# Patient Record
Sex: Male | Born: 1945
Health system: Southern US, Community
[De-identification: ages and names within clinical notes are randomized; demographics above are authoritative.]

## PROBLEM LIST (undated history)

## (undated) DIAGNOSIS — F32A Depression, unspecified: Secondary | ICD-10-CM

## (undated) DIAGNOSIS — I10 Essential (primary) hypertension: Secondary | ICD-10-CM

## (undated) DIAGNOSIS — J449 Chronic obstructive pulmonary disease, unspecified: Secondary | ICD-10-CM

## (undated) DIAGNOSIS — I255 Ischemic cardiomyopathy: Secondary | ICD-10-CM

## (undated) DIAGNOSIS — F329 Major depressive disorder, single episode, unspecified: Secondary | ICD-10-CM

## (undated) DIAGNOSIS — E119 Type 2 diabetes mellitus without complications: Secondary | ICD-10-CM

## (undated) DIAGNOSIS — Z951 Presence of aortocoronary bypass graft: Secondary | ICD-10-CM

## (undated) DIAGNOSIS — J90 Pleural effusion, not elsewhere classified: Secondary | ICD-10-CM

## (undated) DIAGNOSIS — I2510 Atherosclerotic heart disease of native coronary artery without angina pectoris: Secondary | ICD-10-CM

## (undated) HISTORY — PX: APPENDECTOMY: SHX54

## (undated) HISTORY — PX: CHOLECYSTECTOMY: SHX55

## (undated) HISTORY — PX: GASTRIC BYPASS: SHX52

## (undated) HISTORY — PX: TOE AMPUTATION: SHX809

## (undated) HISTORY — PX: CORONARY ARTERY BYPASS GRAFT: SHX141

---

## 1999-09-18 ENCOUNTER — Ambulatory Visit (HOSPITAL_BASED_OUTPATIENT_CLINIC_OR_DEPARTMENT_OTHER): Admission: RE | Admit: 1999-09-18 | Discharge: 1999-09-18 | Payer: Self-pay | Admitting: *Deleted

## 2000-01-22 ENCOUNTER — Ambulatory Visit (HOSPITAL_BASED_OUTPATIENT_CLINIC_OR_DEPARTMENT_OTHER): Admission: RE | Admit: 2000-01-22 | Discharge: 2000-01-22 | Payer: Self-pay | Admitting: Plastic Surgery

## 2000-10-14 ENCOUNTER — Encounter: Admission: RE | Admit: 2000-10-14 | Discharge: 2000-10-14 | Payer: Self-pay | Admitting: *Deleted

## 2000-10-14 ENCOUNTER — Encounter: Payer: Self-pay | Admitting: *Deleted

## 2002-11-02 ENCOUNTER — Encounter: Payer: Self-pay | Admitting: Urology

## 2002-11-02 ENCOUNTER — Ambulatory Visit (HOSPITAL_BASED_OUTPATIENT_CLINIC_OR_DEPARTMENT_OTHER): Admission: RE | Admit: 2002-11-02 | Discharge: 2002-11-02 | Payer: Self-pay | Admitting: Urology

## 2007-09-10 DIAGNOSIS — I2510 Atherosclerotic heart disease of native coronary artery without angina pectoris: Secondary | ICD-10-CM

## 2007-09-10 HISTORY — DX: Atherosclerotic heart disease of native coronary artery without angina pectoris: I25.10

## 2014-07-06 DIAGNOSIS — Z860100 Personal history of colon polyps, unspecified: Secondary | ICD-10-CM | POA: Insufficient documentation

## 2014-07-06 DIAGNOSIS — K760 Fatty (change of) liver, not elsewhere classified: Secondary | ICD-10-CM | POA: Insufficient documentation

## 2014-07-06 DIAGNOSIS — G4700 Insomnia, unspecified: Secondary | ICD-10-CM | POA: Insufficient documentation

## 2014-07-06 DIAGNOSIS — I10 Essential (primary) hypertension: Secondary | ICD-10-CM | POA: Insufficient documentation

## 2014-07-06 DIAGNOSIS — K219 Gastro-esophageal reflux disease without esophagitis: Secondary | ICD-10-CM | POA: Insufficient documentation

## 2014-07-06 DIAGNOSIS — M2040 Other hammer toe(s) (acquired), unspecified foot: Secondary | ICD-10-CM | POA: Insufficient documentation

## 2014-07-06 DIAGNOSIS — I2510 Atherosclerotic heart disease of native coronary artery without angina pectoris: Secondary | ICD-10-CM | POA: Insufficient documentation

## 2014-07-06 DIAGNOSIS — Z9884 Bariatric surgery status: Secondary | ICD-10-CM | POA: Insufficient documentation

## 2014-07-07 DIAGNOSIS — F339 Major depressive disorder, recurrent, unspecified: Secondary | ICD-10-CM | POA: Insufficient documentation

## 2014-07-07 DIAGNOSIS — E119 Type 2 diabetes mellitus without complications: Secondary | ICD-10-CM | POA: Insufficient documentation

## 2014-07-07 DIAGNOSIS — E291 Testicular hypofunction: Secondary | ICD-10-CM | POA: Insufficient documentation

## 2014-07-07 DIAGNOSIS — E113499 Type 2 diabetes mellitus with severe nonproliferative diabetic retinopathy without macular edema, unspecified eye: Secondary | ICD-10-CM | POA: Insufficient documentation

## 2014-07-07 DIAGNOSIS — M1990 Unspecified osteoarthritis, unspecified site: Secondary | ICD-10-CM | POA: Insufficient documentation

## 2014-07-07 DIAGNOSIS — R809 Proteinuria, unspecified: Secondary | ICD-10-CM

## 2014-07-07 DIAGNOSIS — D509 Iron deficiency anemia, unspecified: Secondary | ICD-10-CM | POA: Insufficient documentation

## 2014-07-07 DIAGNOSIS — E1142 Type 2 diabetes mellitus with diabetic polyneuropathy: Secondary | ICD-10-CM | POA: Insufficient documentation

## 2014-07-07 DIAGNOSIS — E1129 Type 2 diabetes mellitus with other diabetic kidney complication: Secondary | ICD-10-CM | POA: Insufficient documentation

## 2014-07-07 DIAGNOSIS — I739 Peripheral vascular disease, unspecified: Secondary | ICD-10-CM | POA: Insufficient documentation

## 2014-07-07 DIAGNOSIS — Z89439 Acquired absence of unspecified foot: Secondary | ICD-10-CM | POA: Insufficient documentation

## 2015-05-12 DIAGNOSIS — E785 Hyperlipidemia, unspecified: Secondary | ICD-10-CM | POA: Insufficient documentation

## 2016-01-18 DIAGNOSIS — M6702 Short Achilles tendon (acquired), left ankle: Secondary | ICD-10-CM | POA: Insufficient documentation

## 2016-05-16 DIAGNOSIS — L97509 Non-pressure chronic ulcer of other part of unspecified foot with unspecified severity: Secondary | ICD-10-CM

## 2016-05-16 DIAGNOSIS — E11621 Type 2 diabetes mellitus with foot ulcer: Secondary | ICD-10-CM | POA: Insufficient documentation

## 2016-09-11 DIAGNOSIS — M2022 Hallux rigidus, left foot: Secondary | ICD-10-CM | POA: Insufficient documentation

## 2016-12-11 DIAGNOSIS — M216X2 Other acquired deformities of left foot: Secondary | ICD-10-CM | POA: Insufficient documentation

## 2017-04-28 DIAGNOSIS — Z951 Presence of aortocoronary bypass graft: Secondary | ICD-10-CM | POA: Insufficient documentation

## 2017-08-30 DIAGNOSIS — F411 Generalized anxiety disorder: Secondary | ICD-10-CM | POA: Diagnosis not present

## 2017-09-03 DIAGNOSIS — E559 Vitamin D deficiency, unspecified: Secondary | ICD-10-CM | POA: Diagnosis not present

## 2017-09-03 DIAGNOSIS — E291 Testicular hypofunction: Secondary | ICD-10-CM | POA: Diagnosis not present

## 2017-09-03 DIAGNOSIS — E119 Type 2 diabetes mellitus without complications: Secondary | ICD-10-CM | POA: Diagnosis not present

## 2017-09-05 DIAGNOSIS — E1142 Type 2 diabetes mellitus with diabetic polyneuropathy: Secondary | ICD-10-CM | POA: Diagnosis not present

## 2017-09-05 DIAGNOSIS — E113499 Type 2 diabetes mellitus with severe nonproliferative diabetic retinopathy without macular edema, unspecified eye: Secondary | ICD-10-CM | POA: Diagnosis not present

## 2017-09-05 DIAGNOSIS — E1129 Type 2 diabetes mellitus with other diabetic kidney complication: Secondary | ICD-10-CM | POA: Diagnosis not present

## 2017-09-05 DIAGNOSIS — Z23 Encounter for immunization: Secondary | ICD-10-CM | POA: Diagnosis not present

## 2017-09-05 DIAGNOSIS — I2510 Atherosclerotic heart disease of native coronary artery without angina pectoris: Secondary | ICD-10-CM | POA: Diagnosis not present

## 2017-09-09 DIAGNOSIS — E113513 Type 2 diabetes mellitus with proliferative diabetic retinopathy with macular edema, bilateral: Secondary | ICD-10-CM | POA: Diagnosis not present

## 2017-10-01 DIAGNOSIS — H43811 Vitreous degeneration, right eye: Secondary | ICD-10-CM | POA: Diagnosis not present

## 2017-10-01 DIAGNOSIS — H4313 Vitreous hemorrhage, bilateral: Secondary | ICD-10-CM | POA: Diagnosis not present

## 2017-10-01 DIAGNOSIS — H35373 Puckering of macula, bilateral: Secondary | ICD-10-CM | POA: Diagnosis not present

## 2017-10-01 DIAGNOSIS — E113513 Type 2 diabetes mellitus with proliferative diabetic retinopathy with macular edema, bilateral: Secondary | ICD-10-CM | POA: Diagnosis not present

## 2017-10-08 DIAGNOSIS — H4313 Vitreous hemorrhage, bilateral: Secondary | ICD-10-CM | POA: Diagnosis not present

## 2017-10-08 DIAGNOSIS — H35373 Puckering of macula, bilateral: Secondary | ICD-10-CM | POA: Diagnosis not present

## 2017-10-08 DIAGNOSIS — E113513 Type 2 diabetes mellitus with proliferative diabetic retinopathy with macular edema, bilateral: Secondary | ICD-10-CM | POA: Diagnosis not present

## 2017-10-17 DIAGNOSIS — H3589 Other specified retinal disorders: Secondary | ICD-10-CM | POA: Diagnosis not present

## 2017-10-17 DIAGNOSIS — H35372 Puckering of macula, left eye: Secondary | ICD-10-CM | POA: Diagnosis not present

## 2017-10-17 DIAGNOSIS — E113513 Type 2 diabetes mellitus with proliferative diabetic retinopathy with macular edema, bilateral: Secondary | ICD-10-CM | POA: Diagnosis not present

## 2017-10-17 DIAGNOSIS — H35353 Cystoid macular degeneration, bilateral: Secondary | ICD-10-CM | POA: Diagnosis not present

## 2017-10-17 DIAGNOSIS — E113512 Type 2 diabetes mellitus with proliferative diabetic retinopathy with macular edema, left eye: Secondary | ICD-10-CM | POA: Diagnosis not present

## 2017-10-22 DIAGNOSIS — Z79899 Other long term (current) drug therapy: Secondary | ICD-10-CM | POA: Diagnosis not present

## 2017-10-22 DIAGNOSIS — M7742 Metatarsalgia, left foot: Secondary | ICD-10-CM | POA: Diagnosis not present

## 2017-10-22 DIAGNOSIS — D539 Nutritional anemia, unspecified: Secondary | ICD-10-CM | POA: Diagnosis not present

## 2017-10-22 DIAGNOSIS — L639 Alopecia areata, unspecified: Secondary | ICD-10-CM | POA: Diagnosis not present

## 2017-10-22 DIAGNOSIS — M6702 Short Achilles tendon (acquired), left ankle: Secondary | ICD-10-CM | POA: Diagnosis not present

## 2017-10-22 DIAGNOSIS — L089 Local infection of the skin and subcutaneous tissue, unspecified: Secondary | ICD-10-CM | POA: Diagnosis not present

## 2017-10-22 DIAGNOSIS — L309 Dermatitis, unspecified: Secondary | ICD-10-CM | POA: Diagnosis not present

## 2017-10-22 DIAGNOSIS — L84 Corns and callosities: Secondary | ICD-10-CM | POA: Diagnosis not present

## 2017-10-22 DIAGNOSIS — Z5181 Encounter for therapeutic drug level monitoring: Secondary | ICD-10-CM | POA: Diagnosis not present

## 2017-10-22 DIAGNOSIS — M7741 Metatarsalgia, right foot: Secondary | ICD-10-CM | POA: Diagnosis not present

## 2017-10-23 DIAGNOSIS — L84 Corns and callosities: Secondary | ICD-10-CM | POA: Insufficient documentation

## 2017-10-23 DIAGNOSIS — M7742 Metatarsalgia, left foot: Secondary | ICD-10-CM

## 2017-10-23 DIAGNOSIS — M7741 Metatarsalgia, right foot: Secondary | ICD-10-CM | POA: Insufficient documentation

## 2017-11-25 DIAGNOSIS — F411 Generalized anxiety disorder: Secondary | ICD-10-CM | POA: Diagnosis not present

## 2017-12-16 DIAGNOSIS — B9689 Other specified bacterial agents as the cause of diseases classified elsewhere: Secondary | ICD-10-CM | POA: Diagnosis not present

## 2017-12-16 DIAGNOSIS — R05 Cough: Secondary | ICD-10-CM | POA: Diagnosis not present

## 2017-12-16 DIAGNOSIS — E119 Type 2 diabetes mellitus without complications: Secondary | ICD-10-CM | POA: Diagnosis not present

## 2017-12-16 DIAGNOSIS — J209 Acute bronchitis, unspecified: Secondary | ICD-10-CM | POA: Diagnosis not present

## 2017-12-16 DIAGNOSIS — R918 Other nonspecific abnormal finding of lung field: Secondary | ICD-10-CM | POA: Diagnosis not present

## 2017-12-16 DIAGNOSIS — J9801 Acute bronchospasm: Secondary | ICD-10-CM | POA: Diagnosis not present

## 2017-12-16 DIAGNOSIS — I493 Ventricular premature depolarization: Secondary | ICD-10-CM | POA: Diagnosis not present

## 2017-12-16 DIAGNOSIS — R000 Tachycardia, unspecified: Secondary | ICD-10-CM | POA: Diagnosis not present

## 2017-12-16 DIAGNOSIS — I4589 Other specified conduction disorders: Secondary | ICD-10-CM | POA: Diagnosis not present

## 2017-12-16 DIAGNOSIS — I2510 Atherosclerotic heart disease of native coronary artery without angina pectoris: Secondary | ICD-10-CM | POA: Diagnosis not present

## 2017-12-16 DIAGNOSIS — I454 Nonspecific intraventricular block: Secondary | ICD-10-CM | POA: Diagnosis not present

## 2017-12-16 DIAGNOSIS — R0989 Other specified symptoms and signs involving the circulatory and respiratory systems: Secondary | ICD-10-CM | POA: Diagnosis not present

## 2017-12-16 DIAGNOSIS — Z951 Presence of aortocoronary bypass graft: Secondary | ICD-10-CM | POA: Diagnosis not present

## 2017-12-16 DIAGNOSIS — E113593 Type 2 diabetes mellitus with proliferative diabetic retinopathy without macular edema, bilateral: Secondary | ICD-10-CM | POA: Diagnosis not present

## 2017-12-16 DIAGNOSIS — F329 Major depressive disorder, single episode, unspecified: Secondary | ICD-10-CM | POA: Diagnosis not present

## 2017-12-16 DIAGNOSIS — Z79899 Other long term (current) drug therapy: Secondary | ICD-10-CM | POA: Diagnosis not present

## 2017-12-16 DIAGNOSIS — I10 Essential (primary) hypertension: Secondary | ICD-10-CM | POA: Diagnosis not present

## 2017-12-16 DIAGNOSIS — R0602 Shortness of breath: Secondary | ICD-10-CM | POA: Diagnosis not present

## 2017-12-16 DIAGNOSIS — Z9884 Bariatric surgery status: Secondary | ICD-10-CM | POA: Diagnosis not present

## 2017-12-16 DIAGNOSIS — Z7984 Long term (current) use of oral hypoglycemic drugs: Secondary | ICD-10-CM | POA: Diagnosis not present

## 2017-12-16 DIAGNOSIS — I25810 Atherosclerosis of coronary artery bypass graft(s) without angina pectoris: Secondary | ICD-10-CM | POA: Diagnosis not present

## 2017-12-16 DIAGNOSIS — E7800 Pure hypercholesterolemia, unspecified: Secondary | ICD-10-CM | POA: Diagnosis not present

## 2017-12-16 DIAGNOSIS — R7989 Other specified abnormal findings of blood chemistry: Secondary | ICD-10-CM | POA: Diagnosis not present

## 2017-12-16 DIAGNOSIS — R748 Abnormal levels of other serum enzymes: Secondary | ICD-10-CM | POA: Diagnosis not present

## 2017-12-16 DIAGNOSIS — Z7982 Long term (current) use of aspirin: Secondary | ICD-10-CM | POA: Diagnosis not present

## 2017-12-16 DIAGNOSIS — R778 Other specified abnormalities of plasma proteins: Secondary | ICD-10-CM | POA: Diagnosis not present

## 2017-12-16 DIAGNOSIS — J40 Bronchitis, not specified as acute or chronic: Secondary | ICD-10-CM | POA: Diagnosis not present

## 2017-12-16 DIAGNOSIS — I252 Old myocardial infarction: Secondary | ICD-10-CM | POA: Diagnosis not present

## 2017-12-16 DIAGNOSIS — J189 Pneumonia, unspecified organism: Secondary | ICD-10-CM | POA: Diagnosis not present

## 2017-12-16 DIAGNOSIS — R079 Chest pain, unspecified: Secondary | ICD-10-CM | POA: Diagnosis not present

## 2017-12-25 DIAGNOSIS — J189 Pneumonia, unspecified organism: Secondary | ICD-10-CM | POA: Diagnosis not present

## 2017-12-25 DIAGNOSIS — R918 Other nonspecific abnormal finding of lung field: Secondary | ICD-10-CM | POA: Diagnosis not present

## 2017-12-25 DIAGNOSIS — J9801 Acute bronchospasm: Secondary | ICD-10-CM | POA: Diagnosis not present

## 2017-12-25 DIAGNOSIS — R05 Cough: Secondary | ICD-10-CM | POA: Diagnosis not present

## 2018-01-01 DIAGNOSIS — J189 Pneumonia, unspecified organism: Secondary | ICD-10-CM | POA: Diagnosis not present

## 2018-01-01 DIAGNOSIS — J9801 Acute bronchospasm: Secondary | ICD-10-CM | POA: Diagnosis not present

## 2018-01-01 DIAGNOSIS — E119 Type 2 diabetes mellitus without complications: Secondary | ICD-10-CM | POA: Diagnosis not present

## 2018-01-06 DIAGNOSIS — E1142 Type 2 diabetes mellitus with diabetic polyneuropathy: Secondary | ICD-10-CM | POA: Diagnosis not present

## 2018-01-06 DIAGNOSIS — E119 Type 2 diabetes mellitus without complications: Secondary | ICD-10-CM | POA: Diagnosis not present

## 2018-01-06 DIAGNOSIS — I2510 Atherosclerotic heart disease of native coronary artery without angina pectoris: Secondary | ICD-10-CM | POA: Diagnosis not present

## 2018-01-06 DIAGNOSIS — I10 Essential (primary) hypertension: Secondary | ICD-10-CM | POA: Diagnosis not present

## 2018-01-07 DIAGNOSIS — H35353 Cystoid macular degeneration, bilateral: Secondary | ICD-10-CM | POA: Diagnosis not present

## 2018-01-07 DIAGNOSIS — T378X5D Adverse effect of other specified systemic anti-infectives and antiparasitics, subsequent encounter: Secondary | ICD-10-CM | POA: Diagnosis not present

## 2018-01-07 DIAGNOSIS — H35372 Puckering of macula, left eye: Secondary | ICD-10-CM | POA: Diagnosis not present

## 2018-01-07 DIAGNOSIS — H43813 Vitreous degeneration, bilateral: Secondary | ICD-10-CM | POA: Diagnosis not present

## 2018-01-08 DIAGNOSIS — R05 Cough: Secondary | ICD-10-CM | POA: Diagnosis not present

## 2018-01-08 DIAGNOSIS — J189 Pneumonia, unspecified organism: Secondary | ICD-10-CM | POA: Diagnosis not present

## 2018-01-15 DIAGNOSIS — I10 Essential (primary) hypertension: Secondary | ICD-10-CM | POA: Diagnosis not present

## 2018-01-15 DIAGNOSIS — I2510 Atherosclerotic heart disease of native coronary artery without angina pectoris: Secondary | ICD-10-CM | POA: Diagnosis not present

## 2018-01-15 DIAGNOSIS — E785 Hyperlipidemia, unspecified: Secondary | ICD-10-CM | POA: Diagnosis not present

## 2018-01-15 DIAGNOSIS — Z951 Presence of aortocoronary bypass graft: Secondary | ICD-10-CM | POA: Diagnosis not present

## 2018-01-28 DIAGNOSIS — L639 Alopecia areata, unspecified: Secondary | ICD-10-CM | POA: Diagnosis not present

## 2018-01-28 DIAGNOSIS — Z79899 Other long term (current) drug therapy: Secondary | ICD-10-CM | POA: Diagnosis not present

## 2018-01-28 DIAGNOSIS — Z5181 Encounter for therapeutic drug level monitoring: Secondary | ICD-10-CM | POA: Diagnosis not present

## 2018-01-28 DIAGNOSIS — L089 Local infection of the skin and subcutaneous tissue, unspecified: Secondary | ICD-10-CM | POA: Diagnosis not present

## 2018-02-17 DIAGNOSIS — F411 Generalized anxiety disorder: Secondary | ICD-10-CM | POA: Diagnosis not present

## 2018-02-18 DIAGNOSIS — R509 Fever, unspecified: Secondary | ICD-10-CM | POA: Diagnosis not present

## 2018-02-18 DIAGNOSIS — R05 Cough: Secondary | ICD-10-CM | POA: Diagnosis not present

## 2018-02-18 DIAGNOSIS — R6889 Other general symptoms and signs: Secondary | ICD-10-CM | POA: Diagnosis not present

## 2018-02-18 DIAGNOSIS — J3489 Other specified disorders of nose and nasal sinuses: Secondary | ICD-10-CM | POA: Diagnosis not present

## 2018-02-18 DIAGNOSIS — R52 Pain, unspecified: Secondary | ICD-10-CM | POA: Diagnosis not present

## 2018-03-03 DIAGNOSIS — M2042 Other hammer toe(s) (acquired), left foot: Secondary | ICD-10-CM | POA: Diagnosis not present

## 2018-03-03 DIAGNOSIS — M216X2 Other acquired deformities of left foot: Secondary | ICD-10-CM | POA: Diagnosis not present

## 2018-03-03 DIAGNOSIS — M6702 Short Achilles tendon (acquired), left ankle: Secondary | ICD-10-CM | POA: Diagnosis not present

## 2018-03-03 DIAGNOSIS — L97521 Non-pressure chronic ulcer of other part of left foot limited to breakdown of skin: Secondary | ICD-10-CM | POA: Diagnosis not present

## 2018-03-18 DIAGNOSIS — L97521 Non-pressure chronic ulcer of other part of left foot limited to breakdown of skin: Secondary | ICD-10-CM | POA: Diagnosis not present

## 2018-03-18 DIAGNOSIS — M6702 Short Achilles tendon (acquired), left ankle: Secondary | ICD-10-CM | POA: Diagnosis not present

## 2018-03-25 DIAGNOSIS — L089 Local infection of the skin and subcutaneous tissue, unspecified: Secondary | ICD-10-CM | POA: Diagnosis not present

## 2018-03-25 DIAGNOSIS — Z5181 Encounter for therapeutic drug level monitoring: Secondary | ICD-10-CM | POA: Diagnosis not present

## 2018-03-25 DIAGNOSIS — Z79899 Other long term (current) drug therapy: Secondary | ICD-10-CM | POA: Diagnosis not present

## 2018-03-25 DIAGNOSIS — L639 Alopecia areata, unspecified: Secondary | ICD-10-CM | POA: Diagnosis not present

## 2018-04-08 DIAGNOSIS — H35373 Puckering of macula, bilateral: Secondary | ICD-10-CM | POA: Diagnosis not present

## 2018-04-08 DIAGNOSIS — E113512 Type 2 diabetes mellitus with proliferative diabetic retinopathy with macular edema, left eye: Secondary | ICD-10-CM | POA: Diagnosis not present

## 2018-04-08 DIAGNOSIS — E113513 Type 2 diabetes mellitus with proliferative diabetic retinopathy with macular edema, bilateral: Secondary | ICD-10-CM | POA: Diagnosis not present

## 2018-04-18 DIAGNOSIS — R0989 Other specified symptoms and signs involving the circulatory and respiratory systems: Secondary | ICD-10-CM | POA: Diagnosis not present

## 2018-04-18 DIAGNOSIS — Z951 Presence of aortocoronary bypass graft: Secondary | ICD-10-CM | POA: Diagnosis not present

## 2018-04-18 DIAGNOSIS — R252 Cramp and spasm: Secondary | ICD-10-CM | POA: Diagnosis not present

## 2018-04-18 DIAGNOSIS — R0600 Dyspnea, unspecified: Secondary | ICD-10-CM | POA: Diagnosis not present

## 2018-04-18 DIAGNOSIS — R918 Other nonspecific abnormal finding of lung field: Secondary | ICD-10-CM | POA: Diagnosis not present

## 2018-04-18 DIAGNOSIS — R0609 Other forms of dyspnea: Secondary | ICD-10-CM | POA: Diagnosis not present

## 2018-04-21 DIAGNOSIS — D649 Anemia, unspecified: Secondary | ICD-10-CM | POA: Insufficient documentation

## 2018-04-28 ENCOUNTER — Encounter (HOSPITAL_BASED_OUTPATIENT_CLINIC_OR_DEPARTMENT_OTHER): Payer: Self-pay | Admitting: Emergency Medicine

## 2018-04-28 ENCOUNTER — Other Ambulatory Visit: Payer: Self-pay

## 2018-04-28 ENCOUNTER — Emergency Department (HOSPITAL_BASED_OUTPATIENT_CLINIC_OR_DEPARTMENT_OTHER): Payer: BLUE CROSS/BLUE SHIELD

## 2018-04-28 ENCOUNTER — Inpatient Hospital Stay (HOSPITAL_BASED_OUTPATIENT_CLINIC_OR_DEPARTMENT_OTHER)
Admission: EM | Admit: 2018-04-28 | Discharge: 2018-05-01 | DRG: 286 | Disposition: A | Discharge status: Home / Self Care | Payer: BLUE CROSS/BLUE SHIELD | Attending: Cardiovascular Disease | Admitting: Cardiovascular Disease

## 2018-04-28 DIAGNOSIS — Z951 Presence of aortocoronary bypass graft: Secondary | ICD-10-CM | POA: Diagnosis not present

## 2018-04-28 DIAGNOSIS — F329 Major depressive disorder, single episode, unspecified: Secondary | ICD-10-CM | POA: Diagnosis not present

## 2018-04-28 DIAGNOSIS — G3184 Mild cognitive impairment, so stated: Secondary | ICD-10-CM | POA: Diagnosis not present

## 2018-04-28 DIAGNOSIS — I5021 Acute systolic (congestive) heart failure: Secondary | ICD-10-CM | POA: Diagnosis present

## 2018-04-28 DIAGNOSIS — I255 Ischemic cardiomyopathy: Secondary | ICD-10-CM

## 2018-04-28 DIAGNOSIS — I248 Other forms of acute ischemic heart disease: Secondary | ICD-10-CM | POA: Diagnosis present

## 2018-04-28 DIAGNOSIS — Z888 Allergy status to other drugs, medicaments and biological substances status: Secondary | ICD-10-CM | POA: Diagnosis not present

## 2018-04-28 DIAGNOSIS — F17200 Nicotine dependence, unspecified, uncomplicated: Secondary | ICD-10-CM | POA: Diagnosis present

## 2018-04-28 DIAGNOSIS — I110 Hypertensive heart disease with heart failure: Principal | ICD-10-CM | POA: Diagnosis present

## 2018-04-28 DIAGNOSIS — Z7984 Long term (current) use of oral hypoglycemic drugs: Secondary | ICD-10-CM

## 2018-04-28 DIAGNOSIS — I351 Nonrheumatic aortic (valve) insufficiency: Secondary | ICD-10-CM | POA: Diagnosis not present

## 2018-04-28 DIAGNOSIS — Z8249 Family history of ischemic heart disease and other diseases of the circulatory system: Secondary | ICD-10-CM | POA: Diagnosis not present

## 2018-04-28 DIAGNOSIS — I2510 Atherosclerotic heart disease of native coronary artery without angina pectoris: Secondary | ICD-10-CM | POA: Diagnosis present

## 2018-04-28 DIAGNOSIS — R0902 Hypoxemia: Secondary | ICD-10-CM | POA: Diagnosis not present

## 2018-04-28 DIAGNOSIS — E119 Type 2 diabetes mellitus without complications: Secondary | ICD-10-CM | POA: Diagnosis not present

## 2018-04-28 DIAGNOSIS — K219 Gastro-esophageal reflux disease without esophagitis: Secondary | ICD-10-CM | POA: Diagnosis present

## 2018-04-28 DIAGNOSIS — Z7982 Long term (current) use of aspirin: Secondary | ICD-10-CM

## 2018-04-28 DIAGNOSIS — D649 Anemia, unspecified: Secondary | ICD-10-CM | POA: Diagnosis not present

## 2018-04-28 DIAGNOSIS — E1165 Type 2 diabetes mellitus with hyperglycemia: Secondary | ICD-10-CM | POA: Diagnosis present

## 2018-04-28 DIAGNOSIS — Z9884 Bariatric surgery status: Secondary | ICD-10-CM

## 2018-04-28 DIAGNOSIS — L659 Nonscarring hair loss, unspecified: Secondary | ICD-10-CM | POA: Diagnosis not present

## 2018-04-28 DIAGNOSIS — Z79899 Other long term (current) drug therapy: Secondary | ICD-10-CM | POA: Diagnosis not present

## 2018-04-28 DIAGNOSIS — E785 Hyperlipidemia, unspecified: Secondary | ICD-10-CM | POA: Diagnosis not present

## 2018-04-28 DIAGNOSIS — I25119 Atherosclerotic heart disease of native coronary artery with unspecified angina pectoris: Secondary | ICD-10-CM | POA: Diagnosis present

## 2018-04-28 DIAGNOSIS — F419 Anxiety disorder, unspecified: Secondary | ICD-10-CM | POA: Diagnosis present

## 2018-04-28 DIAGNOSIS — R079 Chest pain, unspecified: Secondary | ICD-10-CM | POA: Diagnosis not present

## 2018-04-28 DIAGNOSIS — I5023 Acute on chronic systolic (congestive) heart failure: Secondary | ICD-10-CM | POA: Diagnosis not present

## 2018-04-28 DIAGNOSIS — I509 Heart failure, unspecified: Secondary | ICD-10-CM

## 2018-04-28 DIAGNOSIS — J810 Acute pulmonary edema: Secondary | ICD-10-CM

## 2018-04-28 DIAGNOSIS — R0602 Shortness of breath: Secondary | ICD-10-CM

## 2018-04-28 DIAGNOSIS — J90 Pleural effusion, not elsewhere classified: Secondary | ICD-10-CM | POA: Diagnosis not present

## 2018-04-28 DIAGNOSIS — R000 Tachycardia, unspecified: Secondary | ICD-10-CM | POA: Diagnosis not present

## 2018-04-28 DIAGNOSIS — R9431 Abnormal electrocardiogram [ECG] [EKG]: Secondary | ICD-10-CM | POA: Diagnosis not present

## 2018-04-28 DIAGNOSIS — R748 Abnormal levels of other serum enzymes: Secondary | ICD-10-CM | POA: Diagnosis not present

## 2018-04-28 DIAGNOSIS — I10 Essential (primary) hypertension: Secondary | ICD-10-CM | POA: Diagnosis not present

## 2018-04-28 DIAGNOSIS — F32A Depression, unspecified: Secondary | ICD-10-CM | POA: Diagnosis present

## 2018-04-28 HISTORY — DX: Depression, unspecified: F32.A

## 2018-04-28 HISTORY — DX: Atherosclerotic heart disease of native coronary artery without angina pectoris: I25.10

## 2018-04-28 HISTORY — DX: Major depressive disorder, single episode, unspecified: F32.9

## 2018-04-28 HISTORY — DX: Type 2 diabetes mellitus without complications: E11.9

## 2018-04-28 HISTORY — DX: Chronic obstructive pulmonary disease, unspecified: J44.9

## 2018-04-28 HISTORY — DX: Essential (primary) hypertension: I10

## 2018-04-28 HISTORY — DX: Presence of aortocoronary bypass graft: Z95.1

## 2018-04-28 HISTORY — DX: Ischemic cardiomyopathy: I25.5

## 2018-04-28 HISTORY — DX: Pleural effusion, not elsewhere classified: J90

## 2018-04-28 LAB — CBC WITH DIFFERENTIAL/PLATELET
BASOS ABS: 0 10*3/uL (ref 0.0–0.1)
Basophils Relative: 1 %
Eosinophils Absolute: 0.2 10*3/uL (ref 0.0–0.7)
Eosinophils Relative: 3 %
HEMATOCRIT: 32.3 % — AB (ref 39.0–52.0)
Hemoglobin: 10.3 g/dL — ABNORMAL LOW (ref 13.0–17.0)
LYMPHS ABS: 0.9 10*3/uL (ref 0.7–4.0)
LYMPHS PCT: 14 %
MCH: 27.8 pg (ref 26.0–34.0)
MCHC: 31.9 g/dL (ref 30.0–36.0)
MCV: 87.1 fL (ref 78.0–100.0)
MONO ABS: 0.5 10*3/uL (ref 0.1–1.0)
MONOS PCT: 8 %
NEUTROS ABS: 4.9 10*3/uL (ref 1.7–7.7)
Neutrophils Relative %: 74 %
Platelets: 275 10*3/uL (ref 150–400)
RBC: 3.71 MIL/uL — ABNORMAL LOW (ref 4.22–5.81)
RDW: 14.7 % (ref 11.5–15.5)
WBC: 6.5 10*3/uL (ref 4.0–10.5)

## 2018-04-28 LAB — TSH: TSH: 3.571 u[IU]/mL (ref 0.350–4.500)

## 2018-04-28 LAB — GLUCOSE, CAPILLARY: GLUCOSE-CAPILLARY: 226 mg/dL — AB (ref 65–99)

## 2018-04-28 LAB — BASIC METABOLIC PANEL
Anion gap: 10 (ref 5–15)
BUN: 24 mg/dL — ABNORMAL HIGH (ref 6–20)
CO2: 25 mmol/L (ref 22–32)
Calcium: 8.7 mg/dL — ABNORMAL LOW (ref 8.9–10.3)
Chloride: 100 mmol/L — ABNORMAL LOW (ref 101–111)
Creatinine, Ser: 0.97 mg/dL (ref 0.61–1.24)
GFR calc Af Amer: >60 mL/min (ref >60)
GFR calc non Af Amer: >60 mL/min (ref >60)
Glucose, Bld: 302 mg/dL — ABNORMAL HIGH (ref 65–99)
Potassium: 4.7 mmol/L (ref 3.5–5.1)
Sodium: 135 mmol/L (ref 135–145)

## 2018-04-28 LAB — TROPONIN I
TROPONIN I: 0.06 ng/mL — AB (ref <0.03)
Troponin I: 0.04 ng/mL (ref <0.03)
Troponin I: 0.05 ng/mL (ref <0.03)

## 2018-04-28 LAB — MAGNESIUM: Magnesium: 1.9 mg/dL (ref 1.7–2.4)

## 2018-04-28 LAB — BRAIN NATRIURETIC PEPTIDE: B Natriuretic Peptide: 623.2 pg/mL — ABNORMAL HIGH (ref 0.0–100.0)

## 2018-04-28 MED ORDER — ATORVASTATIN CALCIUM 20 MG PO TABS
20.0000 mg | ORAL_TABLET | Freq: Every day | ORAL | Status: DC
  Administered 2018-04-29 – 2018-05-01 (×3): 20 mg via ORAL
  Filled 2018-04-28 (×3): qty 1

## 2018-04-28 MED ORDER — AMMONIUM LACTATE 12 % EX LOTN
TOPICAL_LOTION | Freq: Two times a day (BID) | CUTANEOUS | Status: DC | PRN
  Filled 2018-04-28: qty 225

## 2018-04-28 MED ORDER — INSULIN ASPART 100 UNIT/ML ~~LOC~~ SOLN
0.0000 [IU] | Freq: Three times a day (TID) | SUBCUTANEOUS | Status: DC
Start: 2018-04-29 — End: 2018-05-01
  Administered 2018-04-29: 5 [IU] via SUBCUTANEOUS
  Administered 2018-04-29 – 2018-05-01 (×4): 3 [IU] via SUBCUTANEOUS

## 2018-04-28 MED ORDER — ZOLPIDEM TARTRATE 5 MG PO TABS
5.0000 mg | ORAL_TABLET | Freq: Every evening | ORAL | Status: DC | PRN
  Administered 2018-04-28 – 2018-04-30 (×3): 5 mg via ORAL
  Filled 2018-04-28 (×4): qty 1

## 2018-04-28 MED ORDER — ACETAMINOPHEN 325 MG PO TABS: 650.0000 mg | ORAL_TABLET | ORAL | Status: DC | PRN

## 2018-04-28 MED ORDER — SODIUM CHLORIDE 0.9 % IV SOLN: 250.0000 mL | INTRAVENOUS | Status: DC | PRN

## 2018-04-28 MED ORDER — INSULIN DETEMIR 100 UNIT/ML ~~LOC~~ SOLN
5.0000 [IU] | Freq: Every day | SUBCUTANEOUS | Status: DC
  Administered 2018-04-28 – 2018-04-29 (×2): 5 [IU] via SUBCUTANEOUS
  Filled 2018-04-28 (×2): qty 0.05

## 2018-04-28 MED ORDER — FUROSEMIDE 10 MG/ML IJ SOLN
20.0000 mg | Freq: Once | INTRAMUSCULAR | Status: AC
  Administered 2018-04-28: 20 mg via INTRAVENOUS
  Filled 2018-04-28: qty 2

## 2018-04-28 MED ORDER — ONDANSETRON HCL 4 MG/2ML IJ SOLN: 4.0000 mg | Freq: Four times a day (QID) | INTRAMUSCULAR | Status: DC | PRN

## 2018-04-28 MED ORDER — ASPIRIN EC 81 MG PO TBEC
81.0000 mg | DELAYED_RELEASE_TABLET | Freq: Every day | ORAL | Status: DC
  Administered 2018-04-28 – 2018-04-30 (×3): 81 mg via ORAL
  Filled 2018-04-28 (×3): qty 1

## 2018-04-28 MED ORDER — ENOXAPARIN SODIUM 40 MG/0.4ML ~~LOC~~ SOLN
40.0000 mg | SUBCUTANEOUS | Status: DC
  Administered 2018-04-28 – 2018-04-30 (×3): 40 mg via SUBCUTANEOUS
  Filled 2018-04-28 (×3): qty 0.4

## 2018-04-28 MED ORDER — OFLOXACIN 0.3 % OP SOLN
1.0000 [drp] | Freq: Four times a day (QID) | OPHTHALMIC | Status: DC
  Filled 2018-04-28 (×2): qty 5

## 2018-04-28 MED ORDER — PANTOPRAZOLE SODIUM 40 MG PO TBEC
40.0000 mg | DELAYED_RELEASE_TABLET | Freq: Two times a day (BID) | ORAL | Status: DC
  Administered 2018-04-28 – 2018-05-01 (×6): 40 mg via ORAL
  Filled 2018-04-28 (×6): qty 1

## 2018-04-28 MED ORDER — FUROSEMIDE 10 MG/ML IJ SOLN
40.0000 mg | Freq: Two times a day (BID) | INTRAMUSCULAR | Status: DC
  Administered 2018-04-28 – 2018-05-01 (×5): 40 mg via INTRAVENOUS
  Filled 2018-04-28 (×5): qty 4

## 2018-04-28 MED ORDER — SODIUM CHLORIDE 0.9% FLUSH: 3.0000 mL | INTRAVENOUS | Status: DC | PRN

## 2018-04-28 MED ORDER — IPRATROPIUM-ALBUTEROL 0.5-2.5 (3) MG/3ML IN SOLN: 3.0000 mL | RESPIRATORY_TRACT | Status: DC | PRN

## 2018-04-28 MED ORDER — IOPAMIDOL (ISOVUE-370) INJECTION 76%
100.0000 mL | Freq: Once | INTRAVENOUS | Status: AC | PRN
  Administered 2018-04-28: 100 mL via INTRAVENOUS

## 2018-04-28 MED ORDER — SACCHAROMYCES BOULARDII 250 MG PO CAPS
250.0000 mg | ORAL_CAPSULE | Freq: Every day | ORAL | Status: DC
  Administered 2018-04-29 – 2018-05-01 (×3): 250 mg via ORAL
  Filled 2018-04-28 (×3): qty 1

## 2018-04-28 MED ORDER — HYDROXYCHLOROQUINE SULFATE 200 MG PO TABS
200.0000 mg | ORAL_TABLET | Freq: Two times a day (BID) | ORAL | Status: DC
  Administered 2018-04-28: 200 mg via ORAL
  Filled 2018-04-28: qty 1

## 2018-04-28 MED ORDER — FOLIC ACID 1 MG PO TABS
1.0000 mg | ORAL_TABLET | Freq: Every day | ORAL | Status: DC
  Administered 2018-04-29 – 2018-05-01 (×3): 1 mg via ORAL
  Filled 2018-04-28 (×3): qty 1

## 2018-04-28 MED ORDER — FINASTERIDE 5 MG PO TABS
5.0000 mg | ORAL_TABLET | Freq: Every evening | ORAL | Status: DC
  Administered 2018-04-28 – 2018-04-30 (×3): 5 mg via ORAL
  Filled 2018-04-28 (×3): qty 1

## 2018-04-28 MED ORDER — DONEPEZIL HCL 5 MG PO TABS: 5.0000 mg | ORAL_TABLET | Freq: Every day | ORAL | Status: DC

## 2018-04-28 MED ORDER — DULOXETINE HCL 60 MG PO CPEP
60.0000 mg | ORAL_CAPSULE | Freq: Every day | ORAL | Status: DC
  Administered 2018-04-28 – 2018-04-30 (×3): 60 mg via ORAL
  Filled 2018-04-28 (×3): qty 1

## 2018-04-28 MED ORDER — INSULIN ASPART 100 UNIT/ML ~~LOC~~ SOLN
0.0000 [IU] | Freq: Every day | SUBCUTANEOUS | Status: DC
  Administered 2018-04-28 – 2018-04-30 (×2): 2 [IU] via SUBCUTANEOUS

## 2018-04-28 MED ORDER — BUPROPION HCL ER (XL) 150 MG PO TB24
150.0000 mg | ORAL_TABLET | Freq: Every day | ORAL | Status: DC
  Administered 2018-04-29 – 2018-05-01 (×3): 150 mg via ORAL
  Filled 2018-04-28 (×3): qty 1

## 2018-04-28 MED ORDER — SODIUM CHLORIDE 0.9% FLUSH
3.0000 mL | Freq: Two times a day (BID) | INTRAVENOUS | Status: DC
  Administered 2018-04-28 – 2018-04-30 (×5): 3 mL via INTRAVENOUS

## 2018-04-28 MED ORDER — CLONAZEPAM 0.5 MG PO TABS
0.5000 mg | ORAL_TABLET | Freq: Every evening | ORAL | Status: DC | PRN
  Administered 2018-04-28 – 2018-04-30 (×3): 0.5 mg via ORAL
  Filled 2018-04-28 (×3): qty 1

## 2018-04-28 MED ORDER — BUSPIRONE HCL 5 MG PO TABS
15.0000 mg | ORAL_TABLET | Freq: Every day | ORAL | Status: DC
  Administered 2018-04-29 – 2018-05-01 (×3): 15 mg via ORAL
  Filled 2018-04-28: qty 1
  Filled 2018-04-28 (×2): qty 3

## 2018-04-28 NOTE — ED Provider Notes (Signed)
[facility] EMERGENCY DEPARTMENT Provider Note   CSN: [id number] Arrival date & time: 04/28/18  1018     History   Chief Complaint Chief Complaint  Patient presents with  . Shortness of Breath    HPI Alec Taylor is a 72 y.o. male.  HPI 72 year old Caucasian male past medical history significant for hypertension, diabetes, depression presents to the emergency department today for ongoing worsening exertional dyspnea.  Patient states that he was hospitalized in January 2019 for pneumonia.  At that time he also had an elevated troponin of 0.06.  This was felt to be due to demand ischemia and patient had outpatient cardiology follow that was reassuring.  Patient states that since his hospitalization he has had worsening exertional dyspnea.  Patient reports orthopnea which is not abnormal from his baseline.  Denies any nocturnal dyspnea.  Patient reports intermittent chest pain with exertion at times but currently denies any chest pain.  Patient reports swelling in his lower extremities.  Patient has no history of congestive heart failure however his last EF did show a 45%.  Patient not currently on diuretics.  Patient denies any history of DVT/PE, prolonged immobilization, recent hospitalization/surgeries, unilateral leg swelling or calf tenderness, tobacco use.  Denies any COPD or asthma history.  Patient has no remote history of smoking.  Denies any cough or fevers.  Pt denies any fever, chill, ha, vision changes, lightheadedness, dizziness, congestion, neck pain,cough, abd pain, n/v/d, urinary symptoms, change in bowel habits, melena, hematochezia, lower extremity paresthesias.  Past Medical History:  Diagnosis Date  . COPD (chronic obstructive pulmonary disease) (HCC)   . Depression   . Diabetes mellitus without complication (HCC)   . Hypertension     There are no active problems to display for this patient.   Past Surgical History:  Procedure Laterality Date  .  APPENDECTOMY    . CHOLECYSTECTOMY    . CORONARY ARTERY BYPASS GRAFT    . GASTRIC BYPASS          Home Medications    Prior to Admission medications   Not on File    Family History History reviewed. No pertinent family history.  Social History Social History   Tobacco Use  . Smoking status: Current Every Day Smoker  . Smokeless tobacco: Never Used  Substance Use Topics  . Alcohol use: Yes    Comment: occ  . Drug use: Never     Allergies   Altace [ramipril]   Review of Systems Review of Systems  All other systems reviewed and are negative.    Physical Exam Updated Vital Signs BP 140/75 (BP Location: Left Arm)   Pulse 98   Temp 98.4 F (36.9 C) (Oral)   Resp (!) 24   Ht 5\' 10"  (1.778 m)   Wt 86.2 kg (190 lb)   SpO2 95%   BMI 27.26 kg/m   Physical Exam  Constitutional: He is oriented to person, place, and time. He appears well-developed and well-nourished.  Non-toxic appearance. No distress.  HENT:  Head: Normocephalic and atraumatic.  Nose: Nose normal.  Mouth/Throat: Oropharynx is clear and moist.  Eyes: Pupils are equal, round, and reactive to light. Conjunctivae are normal. Right eye exhibits no discharge. Left eye exhibits no discharge.  Neck: Normal range of motion. Neck supple. No JVD present. No tracheal deviation present.  Cardiovascular: Normal rate, regular rhythm, normal heart sounds and intact distal pulses. Exam reveals no gallop and no friction rub.  No murmur heard. Pulmonary/Chest: Effort normal  and breath sounds normal. Tachypnea noted. No respiratory distress. He has no decreased breath sounds. He has no rhonchi. He exhibits no tenderness.  Pt desats to 84% on room air.  Tachypnea noted.  Patient appears mildly diaphoretic.  Patient placed on 2 L of oxygen with improvement in that saturations to 96%.  Tachypnea improved.  Patient has crackles in the bilateral lower lobes.  Abdominal: Soft. Bowel sounds are normal. He exhibits no  distension. There is no tenderness. There is no rebound and no guarding.  Musculoskeletal: Normal range of motion.       Right lower leg: He exhibits edema.       Left lower leg: He exhibits edema.  Trace pitting edema is to the level of the ankle.  There is no calf tenderness.  No erythema noted.  Lymphadenopathy:    He has no cervical adenopathy.  Neurological: He is alert and oriented to person, place, and time.  Skin: Skin is warm and dry. Capillary refill takes less than 2 seconds. He is not diaphoretic.  Psychiatric: His behavior is normal. Judgment and thought content normal.  Nursing note and vitals reviewed.    ED Treatments / Results  Labs (all labs ordered are listed, but only abnormal results are displayed) Labs Reviewed  BASIC METABOLIC PANEL - Abnormal; Notable for the following components:      Result Value   Chloride 100 (*)    Glucose, Bld 302 (*)    BUN 24 (*)    Calcium 8.7 (*)    All other components within normal limits  CBC WITH DIFFERENTIAL/PLATELET - Abnormal; Notable for the following components:   RBC 3.71 (*)    Hemoglobin 10.3 (*)    HCT 32.3 (*)    All other components within normal limits  TROPONIN I - Abnormal; Notable for the following components:   Troponin I 0.04 (*)    All other components within normal limits  BRAIN NATRIURETIC PEPTIDE - Abnormal; Notable for the following components:   B Natriuretic Peptide 623.2 (*)    All other components within normal limits    EKG EKG Interpretation  Date/Time:  Monday Apr 28 2018 10:27:25 EDT Ventricular Rate:  101 PR Interval:    QRS Duration: 140 QT Interval:  374 QTC Calculation: 485 R Axis:   27 Text Interpretation:  Sinus tachycardia Multiform ventricular premature complexes Probable LVH with secondary repol abnrm Borderline prolonged QT interval no significant ischemia noted. elevated isolvated to V2. Confirmed by Bary Castilla ([id number]) on 04/28/2018 11:19:48 AM   Radiology Dg  Chest 2 View  Result Date: 04/28/2018 CLINICAL DATA:  Chest pain and shortness of breath for several months EXAM: CHEST - 2 VIEW COMPARISON:  04/18/2018 FINDINGS: Cardiac shadow is stable. Postsurgical changes are again seen. Lungs are well aerated bilaterally. Small left pleural effusion is noted new from the prior study. Stable vascular congestion is noted without interstitial edema. No bony abnormality is seen. IMPRESSION: New small left pleural effusion. Stable vascular congestion without edema. Electronically Signed   By: Alcide Clever M.D.   On: 04/28/2018 11:39   Ct Angio Chest Pe W/cm &/or Wo Cm  Result Date: 04/28/2018 CLINICAL DATA:  Chest pain, shortness of breath. EXAM: CT ANGIOGRAPHY CHEST WITH CONTRAST TECHNIQUE: Multidetector CT imaging of the chest was performed using the standard protocol during bolus administration of intravenous contrast. Multiplanar CT image reconstructions and MIPs were obtained to evaluate the vascular anatomy. CONTRAST:  ISOVUE-370 IOPAMIDOL (ISOVUE-370) INJECTION 76% COMPARISON:  CT scan of December 16, 2017. FINDINGS: Cardiovascular: Satisfactory opacification of the pulmonary arteries to the segmental level. No evidence of pulmonary embolism. Normal heart size. No pericardial effusion. Mediastinum/Nodes: Stable calcified left thyroid nodule. The esophagus is unremarkable. Stable mediastinal adenopathy is noted which most likely is reactive in etiology. Lungs/Pleura: Moderate bilateral pleural effusions are noted. No pneumothorax is noted. Mosaic airspace opacity is noted which may represent focal air trapping or possible multifocal inflammation. Upper Abdomen: Probable hepatic cirrhosis. Status post gastric bypass. Musculoskeletal: No chest wall abnormality. No acute or significant osseous findings. Review of the MIP images confirms the above findings. IMPRESSION: No definite evidence of pulmonary embolus. Moderate bilateral pleural effusions. Stable mediastinal  adenopathy is noted which most likely is reactive in etiology. Mosaic airspace opacity is noted throughout both lungs which may represent focal air trapping or possibly multifocal inflammation. Probable hepatic cirrhosis. Electronically Signed   By: Lupita Raider, M.D.   On: 04/28/2018 13:46    Procedures Procedures (including critical care time)  Medications Ordered in ED Medications  iopamidol (ISOVUE-370) 76 % injection 100 mL (100 mLs Intravenous Contrast Given 04/28/18 1319)  furosemide (LASIX) injection 20 mg (20 mg Intravenous Given 04/28/18 1416)     Initial Impression / Assessment and Plan / ED Course  I have reviewed the triage vital signs and the nursing notes.  Pertinent labs & imaging results that were available during my care of the patient were reviewed by me and considered in my medical decision making (see chart for details).     Patient presents to the emergency department today for evaluation of worsening exertional dyspnea and lower extremity swelling.  Reports intermittent chest pain with exertion but currently denies any chest pain.  Patient has not had chest pain for the past 24 to 48 hours.  Patient denies any associated fevers or cough.  On exam patient is hypoxic on room air to 86%.  He was placed on 2 L.  Mild tachypnea noted.  No significant tachycardia or hypotension is noted.  Regular rate and rhythm without any rubs murmurs or gallops.  Lungs with bilateral lower crackles noted.  Abdominal exam benign.  Neurovascularly intact in all extremities.  Lab work reveals no leukocytosis.  Hemoglobin appears the patient's baseline.  No significant electrolyte derangement.  Normal kidney function.  Patient glucose elevated at 302 with history of diabetes.  Patient's troponin was elevated at 0.04.  BNP was elevated at 623.  EKG shows sinus tachycardia with borderline prolonged QT.  Does have isolated elevation in V2 but no other acute ischemic changes.  This appears  similar to patient's prior tracing.  Repeat EKG shows no acute changes 3 hours later.  Patient's troponin did elevate 2.06.  I suspect this is secondary to demand ischemia.  Patient not currently having chest pain.  This will need to be trended and further work-up with cardiology if needed.  Chest x-ray revealed new small left pleural effusion. Stable vascular congestion without edema.  Given patient's hypoxia, elevated troponin and shortness of breath subsequent CTA PE study was performed.  IMPRESSION: No definite evidence of pulmonary embolus. Moderate bilateral pleural effusions. Stable mediastinal adenopathy is noted which most likely is reactive in etiology. Mosaic airspace opacity is noted throughout both lungs which may represent focal air trapping or possibly multifocal inflammation. Probable hepatic cirrhosis.   Doubt infectious process given patient has no fever no cough and is without any leukocytosis.  Patient's CT seems consistent with mild CHF.  Patient  given Lasix in the ED.  Patient has had significant improvement in his tachypnea and hypoxia.  I suspect the patient's symptoms are consistent with a CHF exacerbation.  Given patient's hypoxia patient will need admission for further work-up.  Patient was given the option to be admitted to [facility] given that is where his primary care is.  I discussed with the attending over at [facility] who states they have no monitored beds.  They recommended that we can transfer patient to the ER or if patient wants to be admitted to our facility they are welcome to be.  Dicussed with patient and they would rather be admitted to our hospital system.  I spoke to Dr. Butler Denmark with hospital medicine at [facility].  He agrees to admission.  She is asked that I place patient on a sliding scale insulin given patient's hyperglycemia.  Patient remains hemodynamically stable and chest pain-free.  Appears significantly improved with  oxygen and Lasix.  Patient was also seen and evaluated with my attending who is agreed with this plan.  Patient transferred by EMS to [facility] for admission.  Final Clinical Impressions(s) / ED Diagnoses   Final diagnoses:  Hypoxia  SOB (shortness of breath)    ED Discharge Orders    None       Wallace Keller 04/28/18 1929    Abelino Derrick, MD 04/29/18 [phone]

## 2018-04-28 NOTE — ED Notes (Signed)
ED Provider at bedside. 

## 2018-04-28 NOTE — Plan of Care (Signed)

## 2018-04-28 NOTE — ED Notes (Signed)
SpO2 85-90% on room air while ambulating, HR 105-112, +DOE.  Placed back on 2l/m SpO2 increased to 97%

## 2018-04-28 NOTE — ED Triage Notes (Signed)
Patient states that he has had chest pain and SOB with activity since January when he had Pneumonia - the patient states that the SOB is getting worse over the last couple of weeks, The patient reports that he is not having any Cehst pain at this time but is having SOB

## 2018-04-28 NOTE — ED Notes (Signed)
Ambulated to restroom. R/A SpO2 96% HR 95, denies DOE.

## 2018-04-28 NOTE — ED Notes (Signed)
[facility] aware of the patients ready bed

## 2018-04-28 NOTE — ED Notes (Signed)
Patient transported to CT 

## 2018-04-28 NOTE — H&P (Addendum)
History and Physical    Alec Taylor DXA:[id number] DOB: [birth year] DOA: 04/28/2018  Referring MD/NP/PA: Calvert Cantor, MD PCP: Patient, No Pcp Per  Patient coming from: [facility] transfer  Chief Complaint: Shortness of breath  I have personally briefly reviewed patient's old medical records in [facility]   HPI: Alec Taylor is a 72 y.o. male with medical history significant of HTN, HLD, CAD s/p CABG, DM type II, and gastric bypass; who presents with complaints of shortness of breath with exertion.  Symptoms have been progressively worsening over the last 1 month.  Patient reports being last admitted to the hospital back in January 2019 with pneumonia.  Normally patient is able to play golf without any issues reported having to stop couple rounds early due to being out of breath.  Associated symptoms include right-sided chest tightness, leg swelling, leg cramps, and orthopnea that does not sound new.  Denies any nausea, vomiting, diaphoresis, loss of consciousness, palpitations, recent travel, or paroxysmal nocturnal dyspnea.  He previously had a CABG, but reports that was over 15 years ago, and last EF was noted to be around 45%.    ED Course: Upon admission into the emergency department patient was noted to be afebrile, pulse 93-104, respirations 15-31, blood pressure stable, and O2 saturation 86-100% initially on 2 L of nasal cannula oxygen.  Patient was able to be weaned to room air.  Initial labs revealed CBC 6.5, hemoglobin 10.3, BUN 24, creatinine 0.97, glucose 302, BNP 623.2, and troponin 0.04.  Chest x-ray showed a new small left pleural effusion.  Due to symptoms CT angiogram of the chest was obtained showing no signs of PE, but moderate bilateral pleural effusions.  Patient was given 20 mg of Lasix IV.  TRH called to admit.  Review of Systems  Constitutional: Negative for chills and fever.  HENT: Negative for congestion and ear pain.   Eyes: Negative for blurred vision and double  vision.  Respiratory: Positive for shortness of breath.   Cardiovascular: Positive for chest pain and leg swelling.  Gastrointestinal: Negative for abdominal pain, nausea and vomiting.  Genitourinary: Negative for dysuria and frequency.  Musculoskeletal: Positive for falls (Reports accidentally tripping over his dog) and myalgias.  Skin: Negative for itching and rash.  Neurological: Negative for loss of consciousness and weakness.  Endo/Heme/Allergies: Negative for polydipsia.  Psychiatric/Behavioral: Negative for substance abuse. The patient is nervous/anxious.     Past Medical History:  Diagnosis Date  . COPD (chronic obstructive pulmonary disease) (HCC)   . Depression   . Diabetes mellitus without complication (HCC)   . Hypertension     Past Surgical History:  Procedure Laterality Date  . APPENDECTOMY    . CHOLECYSTECTOMY    . CORONARY ARTERY BYPASS GRAFT    . GASTRIC BYPASS       reports that he has been smoking.  He has never used smokeless tobacco. He reports that he drinks alcohol. He reports that he does not use drugs.  Allergies  Allergen Reactions  . Altace [Ramipril]     Cough     History reviewed. No pertinent family history.  Prior to Admission medications   Medication Sig Start Date End Date Taking? Authorizing Provider  albuterol (PROVENTIL HFA;VENTOLIN HFA) 108 (90 Base) MCG/ACT inhaler Inhale into the lungs every 6 (six) hours as needed for wheezing or shortness of breath.   Yes [provider]  ammonium lactate (LAC-HYDRIN) 12 % lotion Apply topically 2 (two) times daily as needed. To feet 04/21/18  Yes [provider]  aspirin EC 81 MG tablet Take 81 mg by mouth at bedtime.   Yes [provider]  atenolol (TENORMIN) 25 MG tablet Take 25 mg by mouth every morning. 08/01/10  Yes [provider]  atorvastatin (LIPITOR) 20 MG tablet Take 20 mg by mouth daily. 04/08/18  Yes [provider]  bimatoprost (LATISSE)  0.03 % ophthalmic solution Place one drop on applicator and apply evenly along the skin of the upper eyelid at base of eyelashes once daily at bedtime; repeat procedure for second eye (use a clean applicator). Can also apply to the eyebrows 07/16/17  Yes [provider]  buPROPion (WELLBUTRIN XL) 150 MG 24 hr tablet Take 150 mg by mouth every morning. 03/06/18  Yes [provider]  busPIRone (BUSPAR) 15 MG tablet Take 15 mg by mouth every morning. 01/17/18  Yes [provider]  Cholecalciferol (VITAMIN D3) 5000 units CAPS Take 5,000 Units by mouth 2 (two) times daily. 12/19/17  Yes [provider]  clonazePAM (KLONOPIN) 0.5 MG tablet Take 0.5 mg by mouth at bedtime as needed. 03/06/18  Yes [provider]  donepezil (ARICEPT) 5 MG tablet Take 5 mg by mouth daily. 03/31/18  Yes [provider]  DULoxetine (CYMBALTA) 60 MG capsule Take 60 mg by mouth at bedtime. 03/06/18  Yes [provider]  finasteride (PROSCAR) 5 MG tablet Take 5 mg by mouth every evening.  04/20/18  Yes [provider]  fluocinonide (LIDEX) 0.05 % external solution Apply topically. 07/01/15  Yes [provider]  folic acid (FOLVITE) 1 MG tablet Take 1 mg by mouth daily. 04/20/18  Yes [provider]  glimepiride (AMARYL) 2 MG tablet Take 2 mg by mouth every evening. 04/20/18  Yes [provider]  hydroxychloroquine (PLAQUENIL) 200 MG tablet Take 200 mg by mouth 2 (two) times daily. 06/20/15  Yes [provider]  metFORMIN (GLUCOPHAGE-XR) 500 MG 24 hr tablet Take 500 mg by mouth 2 (two) times daily. 04/20/18  Yes [provider]  Multiple Vitamins-Minerals (MULTIVITAMIN ADULT PO) Take 1 tablet by mouth every morning.   Yes [provider]  naproxen sodium (ALEVE) 220 MG tablet Take 220 mg by mouth 2 (two) times daily as needed.   Yes [provider]  pantoprazole (PROTONIX) 40 MG tablet Take 40 mg by mouth 2 (two)  times daily. 04/20/18  Yes [provider]  saccharomyces boulardii ([employer] DIGESTIVE PROBIOTIC) 250 MG capsule Take 250 mg by mouth every morning. 06/20/15  Yes [provider]  zolpidem (AMBIEN CR) 12.5 MG CR tablet Take 12.5 mg by mouth at bedtime as needed. 03/06/18  Yes [provider]  methotrexate (RHEUMATREX) 2.5 MG tablet Take 5 mg by mouth once a week. On Wednesday 04/24/18   [provider]  ofloxacin (OCUFLOX) 0.3 % ophthalmic solution Place 1 drop into both eyes 4 (four) times daily. Prior to procedures.    [provider]    Physical Exam:  Constitutional: Elderly male no acute distress at this time Vitals:   04/28/18 1420 04/28/18 1605 04/28/18 1646 04/28/18 1840  BP: 140/75  129/66 136/75  Pulse: 98  93 93  Resp: (!) 24  20 18   Temp:    98.2 F (36.8 C)  TempSrc:    Oral  SpO2: 95% 94% 96% 97%  Weight:    85.5 kg (188 lb 8 oz)  Height:    5\' 10"  (1.778 m)   Eyes: PERRL, lids and  conjunctivae normal ENMT: Mucous membranes are moist. Posterior pharynx clear of any exudate or lesions. .  Neck: normal, supple, no masses, no thyromegaly Respiratory: Normal respiratory effort with bibasilar crackles appreciated.  No significant wheezes or rhonchi noted.  Patient able to talk in complete sentences Cardiovascular: Regular rate and rhythm, no murmurs / rubs / gallops. No extremity edema. 2+ pedal pulses. No carotid bruits.  Abdomen: no tenderness, no masses palpated. No hepatosplenomegaly. Bowel sounds positive.  Musculoskeletal: no clubbing / cyanosis. No joint deformity upper and lower extremities. Good ROM, no contractures. Normal muscle tone.  Skin: no rashes, lesions, ulcers. No induration Neurologic: CN 2-12 grossly intact. Sensation intact, DTR normal. Strength 5/5 in all 4.  Psychiatric: Normal judgment and insight. Alert and oriented x 3. Normal mood.     Labs on Admission: I have personally reviewed following labs and imaging  studies  CBC: Recent Labs  Lab 04/28/18 1039  WBC 6.5  NEUTROABS 4.9  HGB 10.3*  HCT 32.3*  MCV 87.1  PLT 275   Basic Metabolic Panel: Recent Labs  Lab 04/28/18 1039  NA 135  K 4.7  CL 100*  CO2 25  GLUCOSE 302*  BUN 24*  CREATININE 0.97  CALCIUM 8.7*   GFR: Estimated Creatinine Clearance: 73.2 mL/min (by C-G formula based on SCr of 0.97 mg/dL). Liver Function Tests: No results for input(s): AST, ALT, ALKPHOS, BILITOT, PROT, ALBUMIN in the last 168 hours. No results for input(s): LIPASE, AMYLASE in the last 168 hours. No results for input(s): AMMONIA in the last 168 hours. Coagulation Profile: No results for input(s): INR, PROTIME in the last 168 hours. Cardiac Enzymes: Recent Labs  Lab 04/28/18 1039 04/28/18 1606  TROPONINI 0.04* 0.06*   BNP (last 3 results) No results for input(s): PROBNP in the last 8760 hours. HbA1C: No results for input(s): HGBA1C in the last 72 hours. CBG: No results for input(s): GLUCAP in the last 168 hours. Lipid Profile: No results for input(s): CHOL, HDL, LDLCALC, TRIG, CHOLHDL, LDLDIRECT in the last 72 hours. Thyroid Function Tests: No results for input(s): TSH, T4TOTAL, FREET4, T3FREE, THYROIDAB in the last 72 hours. Anemia Panel: No results for input(s): VITAMINB12, FOLATE, FERRITIN, TIBC, IRON, RETICCTPCT in the last 72 hours. Urine analysis: No results found for: COLORURINE, APPEARANCEUR, LABSPEC, PHURINE, GLUCOSEU, HGBUR, BILIRUBINUR, KETONESUR, PROTEINUR, UROBILINOGEN, NITRITE, LEUKOCYTESUR Sepsis Labs: No results found for this or any previous visit (from the past 240 hour(s)).   Radiological Exams on Admission: Dg Chest 2 View  Result Date: 04/28/2018 CLINICAL DATA:  Chest pain and shortness of breath for several months EXAM: CHEST - 2 VIEW COMPARISON:  04/18/2018 FINDINGS: Cardiac shadow is stable. Postsurgical changes are again seen. Lungs are well aerated bilaterally. Small left pleural effusion is noted new from the  prior study. Stable vascular congestion is noted without interstitial edema. No bony abnormality is seen. IMPRESSION: New small left pleural effusion. Stable vascular congestion without edema. Electronically Signed   By: Alcide Clever M.D.   On: 04/28/2018 11:39   Ct Angio Chest Pe W/cm &/or Wo Cm  Result Date: 04/28/2018 CLINICAL DATA:  Chest pain, shortness of breath. EXAM: CT ANGIOGRAPHY CHEST WITH CONTRAST TECHNIQUE: Multidetector CT imaging of the chest was performed using the standard protocol during bolus administration of intravenous contrast. Multiplanar CT image reconstructions and MIPs were obtained to evaluate the vascular anatomy. CONTRAST:  ISOVUE-370 IOPAMIDOL (ISOVUE-370) INJECTION 76% COMPARISON:  CT scan of December 16, 2017. FINDINGS: Cardiovascular: Satisfactory opacification of the pulmonary  arteries to the segmental level. No evidence of pulmonary embolism. Normal heart size. No pericardial effusion. Mediastinum/Nodes: Stable calcified left thyroid nodule. The esophagus is unremarkable. Stable mediastinal adenopathy is noted which most likely is reactive in etiology. Lungs/Pleura: Moderate bilateral pleural effusions are noted. No pneumothorax is noted. Mosaic airspace opacity is noted which may represent focal air trapping or possible multifocal inflammation. Upper Abdomen: Probable hepatic cirrhosis. Status post gastric bypass. Musculoskeletal: No chest wall abnormality. No acute or significant osseous findings. Review of the MIP images confirms the above findings. IMPRESSION: No definite evidence of pulmonary embolus. Moderate bilateral pleural effusions. Stable mediastinal adenopathy is noted which most likely is reactive in etiology. Mosaic airspace opacity is noted throughout both lungs which may represent focal air trapping or possibly multifocal inflammation. Probable hepatic cirrhosis. Electronically Signed   By: Lupita Raider, M.D.   On: 04/28/2018 13:46    EKG:  Independently reviewed.  Sinus rhythm with signs of LVH and QTC prolonged at 502  Assessment/Plan Pleural effusion, suspected congestive heart failure exacerbation: Acute.  Patient presents with a respiratory worsening dyspnea on exertion.  Imaging studies revealed moderate pleural effusions with no signs of a pulmonary embolus.  BNP was elevated at 623.2.  Patient denies any previous history of congestive heart failure.  Patient was given 20 mg of Lasix IV.  TRH called to admit.  On differential includes possibility of arrhythmia as possible cause of symptoms. - Admit to a telemetry bed - Heart failure orders set  initiated  - Continuous pulse oximetry with nasal cannula oxygen as needed to keep O2 saturations >92% - Strict I&Os and daily weights - Check TSH - Elevate lower extremities - Lasix 40 mg IV Bid - Reassess in a.m. and adjust diuresis as needed. - Check echocardiogram - Optimize medical management when medically appropriate - Message sent for cardiology to eval in a.m.  Elevated cardiac troponin: Acute.  Initial troponin elevated up to 0.06 on admission.  Suspect due to acute stress. - Trend cardiac troponin - Follow-up echocardiogram  Hypoxia: Now resolved.  Patient initially noted to have O2 saturations as low as 86% on room air, but improved after initial diuresis. - Continue to monitor  Prolonged QT interval: Acute.  QTC elevated at 502 on admission. - Correct electrolyte disturbances - Limiting QT prolonging medications  Diabetes mellitus type 2: On admission glucose elevated to 302.  Patient reports having  Hemoglobin A1c under 6.- Hypoglycemic protocol - Check hemoglobin A1c - Hold metformin and Amaryl - Levemir 5 units nightly - CBGs q. before meals and at bedtime with moderate SSI - Adjust insulin regimen as needed  Coronary artery disease: Patient with previous history of CABG. - Continue aspirin  Anxiety and depression - Continue Cymbalta, BuSpar, and  Wellbutrin  Alopecia - Continue methotrexate  - Hold Plaquenil due to prolonged QTc  Mild cognitive impairment - Held Aricept due to prolonged QTC  Normocytic normochromic anemia: Hemoglobin 10.3 on admission patient denies any reports of bleeding. - Recheck CBC in a.m.  GERD - Continue Protonix  DVT prophylaxis: Lovenox  Code Status: Full Family Communication: Discussed plan of care with the patient family present at bedside Disposition Plan: Likely discharge home once medically stable Consults called: none  Admission status: inpatient  Clydie Braun MD [facility] Pager [phone]   If 7PM-7AM, please contact night-coverage [email or web address] Password [facility]  04/28/2018, 7:24 PM

## 2018-04-28 NOTE — Progress Notes (Signed)
Called for direct admit by Regis Bill, PA for 72 y/o M who presents with dyspnea on exertion. H/o CAD/CABG, DM2, HTN., EF of 45-50%. .  Pulse ox 88% on room air at rest and 83% on exertion. 96% on 2 L BNP 623 and Troponin 0.04. No chest pain.  CT shows b/l pleural effusion and infiltrates- no cough or pneumonia like symptoms.  Repeat Troponin and EKG ordered. Given Lasix 20 mg IV once. Sugar is reading 306 on the lab work.. I have asked for a sliding scale to be ordered. Admit to Telemetry.

## 2018-04-28 NOTE — Progress Notes (Addendum)
Patient transferred from [facility] at 59. [facility] floor manager notified and stated she will notify the 7pm MD when he gets here. Patient informed. Oriented patient to room and unit, telemetry applied and call. Vitals done, reported off to night shift nurse to F/U with plan of care.  Scab abrasion on mid buttocks and abrasion on right leg patient stated its from fall at home. Bed alarm initiated.

## 2018-04-28 NOTE — Progress Notes (Signed)
Paged doctor about critical Troponin level.  

## 2018-04-28 NOTE — ED Notes (Signed)
Called PAL for [facility] hospitalist

## 2018-04-28 NOTE — Progress Notes (Signed)
Admission nurse notified of patient's arrival.

## 2018-04-29 ENCOUNTER — Inpatient Hospital Stay (HOSPITAL_COMMUNITY): Payer: BLUE CROSS/BLUE SHIELD

## 2018-04-29 ENCOUNTER — Encounter (HOSPITAL_COMMUNITY): Payer: Self-pay | Admitting: Cardiology

## 2018-04-29 DIAGNOSIS — R9431 Abnormal electrocardiogram [ECG] [EKG]: Secondary | ICD-10-CM | POA: Diagnosis present

## 2018-04-29 DIAGNOSIS — I351 Nonrheumatic aortic (valve) insufficiency: Secondary | ICD-10-CM

## 2018-04-29 DIAGNOSIS — I248 Other forms of acute ischemic heart disease: Secondary | ICD-10-CM | POA: Diagnosis present

## 2018-04-29 DIAGNOSIS — I5023 Acute on chronic systolic (congestive) heart failure: Secondary | ICD-10-CM

## 2018-04-29 DIAGNOSIS — F419 Anxiety disorder, unspecified: Secondary | ICD-10-CM

## 2018-04-29 DIAGNOSIS — J90 Pleural effusion, not elsewhere classified: Secondary | ICD-10-CM | POA: Diagnosis present

## 2018-04-29 DIAGNOSIS — R748 Abnormal levels of other serum enzymes: Secondary | ICD-10-CM

## 2018-04-29 DIAGNOSIS — E119 Type 2 diabetes mellitus without complications: Secondary | ICD-10-CM

## 2018-04-29 DIAGNOSIS — I2510 Atherosclerotic heart disease of native coronary artery without angina pectoris: Secondary | ICD-10-CM | POA: Diagnosis present

## 2018-04-29 DIAGNOSIS — F329 Major depressive disorder, single episode, unspecified: Secondary | ICD-10-CM | POA: Diagnosis present

## 2018-04-29 LAB — CBC WITH DIFFERENTIAL/PLATELET
BASOS ABS: 0 10*3/uL (ref 0.0–0.1)
Basophils Relative: 0 %
EOS PCT: 3 %
Eosinophils Absolute: 0.2 10*3/uL (ref 0.0–0.7)
HEMATOCRIT: 34.3 % — AB (ref 39.0–52.0)
HEMOGLOBIN: 10.5 g/dL — AB (ref 13.0–17.0)
LYMPHS ABS: 1.3 10*3/uL (ref 0.7–4.0)
LYMPHS PCT: 17 %
MCH: 26.2 pg (ref 26.0–34.0)
MCHC: 30.6 g/dL (ref 30.0–36.0)
MCV: 85.5 fL (ref 78.0–100.0)
Monocytes Absolute: 0.7 10*3/uL (ref 0.1–1.0)
Monocytes Relative: 9 %
NEUTROS ABS: 5.5 10*3/uL (ref 1.7–7.7)
Neutrophils Relative %: 71 %
Platelets: 314 10*3/uL (ref 150–400)
RBC: 4.01 MIL/uL — AB (ref 4.22–5.81)
RDW: 14.7 % (ref 11.5–15.5)
WBC: 7.7 10*3/uL (ref 4.0–10.5)

## 2018-04-29 LAB — GLUCOSE, CAPILLARY
GLUCOSE-CAPILLARY: 177 mg/dL — AB (ref 65–99)
Glucose-Capillary: 184 mg/dL — ABNORMAL HIGH (ref 65–99)
Glucose-Capillary: 210 mg/dL — ABNORMAL HIGH (ref 65–99)
Glucose-Capillary: 176 mg/dL — ABNORMAL HIGH (ref 65–99)

## 2018-04-29 LAB — COMPREHENSIVE METABOLIC PANEL
ALK PHOS: 90 U/L (ref 38–126)
ALT: 36 U/L (ref 17–63)
AST: 34 U/L (ref 15–41)
Albumin: 3.6 g/dL (ref 3.5–5.0)
Anion gap: 12 (ref 5–15)
BILIRUBIN TOTAL: 0.9 mg/dL (ref 0.3–1.2)
BUN: 22 mg/dL — AB (ref 6–20)
CALCIUM: 9.3 mg/dL (ref 8.9–10.3)
CHLORIDE: 98 mmol/L — AB (ref 101–111)
CO2: 29 mmol/L (ref 22–32)
CREATININE: 0.91 mg/dL (ref 0.61–1.24)
Glucose, Bld: 203 mg/dL — ABNORMAL HIGH (ref 65–99)
Potassium: 4 mmol/L (ref 3.5–5.1)
Sodium: 139 mmol/L (ref 135–145)
Total Protein: 7.1 g/dL (ref 6.5–8.1)

## 2018-04-29 LAB — ECHOCARDIOGRAM COMPLETE
Height: 70 in
WEIGHTICAEL: 2963.2 [oz_av]

## 2018-04-29 LAB — HEMOGLOBIN A1C
Hgb A1c MFr Bld: 6.8 % — ABNORMAL HIGH (ref 4.8–5.6)
MEAN PLASMA GLUCOSE: 148.46 mg/dL

## 2018-04-29 LAB — TROPONIN I
TROPONIN I: 0.05 ng/mL — AB (ref <0.03)
Troponin I: 0.04 ng/mL (ref <0.03)

## 2018-04-29 MED ORDER — SODIUM CHLORIDE 0.9% FLUSH
3.0000 mL | Freq: Two times a day (BID) | INTRAVENOUS | Status: DC
  Administered 2018-04-30: 3 mL via INTRAVENOUS

## 2018-04-29 MED ORDER — CYCLOBENZAPRINE HCL 5 MG PO TABS
5.0000 mg | ORAL_TABLET | Freq: Once | ORAL | Status: AC
  Administered 2018-04-30: 5 mg via ORAL
  Filled 2018-04-29: qty 1

## 2018-04-29 MED ORDER — SODIUM CHLORIDE 0.9% FLUSH: 3.0000 mL | INTRAVENOUS | Status: DC | PRN

## 2018-04-29 MED ORDER — CLOPIDOGREL BISULFATE 75 MG PO TABS
300.0000 mg | ORAL_TABLET | Freq: Once | ORAL | Status: AC
  Administered 2018-04-29: 300 mg via ORAL
  Filled 2018-04-29: qty 4

## 2018-04-29 MED ORDER — SODIUM CHLORIDE 0.9 % IV SOLN
INTRAVENOUS | Status: DC
  Administered 2018-04-30: 06:00:00 via INTRAVENOUS

## 2018-04-29 MED ORDER — ASPIRIN 81 MG PO CHEW
81.0000 mg | CHEWABLE_TABLET | ORAL | Status: AC
  Administered 2018-04-30: 81 mg via ORAL
  Filled 2018-04-29: qty 1

## 2018-04-29 MED ORDER — SODIUM CHLORIDE 0.9 % IV SOLN: 250.0000 mL | INTRAVENOUS | Status: DC | PRN

## 2018-04-29 MED ORDER — CLOPIDOGREL BISULFATE 75 MG PO TABS
75.0000 mg | ORAL_TABLET | Freq: Every day | ORAL | Status: DC
  Administered 2018-04-30 – 2018-05-01 (×2): 75 mg via ORAL
  Filled 2018-04-29 (×2): qty 1

## 2018-04-29 NOTE — Progress Notes (Signed)
Paged doctor as patient is requesting something to help with his leg cramps. Awaiting response/new orders.

## 2018-04-29 NOTE — Plan of Care (Signed)

## 2018-04-29 NOTE — Consult Note (Addendum)
Cardiology Consultation:   Patient ID: Alec Taylor; [id number]; 1946/06/17   Admit date: 04/28/2018 Date of Consult: 04/29/2018  Primary Care Provider: Patient, No Pcp Per Primary Cardiologist: Dr. Tamala Fothergill  Patient Profile:   Alec Taylor is a 72 y.o. male with a hx of CAD s/p CABG (approximately 2004-[facility]), HTN, HLD, DM 2 and gastric bypass who is being seen today for the evaluation of new onset shortness of breath and exertional chest tightness at the request of Dr. Katrinka Blazing.  History of Present Illness:   Alec Taylor is a 72 year old male with a history stated above who presented to [facility] on 04/28/2018 with complaints of worsening shortness of breath over the last month.  He reports being recently admitted to the hospital in 12/2017 with pneumonia.  At baseline, patient is able to play golf without issues however, has recently noticed that he gets short of breath even with walking to the car. He also has complaints of right sided, nonradiating chest tightness rated a 4/10 in severity.  He states the chest tightness is usually associated with exertion and shortness of breath and is usually relieved with rest.  He denies associated nausea, vomiting, diaphoresis, palpitations or syncope.  He also reports mild LE swelling. He states that he does not smoke cigarettes and uses occasional alcohol.  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  Patient reports no anginal symptoms since his surgery approximately 15 years ago.    In the ED, BNP was noted to be mildly elevated at 623 and an initial troponin of 0.04.  Chest x-ray revealed new small left pleural effusion.  Given his shortness of breath and tachycardia on presentation, chest CT angiogram was performed which showed no signs of PE however, moderate bilateral pleural effusions. Patient was given IV Lasix 20 mg with good results.  Of note, he saw his PCP on 04/18/2018 for similar SOB complaints. Patient was started  on albuterol, PFTs were ordered and patient was to get chest PA and lateral for pneumonia follow-up scheduled for this coming Thursday.  Last echocardiogram performed 12/2017 during his last hospital admission revealed LVEF of 45 to 50%.  Baseline weight appears to be 188-190lbs for the last 33-month time.  He has no prior history of CHF  He is followed by Dr. Beverely Pace with [facility] in [facility] and was last seen 03/27/2017 per chart review.  At that time he reported improving his exercise tolerance by increasing his activity level and going more routinely to the fitness center for exercise sessions.  He had no reports of anginal symptoms and was otherwise doing well. He reports several stress tests over the years per Dr. Beverely Pace however, I do not see these results in care everywhere.  These tests were routinely performed  Cardiology was consulted given his prior CABG history, elevated troponin and symptoms worrisome for acute CHF exacerbation.  Past Medical History:  Diagnosis Date  . CAD (coronary artery disease)    s/p CABG approximately 2004-[facility]  . COPD (chronic obstructive pulmonary disease) (HCC)   . Depression   . Diabetes mellitus without complication (HCC)   . Hypertension     Past Surgical History:  Procedure Laterality Date  . APPENDECTOMY    . CHOLECYSTECTOMY    . CORONARY ARTERY BYPASS GRAFT    . GASTRIC BYPASS       Prior to Admission medications   Medication Sig Start Date End Date Taking? Authorizing Provider  albuterol (PROVENTIL HFA;VENTOLIN  HFA) 108 (90 Base) MCG/ACT inhaler Inhale into the lungs every 6 (six) hours as needed for wheezing or shortness of breath.   Yes [provider]  ammonium lactate (LAC-HYDRIN) 12 % lotion Apply topically 2 (two) times daily as needed. To feet 04/21/18  Yes [provider]  aspirin EC 81 MG tablet Take 81 mg by mouth at bedtime.   Yes [provider]  atenolol (TENORMIN) 25 MG tablet Take 25 mg by mouth  every morning. 08/01/10  Yes [provider]  atorvastatin (LIPITOR) 20 MG tablet Take 20 mg by mouth daily. 04/08/18  Yes [provider]  bimatoprost (LATISSE) 0.03 % ophthalmic solution Place one drop on applicator and apply evenly along the skin of the upper eyelid at base of eyelashes once daily at bedtime; repeat procedure for second eye (use a clean applicator). Can also apply to the eyebrows 07/16/17  Yes [provider]  buPROPion (WELLBUTRIN XL) 150 MG 24 hr tablet Take 150 mg by mouth every morning. 03/06/18  Yes [provider]  busPIRone (BUSPAR) 15 MG tablet Take 15 mg by mouth every morning. 01/17/18  Yes [provider]  Cholecalciferol (VITAMIN D3) 5000 units CAPS Take 5,000 Units by mouth 2 (two) times daily. 12/19/17  Yes [provider]  clonazePAM (KLONOPIN) 0.5 MG tablet Take 0.5 mg by mouth at bedtime as needed. 03/06/18  Yes [provider]  donepezil (ARICEPT) 5 MG tablet Take 5 mg by mouth daily. 03/31/18  Yes [provider]  DULoxetine (CYMBALTA) 60 MG capsule Take 60 mg by mouth at bedtime. 03/06/18  Yes [provider]  finasteride (PROSCAR) 5 MG tablet Take 5 mg by mouth every evening.  04/20/18  Yes [provider]  fluocinonide (LIDEX) 0.05 % external solution Apply topically. 07/01/15  Yes [provider]  folic acid (FOLVITE) 1 MG tablet Take 1 mg by mouth daily. 04/20/18  Yes [provider]  glimepiride (AMARYL) 2 MG tablet Take 2 mg by mouth every evening. 04/20/18  Yes [provider]  hydroxychloroquine (PLAQUENIL) 200 MG tablet Take 200 mg by mouth 2 (two) times daily. 06/20/15  Yes [provider]  metFORMIN (GLUCOPHAGE-XR) 500 MG 24 hr tablet Take 500 mg by mouth 2 (two) times daily. 04/20/18  Yes [provider]  Multiple Vitamins-Minerals (MULTIVITAMIN ADULT PO) Take 1 tablet by mouth every morning.   Yes [provider]  naproxen  sodium (ALEVE) 220 MG tablet Take 220 mg by mouth 2 (two) times daily as needed.   Yes [provider]  pantoprazole (PROTONIX) 40 MG tablet Take 40 mg by mouth 2 (two) times daily. 04/20/18  Yes [provider]  saccharomyces boulardii ([employer] DIGESTIVE PROBIOTIC) 250 MG capsule Take 250 mg by mouth every morning. 06/20/15  Yes [provider]  zolpidem (AMBIEN CR) 12.5 MG CR tablet Take 12.5 mg by mouth at bedtime as needed. 03/06/18  Yes [provider]  methotrexate (RHEUMATREX) 2.5 MG tablet Take 5 mg by mouth once a week. On Wednesday 04/24/18   [provider]  ofloxacin (OCUFLOX) 0.3 % ophthalmic solution Place 1 drop into both eyes 4 (four) times daily. Prior to procedures.    [provider]    Inpatient Medications: Scheduled Meds: . aspirin EC  81 mg Oral QHS  . atorvastatin  20 mg Oral Daily  . buPROPion  150 mg Oral Daily  . busPIRone  15 mg Oral Daily  . DULoxetine  60 mg  Oral QHS  . enoxaparin (LOVENOX) injection  40 mg Subcutaneous Q24H  . finasteride  5 mg Oral QPM  . folic acid  1 mg Oral Daily  . furosemide  40 mg Intravenous BID  . insulin aspart  0-15 Units Subcutaneous TID WC  . insulin aspart  0-5 Units Subcutaneous QHS  . insulin detemir  5 Units Subcutaneous QHS  . ofloxacin  1 drop Both Eyes QID  . pantoprazole  40 mg Oral BID  . saccharomyces boulardii  250 mg Oral Daily  . sodium chloride flush  3 mL Intravenous Q12H   Continuous Infusions: . sodium chloride     PRN Meds: sodium chloride, acetaminophen, ammonium lactate, clonazePAM, sodium chloride flush, zolpidem  Allergies:    Allergies  Allergen Reactions  . Altace [Ramipril]     Cough     Social History:   Social History   Socioeconomic History  . Marital status: Legally Separated    Spouse name: Not on file  . Number of children: Not on file  . Years of education: Not on file  . Highest education level: Not on file  Occupational History    . Not on file  Social Needs  . Financial resource strain: Not on file  . Food insecurity:    Worry: Not on file    Inability: Not on file  . Transportation needs:    Medical: Not on file    Non-medical: Not on file  Tobacco Use  . Smoking status: Current Every Day Smoker  . Smokeless tobacco: Never Used  Substance and Sexual Activity  . Alcohol use: Yes    Comment: occ  . Drug use: Never  . Sexual activity: Not on file  Lifestyle  . Physical activity:    Days per week: Not on file    Minutes per session: Not on file  . Stress: Not on file  Relationships  . Social connections:    Talks on phone: Not on file    Gets together: Not on file    Attends religious service: Not on file    Active member of club or organization: Not on file    Attends meetings of clubs or organizations: Not on file    Relationship status: Not on file  . Intimate partner violence:    Fear of current or ex partner: Not on file    Emotionally abused: Not on file    Physically abused: Not on file    Forced sexual activity: Not on file  Other Topics Concern  . Not on file  Social History Narrative  . Not on file    Family History:   Family History  Problem Relation Age of Onset  . Hypertension Father    Family Status:  Family Status  Relation Name Status  . Father  (Not Specified)    ROS:  Please see the history of present illness.  All other ROS reviewed and negative.     Physical Exam/Data:   Vitals:   04/28/18 1646 04/28/18 1840 04/28/18 2050 04/29/18 0408  BP: 129/66 136/75 (!) 119/55 119/75  Pulse: 93 93 95 95  Resp: 20 18 20 20   Temp:  98.2 F (36.8 C) 97.7 F (36.5 C) 98.3 F (36.8 C)  TempSrc:  Oral    SpO2: 96% 97% 94% 92%  Weight:  188 lb 8 oz (85.5 kg)  185 lb 3.2 oz (84 kg)  Height:  5\' 10"  (1.778 m)      Intake/Output Summary (  Last 24 hours) at 04/29/2018 0848 Last data filed at 04/29/2018 0411 Gross per 24 hour  Intake 240 ml  Output 2100 ml  Net -1860 ml    Filed Weights   04/28/18 1024 04/28/18 1840 04/29/18 0408  Weight: 190 lb (86.2 kg) 188 lb 8 oz (85.5 kg) 185 lb 3.2 oz (84 kg)   Body mass index is 26.57 kg/m.   General: Well developed, well nourished, NAD Skin: Warm, dry, intact  Head: Normocephalic, clear, moist mucus membranes. Neck: Negative for carotid bruits. No JVD Lungs:Clear to ausculation bilaterally. No wheezes, rales, or rhonchi. Breathing is unlabored. Cardiovascular: RRR with S1 S2. No murmurs, rubs or gallops Abdomen: Soft, non-tender, non-distended with normoactive bowel sounds.No obvious abdominal masses. MSK: Strength and tone appear normal for age. 5/5 in all extremities Extremities: No edema. No clubbing or cyanosis. DP/PT pulses 2+ bilaterally Neuro: Alert and oriented. No focal deficits. No facial asymmetry. MAE spontaneously. Psych: Responds to questions appropriately with normal affect.    EKG:  The EKG was personally reviewed and demonstrates: 04/28/2018 ST with PVCs and no acute ischemic ST-T wave abnormalities.  There is evidence of LVH Telemetry:  Telemetry was personally reviewed and demonstrates: 04/29/2018 NSR with occasional PVCs HR 97  Relevant CV Studies:  ECHO:  Echocardiogram 12/2017: Reported as normal LV function EF 45 to 50%  Echocardiogram 04/29/2017: Pending results  CATH: Last cardiac cath reported as 12/2014 however I cannot find these reports  Laboratory Data:  Chemistry Recent Labs  Lab 04/28/18 1039 04/29/18 0119  NA 135 139  K 4.7 4.0  CL 100* 98*  CO2 25 29  GLUCOSE 302* 203*  BUN 24* 22*  CREATININE 0.97 0.91  CALCIUM 8.7* 9.3  GFRNONAA >60 >60  GFRAA >60 >60  ANIONGAP 10 12    Total Protein  Date Value Ref Range Status  04/29/2018 7.1 6.5 - 8.1 g/dL Final   Albumin  Date Value Ref Range Status  04/29/2018 3.6 3.5 - 5.0 g/dL Final   AST  Date Value Ref Range Status  04/29/2018 34 15 - 41 U/L Final   ALT  Date Value Ref Range Status  04/29/2018 36 17 - 63  U/L Final   Alkaline Phosphatase  Date Value Ref Range Status  04/29/2018 90 38 - 126 U/L Final   Total Bilirubin  Date Value Ref Range Status  04/29/2018 0.9 0.3 - 1.2 mg/dL Final   Hematology Recent Labs  Lab 04/28/18 1039 04/29/18 0119  WBC 6.5 7.7  RBC 3.71* 4.01*  HGB 10.3* 10.5*  HCT 32.3* 34.3*  MCV 87.1 85.5  MCH 27.8 26.2  MCHC 31.9 30.6  RDW 14.7 14.7  PLT 275 314   Cardiac Enzymes Recent Labs  Lab 04/28/18 1039 04/28/18 1606 04/28/18 1956 04/29/18 0119 04/29/18 0717  TROPONINI 0.04* 0.06* 0.05* 0.05* 0.04*   No results for input(s): TROPIPOC in the last 168 hours.  BNP Recent Labs  Lab 04/28/18 1039  BNP 623.2*    DDimer No results for input(s): DDIMER in the last 168 hours. TSH:  Lab Results  Component Value Date   TSH 3.571 04/28/2018   Lipids:No results found for: CHOL, HDL, LDLCALC, LDLDIRECT, TRIG, CHOLHDL HgbA1c: Lab Results  Component Value Date   HGBA1C 6.8 (H) 04/29/2018    Radiology/Studies:  Dg Chest 2 View  Result Date: 04/28/2018 CLINICAL DATA:  Chest pain and shortness of breath for several months EXAM: CHEST - 2 VIEW COMPARISON:  04/18/2018 FINDINGS: Cardiac shadow is stable. Postsurgical  changes are again seen. Lungs are well aerated bilaterally. Small left pleural effusion is noted new from the prior study. Stable vascular congestion is noted without interstitial edema. No bony abnormality is seen. IMPRESSION: New small left pleural effusion. Stable vascular congestion without edema. Electronically Signed   By: Alcide Clever M.D.   On: 04/28/2018 11:39   Ct Angio Chest Pe W/cm &/or Wo Cm  Result Date: 04/28/2018 CLINICAL DATA:  Chest pain, shortness of breath. EXAM: CT ANGIOGRAPHY CHEST WITH CONTRAST TECHNIQUE: Multidetector CT imaging of the chest was performed using the standard protocol during bolus administration of intravenous contrast. Multiplanar CT image reconstructions and MIPs were obtained to evaluate the vascular  anatomy. CONTRAST:  ISOVUE-370 IOPAMIDOL (ISOVUE-370) INJECTION 76% COMPARISON:  CT scan of December 16, 2017. FINDINGS: Cardiovascular: Satisfactory opacification of the pulmonary arteries to the segmental level. No evidence of pulmonary embolism. Normal heart size. No pericardial effusion. Mediastinum/Nodes: Stable calcified left thyroid nodule. The esophagus is unremarkable. Stable mediastinal adenopathy is noted which most likely is reactive in etiology. Lungs/Pleura: Moderate bilateral pleural effusions are noted. No pneumothorax is noted. Mosaic airspace opacity is noted which may represent focal air trapping or possible multifocal inflammation. Upper Abdomen: Probable hepatic cirrhosis. Status post gastric bypass. Musculoskeletal: No chest wall abnormality. No acute or significant osseous findings. Review of the MIP images confirms the above findings. IMPRESSION: No definite evidence of pulmonary embolus. Moderate bilateral pleural effusions. Stable mediastinal adenopathy is noted which most likely is reactive in etiology. Mosaic airspace opacity is noted throughout both lungs which may represent focal air trapping or possibly multifocal inflammation. Probable hepatic cirrhosis. Electronically Signed   By: Lupita Raider, M.D.   On: 04/28/2018 13:46   Assessment and Plan:   1.  Acute CHF exacerbation: -Patient admitted to [facility] on 04/28/2018 with complaints of shortness of breath.  Per chart review this appears to be an ongoing problem.  He was last seen by his PCP earlier this month with similar complaints.  She was set to follow with pulmonologist for PFTs scheduled for this Thursday.  Symptoms thought to be related to recent Dx and hospitalization for pneumonia. Given ongoing symptoms and bilateral pleural effusions per chest CT angiogram,  worrisome for acute CHF exacerbation. -Last echocardiogram from 12/2017 for recent hospital admission for pneumonia with LVEF reported at 45 to 50%, no wall  motion abnormality. -Per chart review, recent weight trend between 188-190lbs -Weight, 185lb today, 190lb on admission -I&O, net -1.8 L, 24-hour total output 2.1 L -Echocardiogram with pending results today -Mild elevation in troponin level, 0.05, 0.05 with complaints of mild right-sided chest tightness -Currently IV Lasix 40 mg twice daily -Patient reports feeling much better than yesterday  2.  Elevated troponin: -Patient with complaints of exertional, nonradiating right-sided chest tightness along with his progressively worsening shortness of breath over the last several months.  Given his prior history of CABG and mild elevation in troponin, likely demand ischemia however, consider ischemic work-up with stress test or cardiac cath after echocardiogram performed for further evaluation -EKG, ST with PVCs, HR 103 and no acute ischemic ST-T wave abnormalities.  No prior EKG available for comparison -ASA, statin -Home atenolol, not started  3.  Mild hypoxia: -Resolved, patient presented with mild hypoxia.  He was given IV Lasix 40 mg in the emergency department.  Maintaining O2 saturations between 90 to 96%.  Desaturates to mid to upper 80s while sleeping. -Currently on room air with adequate saturations  4.  History  of CAD s/p CABG: -Troponin, 0.05, 0.05 with points of mild right-sided chest tightness along with progressively worsening shortness of breath -EKG, ST with PVCs, HR 103 and no acute ischemic ST-T wave abnormalities.  No prior   EKG available for comparison -Last cath reported at 12/2014 however, report not available in care everywhere -Currently denies chest pain, could consider ischemic work-up once information obtained from pending echocardiogram -ASA 81 mg, atorvastatin 20 mg  5.  DM2: -Uncontrolled, Per primary team>>SSI for glucose control -Hemoglobin A1c, 6.8 -Home glimepiride, metformin  6.  HTN: -Stable, 19/75, 119/55, 136/75 -Home atenolol 25 mg daily, not  started  For questions or updates, please contact [facility] Please consult [email or web address] for contact info under Cardiology/STEMI.   Raliegh Ip NP-[facility] Pager: [phone] 04/29/2018 8:48 AM  Patient seen, examined. Available data reviewed. Agree with findings, assessment, and plan as outlined by Georgie Chard, NP.  The patient is independently interviewed and examined.  He is a pleasant male in no distress.  His wife and daughter are at the bedside at the time of my exam.  HEENT is normal, lung fields are clear with the exception of diminished breath sounds in the bases, JVP is normal, heart is regular rate and rhythm with no murmur or gallop, abdomen is soft, nontender, nondistended, positive bowel sounds.  Extremities show trace ankle edema and no pretibial edema.  Skin is warm and dry without rash.  Lab and radiographic data is reviewed.  Notable findings include minimal troponin elevation of 0.05 mg/dL.  Renal function is normal with a creatinine of 0.91.  BNP is elevated at 623.  Chest x-ray shows vascular congestion without edema and a small left pleural effusion.  His echo has just been completed with findings outlined below:  Left ventricle:  The cavity size was normal. Wall thickness was normal. Systolic function was moderately reduced. The estimated ejection fraction was in the range of 35% to 40%. Diffuse hypokinesis. The study is not technically sufficient to allow evaluation of LV diastolic function.  ------------------------------------------------------------------- Aortic valve:   Trileaflet; normal thickness leaflets. Mobility was not restricted.  Doppler:  Transvalvular velocity was within the normal range. There was no stenosis. There was mild regurgitation.   ------------------------------------------------------------------- Aorta:  Aortic root: The aortic root was normal in  size.  ------------------------------------------------------------------- Mitral valve:   Severely calcified annulus. Mobility was not restricted.  Doppler:  Transvalvular velocity was within the normal range. There was no evidence for stenosis. There was mild regurgitation.  ------------------------------------------------------------------- Left atrium:  The atrium was mildly dilated.  ------------------------------------------------------------------- Right ventricle:  The cavity size was normal. Wall thickness was normal. Systolic function was normal.  ------------------------------------------------------------------- Pulmonic valve:   Poorly visualized.  Structurally normal valve. Cusp separation was normal.  Doppler:  Transvalvular velocity was within the normal range. There was no evidence for stenosis. There was trivial regurgitation.  ------------------------------------------------------------------- Tricuspid valve:   Structurally normal valve.    Doppler: Transvalvular velocity was within the normal range. There was trivial regurgitation.  ------------------------------------------------------------------- Pulmonary artery:   The main pulmonary artery was normal-sized. Systolic pressure was within the normal range.  ------------------------------------------------------------------- Right atrium:  The atrium was normal in size.  ------------------------------------------------------------------- Pericardium:  There was no pericardial effusion.  ------------------------------------------------------------------- Systemic veins: Inferior vena cava: The vessel was normal in size.  The patient has signs of acute systolic heart failure based on his clinical symptoms, radiographic, and lab findings.  His echo demonstrates progressive LV systolic dysfunction.  I agree that  he should continue to be treated with IV diuretics.  He has improved quickly with IV  furosemide and expected diuresis.  Equally concerning is the potential etiology for his LV dysfunction and the fact that he appears to have symptoms of exertional angina as he describes right-sided chest pressure with physical activity resolves with rest.  He is 15 years out from CABG and has ongoing cardiovascular risk factors of diabetes, hypertension, and hyperlipidemia.  I have recommended cardiac catheterization with native coronary bypass graft angiography and possible angioplasty and stenting.  Will load the patient with clopidogrel 300 mg today and start 75 mg daily tomorrow. I have reviewed the risks, indications, and alternatives to cardiac catheterization, possible angioplasty, and stenting with the patient. Risks include but are not limited to bleeding, infection, vascular injury, stroke, myocardial infection, arrhythmia, kidney injury, radiation-related injury in the case of prolonged fluoroscopy use, emergency cardiac surgery, and death. The patient understands the risks of serious complication is 1-2 in 1000 with diagnostic cardiac cath and 1-2% or less with angioplasty/stenting. All of their questions are answered today.   [name], M.D. 04/29/2018 3:47 PM

## 2018-04-29 NOTE — H&P (View-Only) (Signed)
Cardiology Consultation:   Patient ID: Alec Taylor; [id number]; 1946/06/17   Admit date: 04/28/2018 Date of Consult: 04/29/2018  Primary Care Provider: Patient, No Pcp Per Primary Cardiologist: Dr. Tamala Fothergill  Patient Profile:   Alec Taylor is a 72 y.o. male with a hx of CAD s/p CABG (approximately 2004-[facility]), HTN, HLD, DM 2 and gastric bypass who is being seen today for the evaluation of new onset shortness of breath and exertional chest tightness at the request of Dr. Katrinka Blazing.  History of Present Illness:   [name] is a 72 year old male with a history stated above who presented to [facility] on 04/28/2018 with complaints of worsening shortness of breath over the last month.  He reports being recently admitted to the hospital in 12/2017 with pneumonia.  At baseline, patient is able to play golf without issues however, has recently noticed that he gets short of breath even with walking to the car. He also has complaints of right sided, nonradiating chest tightness rated a 4/10 in severity.  He states the chest tightness is usually associated with exertion and shortness of breath and is usually relieved with rest.  He denies associated nausea, vomiting, diaphoresis, palpitations or syncope.  He also reports mild LE swelling. He states that he does not smoke cigarettes and uses occasional alcohol.  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  Patient reports no anginal symptoms since his surgery approximately 15 years ago.    In the ED, BNP was noted to be mildly elevated at 623 and an initial troponin of 0.04.  Chest x-ray revealed new small left pleural effusion.  Given his shortness of breath and tachycardia on presentation, chest CT angiogram was performed which showed no signs of PE however, moderate bilateral pleural effusions. Patient was given IV Lasix 20 mg with good results.  Of note, he saw his PCP on 04/18/2018 for similar SOB complaints. Patient was started  on albuterol, PFTs were ordered and patient was to get chest PA and lateral for pneumonia follow-up scheduled for this coming Thursday.  Last echocardiogram performed 12/2017 during his last hospital admission revealed LVEF of 45 to 50%.  Baseline weight appears to be 188-190lbs for the last 33-month time.  He has no prior history of CHF  He is followed by Dr. Beverely Pace with [facility] in [facility] and was last seen 03/27/2017 per chart review.  At that time he reported improving his exercise tolerance by increasing his activity level and going more routinely to the fitness center for exercise sessions.  He had no reports of anginal symptoms and was otherwise doing well. He reports several stress tests over the years per Dr. Beverely Pace however, I do not see these results in care everywhere.  These tests were routinely performed  Cardiology was consulted given his prior CABG history, elevated troponin and symptoms worrisome for acute CHF exacerbation.  Past Medical History:  Diagnosis Date  . CAD (coronary artery disease)    s/p CABG approximately 2004-[facility]  . COPD (chronic obstructive pulmonary disease) (HCC)   . Depression   . Diabetes mellitus without complication (HCC)   . Hypertension     Past Surgical History:  Procedure Laterality Date  . APPENDECTOMY    . CHOLECYSTECTOMY    . CORONARY ARTERY BYPASS GRAFT    . GASTRIC BYPASS       Prior to Admission medications   Medication Sig Start Date End Date Taking? Authorizing Provider  albuterol (PROVENTIL HFA;VENTOLIN  HFA) 108 (90 Base) MCG/ACT inhaler Inhale into the lungs every 6 (six) hours as needed for wheezing or shortness of breath.   Yes [provider]  ammonium lactate (LAC-HYDRIN) 12 % lotion Apply topically 2 (two) times daily as needed. To feet 04/21/18  Yes [provider]  aspirin EC 81 MG tablet Take 81 mg by mouth at bedtime.   Yes [provider]  atenolol (TENORMIN) 25 MG tablet Take 25 mg by mouth  every morning. 08/01/10  Yes [provider]  atorvastatin (LIPITOR) 20 MG tablet Take 20 mg by mouth daily. 04/08/18  Yes [provider]  bimatoprost (LATISSE) 0.03 % ophthalmic solution Place one drop on applicator and apply evenly along the skin of the upper eyelid at base of eyelashes once daily at bedtime; repeat procedure for second eye (use a clean applicator). Can also apply to the eyebrows 07/16/17  Yes [provider]  buPROPion (WELLBUTRIN XL) 150 MG 24 hr tablet Take 150 mg by mouth every morning. 03/06/18  Yes [provider]  busPIRone (BUSPAR) 15 MG tablet Take 15 mg by mouth every morning. 01/17/18  Yes [provider]  Cholecalciferol (VITAMIN D3) 5000 units CAPS Take 5,000 Units by mouth 2 (two) times daily. 12/19/17  Yes [provider]  clonazePAM (KLONOPIN) 0.5 MG tablet Take 0.5 mg by mouth at bedtime as needed. 03/06/18  Yes [provider]  donepezil (ARICEPT) 5 MG tablet Take 5 mg by mouth daily. 03/31/18  Yes [provider]  DULoxetine (CYMBALTA) 60 MG capsule Take 60 mg by mouth at bedtime. 03/06/18  Yes [provider]  finasteride (PROSCAR) 5 MG tablet Take 5 mg by mouth every evening.  04/20/18  Yes [provider]  fluocinonide (LIDEX) 0.05 % external solution Apply topically. 07/01/15  Yes [provider]  folic acid (FOLVITE) 1 MG tablet Take 1 mg by mouth daily. 04/20/18  Yes [provider]  glimepiride (AMARYL) 2 MG tablet Take 2 mg by mouth every evening. 04/20/18  Yes [provider]  hydroxychloroquine (PLAQUENIL) 200 MG tablet Take 200 mg by mouth 2 (two) times daily. 06/20/15  Yes [provider]  metFORMIN (GLUCOPHAGE-XR) 500 MG 24 hr tablet Take 500 mg by mouth 2 (two) times daily. 04/20/18  Yes [provider]  Multiple Vitamins-Minerals (MULTIVITAMIN ADULT PO) Take 1 tablet by mouth every morning.   Yes [provider]  naproxen  sodium (ALEVE) 220 MG tablet Take 220 mg by mouth 2 (two) times daily as needed.   Yes [provider]  pantoprazole (PROTONIX) 40 MG tablet Take 40 mg by mouth 2 (two) times daily. 04/20/18  Yes [provider]  saccharomyces boulardii ([employer] DIGESTIVE PROBIOTIC) 250 MG capsule Take 250 mg by mouth every morning. 06/20/15  Yes [provider]  zolpidem (AMBIEN CR) 12.5 MG CR tablet Take 12.5 mg by mouth at bedtime as needed. 03/06/18  Yes [provider]  methotrexate (RHEUMATREX) 2.5 MG tablet Take 5 mg by mouth once a week. On Wednesday 04/24/18   [provider]  ofloxacin (OCUFLOX) 0.3 % ophthalmic solution Place 1 drop into both eyes 4 (four) times daily. Prior to procedures.    [provider]    Inpatient Medications: Scheduled Meds: . aspirin EC  81 mg Oral QHS  . atorvastatin  20 mg Oral Daily  . buPROPion  150 mg Oral Daily  . busPIRone  15 mg Oral Daily  . DULoxetine  60 mg  Oral QHS  . enoxaparin (LOVENOX) injection  40 mg Subcutaneous Q24H  . finasteride  5 mg Oral QPM  . folic acid  1 mg Oral Daily  . furosemide  40 mg Intravenous BID  . insulin aspart  0-15 Units Subcutaneous TID WC  . insulin aspart  0-5 Units Subcutaneous QHS  . insulin detemir  5 Units Subcutaneous QHS  . ofloxacin  1 drop Both Eyes QID  . pantoprazole  40 mg Oral BID  . saccharomyces boulardii  250 mg Oral Daily  . sodium chloride flush  3 mL Intravenous Q12H   Continuous Infusions: . sodium chloride     PRN Meds: sodium chloride, acetaminophen, ammonium lactate, clonazePAM, sodium chloride flush, zolpidem  Allergies:    Allergies  Allergen Reactions  . Altace [Ramipril]     Cough     Social History:   Social History   Socioeconomic History  . Marital status: Legally Separated    Spouse name: Not on file  . Number of children: Not on file  . Years of education: Not on file  . Highest education level: Not on file  Occupational History    . Not on file  Social Needs  . Financial resource strain: Not on file  . Food insecurity:    Worry: Not on file    Inability: Not on file  . Transportation needs:    Medical: Not on file    Non-medical: Not on file  Tobacco Use  . Smoking status: Current Every Day Smoker  . Smokeless tobacco: Never Used  Substance and Sexual Activity  . Alcohol use: Yes    Comment: occ  . Drug use: Never  . Sexual activity: Not on file  Lifestyle  . Physical activity:    Days per week: Not on file    Minutes per session: Not on file  . Stress: Not on file  Relationships  . Social connections:    Talks on phone: Not on file    Gets together: Not on file    Attends religious service: Not on file    Active member of club or organization: Not on file    Attends meetings of clubs or organizations: Not on file    Relationship status: Not on file  . Intimate partner violence:    Fear of current or ex partner: Not on file    Emotionally abused: Not on file    Physically abused: Not on file    Forced sexual activity: Not on file  Other Topics Concern  . Not on file  Social History Narrative  . Not on file    Family History:   Family History  Problem Relation Age of Onset  . Hypertension Father    Family Status:  Family Status  Relation Name Status  . Father  (Not Specified)    ROS:  Please see the history of present illness.  All other ROS reviewed and negative.     Physical Exam/Data:   Vitals:   04/28/18 1646 04/28/18 1840 04/28/18 2050 04/29/18 0408  BP: 129/66 136/75 (!) 119/55 119/75  Pulse: 93 93 95 95  Resp: 20 18 20 20   Temp:  98.2 F (36.8 C) 97.7 F (36.5 C) 98.3 F (36.8 C)  TempSrc:  Oral    SpO2: 96% 97% 94% 92%  Weight:  188 lb 8 oz (85.5 kg)  185 lb 3.2 oz (84 kg)  Height:  5\' 10"  (1.778 m)      Intake/Output Summary (  Last 24 hours) at 04/29/2018 0848 Last data filed at 04/29/2018 0411 Gross per 24 hour  Intake 240 ml  Output 2100 ml  Net -1860 ml    Filed Weights   04/28/18 1024 04/28/18 1840 04/29/18 0408  Weight: 190 lb (86.2 kg) 188 lb 8 oz (85.5 kg) 185 lb 3.2 oz (84 kg)   Body mass index is 26.57 kg/m.   General: Well developed, well nourished, NAD Skin: Warm, dry, intact  Head: Normocephalic, clear, moist mucus membranes. Neck: Negative for carotid bruits. No JVD Lungs:Clear to ausculation bilaterally. No wheezes, rales, or rhonchi. Breathing is unlabored. Cardiovascular: RRR with S1 S2. No murmurs, rubs or gallops Abdomen: Soft, non-tender, non-distended with normoactive bowel sounds.No obvious abdominal masses. MSK: Strength and tone appear normal for age. 5/5 in all extremities Extremities: No edema. No clubbing or cyanosis. DP/PT pulses 2+ bilaterally Neuro: Alert and oriented. No focal deficits. No facial asymmetry. MAE spontaneously. Psych: Responds to questions appropriately with normal affect.    EKG:  The EKG was personally reviewed and demonstrates: 04/28/2018 ST with PVCs and no acute ischemic ST-T wave abnormalities.  There is evidence of LVH Telemetry:  Telemetry was personally reviewed and demonstrates: 04/29/2018 NSR with occasional PVCs HR 97  Relevant CV Studies:  ECHO:  Echocardiogram 12/2017: Reported as normal LV function EF 45 to 50%  Echocardiogram 04/29/2017: Pending results  CATH: Last cardiac cath reported as 12/2014 however I cannot find these reports  Laboratory Data:  Chemistry Recent Labs  Lab 04/28/18 1039 04/29/18 0119  NA 135 139  K 4.7 4.0  CL 100* 98*  CO2 25 29  GLUCOSE 302* 203*  BUN 24* 22*  CREATININE 0.97 0.91  CALCIUM 8.7* 9.3  GFRNONAA >60 >60  GFRAA >60 >60  ANIONGAP 10 12    Total Protein  Date Value Ref Range Status  04/29/2018 7.1 6.5 - 8.1 g/dL Final   Albumin  Date Value Ref Range Status  04/29/2018 3.6 3.5 - 5.0 g/dL Final   AST  Date Value Ref Range Status  04/29/2018 34 15 - 41 U/L Final   ALT  Date Value Ref Range Status  04/29/2018 36 17 - 63  U/L Final   Alkaline Phosphatase  Date Value Ref Range Status  04/29/2018 90 38 - 126 U/L Final   Total Bilirubin  Date Value Ref Range Status  04/29/2018 0.9 0.3 - 1.2 mg/dL Final   Hematology Recent Labs  Lab 04/28/18 1039 04/29/18 0119  WBC 6.5 7.7  RBC 3.71* 4.01*  HGB 10.3* 10.5*  HCT 32.3* 34.3*  MCV 87.1 85.5  MCH 27.8 26.2  MCHC 31.9 30.6  RDW 14.7 14.7  PLT 275 314   Cardiac Enzymes Recent Labs  Lab 04/28/18 1039 04/28/18 1606 04/28/18 1956 04/29/18 0119 04/29/18 0717  TROPONINI 0.04* 0.06* 0.05* 0.05* 0.04*   No results for input(s): TROPIPOC in the last 168 hours.  BNP Recent Labs  Lab 04/28/18 1039  BNP 623.2*    DDimer No results for input(s): DDIMER in the last 168 hours. TSH:  Lab Results  Component Value Date   TSH 3.571 04/28/2018   Lipids:No results found for: CHOL, HDL, LDLCALC, LDLDIRECT, TRIG, CHOLHDL HgbA1c: Lab Results  Component Value Date   HGBA1C 6.8 (H) 04/29/2018    Radiology/Studies:  Dg Chest 2 View  Result Date: 04/28/2018 CLINICAL DATA:  Chest pain and shortness of breath for several months EXAM: CHEST - 2 VIEW COMPARISON:  04/18/2018 FINDINGS: Cardiac shadow is stable. Postsurgical  changes are again seen. Lungs are well aerated bilaterally. Small left pleural effusion is noted new from the prior study. Stable vascular congestion is noted without interstitial edema. No bony abnormality is seen. IMPRESSION: New small left pleural effusion. Stable vascular congestion without edema. Electronically Signed   By: Alcide Clever M.D.   On: 04/28/2018 11:39   Ct Angio Chest Pe W/cm &/or Wo Cm  Result Date: 04/28/2018 CLINICAL DATA:  Chest pain, shortness of breath. EXAM: CT ANGIOGRAPHY CHEST WITH CONTRAST TECHNIQUE: Multidetector CT imaging of the chest was performed using the standard protocol during bolus administration of intravenous contrast. Multiplanar CT image reconstructions and MIPs were obtained to evaluate the vascular  anatomy. CONTRAST:  ISOVUE-370 IOPAMIDOL (ISOVUE-370) INJECTION 76% COMPARISON:  CT scan of December 16, 2017. FINDINGS: Cardiovascular: Satisfactory opacification of the pulmonary arteries to the segmental level. No evidence of pulmonary embolism. Normal heart size. No pericardial effusion. Mediastinum/Nodes: Stable calcified left thyroid nodule. The esophagus is unremarkable. Stable mediastinal adenopathy is noted which most likely is reactive in etiology. Lungs/Pleura: Moderate bilateral pleural effusions are noted. No pneumothorax is noted. Mosaic airspace opacity is noted which may represent focal air trapping or possible multifocal inflammation. Upper Abdomen: Probable hepatic cirrhosis. Status post gastric bypass. Musculoskeletal: No chest wall abnormality. No acute or significant osseous findings. Review of the MIP images confirms the above findings. IMPRESSION: No definite evidence of pulmonary embolus. Moderate bilateral pleural effusions. Stable mediastinal adenopathy is noted which most likely is reactive in etiology. Mosaic airspace opacity is noted throughout both lungs which may represent focal air trapping or possibly multifocal inflammation. Probable hepatic cirrhosis. Electronically Signed   By: Lupita Raider, M.D.   On: 04/28/2018 13:46   Assessment and Plan:   1.  Acute CHF exacerbation: -Patient admitted to [facility] on 04/28/2018 with complaints of shortness of breath.  Per chart review this appears to be an ongoing problem.  He was last seen by his PCP earlier this month with similar complaints.  She was set to follow with pulmonologist for PFTs scheduled for this Thursday.  Symptoms thought to be related to recent Dx and hospitalization for pneumonia. Given ongoing symptoms and bilateral pleural effusions per chest CT angiogram,  worrisome for acute CHF exacerbation. -Last echocardiogram from 12/2017 for recent hospital admission for pneumonia with LVEF reported at 45 to 50%, no wall  motion abnormality. -Per chart review, recent weight trend between 188-190lbs -Weight, 185lb today, 190lb on admission -I&O, net -1.8 L, 24-hour total output 2.1 L -Echocardiogram with pending results today -Mild elevation in troponin level, 0.05, 0.05 with complaints of mild right-sided chest tightness -Currently IV Lasix 40 mg twice daily -Patient reports feeling much better than yesterday  2.  Elevated troponin: -Patient with complaints of exertional, nonradiating right-sided chest tightness along with his progressively worsening shortness of breath over the last several months.  Given his prior history of CABG and mild elevation in troponin, likely demand ischemia however, consider ischemic work-up with stress test or cardiac cath after echocardiogram performed for further evaluation -EKG, ST with PVCs, HR 103 and no acute ischemic ST-T wave abnormalities.  No prior EKG available for comparison -ASA, statin -Home atenolol, not started  3.  Mild hypoxia: -Resolved, patient presented with mild hypoxia.  He was given IV Lasix 40 mg in the emergency department.  Maintaining O2 saturations between 90 to 96%.  Desaturates to mid to upper 80s while sleeping. -Currently on room air with adequate saturations  4.  History  of CAD s/p CABG: -Troponin, 0.05, 0.05 with points of mild right-sided chest tightness along with progressively worsening shortness of breath -EKG, ST with PVCs, HR 103 and no acute ischemic ST-T wave abnormalities.  No prior   EKG available for comparison -Last cath reported at 12/2014 however, report not available in care everywhere -Currently denies chest pain, could consider ischemic work-up once information obtained from pending echocardiogram -ASA 81 mg, atorvastatin 20 mg  5.  DM2: -Uncontrolled, Per primary team>>SSI for glucose control -Hemoglobin A1c, 6.8 -Home glimepiride, metformin  6.  HTN: -Stable, 19/75, 119/55, 136/75 -Home atenolol 25 mg daily, not  started  For questions or updates, please contact [facility] Please consult [email or web address] for contact info under Cardiology/STEMI.   Raliegh Ip NP-[facility] Pager: [phone] 04/29/2018 8:48 AM  Patient seen, examined. Available data reviewed. Agree with findings, assessment, and plan as outlined by Georgie Chard, NP.  The patient is independently interviewed and examined.  He is a pleasant male in no distress.  His wife and daughter are at the bedside at the time of my exam.  HEENT is normal, lung fields are clear with the exception of diminished breath sounds in the bases, JVP is normal, heart is regular rate and rhythm with no murmur or gallop, abdomen is soft, nontender, nondistended, positive bowel sounds.  Extremities show trace ankle edema and no pretibial edema.  Skin is warm and dry without rash.  Lab and radiographic data is reviewed.  Notable findings include minimal troponin elevation of 0.05 mg/dL.  Renal function is normal with a creatinine of 0.91.  BNP is elevated at 623.  Chest x-ray shows vascular congestion without edema and a small left pleural effusion.  His echo has just been completed with findings outlined below:  Left ventricle:  The cavity size was normal. Wall thickness was normal. Systolic function was moderately reduced. The estimated ejection fraction was in the range of 35% to 40%. Diffuse hypokinesis. The study is not technically sufficient to allow evaluation of LV diastolic function.  ------------------------------------------------------------------- Aortic valve:   Trileaflet; normal thickness leaflets. Mobility was not restricted.  Doppler:  Transvalvular velocity was within the normal range. There was no stenosis. There was mild regurgitation.   ------------------------------------------------------------------- Aorta:  Aortic root: The aortic root was normal in  size.  ------------------------------------------------------------------- Mitral valve:   Severely calcified annulus. Mobility was not restricted.  Doppler:  Transvalvular velocity was within the normal range. There was no evidence for stenosis. There was mild regurgitation.  ------------------------------------------------------------------- Left atrium:  The atrium was mildly dilated.  ------------------------------------------------------------------- Right ventricle:  The cavity size was normal. Wall thickness was normal. Systolic function was normal.  ------------------------------------------------------------------- Pulmonic valve:   Poorly visualized.  Structurally normal valve. Cusp separation was normal.  Doppler:  Transvalvular velocity was within the normal range. There was no evidence for stenosis. There was trivial regurgitation.  ------------------------------------------------------------------- Tricuspid valve:   Structurally normal valve.    Doppler: Transvalvular velocity was within the normal range. There was trivial regurgitation.  ------------------------------------------------------------------- Pulmonary artery:   The main pulmonary artery was normal-sized. Systolic pressure was within the normal range.  ------------------------------------------------------------------- Right atrium:  The atrium was normal in size.  ------------------------------------------------------------------- Pericardium:  There was no pericardial effusion.  ------------------------------------------------------------------- Systemic veins: Inferior vena cava: The vessel was normal in size.  The patient has signs of acute systolic heart failure based on his clinical symptoms, radiographic, and lab findings.  His echo demonstrates progressive LV systolic dysfunction.  I agree that  he should continue to be treated with IV diuretics.  He has improved quickly with IV  furosemide and expected diuresis.  Equally concerning is the potential etiology for his LV dysfunction and the fact that he appears to have symptoms of exertional angina as he describes right-sided chest pressure with physical activity resolves with rest.  He is 15 years out from CABG and has ongoing cardiovascular risk factors of diabetes, hypertension, and hyperlipidemia.  I have recommended cardiac catheterization with native coronary bypass graft angiography and possible angioplasty and stenting.  Will load the patient with clopidogrel 300 mg today and start 75 mg daily tomorrow. I have reviewed the risks, indications, and alternatives to cardiac catheterization, possible angioplasty, and stenting with the patient. Risks include but are not limited to bleeding, infection, vascular injury, stroke, myocardial infection, arrhythmia, kidney injury, radiation-related injury in the case of prolonged fluoroscopy use, emergency cardiac surgery, and death. The patient understands the risks of serious complication is 1-2 in 1000 with diagnostic cardiac cath and 1-2% or less with angioplasty/stenting. All of their questions are answered today.   [name], M.D. 04/29/2018 3:47 PM

## 2018-04-29 NOTE — Progress Notes (Signed)
Echocardiogram 2D Echocardiogram has been performed.  04/29/2018 3:16 PM Gertie Fey, BS, RVT, RDCS, RDMS

## 2018-04-30 ENCOUNTER — Encounter (HOSPITAL_COMMUNITY)
Admission: EM | Disposition: A | Discharge status: Home / Self Care | Payer: Self-pay | Source: Home / Self Care | Attending: Internal Medicine

## 2018-04-30 ENCOUNTER — Encounter (HOSPITAL_COMMUNITY): Payer: Self-pay | Admitting: Cardiology

## 2018-04-30 DIAGNOSIS — E119 Type 2 diabetes mellitus without complications: Secondary | ICD-10-CM

## 2018-04-30 DIAGNOSIS — I2510 Atherosclerotic heart disease of native coronary artery without angina pectoris: Secondary | ICD-10-CM

## 2018-04-30 DIAGNOSIS — I5021 Acute systolic (congestive) heart failure: Secondary | ICD-10-CM

## 2018-04-30 DIAGNOSIS — I10 Essential (primary) hypertension: Secondary | ICD-10-CM

## 2018-04-30 HISTORY — PX: LEFT HEART CATH AND CORS/GRAFTS ANGIOGRAPHY: CATH118250

## 2018-04-30 LAB — CREATININE, SERUM
CREATININE: 1.03 mg/dL (ref 0.61–1.24)
GFR calc Af Amer: >60 mL/min (ref >60)
GFR calc non Af Amer: >60 mL/min (ref >60)

## 2018-04-30 LAB — GLUCOSE, CAPILLARY
GLUCOSE-CAPILLARY: 188 mg/dL — AB (ref 65–99)
GLUCOSE-CAPILLARY: 177 mg/dL — AB (ref 65–99)
Glucose-Capillary: 155 mg/dL — ABNORMAL HIGH (ref 65–99)
Glucose-Capillary: 250 mg/dL — ABNORMAL HIGH (ref 65–99)

## 2018-04-30 LAB — CBC
HCT: 33.6 % — ABNORMAL LOW (ref 39.0–52.0)
Hemoglobin: 10.2 g/dL — ABNORMAL LOW (ref 13.0–17.0)
MCH: 26 pg (ref 26.0–34.0)
MCHC: 30.4 g/dL (ref 30.0–36.0)
MCV: 85.7 fL (ref 78.0–100.0)
Platelets: 298 10*3/uL (ref 150–400)
RBC: 3.92 MIL/uL — AB (ref 4.22–5.81)
RDW: 14.4 % (ref 11.5–15.5)
WBC: 6.5 10*3/uL (ref 4.0–10.5)

## 2018-04-30 SURGERY — LEFT HEART CATH AND CORS/GRAFTS ANGIOGRAPHY
Anesthesia: LOCAL

## 2018-04-30 MED ORDER — LIDOCAINE HCL (PF) 1 % IJ SOLN
INTRAMUSCULAR | Status: AC
  Filled 2018-04-30: qty 30

## 2018-04-30 MED ORDER — INSULIN DETEMIR 100 UNIT/ML ~~LOC~~ SOLN
4.0000 [IU] | Freq: Two times a day (BID) | SUBCUTANEOUS | Status: DC
  Administered 2018-04-30 – 2018-05-01 (×2): 4 [IU] via SUBCUTANEOUS
  Filled 2018-04-30 (×4): qty 0.04

## 2018-04-30 MED ORDER — HEPARIN SODIUM (PORCINE) 1000 UNIT/ML IJ SOLN
INTRAMUSCULAR | Status: DC | PRN
  Administered 2018-04-30: 4000 [IU] via INTRAVENOUS

## 2018-04-30 MED ORDER — FENTANYL CITRATE (PF) 100 MCG/2ML IJ SOLN
INTRAMUSCULAR | Status: DC | PRN
  Administered 2018-04-30: 25 ug via INTRAVENOUS

## 2018-04-30 MED ORDER — HEPARIN (PORCINE) IN NACL 2-0.9 UNITS/ML
INTRAMUSCULAR | Status: AC | PRN
  Administered 2018-04-30 (×2): 500 mL

## 2018-04-30 MED ORDER — FENTANYL CITRATE (PF) 100 MCG/2ML IJ SOLN
INTRAMUSCULAR | Status: AC
  Filled 2018-04-30: qty 2

## 2018-04-30 MED ORDER — IOHEXOL 350 MG/ML SOLN
INTRAVENOUS | Status: DC | PRN
  Administered 2018-04-30: 120 mL via INTRAVENOUS

## 2018-04-30 MED ORDER — SODIUM CHLORIDE 0.9 % IV SOLN: INTRAVENOUS | Status: AC

## 2018-04-30 MED ORDER — MIDAZOLAM HCL 2 MG/2ML IJ SOLN
INTRAMUSCULAR | Status: DC | PRN
  Administered 2018-04-30: 2 mg via INTRAVENOUS

## 2018-04-30 MED ORDER — SODIUM CHLORIDE 0.9% FLUSH: 3.0000 mL | INTRAVENOUS | Status: DC | PRN

## 2018-04-30 MED ORDER — HEPARIN (PORCINE) IN NACL 1000-0.9 UT/500ML-% IV SOLN
INTRAVENOUS | Status: AC
  Filled 2018-04-30: qty 1000

## 2018-04-30 MED ORDER — SODIUM CHLORIDE 0.9 % IV SOLN: 250.0000 mL | INTRAVENOUS | Status: DC | PRN

## 2018-04-30 MED ORDER — HEPARIN (PORCINE) IN NACL 2-0.9 UNITS/ML
INTRAMUSCULAR | Status: DC | PRN
  Administered 2018-04-30: 10 mL via INTRA_ARTERIAL

## 2018-04-30 MED ORDER — SODIUM CHLORIDE 0.9% FLUSH
3.0000 mL | Freq: Two times a day (BID) | INTRAVENOUS | Status: DC
  Administered 2018-04-30: 3 mL via INTRAVENOUS

## 2018-04-30 MED ORDER — LIDOCAINE HCL (PF) 1 % IJ SOLN
INTRAMUSCULAR | Status: DC | PRN
  Administered 2018-04-30: 2 mL

## 2018-04-30 MED ORDER — MIDAZOLAM HCL 2 MG/2ML IJ SOLN
INTRAMUSCULAR | Status: AC
  Filled 2018-04-30: qty 2

## 2018-04-30 MED ORDER — HEPARIN SODIUM (PORCINE) 1000 UNIT/ML IJ SOLN
INTRAMUSCULAR | Status: AC
  Filled 2018-04-30: qty 1

## 2018-04-30 MED ORDER — ONDANSETRON HCL 4 MG/2ML IJ SOLN: 4.0000 mg | Freq: Four times a day (QID) | INTRAMUSCULAR | Status: DC | PRN

## 2018-04-30 MED ORDER — VERAPAMIL HCL 2.5 MG/ML IV SOLN
INTRAVENOUS | Status: AC
  Filled 2018-04-30: qty 2

## 2018-04-30 SURGICAL SUPPLY — 12 items
CATH INFINITI 5 FR IM (CATHETERS) ×2 IMPLANT
CATH INFINITI 5FR ANG PIGTAIL (CATHETERS) ×2 IMPLANT
CATH OPTITORQUE TIG 4.0 5F (CATHETERS) ×2 IMPLANT
DEVICE RAD COMP TR BAND LRG (VASCULAR PRODUCTS) ×2 IMPLANT
GLIDESHEATH SLEND A-KIT 6F 22G (SHEATH) ×2 IMPLANT
GUIDEWIRE INQWIRE 1.5J.035X260 (WIRE) ×1 IMPLANT
INQWIRE 1.5J .035X260CM (WIRE) ×2
KIT HEART LEFT (KITS) ×2 IMPLANT
PACK CARDIAC CATHETERIZATION (CUSTOM PROCEDURE TRAY) ×2 IMPLANT
SYR MEDRAD MARK V 150ML (SYRINGE) ×2 IMPLANT
TRANSDUCER W/STOPCOCK (MISCELLANEOUS) ×2 IMPLANT
TUBING CIL FLEX 10 FLL-RA (TUBING) ×2 IMPLANT

## 2018-04-30 NOTE — Progress Notes (Signed)
Patient is alert and oriented left wrist cath level 0 with coban, vital stable belonging with in reach.

## 2018-04-30 NOTE — Interval H&P Note (Signed)
History and Physical Interval Note:  04/30/2018 10:32 AM  Alec Taylor  has presented today for surgery, with the diagnosis of New Dx of Cardiomyopathy - Acute Systolic & Diastolic CHF, Known CAD-CABG,M, ELEVATED TROPONIN (DEMAND ISCHEMIA).  The various methods of treatment have been discussed with the patient and family. After consideration of risks, benefits and other options for treatment, the patient has consented to  Procedure(s): LEFT HEART CATH AND CORS/GRAFTS ANGIOGRAPHY (N/A) with possible PERCUTANEOUS CORONARY INTERVENTION as a surgical intervention .  The patient's history has been reviewed, patient examined, no change in status, stable for surgery.  I have reviewed the patient's chart and labs.  Questions were answered to the patient's satisfaction.    Cath Lab Visit (complete for each Cath Lab visit)  Clinical Evaluation Leading to the Procedure:   ACS: No.  Non-ACS:    Anginal Classification: CCS III  Anti-ischemic medical therapy: Minimal Therapy (1 class of medications)  Non-Invasive Test Results: Intermediate-risk stress test findings: cardiac mortality 1-3%/year ; abnormal Echo  Prior CABG: Previous CABG  Bryan Lemma

## 2018-04-30 NOTE — Progress Notes (Signed)
TR band removed at 1315. Site level zero. Dressing applied. Patient teaching done.

## 2018-04-30 NOTE — Progress Notes (Signed)
[facility] PROGRESS NOTE    Progress Note  Alec Taylor  QBH:[id number] DOB: 1946-11-02 DOA: 04/28/2018 PCP: Patient, No Pcp Per     Brief Narrative:   Alec Taylor is an 72 y.o. male past medical history of hypertension status post CABG 2014, diabetes mellitus type 2 last A1c of 6.8, also with a history of gastric bypass comes to the ED complaining of shortness of breath with exertion that has progressively gotten worse for 1 month accompanied by lower extremity edema, in the ED his chest x-ray showed small left pleural effusion with a BNP of 623 cardiac biomarkers at 0.04.  Assessment/Plan:   Acute systolic CHF (congestive heart failure) (HCC): CT angios showed no PE mild pulmonary edema with left-sided effusion. A 2D echo done on 08/30/2018 showed a decrease EF of 35 to 40% compared to his previous echo in January, with diffuse hypokinesia. Hold lasix today He is about 4 L negative. His weight on admission was 84 kg, which has trended now with diuresis and is now 81.3, which is below his estimated dry weight. Cardiac biomarkers have remained flat. Basic metabolic panel is pending this morning we will continue to monitor and replete electrolyte as needed.  Elevated cardiac biomarkers/coronary artery disease with history of CABG: With his chest tightness, PVCs on EKG no signs of ischemia no events on telemetry. Continue aspirin statins atenolol. Cardiology was consulted who recommended invasive intervention he has been loaded with Plavix.  Mild hypoxia: Saturation is low as 86% now improved.  Prolonged QTc: On admission 502, will keep potassium greater than 4 magnesium greater than 2.  Repeated twelve-lead EKG    Left-sided pleural effusion Repeat x-ray tomorrow to see if his pleural effusion has improved.  Diabetes mellitus type 2: With an A1c of 6.8 continue to hold oral hypoglycemic agents. Continue CBGs before meals and at bedtime. Increase long-acting insulin  continue sliding scale.  Anxiety and depression: Continue Cymbalta, BuSpar and Wellbutrin.  Alopecia: Continue methotrexate, hold Plaquenil to prolonged QTc.   DVT prophylaxis: lovenox Family Communication:wife Disposition Plan/Barrier to D/C: to [facility] for carduiac cath Code Status:     Code Status Orders  (From admission, onward)        Start     Ordered   04/28/18 1937  Full code  Continuous     04/28/18 1942    Code Status History    This patient has a current code status but no historical code status.        IV Access:    Peripheral IV   Procedures and diagnostic studies:   Dg Chest 2 View  Result Date: 04/28/2018 CLINICAL DATA:  Chest pain and shortness of breath for several months EXAM: CHEST - 2 VIEW COMPARISON:  04/18/2018 FINDINGS: Cardiac shadow is stable. Postsurgical changes are again seen. Lungs are well aerated bilaterally. Small left pleural effusion is noted new from the prior study. Stable vascular congestion is noted without interstitial edema. No bony abnormality is seen. IMPRESSION: New small left pleural effusion. Stable vascular congestion without edema. Electronically Signed   By: Alcide Clever M.D.   On: 04/28/2018 11:39   Ct Angio Chest Pe W/cm &/or Wo Cm  Result Date: 04/28/2018 CLINICAL DATA:  Chest pain, shortness of breath. EXAM: CT ANGIOGRAPHY CHEST WITH CONTRAST TECHNIQUE: Multidetector CT imaging of the chest was performed using the standard protocol during bolus administration of intravenous contrast. Multiplanar CT image reconstructions and MIPs were obtained to evaluate the vascular anatomy. CONTRAST:  ISOVUE-370  IOPAMIDOL (ISOVUE-370) INJECTION 76% COMPARISON:  CT scan of December 16, 2017. FINDINGS: Cardiovascular: Satisfactory opacification of the pulmonary arteries to the segmental level. No evidence of pulmonary embolism. Normal heart size. No pericardial effusion. Mediastinum/Nodes: Stable calcified left thyroid nodule. The  esophagus is unremarkable. Stable mediastinal adenopathy is noted which most likely is reactive in etiology. Lungs/Pleura: Moderate bilateral pleural effusions are noted. No pneumothorax is noted. Mosaic airspace opacity is noted which may represent focal air trapping or possible multifocal inflammation. Upper Abdomen: Probable hepatic cirrhosis. Status post gastric bypass. Musculoskeletal: No chest wall abnormality. No acute or significant osseous findings. Review of the MIP images confirms the above findings. IMPRESSION: No definite evidence of pulmonary embolus. Moderate bilateral pleural effusions. Stable mediastinal adenopathy is noted which most likely is reactive in etiology. Mosaic airspace opacity is noted throughout both lungs which may represent focal air trapping or possibly multifocal inflammation. Probable hepatic cirrhosis. Electronically Signed   By: Lupita Raider, M.D.   On: 04/28/2018 13:46     Medical Consultants:    None.  Anti-Infectives:   None  Subjective:    [name] he relates his shortness of breath is much better he is able to lay flat to sleep denies any chest pain  Objective:    Vitals:   04/29/18 1326 04/29/18 1608 04/29/18 2230 04/30/18 0540  BP: 130/73  120/62 133/73  Pulse: 90  94 (!) 103  Resp: 17  20 18   Temp:   98 F (36.7 C) 98.4 F (36.9 C)  TempSrc:   Oral Oral  SpO2: 98%  94% 91%  Weight:  82.5 kg (181 lb 14.4 oz)  81.3 kg (179 lb 3.2 oz)  Height:        Intake/Output Summary (Last 24 hours) at 04/30/2018 0815 Last data filed at 04/30/2018 0554 Gross per 24 hour  Intake 482.5 ml  Output 3150 ml  Net -2667.5 ml   Filed Weights   04/29/18 0408 04/29/18 1608 04/30/18 0540  Weight: 84 kg (185 lb 3.2 oz) 82.5 kg (181 lb 14.4 oz) 81.3 kg (179 lb 3.2 oz)    Exam: General exam: In no acute distress. Respiratory system: Good air movement with crackles on the left lower lobe Cardiovascular system: S1 & S2 heard, RRR.  No  JVD Gastrointestinal system: Abdomen is nondistended, soft and nontender.  Central nervous system: Alert and oriented. No focal neurological deficits. Extremities: No pedal edema. Skin: No rashes, lesions or ulcers Psychiatry: Judgement and insight appear normal. Mood & affect appropriate.    Data Reviewed:    Labs: Basic Metabolic Panel: Recent Labs  Lab 04/28/18 1039 04/28/18 1956 04/29/18 0119  NA 135  --  139  K 4.7  --  4.0  CL 100*  --  98*  CO2 25  --  29  GLUCOSE 302*  --  203*  BUN 24*  --  22*  CREATININE 0.97  --  0.91  CALCIUM 8.7*  --  9.3  MG  --  1.9  --    GFR Estimated Creatinine Clearance: 78 mL/min (by C-G formula based on SCr of 0.91 mg/dL). Liver Function Tests: Recent Labs  Lab 04/29/18 0119  AST 34  ALT 36  ALKPHOS 90  BILITOT 0.9  PROT 7.1  ALBUMIN 3.6   No results for input(s): LIPASE, AMYLASE in the last 168 hours. No results for input(s): AMMONIA in the last 168 hours. Coagulation profile No results for input(s): INR, PROTIME in the last 168 hours.  CBC: Recent Labs  Lab 04/28/18 1039 04/29/18 0119  WBC 6.5 7.7  NEUTROABS 4.9 5.5  HGB 10.3* 10.5*  HCT 32.3* 34.3*  MCV 87.1 85.5  PLT 275 314   Cardiac Enzymes: Recent Labs  Lab 04/28/18 1039 04/28/18 1606 04/28/18 1956 04/29/18 0119 04/29/18 0717  TROPONINI 0.04* 0.06* 0.05* 0.05* 0.04*   BNP (last 3 results) No results for input(s): PROBNP in the last 8760 hours. CBG: Recent Labs  Lab 04/29/18 0729 04/29/18 1130 04/29/18 1643 04/29/18 2147 04/30/18 0725  GLUCAP 184* 210* 177* 176* 188*   D-Dimer: No results for input(s): DDIMER in the last 72 hours. Hgb A1c: Recent Labs    04/29/18 0119  HGBA1C 6.8*   Lipid Profile: No results for input(s): CHOL, HDL, LDLCALC, TRIG, CHOLHDL, LDLDIRECT in the last 72 hours. Thyroid function studies: Recent Labs    04/28/18 1956  TSH 3.571   Anemia work up: No results for input(s): VITAMINB12, FOLATE, FERRITIN,  TIBC, IRON, RETICCTPCT in the last 72 hours. Sepsis Labs: Recent Labs  Lab 04/28/18 1039 04/29/18 0119  WBC 6.5 7.7   Microbiology No results found for this or any previous visit (from the past 240 hour(s)).   Medications:   . aspirin EC  81 mg Oral QHS  . atorvastatin  20 mg Oral Daily  . buPROPion  150 mg Oral Daily  . busPIRone  15 mg Oral Daily  . clopidogrel  75 mg Oral Daily  . DULoxetine  60 mg Oral QHS  . enoxaparin (LOVENOX) injection  40 mg Subcutaneous Q24H  . finasteride  5 mg Oral QPM  . folic acid  1 mg Oral Daily  . furosemide  40 mg Intravenous BID  . insulin aspart  0-15 Units Subcutaneous TID WC  . insulin aspart  0-5 Units Subcutaneous QHS  . insulin detemir  5 Units Subcutaneous QHS  . ofloxacin  1 drop Both Eyes QID  . pantoprazole  40 mg Oral BID  . saccharomyces boulardii  250 mg Oral Daily  . sodium chloride flush  3 mL Intravenous Q12H  . sodium chloride flush  3 mL Intravenous Q12H   Continuous Infusions: . sodium chloride    . sodium chloride    . sodium chloride 10 mL/hr at 04/30/18 0539      LOS: 2 days   Marinda Elk  [facility] Pager [phone]  *Please refer to [email or web address], password [email or web address] to get updated schedule on who will round on this patient, as hospitalists switch teams weekly. If 7PM-7AM, please contact night-coverage at [email or web address], password [email or web address] for any overnight needs.  04/30/2018, 8:15 AM

## 2018-04-30 NOTE — Plan of Care (Signed)
  Problem: Activity: Goal: Risk for activity intolerance will decrease Outcome: Progressing   Problem: Elimination: Goal: Will not experience complications related to bowel motility Outcome: Progressing   Problem: Safety: Goal: Ability to remain free from injury will improve Outcome: Progressing   

## 2018-04-30 NOTE — Progress Notes (Signed)
Progress Note  Patient Name: Alec Taylor Date of Encounter: 04/30/2018  Primary Cardiologist: No primary care provider on file.   Subjective   Patient seen in the recovery area after undergoing cardiac catheterization.  His wife is at the bedside.  He denies chest pain or shortness of breath.  He is sitting up eating lunch.  Inpatient Medications    Scheduled Meds: . [MAR Hold] aspirin EC  81 mg Oral QHS  . [MAR Hold] atorvastatin  20 mg Oral Daily  . [MAR Hold] buPROPion  150 mg Oral Daily  . [MAR Hold] busPIRone  15 mg Oral Daily  . [MAR Hold] clopidogrel  75 mg Oral Daily  . [MAR Hold] DULoxetine  60 mg Oral QHS  . [MAR Hold] enoxaparin (LOVENOX) injection  40 mg Subcutaneous Q24H  . [MAR Hold] finasteride  5 mg Oral QPM  . [MAR Hold] folic acid  1 mg Oral Daily  . [MAR Hold] furosemide  40 mg Intravenous BID  . [MAR Hold] insulin aspart  0-15 Units Subcutaneous TID WC  . [MAR Hold] insulin aspart  0-5 Units Subcutaneous QHS  . [MAR Hold] insulin detemir  4 Units Subcutaneous BID  . [MAR Hold] ofloxacin  1 drop Both Eyes QID  . [MAR Hold] pantoprazole  40 mg Oral BID  . [MAR Hold] saccharomyces boulardii  250 mg Oral Daily  . [MAR Hold] sodium chloride flush  3 mL Intravenous Q12H  . sodium chloride flush  3 mL Intravenous Q12H   Continuous Infusions: . [MAR Hold] sodium chloride    . sodium chloride    . sodium chloride 10 mL/hr at 04/30/18 0539  . sodium chloride 50 mL/hr at 04/30/18 1156   PRN Meds: [MAR Hold] sodium chloride, sodium chloride, [MAR Hold] acetaminophen, [MAR Hold] ammonium lactate, [MAR Hold] clonazePAM, [MAR Hold] sodium chloride flush, sodium chloride flush, [MAR Hold] zolpidem   Vital Signs    Vitals:   04/30/18 1400 04/30/18 1405 04/30/18 1410 04/30/18 1415  BP:   (!) 130/57   Pulse:      Resp: 19 19 18  (!) 21  Temp:      TempSrc:      SpO2: 92% 91% 93% 94%  Weight:      Height:        Intake/Output Summary (Last 24 hours) at  04/30/2018 1504 Last data filed at 04/30/2018 1200 Gross per 24 hour  Intake 242.5 ml  Output 2800 ml  Net -2557.5 ml   Filed Weights   04/29/18 0408 04/29/18 1608 04/30/18 0540  Weight: 185 lb 3.2 oz (84 kg) 181 lb 14.4 oz (82.5 kg) 179 lb 3.2 oz (81.3 kg)    Telemetry    Normal sinus rhythm- Personally Reviewed   Physical Exam  Alert, oriented male in no distress GEN: No acute distress.   Neck: No JVD Cardiac: RRR, no murmurs, rubs, or gallops.  Respiratory: Clear to auscultation bilaterally. GI: Soft, nontender, non-distended  MS: No edema; No deformity.  Left radial site with a dressing in place, no hematoma Neuro:  Nonfocal  Psych: Normal affect   Labs    Chemistry Recent Labs  Lab 04/28/18 1039 04/29/18 0119  NA 135 139  K 4.7 4.0  CL 100* 98*  CO2 25 29  GLUCOSE 302* 203*  BUN 24* 22*  CREATININE 0.97 0.91  CALCIUM 8.7* 9.3  PROT  --  7.1  ALBUMIN  --  3.6  AST  --  34  ALT  --  36  ALKPHOS  --  90  BILITOT  --  0.9  GFRNONAA >60 >60  GFRAA >60 >60  ANIONGAP 10 12     Hematology Recent Labs  Lab 04/28/18 1039 04/29/18 0119  WBC 6.5 7.7  RBC 3.71* 4.01*  HGB 10.3* 10.5*  HCT 32.3* 34.3*  MCV 87.1 85.5  MCH 27.8 26.2  MCHC 31.9 30.6  RDW 14.7 14.7  PLT 275 314    Cardiac Enzymes Recent Labs  Lab 04/28/18 1606 04/28/18 1956 04/29/18 0119 04/29/18 0717  TROPONINI 0.06* 0.05* 0.05* 0.04*   No results for input(s): TROPIPOC in the last 168 hours.   BNP Recent Labs  Lab 04/28/18 1039  BNP 623.2*     DDimer No results for input(s): DDIMER in the last 168 hours.   Radiology    No results found.  Cardiac Studies   Echo: Study Conclusions  - Left ventricle: The cavity size was normal. Wall thickness was   normal. Systolic function was moderately reduced. The estimated   ejection fraction was in the range of 35% to 40%. Diffuse   hypokinesis. The study is not technically sufficient to allow   evaluation of LV diastolic  function. - Aortic valve: There was mild regurgitation. - Mitral valve: Severely calcified annulus. There was mild   regurgitation. - Left atrium: The atrium was mildly dilated. - Tricuspid valve: There was trivial regurgitation.  ------------------------------------------------------------------- Labs, prior tests, procedures, and surgery: Coronary artery bypass grafting.  ------------------------------------------------------------------- Study data:  No prior study was available for comparison.  Study status:  Routine.  Procedure:  The patient reported no pain pre or post test. Transthoracic echocardiography. Image quality was adequate.  Study completion:  There were no complications. Transthoracic echocardiography.  M-mode, complete 2D, spectral Doppler, and color Doppler.  Birthdate:  Patient birthdate: November 29, 1946.  Age:  Patient is 72 yr old.  Sex:  Gender: male. BMI: 26.6 kg/m^2.  Blood pressure:     130/73  Patient status: Inpatient.  Study date:  Study date: 04/29/2018. Study time: 02:41 PM.  Location:  Bedside.  -------------------------------------------------------------------  ------------------------------------------------------------------- Left ventricle:  The cavity size was normal. Wall thickness was normal. Systolic function was moderately reduced. The estimated ejection fraction was in the range of 35% to 40%. Diffuse hypokinesis. The study is not technically sufficient to allow evaluation of LV diastolic function.  ------------------------------------------------------------------- Aortic valve:   Trileaflet; normal thickness leaflets. Mobility was not restricted.  Doppler:  Transvalvular velocity was within the normal range. There was no stenosis. There was mild regurgitation.   ------------------------------------------------------------------- Aorta:  Aortic root: The aortic root was normal in  size.  ------------------------------------------------------------------- Mitral valve:   Severely calcified annulus. Mobility was not restricted.  Doppler:  Transvalvular velocity was within the normal range. There was no evidence for stenosis. There was mild regurgitation.  ------------------------------------------------------------------- Left atrium:  The atrium was mildly dilated.  ------------------------------------------------------------------- Right ventricle:  The cavity size was normal. Wall thickness was normal. Systolic function was normal.  ------------------------------------------------------------------- Pulmonic valve:   Poorly visualized.  Structurally normal valve. Cusp separation was normal.  Doppler:  Transvalvular velocity was within the normal range. There was no evidence for stenosis. There was trivial regurgitation.  ------------------------------------------------------------------- Tricuspid valve:   Structurally normal valve.    Doppler: Transvalvular velocity was within the normal range. There was trivial regurgitation.  ------------------------------------------------------------------- Pulmonary artery:   The main pulmonary artery was normal-sized. Systolic pressure was within the normal range.  ------------------------------------------------------------------- Right atrium:  The atrium was normal  in size.  ------------------------------------------------------------------- Pericardium:  There was no pericardial effusion.  ------------------------------------------------------------------- Systemic veins: Inferior vena cava: The vessel was normal in size.  Cath: Conclusion     Prox RCA to Mid RCA lesion is 65% stenosed.  Dist RCA-1 lesion is 85% stenosed. Dist RCA-2 lesion is 95% stenosed with 80% stenosed side branch in Post Atrio.  SVG-rPDA graft was visualized by angiography and is normal in caliber and large. The graft  exhibits no disease.  Ost RPDA lesion is 45% stenosed. Ost RPDA to prox RPDA lesion is 90% stenosed - This limits flow retrograde to the RPAV-RPL.  Ost LAD lesion is 55% stenosed.  Ost 1st Diag (small caliber vessel) lesion is 70% stenosed.  Prox LAD lesion is 100% stenosed.  LIMA-LAD graft is large caliber, widely patent perfusing a small caliber distal LAD  Prox Cx to Mid Cx stent is 25% stenosed.  Dist Cx lesion is 65% stenosed. Ost 3rd Mrg to 3rd Mrg lesion is 70% stenosed. -Both limbs of the distal bifurcation.  There is severe left ventricular systolic dysfunction. The left ventricular ejection fraction is less than 25% by visual estimate.  LV end diastolic pressure is moderately elevated.  Significant aortic and mitral valve calcification.  Calcified pericardium   Dilated cardiomyopathy with severely reduced LVEF of roughly 20 to 25%. Severe multivessel disease with patent LIMA-LAD and SVG-PDA. No obvious culprit lesion to explain significant drop in ejection fraction   Plan: Patient will be transferred to [facility] 4 E. TR band removal per protocol in the PACU holding area continue gentle diuresis given elevated LVEDP Cardiomyopathy management with beta-blocker/ARB, spironolactone as tolerated.    Patient Profile     72 y.o. male with remote CABG presents with acute systolic heart failure  Assessment & Plan    1.  Acute systolic heart failure: The patient is found to have moderate to severe LV systolic dysfunction with some discrepancy between his cardiac cath and echo findings.  By echo his LVEF is 35 to 40% and by cardiac catheterization his LVEF is estimated at 25%.  He is clinically improved with IV diuresis.  Will add carvedilol at low dose.  If this is tolerated we will likely add Entresto during this hospitalization.  Continue to titrate medications as tolerated.  2.  Ischemic cardiomyopathy: Cardiac cath studies reviewed and he has continued  patency of the vein graft to PDA and LIMA to LAD.  He has diffuse heavily calcified native coronaries and medical therapy is recommended for his CAD.  3.  Type 2 diabetes: Cover with sliding scale insulin while here.  He will follow-up with his primary physician as an outpatient.  4.  Hypertension: We will change atenolol to carvedilol in the setting of significant LV dysfunction.  Titrate medicines as tolerated.  Disposition: We will transfer patient to [facility] following cardiac catheterization since his primary problems are related to congestive heart failure.  Anticipate hospital discharge in the next 24 to 48 hours.  Continue IV diuresis today.  Lengthy discussion this afternoon with the patient and his wife regarding natural history of systolic heart failure, outpatient care, lifestyle modification, and medical therapy as well as potential need for advanced therapies.  For questions or updates, please contact [facility] Please consult [email or web address] for contact info under Cardiology/STEMI.      Signed, [name], MD  04/30/2018, 3:04 PM

## 2018-05-01 ENCOUNTER — Encounter (HOSPITAL_COMMUNITY): Payer: Self-pay | Admitting: Cardiology

## 2018-05-01 ENCOUNTER — Inpatient Hospital Stay (HOSPITAL_COMMUNITY): Payer: BLUE CROSS/BLUE SHIELD

## 2018-05-01 DIAGNOSIS — I255 Ischemic cardiomyopathy: Secondary | ICD-10-CM

## 2018-05-01 LAB — BASIC METABOLIC PANEL
Anion gap: 10 (ref 5–15)
BUN: 18 mg/dL (ref 6–20)
CHLORIDE: 98 mmol/L — AB (ref 101–111)
CO2: 32 mmol/L (ref 22–32)
CREATININE: 0.88 mg/dL (ref 0.61–1.24)
Calcium: 8.7 mg/dL — ABNORMAL LOW (ref 8.9–10.3)
GFR calc Af Amer: >60 mL/min (ref >60)
GFR calc non Af Amer: >60 mL/min (ref >60)
GLUCOSE: 187 mg/dL — AB (ref 65–99)
Potassium: 3.7 mmol/L (ref 3.5–5.1)
Sodium: 140 mmol/L (ref 135–145)

## 2018-05-01 LAB — GLUCOSE, CAPILLARY
Glucose-Capillary: 193 mg/dL — ABNORMAL HIGH (ref 65–99)
Glucose-Capillary: 215 mg/dL — ABNORMAL HIGH (ref 65–99)

## 2018-05-01 MED ORDER — FUROSEMIDE 20 MG PO TABS: 20.0000 mg | ORAL_TABLET | Freq: Every day | ORAL | 3 refills | Status: DC

## 2018-05-01 MED ORDER — SACUBITRIL-VALSARTAN 24-26 MG PO TABS: 1.0000 | ORAL_TABLET | Freq: Two times a day (BID) | ORAL | 2 refills | Status: DC

## 2018-05-01 MED ORDER — CARVEDILOL 3.125 MG PO TABS
3.1250 mg | ORAL_TABLET | Freq: Two times a day (BID) | ORAL | Status: DC
  Administered 2018-05-01: 3.125 mg via ORAL
  Filled 2018-05-01: qty 1

## 2018-05-01 MED ORDER — FUROSEMIDE 20 MG PO TABS: 20.0000 mg | ORAL_TABLET | Freq: Every day | ORAL | Status: DC

## 2018-05-01 MED ORDER — CARVEDILOL 3.125 MG PO TABS: 3.1250 mg | ORAL_TABLET | Freq: Two times a day (BID) | ORAL | 3 refills | Status: DC

## 2018-05-01 MED ORDER — SACUBITRIL-VALSARTAN 24-26 MG PO TABS
1.0000 | ORAL_TABLET | Freq: Two times a day (BID) | ORAL | Status: DC
  Administered 2018-05-01: 1 via ORAL
  Filled 2018-05-01: qty 1

## 2018-05-01 MED FILL — Heparin Sod (Porcine)-NaCl IV Soln 1000 Unit/500ML-0.9%: INTRAVENOUS | Qty: 1000 | Status: AC

## 2018-05-01 NOTE — Progress Notes (Signed)
Patient transporting off floor for stress test

## 2018-05-01 NOTE — Discharge Instructions (Signed)
Please hold your Metformin until Saturday 05/03/18. This is because of the contrast dye used during your cardiac catheterization procedure.

## 2018-05-01 NOTE — Progress Notes (Addendum)
Benefit check in progress for [facility] [phone]  4:08 pm - patient discharged home prior to receiving his Entresto coupon card; bedside nurse stated that he did not want it. Abelino Derrick RN,MHA,BSN    RE: Benefit check  Received: Today  Message Contents  Mardene Sayer CMA        # 1. S/W LINDA @ [employer] RX # [id number]   ENTRESTO 24/26 MG BID  COVER-YES  CO-PAY- $ 98.10  TIER- NO  PRIOR APPROVAL- YES # [phone]   NO DEDUCTIBLE   PREFERRED PHARMACY : YES - [facility].

## 2018-05-01 NOTE — Care Management Important Message (Signed)
Important Message  Patient Details  Name: Alec Taylor MRN: [id number] Date of Birth: 1946/06/03   Medicare Important Message Given:  Yes    Myiesha Edgar P Resha Filippone 05/01/2018, 1:53 PM

## 2018-05-01 NOTE — Progress Notes (Signed)
Inpatient Diabetes Program Recommendations  AACE/ADA: New Consensus Statement on Inpatient Glycemic Control (2015)  Target Ranges:  Prepandial:   less than 140 mg/dL      Peak postprandial:   less than 180 mg/dL (1-2 hours)      Critically ill patients:  140 - 180 mg/dL  Results for Alec Taylor, Alec Taylor (MRN [id number]) as of 05/01/2018 11:23  Ref. Range 04/30/2018 07:25 04/30/2018 09:22 04/30/2018 12:00 04/30/2018 21:03 05/01/2018 07:50  Glucose-Capillary Latest Ref Range: 65 - 99 mg/dL 440 (H) 102 (H) 725 (H) 250 (H) 193 (H)    Review of Glycemic Control  Current orders for Inpatient glycemic control: Levemir 4 units BID, Novolog 0-15 units TID with meals, Novolog 0-5 units QHS  Inpatient Diabetes Program Recommendations: Insulin - Basal: Ordered Levemir 4 units BID on 04/30/18. Patient did not receive morning dose of Levemir 4 units yesterday but did receive Levemir 4 units last night at bedtime. Do not recommend any changes with Levemir at this time.  Thanks, Orlando Penner, RN, MSN, CDE Diabetes Coordinator Inpatient Diabetes Program [phone] (Team Pager from 8am to 5pm)

## 2018-05-01 NOTE — Progress Notes (Signed)
[facility] is [facility] d/c papers given and IV and tele removed, picking up meds at the pharmacy.

## 2018-05-01 NOTE — Progress Notes (Signed)
Progress Note  Patient Name: Alec Taylor Date of Encounter: 05/01/2018  Primary Cardiologist: No primary care provider on file.   Subjective   Feels much better. No CP or dyspnea at rest.   Inpatient Medications    Scheduled Meds: . aspirin EC  81 mg Oral QHS  . atorvastatin  20 mg Oral Daily  . buPROPion  150 mg Oral Daily  . busPIRone  15 mg Oral Daily  . clopidogrel  75 mg Oral Daily  . DULoxetine  60 mg Oral QHS  . enoxaparin (LOVENOX) injection  40 mg Subcutaneous Q24H  . finasteride  5 mg Oral QPM  . folic acid  1 mg Oral Daily  . furosemide  40 mg Intravenous BID  . insulin aspart  0-15 Units Subcutaneous TID WC  . insulin aspart  0-5 Units Subcutaneous QHS  . insulin detemir  4 Units Subcutaneous BID  . ofloxacin  1 drop Both Eyes QID  . pantoprazole  40 mg Oral BID  . saccharomyces boulardii  250 mg Oral Daily  . sodium chloride flush  3 mL Intravenous Q12H  . sodium chloride flush  3 mL Intravenous Q12H   Continuous Infusions: . sodium chloride    . sodium chloride     PRN Meds: sodium chloride, sodium chloride, acetaminophen, ammonium lactate, clonazePAM, ondansetron (ZOFRAN) IV, sodium chloride flush, sodium chloride flush, zolpidem   Vital Signs    Vitals:   04/30/18 1745 04/30/18 1954 05/01/18 0511 05/01/18 0800  BP: 120/69 127/64 (!) 124/99 122/69  Pulse: 94 98 91 88  Resp:  16 18 18   Temp: 98.5 F (36.9 C)  98.3 F (36.8 C) 98.3 F (36.8 C)  TempSrc: Oral  Oral Oral  SpO2: 94% 97% 94% 97%  Weight: 178 lb (80.7 kg)  177 lb 11.2 oz (80.6 kg)   Height: 5\' 10"  (1.778 m)       Intake/Output Summary (Last 24 hours) at 05/01/2018 0821 Last data filed at 04/30/2018 2228 Gross per 24 hour  Intake 829.17 ml  Output 1200 ml  Net -370.83 ml   Filed Weights   04/30/18 0540 04/30/18 1745 05/01/18 0511  Weight: 179 lb 3.2 oz (81.3 kg) 178 lb (80.7 kg) 177 lb 11.2 oz (80.6 kg)    Telemetry    Normal sinus rhythm - Personally  Reviewed   Physical Exam  Alert, oriented male in NAD GEN: No acute distress.   Neck: No JVD Cardiac: RRR, no murmurs, rubs, or gallops.  Respiratory: Clear to auscultation bilaterally. GI: Soft, nontender, non-distended  MS: No edema; No deformity. Left radial site clear Neuro:  Nonfocal  Psych: Normal affect   Labs    Chemistry Recent Labs  Lab 04/28/18 1039 04/29/18 0119 04/30/18 1816 05/01/18 0417  NA 135 139  --  140  K 4.7 4.0  --  3.7  CL 100* 98*  --  98*  CO2 25 29  --  32  GLUCOSE 302* 203*  --  187*  BUN 24* 22*  --  18  CREATININE 0.97 0.91 1.03 0.88  CALCIUM 8.7* 9.3  --  8.7*  PROT  --  7.1  --   --   ALBUMIN  --  3.6  --   --   AST  --  34  --   --   ALT  --  36  --   --   ALKPHOS  --  90  --   --   BILITOT  --  0.9  --   --   GFRNONAA >60 >60 >60 >60  GFRAA >60 >60 >60 >60  ANIONGAP 10 12  --  10     Hematology Recent Labs  Lab 04/28/18 1039 04/29/18 0119 04/30/18 1816  WBC 6.5 7.7 6.5  RBC 3.71* 4.01* 3.92*  HGB 10.3* 10.5* 10.2*  HCT 32.3* 34.3* 33.6*  MCV 87.1 85.5 85.7  MCH 27.8 26.2 26.0  MCHC 31.9 30.6 30.4  RDW 14.7 14.7 14.4  PLT 275 314 298    Cardiac Enzymes Recent Labs  Lab 04/28/18 1606 04/28/18 1956 04/29/18 0119 04/29/18 0717  TROPONINI 0.06* 0.05* 0.05* 0.04*   No results for input(s): TROPIPOC in the last 168 hours.   BNP Recent Labs  Lab 04/28/18 1039  BNP 623.2*     DDimer No results for input(s): DDIMER in the last 168 hours.   Radiology    No results found.  Cardiac Studies   Echo: Study Conclusions  - Left ventricle: The cavity size was normal. Wall thickness was   normal. Systolic function was moderately reduced. The estimated   ejection fraction was in the range of 35% to 40%. Diffuse   hypokinesis. The study is not technically sufficient to allow   evaluation of LV diastolic function. - Aortic valve: There was mild regurgitation. - Mitral valve: Severely calcified annulus. There was  mild   regurgitation. - Left atrium: The atrium was mildly dilated. - Tricuspid valve: There was trivial regurgitation.   Patient Profile     72 y.o. male with acute systolic heart failure and moderately severe LV systolic dysfunction with background history of type 2 diabetes and known coronary artery disease status post remote CABG.  Assessment & Plan    1.  Acute systolic heart failure: The patient has moderate to severe LV systolic dysfunction with LVEF 35 to 40% by echo assessment.  He has improved clinically with IV diuresis.  Weight is down 13 pounds from admission.  Oxygen saturations are greater than 95% on room air.  Start carvedilol and Entresto today at low dose.  Mobilize today.  Anticipate discharge this afternoon if he tolerates AM meds. Will arrange FU in the Advanced Heart Failure clinic.   2.  Hypertension: Atenolol discontinued, will start carvedilol. Blood pressure is well controlled on current medicines.  3.  Type 2 diabetes: Patient covered with sliding scale insulin during his hospitalization.  Blood glucoses have ranged from 155-250 over the last 24 hours.    4.  Ischemic cardiomyopathy: Cardiac cath study reviewed and agree with medical therapy for treatment of diffuse distal vessel coronary artery disease with patent bypass grafts.  For questions or updates, please contact [facility] Please consult [email or web address] for contact info under Cardiology/STEMI.      Signed, [name], MD  05/01/2018, 8:21 AM

## 2018-05-01 NOTE — Progress Notes (Signed)
PROGRESS NOTE    Koran Rumler  BHA:[id number] DOB: May 04, 1946 DOA: 04/28/2018 PCP: Patient, No Pcp Per   Brief Narrative:  Prosper Prinkey is an 72 y.o. male past medical history of hypertension status post CABG 2014, diabetes mellitus type 2 last A1c of 6.8, also with Gyasi Hazzard history of gastric bypass comes to the ED complaining of shortness of breath with exertion that has progressively gotten worse for 1 month accompanied by lower extremity edema, in the ED his chest x-ray showed small left pleural effusion with Abena Erdman BNP of 623 cardiac biomarkers at 0.04.  Assessment & Plan:   Principal Problem:   Pleural effusion Active Problems:   Acute systolic CHF (congestive heart failure) (HCC)   Demand ischemia (HCC)   Prolonged QT interval   Diabetes mellitus type 2 in nonobese (HCC)   Anxiety and depression   Coronary artery disease involving native coronary artery of native heart with angina pectoris (HCC)   Acute systolic CHF (congestive heart failure) (HCC): CT angios showed no PE mild pulmonary edema with left-sided effusion. Iasiah Ozment 2D echo done on 04/29/2018 showed Jerusalem Brownstein decrease EF of 35 to 40% compared to his previous echo in January, with diffuse hypokinesia. 5 L negative His weight on admission was 84 kg, which has trended now with diuresis and is now 80.7, which is below his estimated dry weight. Cardiac biomarkers have remained flat. Cardiology following.  Recommending starting carvedilol and entresto today.  Likely d/c this afternoon.    Elevated cardiac biomarkers/coronary artery disease with history of CABG: With his chest tightness, PVCs on EKG no signs of ischemia no events on telemetry. Pt with cath with dilated cardiomyopathy with severely reduced LVEF of 20-25%, severe multivessel disease with patent LIMA-LAD and SVG-PDA.  No obvious culprit lesion to explain significant drop in EF Continue aspirin, statins, atenolol.  Mild hypoxia:  resolved Saturation is low as 86% now satting well on  room air  Prolonged QTc: On admission 502 Repeated twelve-lead EKG today    Left-sided pleural effusion Repeat x-ray tomorrow to see if his pleural effusion has improved. Improved effusion on CXR today  Diabetes mellitus type 2: With an A1c of 6.8 continue to hold oral hypoglycemic agents. Continue CBGs before meals and at bedtime. Increase long-acting insulin continue sliding scale.  Anxiety and depression: Continue Cymbalta, BuSpar and Wellbutrin.  Alopecia: Continue methotrexate, hold Plaquenil to prolonged QTc (repeat EKG today).  DVT prophylaxis: lovenox Code Status: full Family Communication: none at bedside Disposition Plan: likely this afternoon   Consultants:   Cardiology primary  Procedures:  Cath 5/22  Prox RCA to Mid RCA lesion is 65% stenosed.  Dist RCA-1 lesion is 85% stenosed. Dist RCA-2 lesion is 95% stenosed with 80% stenosed side branch in Post Atrio.  SVG-rPDA graft was visualized by angiography and is normal in caliber and large. The graft exhibits no disease.  Ost RPDA lesion is 45% stenosed. Ost RPDA to prox RPDA lesion is 90% stenosed - This limits flow retrograde to the RPAV-RPL.  Ost LAD lesion is 55% stenosed.  Ost 1st Diag (small caliber vessel) lesion is 70% stenosed.  Prox LAD lesion is 100% stenosed.  LIMA-LAD graft is large caliber, widely patent perfusing Maurisio Ruddy small caliber distal LAD  Prox Cx to Mid Cx stent is 25% stenosed.  Dist Cx lesion is 65% stenosed. Ost 3rd Mrg to 3rd Mrg lesion is 70% stenosed. -Both limbs of the distal bifurcation.  There is severe left ventricular systolic dysfunction. The left ventricular ejection fraction  is less than 25% by visual estimate.  LV end diastolic pressure is moderately elevated.  Significant aortic and mitral valve calcification.  Calcified pericardium   Dilated cardiomyopathy with severely reduced LVEF of roughly 20 to 25%. Severe multivessel disease with patent LIMA-LAD and  SVG-PDA. No obvious culprit lesion to explain significant drop in ejection fraction   Plan: Patient will be transferred to [facility] 4 E. TR band removal per protocol in the PACU holding area continue gentle diuresis given elevated LVEDP Cardiomyopathy management with beta-blocker/ARB, spironolactone as tolerated.  5/21 Study Conclusions  - Left ventricle: The cavity size was normal. Wall thickness was   normal. Systolic function was moderately reduced. The estimated   ejection fraction was in the range of 35% to 40%. Diffuse   hypokinesis. The study is not technically sufficient to allow   evaluation of LV diastolic function. - Aortic valve: There was mild regurgitation. - Mitral valve: Severely calcified annulus. There was mild   regurgitation. - Left atrium: The atrium was mildly dilated. - Tricuspid valve: There was trivial regurgitation.  Antimicrobials:  Anti-infectives (From admission, onward)   Start     Dose/Rate Route Frequency Ordered Stop   04/28/18 2200  hydroxychloroquine (PLAQUENIL) tablet 200 mg  Status:  Discontinued     200 mg Oral 2 times daily 04/28/18 1942 04/29/18 0557      Subjective: SOB improved.    Objective: Vitals:   04/30/18 1745 04/30/18 1954 05/01/18 0511 05/01/18 0800  BP: 120/69 127/64 (!) 124/99 122/69  Pulse: 94 98 91 88  Resp:  16 18 18   Temp: 98.5 F (36.9 C)  98.3 F (36.8 C) 98.3 F (36.8 C)  TempSrc: Oral  Oral Oral  SpO2: 94% 97% 94% 97%  Weight: 80.7 kg (178 lb)  80.6 kg (177 lb 11.2 oz)   Height: 5\' 10"  (1.778 m)       Intake/Output Summary (Last 24 hours) at 05/01/2018 0942 Last data filed at 05/01/2018 05/03/2018 Gross per 24 hour  Intake 1069.17 ml  Output 1600 ml  Net -530.83 ml   Filed Weights   04/30/18 0540 04/30/18 1745 05/01/18 0511  Weight: 81.3 kg (179 lb 3.2 oz) 80.7 kg (178 lb) 80.6 kg (177 lb 11.2 oz)    Examination:  General exam: Appears calm and comfortable  Respiratory system: Clear to  auscultation. Respiratory effort normal. Cardiovascular system: S1 & S2 heard, RRR. No JVD, murmurs, rubs, gallops or clicks. No pedal edema. Gastrointestinal system: Abdomen is nondistended, soft and nontender. No organomegaly or masses felt. Normal bowel sounds heard. Central nervous system: Alert and oriented. No focal neurological deficits. Extremities: Symmetric 5 x 5 power. Skin: No rashes, lesions or ulcers Psychiatry: Judgement and insight appear normal. Mood & affect appropriate.     Data Reviewed: I have personally reviewed following labs and imaging studies  CBC: Recent Labs  Lab 04/28/18 1039 04/29/18 0119 04/30/18 1816  WBC 6.5 7.7 6.5  NEUTROABS 4.9 5.5  --   HGB 10.3* 10.5* 10.2*  HCT 32.3* 34.3* 33.6*  MCV 87.1 85.5 85.7  PLT 275 314 298   Basic Metabolic Panel: Recent Labs  Lab 04/28/18 1039 04/28/18 1956 04/29/18 0119 04/30/18 1816 05/01/18 0417  NA 135  --  139  --  140  K 4.7  --  4.0  --  3.7  CL 100*  --  98*  --  98*  CO2 25  --  29  --  32  GLUCOSE 302*  --  203*  --  187*  BUN 24*  --  22*  --  18  CREATININE 0.97  --  0.91 1.03 0.88  CALCIUM 8.7*  --  9.3  --  8.7*  MG  --  1.9  --   --   --    GFR: Estimated Creatinine Clearance: 80.7 mL/min (by C-G formula based on SCr of 0.88 mg/dL). Liver Function Tests: Recent Labs  Lab 04/29/18 0119  AST 34  ALT 36  ALKPHOS 90  BILITOT 0.9  PROT 7.1  ALBUMIN 3.6   No results for input(s): LIPASE, AMYLASE in the last 168 hours. No results for input(s): AMMONIA in the last 168 hours. Coagulation Profile: No results for input(s): INR, PROTIME in the last 168 hours. Cardiac Enzymes: Recent Labs  Lab 04/28/18 1039 04/28/18 1606 04/28/18 1956 04/29/18 0119 04/29/18 0717  TROPONINI 0.04* 0.06* 0.05* 0.05* 0.04*   BNP (last 3 results) No results for input(s): PROBNP in the last 8760 hours. HbA1C: Recent Labs    04/29/18 0119  HGBA1C 6.8*   CBG: Recent Labs  Lab 04/30/18 0725  04/30/18 0922 04/30/18 1200 04/30/18 2103 05/01/18 0750  GLUCAP 188* 177* 155* 250* 193*   Lipid Profile: No results for input(s): CHOL, HDL, LDLCALC, TRIG, CHOLHDL, LDLDIRECT in the last 72 hours. Thyroid Function Tests: Recent Labs    04/28/18 1956  TSH 3.571   Anemia Panel: No results for input(s): VITAMINB12, FOLATE, FERRITIN, TIBC, IRON, RETICCTPCT in the last 72 hours. Sepsis Labs: No results for input(s): PROCALCITON, LATICACIDVEN in the last 168 hours.  No results found for this or any previous visit (from the past 240 hour(s)).       Radiology Studies: Dg Chest 1 View  Result Date: 05/01/2018 CLINICAL DATA:  Left-sided pleural effusion EXAM: CHEST  1 VIEW COMPARISON:  04/28/2018 FINDINGS: Cardiac shadow is stable. Postoperative changes are again seen. Improved aeration is noted in the bases with decreased effusion bilaterally. No central vascular congestion or focal infiltrate is seen. IMPRESSION: Improved aeration bilaterally. Electronically Signed   By: Alcide Clever M.D.   On: 05/01/2018 09:37        Scheduled Meds: . aspirin EC  81 mg Oral QHS  . atorvastatin  20 mg Oral Daily  . buPROPion  150 mg Oral Daily  . busPIRone  15 mg Oral Daily  . carvedilol  3.125 mg Oral BID WC  . DULoxetine  60 mg Oral QHS  . enoxaparin (LOVENOX) injection  40 mg Subcutaneous Q24H  . finasteride  5 mg Oral QPM  . folic acid  1 mg Oral Daily  . [START ON 05/02/2018] furosemide  20 mg Oral Daily  . insulin aspart  0-15 Units Subcutaneous TID WC  . insulin aspart  0-5 Units Subcutaneous QHS  . insulin detemir  4 Units Subcutaneous BID  . ofloxacin  1 drop Both Eyes QID  . pantoprazole  40 mg Oral BID  . saccharomyces boulardii  250 mg Oral Daily  . sacubitril-valsartan  1 tablet Oral BID  . sodium chloride flush  3 mL Intravenous Q12H  . sodium chloride flush  3 mL Intravenous Q12H   Continuous Infusions: . sodium chloride    . sodium chloride       LOS: 3 days     Time spent: over 30    Lacretia Nicks, MD [facility] Pager [phone]   If 7PM-7AM, please contact night-coverage [email or web address] Password [facility] 05/01/2018, 9:42 AM

## 2018-05-01 NOTE — Discharge Summary (Addendum)
Discharge Summary    Patient ID: Alec Taylor,  MRN: [id number], DOB/AGE: Apr 14, 1946 72 y.o.  Admit date: 04/28/2018 Discharge date: 05/01/2018  Primary Care Provider: Patient, No Pcp Per Primary Cardiologist: [name], MD  Discharge Diagnoses    Principal Problem:   Acute systolic CHF (congestive heart failure) (HCC) Active Problems:   Pleural effusion   Demand ischemia (HCC)   Prolonged QT interval   Diabetes mellitus type 2 in nonobese (HCC)   Anxiety and depression   Coronary artery disease involving native coronary artery of native heart with angina pectoris (HCC)   Ischemic cardiomyopathy  Allergies Allergies  Allergen Reactions  . Altace [Ramipril]     Cough    Diagnostic Studies/Procedures    Cardiac catheterization 04/30/2018:   Prox RCA to Mid RCA lesion is 65% stenosed.  Dist RCA-1 lesion is 85% stenosed. Dist RCA-2 lesion is 95% stenosed with 80% stenosed side branch in Post Atrio.  SVG-rPDA graft was visualized by angiography and is normal in caliber and large. The graft exhibits no disease.  Ost RPDA lesion is 45% stenosed. Ost RPDA to prox RPDA lesion is 90% stenosed - This limits flow retrograde to the RPAV-RPL.  Ost LAD lesion is 55% stenosed.  Ost 1st Diag (small caliber vessel) lesion is 70% stenosed.  Prox LAD lesion is 100% stenosed.  LIMA-LAD graft is large caliber, widely patent perfusing a small caliber distal LAD  Prox Cx to Mid Cx stent is 25% stenosed.  Dist Cx lesion is 65% stenosed. Ost 3rd Mrg to 3rd Mrg lesion is 70% stenosed. -Both limbs of the distal bifurcation.  There is severe left ventricular systolic dysfunction. The left ventricular ejection fraction is less than 25% by visual estimate.  LV end diastolic pressure is moderately elevated.  Significant aortic and mitral valve calcification.  Calcified pericardium   Dilated cardiomyopathy with severely reduced LVEF of roughly 20 to 25%. Severe multivessel  disease with patent LIMA-LAD and SVG-PDA. No obvious culprit lesion to explain significant drop in ejection fraction   Plan: Patient will be transferred to [facility] 4 E. TR band removal per protocol in the PACU holding area continue gentle diuresis given elevated LVEDP Cardiomyopathy management with beta-blocker/ARB, spironolactone as tolerated. _____________   Echocardiogram 04/29/2018:  Study Conclusions  - Left ventricle: The cavity size was normal. Wall thickness was   normal. Systolic function was moderately reduced. The estimated   ejection fraction was in the range of 35% to 40%. Diffuse   hypokinesis. The study is not technically sufficient to allow   evaluation of LV diastolic function. - Aortic valve: There was mild regurgitation. - Mitral valve: Severely calcified annulus. There was mild   regurgitation. - Left atrium: The atrium was mildly dilated. - Tricuspid valve: There was trivial regurgitation.  History of Present Illness    Alec Taylor is a 72 y.o. male with a hx of CAD s/p CABG (approximately 2004 at [facility]), HTN, HLD, DM 2 and gastric bypass who was seen by Cardiology for the evaluation of new onset shortness of breath and exertional chest tightness.  Alec Taylor is a 72 year old male with a history stated above who presented to [facility] on 04/28/2018 with complaints of worsening shortness of breath over the last month.  He reported being recently admitted to the hospital in 12/2017 with pneumonia.  At baseline, patient is able to play golf without issues however, has recently noticed that he gets short of breath even with walking to the  car. He also had complaints of right sided, nonradiating chest tightness rated a 4/10 in severity.  He stated the chest tightness is usually associated with exertion and shortness of breath and is usually relieved with rest.  He denied associated nausea, vomiting, diaphoresis, palpitations or syncope.  He also reported mild LE  swelling. He stated that he does not smoke cigarettes and uses occasional alcohol. 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  Patient reported no anginal symptoms since his CABG surgery approximately 15 years ago.    Of note, he saw his PCP on 04/18/2018 for similar SOB complaints. Patient was started on albuterol, PFTs were ordered and patient was to get chest PA and lateral for pneumonia follow-up scheduled for this coming Thursday.  Last echocardiogram performed 12/2017 during his last hospital admission revealed LVEF of 45 to 50%.  Baseline weight appears to be 188-190lbs for the last 21-month time period.  He has had no prior history of CHF.  He is followed by Dr. Beverely Pace with [facility] in [facility] and was last seen 03/27/2017 per chart review.  At that time he reported improving his exercise tolerance by increasing his activity level and going more routinely to the gym for exercise sessions.  He had no reports of anginal symptoms and was otherwise doing well.During our interview,  he reported several stress tests over the years per Dr. Beverely Pace however, I do not see these results in care everywhere.  These tests were routinely performed, not not due to anginal symptoms.   Hospital Course   In the ED, his BNP was noted to be mildly elevated at 623 and troponin level of 0.04, 0.04.  EKG with sinus tachycardia with PVCs and no acute ischemic ST-T wave abnormalities.  Chest x-ray revealed new small left pleural effusion.  Given his shortness of breath and tachycardia on presentation, a chest CT angiogram was performed which showed no signs of PE however, moderate bilateral pleural effusions. Patient was given IV Lasix 20 mg with good results.  Given the patient's presenting symptoms, an echocardiogram was completed on 04/29/2017 which revealed an LVEF of 35 to 40% with diffuse hypokinesis. Given this change from prior echocardiogram, he was subsequently scheduled for  cardiac catheterization on 04/30/2018 which showed dilated cardiomyopathy with severely reduced LVEF of roughly 20 to 25% by visual estimate, severe multivessel disease with patent LIMA-LAD and SVG-PDA.  There were no obvious culprit lesions to explain significant drop in ejection fraction.  Recommendations were made for continued gentle diuresis given elevated LVEDP along with cardiomyopathy management with beta-blocker/ARB, spironolactone as tolerated.  Other issues during hospitalization include:  -DM 2: Uncontrolled, last hemoglobin A1c noted to be 6.8.  He is on home glimepiride and metformin.  Metformin to be restarted 05/03/2018 secondary to IV contrast dye, pt informed.  He will need to follow with his PCP for further DM2 management  -HTN: Stable, 122/76, 122/69, 124/99    Discharge medications: ASA 81, atorvastatin 20, carvedilol 3.125, Lasix 20 mg daily, Entresto 24-26 mg twice daily  Consultants: None  Discharge weight, 177lb  The patient was seen and examined by Dr. Excell Seltzer who feels that he is stable and ready for discharge on 05/01/2018.  The patient tolerated medications.  Left radial site unremarkable without signs of hematoma.  Patient has ambulated without complication prior to discharge.  Discharge Vitals Blood pressure 103/62, pulse 79, temperature 97.7 F (36.5 C), temperature source Oral, resp. rate 18, height 5\' 10"  (1.778  m), weight 177 lb 11.2 oz (80.6 kg), SpO2 100 %.  Filed Weights   04/30/18 0540 04/30/18 1745 05/01/18 0511  Weight: 179 lb 3.2 oz (81.3 kg) 178 lb (80.7 kg) 177 lb 11.2 oz (80.6 kg)   Labs & Radiologic Studies    CBC Recent Labs    04/29/18 0119 04/30/18 1816  WBC 7.7 6.5  NEUTROABS 5.5  --   HGB 10.5* 10.2*  HCT 34.3* 33.6*  MCV 85.5 85.7  PLT 314 298   Basic Metabolic Panel Recent Labs    16/60/60 1956  04/29/18 0119 04/30/18 1816 05/01/18 0417  NA  --   --  139  --  140  K  --   --  4.0  --  3.7  CL  --   --  98*  --  98*  CO2   --   --  29  --  32  GLUCOSE  --   --  203*  --  187*  BUN  --   --  22*  --  18  CREATININE  --    < > 0.91 1.03 0.88  CALCIUM  --   --  9.3  --  8.7*  MG 1.9  --   --   --   --    < > = values in this interval not displayed.   Liver Function Tests Recent Labs    04/29/18 0119  AST 34  ALT 36  ALKPHOS 90  BILITOT 0.9  PROT 7.1  ALBUMIN 3.6   Cardiac Enzymes Recent Labs    04/28/18 1956 04/29/18 0119 04/29/18 0717  TROPONINI 0.05* 0.05* 0.04*   Hemoglobin A1C Recent Labs    04/29/18 0119  HGBA1C 6.8*   Thyroid Function Tests Recent Labs    04/28/18 1956  TSH 3.571  ____________  Dg Chest 1 View  Result Date: 05/01/2018 CLINICAL DATA:  Left-sided pleural effusion EXAM: CHEST  1 VIEW COMPARISON:  04/28/2018 FINDINGS: Cardiac shadow is stable. Postoperative changes are again seen. Improved aeration is noted in the bases with decreased effusion bilaterally. No central vascular congestion or focal infiltrate is seen. IMPRESSION: Improved aeration bilaterally. Electronically Signed   By: Alcide Clever M.D.   On: 05/01/2018 09:37   Dg Chest 2 View  Result Date: 04/28/2018 CLINICAL DATA:  Chest pain and shortness of breath for several months EXAM: CHEST - 2 VIEW COMPARISON:  04/18/2018 FINDINGS: Cardiac shadow is stable. Postsurgical changes are again seen. Lungs are well aerated bilaterally. Small left pleural effusion is noted new from the prior study. Stable vascular congestion is noted without interstitial edema. No bony abnormality is seen. IMPRESSION: New small left pleural effusion. Stable vascular congestion without edema. Electronically Signed   By: Alcide Clever M.D.   On: 04/28/2018 11:39   Ct Angio Chest Pe W/cm &/or Wo Cm  Result Date: 04/28/2018 CLINICAL DATA:  Chest pain, shortness of breath. EXAM: CT ANGIOGRAPHY CHEST WITH CONTRAST TECHNIQUE: Multidetector CT imaging of the chest was performed using the standard protocol during bolus administration of  intravenous contrast. Multiplanar CT image reconstructions and MIPs were obtained to evaluate the vascular anatomy. CONTRAST:  ISOVUE-370 IOPAMIDOL (ISOVUE-370) INJECTION 76% COMPARISON:  CT scan of December 16, 2017. FINDINGS: Cardiovascular: Satisfactory opacification of the pulmonary arteries to the segmental level. No evidence of pulmonary embolism. Normal heart size. No pericardial effusion. Mediastinum/Nodes: Stable calcified left thyroid nodule. The esophagus is unremarkable. Stable mediastinal adenopathy is noted which most likely is reactive  in etiology. Lungs/Pleura: Moderate bilateral pleural effusions are noted. No pneumothorax is noted. Mosaic airspace opacity is noted which may represent focal air trapping or possible multifocal inflammation. Upper Abdomen: Probable hepatic cirrhosis. Status post gastric bypass. Musculoskeletal: No chest wall abnormality. No acute or significant osseous findings. Review of the MIP images confirms the above findings. IMPRESSION: No definite evidence of pulmonary embolus. Moderate bilateral pleural effusions. Stable mediastinal adenopathy is noted which most likely is reactive in etiology. Mosaic airspace opacity is noted throughout both lungs which may represent focal air trapping or possibly multifocal inflammation. Probable hepatic cirrhosis. Electronically Signed   By: Lupita Raider, M.D.   On: 04/28/2018 13:46   Disposition   Pt is being discharged home today in good condition.  Follow-up Plans & Appointments   Follow-up Information    [facility] Follow up.   Specialty:  Cardiology Why:  Your appointment date and time will be scheduled by the heart failure clinic.  They will call you with this information as soon as possible. Contact information: [address] [id number] mc [place] [address] [phone]         Discharge Instructions    (HEART FAILURE PATIENTS) Call MD:   Anytime you have any of the following symptoms: 1) 3 pound weight gain in 24 hours or 5 pounds in 1 week 2) shortness of breath, with or without a dry hacking cough 3) swelling in the hands, feet or stomach 4) if you have to sleep on extra pillows at night in order to breathe.   Complete by:  As directed    Call MD for:  persistant dizziness or light-headedness   Complete by:  As directed    Call MD for:  redness, tenderness, or signs of infection (pain, swelling, redness, odor or green/yellow discharge around incision site)   Complete by:  As directed    Call MD for:  severe uncontrolled pain   Complete by:  As directed    Call MD for:  temperature >100.4   Complete by:  As directed    Diet - low sodium heart healthy   Complete by:  As directed    Discharge instructions   Complete by:  As directed    One of your heart tests showed weakness of the heart muscle this admission. This may make you more susceptible to weight gain from fluid retention, which can lead to symptoms that we call heart failure. Please follow these special instructions:  1. Follow a low-salt diet - you are allowed no more than 2,000mg  of sodium per day. Watch your fluid intake. In general, you should not be taking in more than 2 liters of fluid per day (no more than 8 glasses per day). This includes sources of water in foods like soup, coffee, tea, milk, etc. 2. Weigh yourself on the same scale at same time of day and keep a log. 3. Call your doctor: (Anytime you feel any of the following symptoms)  - 3lb weight gain overnight or 5lb within a few days - Shortness of breath, with or without a dry hacking cough  - Swelling in the hands, feet or stomach  - If you have to sleep on extra pillows at night in order to breathe   IT IS IMPORTANT TO LET YOUR DOCTOR KNOW EARLY ON IF YOU ARE HAVING SYMPTOMS SO WE CAN HELP YOU!  No driving for 3 days. No lifting over 5 lbs for 1  week. No sexual activity for 1 week. Keep procedure  site clean & dry. If you notice increased pain, swelling, bleeding or pus, call/return!  You may shower, but no soaking baths/hot tubs/pools for 1 week.   Increase activity slowly   Complete by:  As directed      Discharge Medications   Allergies as of 05/01/2018      Reactions   Altace [ramipril]    Cough      Medication List    STOP taking these medications   atenolol 25 MG tablet Commonly known as:  TENORMIN   naproxen sodium 220 MG tablet Commonly known as:  ALEVE     TAKE these medications   albuterol 108 (90 Base) MCG/ACT inhaler Commonly known as:  PROVENTIL HFA;VENTOLIN HFA Inhale into the lungs every 6 (six) hours as needed for wheezing or shortness of breath.   ammonium lactate 12 % lotion Commonly known as:  LAC-HYDRIN Apply topically 2 (two) times daily as needed. To feet   aspirin EC 81 MG tablet Take 81 mg by mouth at bedtime.   atorvastatin 20 MG tablet Commonly known as:  LIPITOR Take 20 mg by mouth daily.   bimatoprost 0.03 % ophthalmic solution Commonly known as:  LATISSE Place one drop on applicator and apply evenly along the skin of the upper eyelid at base of eyelashes once daily at bedtime; repeat procedure for second eye (use a clean applicator). Can also apply to the eyebrows   buPROPion 150 MG 24 hr tablet Commonly known as:  WELLBUTRIN XL Take 150 mg by mouth every morning.   busPIRone 15 MG tablet Commonly known as:  BUSPAR Take 15 mg by mouth every morning.   carvedilol 3.125 MG tablet Commonly known as:  COREG Take 1 tablet (3.125 mg total) by mouth 2 (two) times daily with a meal.   clonazePAM 0.5 MG tablet Commonly known as:  KLONOPIN Take 0.5 mg by mouth at bedtime as needed.   [employer] DIGESTIVE PROBIOTIC 250 MG capsule Generic drug:  saccharomyces boulardii Take 250 mg by mouth every morning.   donepezil 5 MG tablet Commonly known as:  ARICEPT Take 5 mg by mouth daily.   DULoxetine 60 MG capsule Commonly known as:   CYMBALTA Take 60 mg by mouth at bedtime.   finasteride 5 MG tablet Commonly known as:  PROSCAR Take 5 mg by mouth every evening.   fluocinonide 0.05 % external solution Commonly known as:  LIDEX Apply topically.   folic acid 1 MG tablet Commonly known as:  FOLVITE Take 1 mg by mouth daily.   furosemide 20 MG tablet Commonly known as:  LASIX Take 1 tablet (20 mg total) by mouth daily. Start taking on:  05/02/2018   glimepiride 2 MG tablet Commonly known as:  AMARYL Take 2 mg by mouth every evening.   hydroxychloroquine 200 MG tablet Commonly known as:  PLAQUENIL Take 200 mg by mouth 2 (two) times daily.   metFORMIN 500 MG 24 hr tablet Commonly known as:  GLUCOPHAGE-XR Take 500 mg by mouth 2 (two) times daily.   methotrexate 2.5 MG tablet Commonly known as:  RHEUMATREX Take 5 mg by mouth once a week. On Wednesday   MULTIVITAMIN ADULT PO Take 1 tablet by mouth every morning.   ofloxacin 0.3 % ophthalmic solution Commonly known as:  OCUFLOX Place 1 drop into both eyes 4 (four) times daily. Prior to procedures.   pantoprazole 40 MG tablet Commonly known as:  PROTONIX Take 40  mg by mouth 2 (two) times daily.   sacubitril-valsartan 24-26 MG Commonly known as:  ENTRESTO Take 1 tablet by mouth 2 (two) times daily.   Vitamin D3 5000 units Caps Take 5,000 Units by mouth 2 (two) times daily.   zolpidem 12.5 MG CR tablet Commonly known as:  AMBIEN CR Take 12.5 mg by mouth at bedtime as needed.        Outstanding Labs/Studies   Patient will need to be seen for follow-up in the heart failure clinic. Message sent for office to call pt with date and time of appointment.    Will need to follow with PCP for further management of HbA1c, DM2  Entresto to be titrated as tolerated at that time.  Will need BMET at this time.  Duration of Discharge Encounter   Greater than 30 minutes including physician time.  Signed, Daron Offer, NP 05/01/2018, 2:35  PM

## 2018-05-06 DIAGNOSIS — I10 Essential (primary) hypertension: Secondary | ICD-10-CM | POA: Diagnosis not present

## 2018-05-06 DIAGNOSIS — I2510 Atherosclerotic heart disease of native coronary artery without angina pectoris: Secondary | ICD-10-CM | POA: Diagnosis not present

## 2018-05-06 DIAGNOSIS — I739 Peripheral vascular disease, unspecified: Secondary | ICD-10-CM | POA: Diagnosis not present

## 2018-05-06 DIAGNOSIS — E119 Type 2 diabetes mellitus without complications: Secondary | ICD-10-CM | POA: Diagnosis not present

## 2018-05-07 ENCOUNTER — Encounter: Payer: Self-pay | Admitting: Cardiology

## 2018-05-07 ENCOUNTER — Telehealth: Payer: Self-pay

## 2018-05-07 ENCOUNTER — Ambulatory Visit (INDEPENDENT_AMBULATORY_CARE_PROVIDER_SITE_OTHER): Payer: BLUE CROSS/BLUE SHIELD | Admitting: Cardiology

## 2018-05-07 VITALS — BP 100/52 | HR 94 | Ht 70.0 in | Wt 183.6 lb

## 2018-05-07 DIAGNOSIS — I5021 Acute systolic (congestive) heart failure: Secondary | ICD-10-CM

## 2018-05-07 DIAGNOSIS — I10 Essential (primary) hypertension: Secondary | ICD-10-CM

## 2018-05-07 DIAGNOSIS — I2510 Atherosclerotic heart disease of native coronary artery without angina pectoris: Secondary | ICD-10-CM

## 2018-05-07 DIAGNOSIS — I420 Dilated cardiomyopathy: Secondary | ICD-10-CM | POA: Diagnosis not present

## 2018-05-07 DIAGNOSIS — E785 Hyperlipidemia, unspecified: Secondary | ICD-10-CM

## 2018-05-07 MED ORDER — FUROSEMIDE 20 MG PO TABS: 20.0000 mg | ORAL_TABLET | ORAL | 3 refills | Status: DC

## 2018-05-07 NOTE — Progress Notes (Signed)
Cardiology Office Note:    Date:  05/08/2018   ID:  Alec Taylor, DOB [birth year], MRN [id number]  PCP:  Malka So., MD  Cardiologist:  [name], MD  Referring MD: No ref. provider found   Chief Complaint  Patient presents with  . Hospitalization Follow-up    CHF    History of Present Illness:    Alec Taylor is a 72 y.o. male with a past medical history significant for CAD S/P CABG (approximately 2004 in [facility]), hypertension, hyperlipidemia, diabetes type 2 and gastric bypass. Alec Taylor has been followed by Dr. Beverely Pace in the past for cardiology.  He was hospitalized for pneumonia in 12/2017.  Echocardiogram at that time showed LVEF of 45-50%.  He recently developed new onset of shortness of breath and exertional chest tightness.  He also had lower extremity edema.  He was hospitalized 5/20-5/23/2019 for evaluation of shortness of breath and exertional chest tightness.  BNP was elevated at 623, troponins were very mildly elevated at 0.04.  EKG showed sinus tachycardia with PVCs but no ischemic ST/T wave abnormalities.  There was small left pleural CT angiogram showed no signs of PE, but there were moderate bilateral pleural effusions.  The patient was given Lasix with good results.  An echocardiogram on 04/29/2017 revealed a reduced LVEF of 35-40% with diffuse hypokinesis.  Given this change in LV function the patient was taken for cardiac catheterization on 04/30/2018 which showed dilated cardiomyopathy with severely reduced LVEF of roughly 20-25%, severe multivessel disease with patent LIMA-LAD and SVG-PDA.  There were no obvious culprit lesions to explain significant drop in ejection fraction.  Recommendations were made continued gentle diuresis along with cardiomyopathy management with beta-blocker, ARB, Spironolactone as tolerated.  His atenolol was switched to carvedilol.  He was started on Entresto 24-26 mg bid.  He was discharged on Lasix 20 mg daily.  Not yet started on  spironolactone.  The patient is here today for follow-up with his wife. He reports breathing much better, nearly back to normal. No chest tightness. He does have mild DOE with more activity and he has not tried to increase his activity yet since his discharge. He gets occ lightheadedness upon standing, yesterday it lasted for about a minute. Not having it very often or very significantly. Home BP's 108-123/50's-70's. He has no edema. He doesn't know if he has orthopnea because he sleeps in a recliner since when he was very heavy, over 300 lbs, even after wt loss.   Discharge wt 177. Today 183. Home wts have been stable within 2 pounds.   He has history of sleep apnea when he was over 300 lbs, but now doesn't have the apnea. He never used a CPAP.   Past Medical History:  Diagnosis Date  . CAD (coronary artery disease) 09/2007   s/p CABG approximately 2004-[facility] (Cath - 17 Sep 2007:90% prox LAD with diffuse 85% narrowing to distal segment, 80% prox CXA, RCA: prox 90%, prox-mid 70%, mid 85%, distal 80%, D1 80%; Normal EF 65% --status post CABG--LIMA to LAD, SVG to PDA.    [name] COPD (chronic obstructive pulmonary disease) (HCC)   . Depression   . Diabetes mellitus without complication (HCC)   . Hypertension   . Ischemic cardiomyopathy   . Pleural effusion   . S/P CABG x 2    CABG--LIMA to LAD, SVG to PDA.      Past Surgical History:  Procedure Laterality Date  . APPENDECTOMY    . CHOLECYSTECTOMY    .  CORONARY ARTERY BYPASS GRAFT    . GASTRIC BYPASS    . LEFT HEART CATH AND CORS/GRAFTS ANGIOGRAPHY N/A 04/30/2018   Procedure: LEFT HEART CATH AND CORS/GRAFTS ANGIOGRAPHY;  Surgeon: Marykay Lex, MD;  Location: [facility] INVASIVE CV LAB;  Service: Cardiovascular;  Laterality: N/A;    Current Medications: Current Meds  Medication Sig  . albuterol (PROVENTIL HFA;VENTOLIN HFA) 108 (90 Base) MCG/ACT inhaler Inhale into the lungs every 6 (six) hours as needed for wheezing or shortness of breath.  [name]  ammonium lactate (LAC-HYDRIN) 12 % lotion Apply topically 2 (two) times daily as needed. To feet  . atorvastatin (LIPITOR) 20 MG tablet Take 20 mg by mouth daily.  [name] buPROPion (WELLBUTRIN XL) 150 MG 24 hr tablet Take 150 mg by mouth every morning.  . busPIRone (BUSPAR) 15 MG tablet Take 15 mg by mouth every morning.  . carvedilol (COREG) 3.125 MG tablet Take 1 tablet (3.125 mg total) by mouth 2 (two) times daily with a meal.  . Cholecalciferol (VITAMIN D3) 5000 units CAPS Take 5,000 Units by mouth 2 (two) times daily.  . clonazePAM (KLONOPIN) 0.5 MG tablet Take 0.5 mg by mouth at bedtime as needed.  . DULoxetine (CYMBALTA) 60 MG capsule Take 60 mg by mouth at bedtime.  . finasteride (PROSCAR) 5 MG tablet Take 5 mg by mouth every evening.   . folic acid (FOLVITE) 1 MG tablet Take 1 mg by mouth daily.  [name] glimepiride (AMARYL) 2 MG tablet Take 2 mg by mouth every evening.  . metFORMIN (GLUCOPHAGE-XR) 500 MG 24 hr tablet Take 500 mg by mouth 2 (two) times daily.  . methotrexate (RHEUMATREX) 2.5 MG tablet Take 12.5 mg by mouth once a week. On Wednesday  . Multiple Vitamins-Minerals (MULTIVITAMIN ADULT PO) Take 1 tablet by mouth every morning.  [name] ofloxacin (OCUFLOX) 0.3 % ophthalmic solution Place 1 drop into both eyes 4 (four) times daily. Prior to procedures.  . pantoprazole (PROTONIX) 40 MG tablet Take 40 mg by mouth 2 (two) times daily.  [name] saccharomyces boulardii ([employer] DIGESTIVE PROBIOTIC) 250 MG capsule Take 250 mg by mouth every morning.  . sacubitril-valsartan (ENTRESTO) 24-26 MG Take 1 tablet by mouth 2 (two) times daily.  [name] zolpidem (AMBIEN CR) 12.5 MG CR tablet Take 12.5 mg by mouth at bedtime as needed.  . [DISCONTINUED] furosemide (LASIX) 20 MG tablet Take 1 tablet (20 mg total) by mouth daily.     Allergies:   Altace [ramipril]   Social History   Socioeconomic History  . Marital status: Legally Separated    Spouse name: Not on file  . Number of children: Not on file  . Years of  education: Not on file  . Highest education level: Not on file  Occupational History  . Not on file  Social Needs  . Financial resource strain: Not on file  . Food insecurity:    Worry: Not on file    Inability: Not on file  . Transportation needs:    Medical: Not on file    Non-medical: Not on file  Tobacco Use  . Smoking status: Never Smoker  . Smokeless tobacco: Never Used  Substance and Sexual Activity  . Alcohol use: Yes    Comment: occ  . Drug use: Never  . Sexual activity: Not on file  Lifestyle  . Physical activity:    Days per week: Not on file    Minutes per session: Not on file  . Stress: Not on file  Relationships  .  Social connections:    Talks on phone: Not on file    Gets together: Not on file    Attends religious service: Not on file    Active member of club or organization: Not on file    Attends meetings of clubs or organizations: Not on file    Relationship status: Not on file  Other Topics Concern  . Not on file  Social History Narrative  . Not on file     Family History: The patient's family history includes Hypertension in his father. ROS:   Please see the history of present illness.     All other systems reviewed and are negative.  EKGs/Labs/Other Studies Reviewed:    The following studies were reviewed today:  Cardiac catheterization 04/30/2018:  Prox RCA to Mid RCA lesion is 65% stenosed.  Dist RCA-1 lesion is 85% stenosed. Dist RCA-2 lesion is 95% stenosed with 80% stenosed side branch in Post Atrio.  SVG-rPDA graft was visualized by angiography and is normal in caliber and large. The graft exhibits no disease.  Ost RPDA lesion is 45% stenosed. Ost RPDA to prox RPDA lesion is 90% stenosed - This limits flow retrograde to the RPAV-RPL.  Ost LAD lesion is 55% stenosed.  Ost 1st Diag (small caliber vessel) lesion is 70% stenosed.  Prox LAD lesion is 100% stenosed.  LIMA-LAD graft is large caliber, widely patent perfusing a small  caliber distal LAD  Prox Cx to Mid Cx stent is 25% stenosed.  Dist Cx lesion is 65% stenosed. Ost 3rd Mrg to 3rd Mrg lesion is 70% stenosed. -Both limbs of the distal bifurcation.  There is severe left ventricular systolic dysfunction. The left ventricular ejection fraction is less than 25% by visual estimate.  LV end diastolic pressure is moderately elevated.  Significant aortic and mitral valve calcification.  Calcified pericardium  Dilated cardiomyopathy with severely reduced LVEF of roughly 20 to 25%. Severe multivessel disease with patent LIMA-LAD and SVG-PDA. No obvious culprit lesion to explain significant drop in ejection fraction  Plan: Patient will be transferred to [facility] 4 E. TR band removal per protocol in the PACU holding area continue gentle diuresis given elevated LVEDP Cardiomyopathy management with beta-blocker/ARB, spironolactone as tolerated. _____________   Echocardiogram 04/29/2018: Study Conclusions - Left ventricle: The cavity size was normal. Wall thickness was normal. Systolic function was moderately reduced. The estimated ejection fraction was in the range of 35% to 40%. Diffuse hypokinesis. The study is not technically sufficient to allow evaluation of LV diastolic function. - Aortic valve: There was mild regurgitation. - Mitral valve: Severely calcified annulus. There was mild regurgitation. - Left atrium: The atrium was mildly dilated. - Tricuspid valve: There was trivial regurgitation.   EKG:  EKG is  ordered today.    Recent Labs: 04/28/2018: B Natriuretic Peptide 623.2; Magnesium 1.9; TSH 3.571 04/29/2018: ALT 36 04/30/2018: Hemoglobin 10.2; Platelets 298 05/01/2018: BUN 18; Creatinine, Ser 0.88; Potassium 3.7; Sodium 140   Recent Lipid Panel No results found for: CHOL, TRIG, HDL, CHOLHDL, VLDL, LDLCALC, LDLDIRECT  Physical Exam:    VS:  BP (!) 100/52   Pulse 94   Ht 5\' 10"  (1.778 m)   Wt 183 lb 9.6 oz (83.3  kg)   SpO2 95%   BMI 26.34 kg/m     Wt Readings from Last 3 Encounters:  05/07/18 183 lb 9.6 oz (83.3 kg)  05/01/18 177 lb 11.2 oz (80.6 kg)     Physical Exam  Constitutional: He is oriented  to person, place, and time. He appears well-developed and well-nourished. No distress.  HENT:  Head: Normocephalic and atraumatic.  Neck: Normal range of motion. Neck supple. No JVD present.  Cardiovascular: Normal rate, regular rhythm, normal heart sounds and intact distal pulses. Exam reveals no gallop and no friction rub.  No murmur heard. Pulmonary/Chest: Effort normal and breath sounds normal. No respiratory distress. He has no wheezes. He has no rales.  Abdominal: Soft. Bowel sounds are normal.  Musculoskeletal: Normal range of motion. He exhibits no edema or deformity.  Neurological: He is alert and oriented to person, place, and time.  Skin: Skin is warm and dry.  Psychiatric: He has a normal mood and affect. His behavior is normal. Thought content normal.  Vitals reviewed.    ASSESSMENT:    1. Acute systolic CHF (congestive heart failure) (HCC)   2. Dilated cardiomyopathy (HCC)   3. Coronary artery disease involving native coronary artery of native heart with angina pectoris (HCC)   4. Hyperlipidemia LDL goal <70   5. Essential (primary) hypertension    PLAN:    In order of problems listed above:   Acute systolic CHF: NYHA class 3.  Recently reduced LVEF to roughly 20-25% per cardiac cath 04/30/18, 35-40% by echo.  His beta-blocker was switched to carvedilol and he was started on Entresto and Lasix. Pt is not sure that he stopped his prior atenolol. He will check when he gets home. His BP is soft and he has had some lightheadedness. Will decrease lasix to every other day and pt will increase back to daily if increased wt, edema or shortness of breath. No room to titrate meds up today or add spironolactone. HR not optimal. Unable to increase carvedilol. Could consider corlanor if  unable to uptitrate at next visit. Will follow up in 3 weeks to reassess. Pt had a cruise scheduled in 2 weeks. After discussion with pt we think it better to postpone this.   CAD: Status post CABG 2004.  Recent cath showed patent grafts with no target lesions for intervention. No ischemic cause for his decrease in LV function. He continues on aspirin, beta-blocker, statin.  Hyperlipidemia: On atorvastatin 20 mg daily. No lipids since 2017. Will check lipid panel.  Diabetes type 2: Hemoglobin A1c was 6.8 on 04/29/2018. Adequate control.   Hypertension: BP is actually low today and pt has been having lightheadedness. He is not sure that he stopped the atenolol and will check when he gets home. Will maintain current HF meds with no room for up titration.    Medication Adjustments/Labs and Tests Ordered: Current medicines are reviewed at length with the patient today.  Concerns regarding medicines are outlined above. Labs and tests ordered and medication changes are outlined in the patient instructions below:  Patient Instructions  Medication Instructions:  Your physician has recommended you make the following change in your medication:  DECREASE: Lasix 20 mg to every other day   If you need a refill on your cardiac medications, please contact your pharmacy first.  Labwork: Your physician recommends that you return for lab work in: 1 week for kidney function test and fasting lipid panel    Testing/Procedures: None ordered   Follow-Up: Your physician recommends that you schedule a follow-up appointment in: 3 weeks with Dr. Excell Seltzer or Lizabeth Leyden, NP    Any Other Special Instructions Will Be Listed Below (If Applicable).  Heart Failure Heart failure is a condition in which the heart has trouble pumping blood because  it has become weak or stiff. This means that the heart does not pump blood efficiently for the body to work well. For some people with heart failure, fluid may back up into  the lungs and there may be swelling (edema) in the lower legs. Heart failure is usually a long-term (chronic) condition. It is important for you to take good care of yourself and follow the treatment plan from your health care provider. What are the causes? This condition is caused by some health problems, including:  High blood pressure (hypertension). Hypertension causes the heart muscle to work harder than normal. High blood pressure eventually causes the heart to become stiff and weak.  Coronary artery disease (CAD). CAD is the buildup of cholesterol and fat (plaques) in the arteries of the heart.  Heart attack (myocardial infarction). Injured tissue, which is caused by the heart attack, does not contract as well and the heart's ability to pump blood is weakened.  Abnormal heart valves. When the heart valves do not open and close properly, the heart muscle must pump harder to keep the blood flowing.  Heart muscle disease (cardiomyopathy or myocarditis). Heart muscle disease is damage to the heart muscle from a variety of causes, such as drug or alcohol abuse, infections, or unknown causes. These can increase the risk of heart failure.  Lung disease. When the lungs do not work properly, the heart must work harder.  What increases the risk? Risk of heart failure increases as a person ages. This condition is also more likely to develop in people who:  Are overweight.  Are male.  Smoke or chew tobacco.  Abuse alcohol or illegal drugs.  Have taken medicines that can damage the heart, such as chemotherapy drugs.  Have diabetes. ? High blood sugar (glucose) is associated with high fat (lipid) levels in the blood. ? Diabetes can also damage tiny blood vessels that carry nutrients to the heart muscle.  Have abnormal heart rhythms.  Have thyroid problems.  Have low blood counts (anemia).  What are the signs or symptoms? Symptoms of this condition include:  Shortness of breath with  activity, such as when climbing stairs.  Persistent cough.  Swelling of the feet, ankles, legs, or abdomen.  Unexplained weight gain.  Difficulty breathing when lying flat (orthopnea).  Waking from sleep because of the need to sit up and get more air.  Rapid heartbeat.  Fatigue and loss of energy.  Feeling light-headed, dizzy, or close to fainting.  Loss of appetite.  Nausea.  Increased urination during the night (nocturia).  Confusion.  How is this diagnosed? This condition is diagnosed based on:  Medical history, symptoms, and a physical exam.  Diagnostic tests, which may include: ? Echocardiogram. ? Electrocardiogram (ECG). ? Chest X-ray. ? Blood tests. ? Exercise stress test. ? Radionuclide scans. ? Cardiac catheterization and angiogram.  How is this treated? Treatment for this condition is aimed at managing the symptoms of heart failure. Medicines, behavioral changes, or other treatments may be necessary to treat heart failure. Medicines These may include:  Angiotensin-converting enzyme (ACE) inhibitors. This type of medicine blocks the effects of a blood protein called angiotensin-converting enzyme. ACE inhibitors relax (dilate) the blood vessels and help to lower blood pressure.  Angiotensin receptor blockers (ARBs). This type of medicine blocks the actions of a blood protein called angiotensin. ARBs dilate the blood vessels and help to lower blood pressure.  Water pills (diuretics). Diuretics cause the kidneys to remove salt and water from the blood. The  extra fluid is removed through urination, leaving a lower volume of blood that the heart has to pump.  Beta blockers. These improve heart muscle strength and they prevent the heart from beating too quickly.  Digoxin. This increases the force of the heartbeat.  Healthy behavior changes These may include:  Reaching and maintaining a healthy weight.  Stopping smoking or chewing tobacco.  Eating  heart-healthy foods.  Limiting or avoiding alcohol.  Stopping use of street drugs (illegal drugs).  Physical activity.  Other treatments These may include:  Surgery to open blocked coronary arteries or repair damaged heart valves.  Placement of a biventricular pacemaker to improve heart muscle function (cardiac resynchronization therapy). This device paces both the right ventricle and left ventricle.  Placement of a device to treat serious abnormal heart rhythms (implantable cardioverter defibrillator, or ICD).  Placement of a device to improve the pumping ability of the heart (left ventricular assist device, or LVAD).  Heart transplant. This can cure heart failure, and it is considered for certain patients who do not improve with other therapies.  Follow these instructions at home: Medicines  Take over-the-counter and prescription medicines only as told by your health care provider. Medicines are important in reducing the workload of your heart, slowing the progression of heart failure, and improving your symptoms. ? Do not stop taking your medicine unless your health care provider told you to do that. ? Do not skip any dose of medicine. ? Refill your prescriptions before you run out of medicine. You need your medicines every day. Eating and drinking   Eat heart-healthy foods. Talk with a dietitian to make an eating plan that is right for you. ? Choose foods that contain no trans fat and are low in saturated fat and cholesterol. Healthy choices include fresh or frozen fruits and vegetables, fish, lean meats, legumes, fat-free or low-fat dairy products, and whole-grain or high-fiber foods. ? Limit salt (sodium) if directed by your health care provider. Sodium restriction may reduce symptoms of heart failure. Ask a dietitian to recommend heart-healthy seasonings. ? Use healthy cooking methods instead of frying. Healthy methods include roasting, grilling, broiling, baking, poaching,  steaming, and stir-frying.  Limit your fluid intake if directed by your health care provider. Fluid restriction may reduce symptoms of heart failure. Lifestyle  Stop smoking or using chewing tobacco. Nicotine and tobacco can damage your heart and your blood vessels. Do not use nicotine gum or patches before talking to your health care provider.  Limit alcohol intake to no more than 1 drink per day for non-pregnant women and 2 drinks per day for men. One drink equals 12 oz of beer, 5 oz of wine, or 1 oz of hard liquor. ? Drinking more than that is harmful to your heart. Tell your health care provider if you drink alcohol several times a week. ? Talk with your health care provider about whether any level of alcohol use is safe for you. ? If your heart has already been damaged by alcohol or you have severe heart failure, drinking alcohol should be stopped completely.  Stop use of illegal drugs.  Lose weight if directed by your health care provider. Weight loss may reduce symptoms of heart failure.  Do moderate physical activity if directed by your health care provider. People who are elderly and people with severe heart failure should consult with a health care provider for physical activity recommendations. Monitor important information  Weigh yourself every day. Keeping track of your weight daily helps  you to notice excess fluid sooner. ? Weigh yourself every morning after you urinate and before you eat breakfast. ? Wear the same amount of clothing each time you weigh yourself. ? Record your daily weight. Provide your health care provider with your weight record.  Monitor and record your blood pressure as told by your health care provider.  Check your pulse as told by your health care provider. Dealing with extreme temperatures  If the weather is extremely hot: ? Avoid vigorous physical activity. ? Use air conditioning or fans or seek a cooler location. ? Avoid caffeine and  alcohol. ? Wear loose-fitting, lightweight, and light-colored clothing.  If the weather is extremely cold: ? Avoid vigorous physical activity. ? Layer your clothes. ? Wear mittens or gloves, a hat, and a scarf when you go outside. ? Avoid alcohol. General instructions  Manage other health conditions such as hypertension, diabetes, thyroid disease, or abnormal heart rhythms as told by your health care provider.  Learn to manage stress. If you need help to do this, ask your health care provider.  Plan rest periods when fatigued.  Get ongoing education and support as needed.  Participate in or seek rehabilitation as needed to maintain or improve independence and quality of life.  Stay up to date with immunizations. Keeping current on pneumococcal and influenza immunizations is especially important to prevent respiratory infections.  Keep all follow-up visits as told by your health care provider. This is important. Contact a health care provider if:  You have a rapid weight gain.  You have increasing shortness of breath that is unusual for you.  You are unable to participate in your usual physical activities.  You tire easily.  You cough more than normal, especially with physical activity.  You have any swelling or more swelling in areas such as your hands, feet, ankles, or abdomen.  You are unable to sleep because it is hard to breathe.  You feel like your heart is beating quickly (palpitations).  You become dizzy or light-headed when you stand up. Get help right away if:  You have difficulty breathing.  You notice or your family notices a change in your awareness, such as having trouble staying awake or having difficulty with concentration.  You have pain or discomfort in your chest.  You have an episode of fainting (syncope). This information is not intended to replace advice given to you by your health care provider. Make sure you discuss any questions you have with  your health care provider. Document Released: 11/26/2005 Document Revised: 07/31/2016 Document Reviewed: 06/20/2016 Elsevier Interactive Patient Education  2018 [employer].   DASH Eating Plan DASH stands for "Dietary Approaches to Stop Hypertension." The DASH eating plan is a healthy eating plan that has been shown to reduce high blood pressure (hypertension). It may also reduce your risk for type 2 diabetes, heart disease, and stroke. The DASH eating plan may also help with weight loss. What are tips for following this plan? General guidelines  Avoid eating more than 2,300 mg (milligrams) of salt (sodium) a day. If you have hypertension, you may need to reduce your sodium intake to 1,500 mg a day.  Limit alcohol intake to no more than 1 drink a day for nonpregnant women and 2 drinks a day for men. One drink equals 12 oz of beer, 5 oz of wine, or 1 oz of hard liquor.  Work with your health care provider to maintain a healthy body weight or to lose weight. Ask  what an ideal weight is for you.  Get at least 30 minutes of exercise that causes your heart to beat faster (aerobic exercise) most days of the week. Activities may include walking, swimming, or biking.  Work with your health care provider or diet and nutrition specialist (dietitian) to adjust your eating plan to your individual calorie needs. Reading food labels  Check food labels for the amount of sodium per serving. Choose foods with less than 5 percent of the Daily Value of sodium. Generally, foods with less than 300 mg of sodium per serving fit into this eating plan.  To find whole grains, look for the word "whole" as the first word in the ingredient list. Shopping  Buy products labeled as "low-sodium" or "no salt added."  Buy fresh foods. Avoid canned foods and premade or frozen meals. Cooking  Avoid adding salt when cooking. Use salt-free seasonings or herbs instead of table salt or sea salt. Check with your health care  provider or pharmacist before using salt substitutes.  Do not fry foods. Cook foods using healthy methods such as baking, boiling, grilling, and broiling instead.  Cook with heart-healthy oils, such as olive, canola, soybean, or sunflower oil. Meal planning   Eat a balanced diet that includes: ? 5 or more servings of fruits and vegetables each day. At each meal, try to fill half of your plate with fruits and vegetables. ? Up to 6-8 servings of whole grains each day. ? Less than 6 oz of lean meat, poultry, or fish each day. A 3-oz serving of meat is about the same size as a deck of cards. One egg equals 1 oz. ? 2 servings of low-fat dairy each day. ? A serving of nuts, seeds, or beans 5 times each week. ? Heart-healthy fats. Healthy fats called Omega-3 fatty acids are found in foods such as flaxseeds and coldwater fish, like sardines, salmon, and mackerel.  Limit how much you eat of the following: ? Canned or prepackaged foods. ? Food that is high in trans fat, such as fried foods. ? Food that is high in saturated fat, such as fatty meat. ? Sweets, desserts, sugary drinks, and other foods with added sugar. ? Full-fat dairy products.  Do not salt foods before eating.  Try to eat at least 2 vegetarian meals each week.  Eat more home-cooked food and less restaurant, buffet, and fast food.  When eating at a restaurant, ask that your food be prepared with less salt or no salt, if possible. What foods are recommended? The items listed may not be a complete list. Talk with your dietitian about what dietary choices are best for you. Grains Whole-grain or whole-wheat bread. Whole-grain or whole-wheat pasta. Brown rice. Orpah Cobb. Bulgur. Whole-grain and low-sodium cereals. Pita bread. Low-fat, low-sodium crackers. Whole-wheat flour tortillas. Vegetables Fresh or frozen vegetables (raw, steamed, roasted, or grilled). Low-sodium or reduced-sodium tomato and vegetable juice. Low-sodium or  reduced-sodium tomato sauce and tomato paste. Low-sodium or reduced-sodium canned vegetables. Fruits All fresh, dried, or frozen fruit. Canned fruit in natural juice (without added sugar). Meat and other protein foods Skinless chicken or [place]. Ground chicken or [place]. Pork with fat trimmed off. Fish and seafood. Egg whites. Dried beans, peas, or lentils. Unsalted nuts, nut butters, and seeds. Unsalted canned beans. Lean cuts of beef with fat trimmed off. Low-sodium, lean deli meat. Dairy Low-fat (1%) or fat-free (skim) milk. Fat-free, low-fat, or reduced-fat cheeses. Nonfat, low-sodium ricotta or cottage cheese. Low-fat or nonfat yogurt. Low-fat,  low-sodium cheese. Fats and oils Soft margarine without trans fats. Vegetable oil. Low-fat, reduced-fat, or light mayonnaise and salad dressings (reduced-sodium). Canola, safflower, olive, soybean, and sunflower oils. Avocado. Seasoning and other foods Herbs. Spices. Seasoning mixes without salt. Unsalted popcorn and pretzels. Fat-free sweets. What foods are not recommended? The items listed may not be a complete list. Talk with your dietitian about what dietary choices are best for you. Grains Baked goods made with fat, such as croissants, muffins, or some breads. Dry pasta or rice meal packs. Vegetables Creamed or fried vegetables. Vegetables in a cheese sauce. Regular canned vegetables (not low-sodium or reduced-sodium). Regular canned tomato sauce and paste (not low-sodium or reduced-sodium). Regular tomato and vegetable juice (not low-sodium or reduced-sodium). Rosita Fire. Olives. Fruits Canned fruit in a light or heavy syrup. Fried fruit. Fruit in cream or butter sauce. Meat and other protein foods Fatty cuts of meat. Ribs. Fried meat. Tomasa Blase. Sausage. Bologna and other processed lunch meats. Salami. Fatback. Hotdogs. Bratwurst. Salted nuts and seeds. Canned beans with added salt. Canned or smoked fish. Whole eggs or egg yolks. Chicken or [place]  with skin. Dairy Whole or 2% milk, cream, and half-and-half. Whole or full-fat cream cheese. Whole-fat or sweetened yogurt. Full-fat cheese. Nondairy creamers. Whipped toppings. Processed cheese and cheese spreads. Fats and oils Butter. Stick margarine. Lard. Shortening. Ghee. Bacon fat. Tropical oils, such as coconut, palm kernel, or palm oil. Seasoning and other foods Salted popcorn and pretzels. Onion salt, garlic salt, seasoned salt, table salt, and sea salt. Worcestershire sauce. Tartar sauce. Barbecue sauce. Teriyaki sauce. Soy sauce, including reduced-sodium. Steak sauce. Canned and packaged gravies. Fish sauce. Oyster sauce. Cocktail sauce. Horseradish that you find on the shelf. Ketchup. Mustard. Meat flavorings and tenderizers. Bouillon cubes. Hot sauce and Tabasco sauce. Premade or packaged marinades. Premade or packaged taco seasonings. Relishes. Regular salad dressings. Where to find more information:  National Heart, Lung, and Blood Institute: PopSteam.is  American Heart Association: www.heart.org Summary  The DASH eating plan is a healthy eating plan that has been shown to reduce high blood pressure (hypertension). It may also reduce your risk for type 2 diabetes, heart disease, and stroke.  With the DASH eating plan, you should limit salt (sodium) intake to 2,300 mg a day. If you have hypertension, you may need to reduce your sodium intake to 1,500 mg a day.  When on the DASH eating plan, aim to eat more fresh fruits and vegetables, whole grains, lean proteins, low-fat dairy, and heart-healthy fats.  Work with your health care provider or diet and nutrition specialist (dietitian) to adjust your eating plan to your individual calorie needs. This information is not intended to replace advice given to you by your health care provider. Make sure you discuss any questions you have with your health care provider. Document Released: 11/15/2011 Document Revised: 11/19/2016  Document Reviewed: 11/19/2016 Elsevier Interactive Patient Education  2018 [employer].   Thank you for choosing [facility]    Lyda Perone, [place]  [phone]  If you need a refill on your cardiac medications before your next appointment, please call your pharmacy.      Signed, Berton Bon, NP  05/08/2018 5:52 AM    [facility]

## 2018-05-07 NOTE — Telephone Encounter (Signed)
**Note De-Identified  Obfuscation** I have done an [employer] PA through covermymeds.  I did get an immediate response as follows: Message from Plan Case Id:[id number];Status:Approved;Review Type:Prior Auth;Coverage Start Date:04/07/2018;Coverage End Date:05/07/2019;

## 2018-05-07 NOTE — Patient Instructions (Addendum)
Medication Instructions:  Your physician has recommended you make the following change in your medication:  DECREASE: Lasix 20 mg to every other day   If you need a refill on your cardiac medications, please contact your pharmacy first.  Labwork: Your physician recommends that you return for lab work in: 1 week for kidney function test and fasting lipid panel    Testing/Procedures: None ordered   Follow-Up: Your physician recommends that you schedule a follow-up appointment in: 3 weeks with Dr. Excell Seltzer or Lizabeth Leyden, NP    Any Other Special Instructions Will Be Listed Below (If Applicable).  Heart Failure Heart failure is a condition in which the heart has trouble pumping blood because it has become weak or stiff. This means that the heart does not pump blood efficiently for the body to work well. For some people with heart failure, fluid may back up into the lungs and there may be swelling (edema) in the lower legs. Heart failure is usually a long-term (chronic) condition. It is important for you to take good care of yourself and follow the treatment plan from your health care provider. What are the causes? This condition is caused by some health problems, including:  High blood pressure (hypertension). Hypertension causes the heart muscle to work harder than normal. High blood pressure eventually causes the heart to become stiff and weak.  Coronary artery disease (CAD). CAD is the buildup of cholesterol and fat (plaques) in the arteries of the heart.  Heart attack (myocardial infarction). Injured tissue, which is caused by the heart attack, does not contract as well and the heart's ability to pump blood is weakened.  Abnormal heart valves. When the heart valves do not open and close properly, the heart muscle must pump harder to keep the blood flowing.  Heart muscle disease (cardiomyopathy or myocarditis). Heart muscle disease is damage to the heart muscle from a variety of causes,  such as drug or alcohol abuse, infections, or unknown causes. These can increase the risk of heart failure.  Lung disease. When the lungs do not work properly, the heart must work harder.  What increases the risk? Risk of heart failure increases as a person ages. This condition is also more likely to develop in people who:  Are overweight.  Are male.  Smoke or chew tobacco.  Abuse alcohol or illegal drugs.  Have taken medicines that can damage the heart, such as chemotherapy drugs.  Have diabetes. ? High blood sugar (glucose) is associated with high fat (lipid) levels in the blood. ? Diabetes can also damage tiny blood vessels that carry nutrients to the heart muscle.  Have abnormal heart rhythms.  Have thyroid problems.  Have low blood counts (anemia).  What are the signs or symptoms? Symptoms of this condition include:  Shortness of breath with activity, such as when climbing stairs.  Persistent cough.  Swelling of the feet, ankles, legs, or abdomen.  Unexplained weight gain.  Difficulty breathing when lying flat (orthopnea).  Waking from sleep because of the need to sit up and get more air.  Rapid heartbeat.  Fatigue and loss of energy.  Feeling light-headed, dizzy, or close to fainting.  Loss of appetite.  Nausea.  Increased urination during the night (nocturia).  Confusion.  How is this diagnosed? This condition is diagnosed based on:  Medical history, symptoms, and a physical exam.  Diagnostic tests, which may include: ? Echocardiogram. ? Electrocardiogram (ECG). ? Chest X-ray. ? Blood tests. ? Exercise stress test. ? Radionuclide  scans. ? Cardiac catheterization and angiogram.  How is this treated? Treatment for this condition is aimed at managing the symptoms of heart failure. Medicines, behavioral changes, or other treatments may be necessary to treat heart failure. Medicines These may include:  Angiotensin-converting enzyme (ACE)  inhibitors. This type of medicine blocks the effects of a blood protein called angiotensin-converting enzyme. ACE inhibitors relax (dilate) the blood vessels and help to lower blood pressure.  Angiotensin receptor blockers (ARBs). This type of medicine blocks the actions of a blood protein called angiotensin. ARBs dilate the blood vessels and help to lower blood pressure.  Water pills (diuretics). Diuretics cause the kidneys to remove salt and water from the blood. The extra fluid is removed through urination, leaving a lower volume of blood that the heart has to pump.  Beta blockers. These improve heart muscle strength and they prevent the heart from beating too quickly.  Digoxin. This increases the force of the heartbeat.  Healthy behavior changes These may include:  Reaching and maintaining a healthy weight.  Stopping smoking or chewing tobacco.  Eating heart-healthy foods.  Limiting or avoiding alcohol.  Stopping use of street drugs (illegal drugs).  Physical activity.  Other treatments These may include:  Surgery to open blocked coronary arteries or repair damaged heart valves.  Placement of a biventricular pacemaker to improve heart muscle function (cardiac resynchronization therapy). This device paces both the right ventricle and left ventricle.  Placement of a device to treat serious abnormal heart rhythms (implantable cardioverter defibrillator, or ICD).  Placement of a device to improve the pumping ability of the heart (left ventricular assist device, or LVAD).  Heart transplant. This can cure heart failure, and it is considered for certain patients who do not improve with other therapies.  Follow these instructions at home: Medicines  Take over-the-counter and prescription medicines only as told by your health care provider. Medicines are important in reducing the workload of your heart, slowing the progression of heart failure, and improving your symptoms. ? Do  not stop taking your medicine unless your health care provider told you to do that. ? Do not skip any dose of medicine. ? Refill your prescriptions before you run out of medicine. You need your medicines every day. Eating and drinking   Eat heart-healthy foods. Talk with a dietitian to make an eating plan that is right for you. ? Choose foods that contain no trans fat and are low in saturated fat and cholesterol. Healthy choices include fresh or frozen fruits and vegetables, fish, lean meats, legumes, fat-free or low-fat dairy products, and whole-grain or high-fiber foods. ? Limit salt (sodium) if directed by your health care provider. Sodium restriction may reduce symptoms of heart failure. Ask a dietitian to recommend heart-healthy seasonings. ? Use healthy cooking methods instead of frying. Healthy methods include roasting, grilling, broiling, baking, poaching, steaming, and stir-frying.  Limit your fluid intake if directed by your health care provider. Fluid restriction may reduce symptoms of heart failure. Lifestyle  Stop smoking or using chewing tobacco. Nicotine and tobacco can damage your heart and your blood vessels. Do not use nicotine gum or patches before talking to your health care provider.  Limit alcohol intake to no more than 1 drink per day for non-pregnant women and 2 drinks per day for men. One drink equals 12 oz of beer, 5 oz of wine, or 1 oz of hard liquor. ? Drinking more than that is harmful to your heart. Tell your health care provider if  you drink alcohol several times a week. ? Talk with your health care provider about whether any level of alcohol use is safe for you. ? If your heart has already been damaged by alcohol or you have severe heart failure, drinking alcohol should be stopped completely.  Stop use of illegal drugs.  Lose weight if directed by your health care provider. Weight loss may reduce symptoms of heart failure.  Do moderate physical activity if  directed by your health care provider. People who are elderly and people with severe heart failure should consult with a health care provider for physical activity recommendations. Monitor important information  Weigh yourself every day. Keeping track of your weight daily helps you to notice excess fluid sooner. ? Weigh yourself every morning after you urinate and before you eat breakfast. ? Wear the same amount of clothing each time you weigh yourself. ? Record your daily weight. Provide your health care provider with your weight record.  Monitor and record your blood pressure as told by your health care provider.  Check your pulse as told by your health care provider. Dealing with extreme temperatures  If the weather is extremely hot: ? Avoid vigorous physical activity. ? Use air conditioning or fans or seek a cooler location. ? Avoid caffeine and alcohol. ? Wear loose-fitting, lightweight, and light-colored clothing.  If the weather is extremely cold: ? Avoid vigorous physical activity. ? Layer your clothes. ? Wear mittens or gloves, a hat, and a scarf when you go outside. ? Avoid alcohol. General instructions  Manage other health conditions such as hypertension, diabetes, thyroid disease, or abnormal heart rhythms as told by your health care provider.  Learn to manage stress. If you need help to do this, ask your health care provider.  Plan rest periods when fatigued.  Get ongoing education and support as needed.  Participate in or seek rehabilitation as needed to maintain or improve independence and quality of life.  Stay up to date with immunizations. Keeping current on pneumococcal and influenza immunizations is especially important to prevent respiratory infections.  Keep all follow-up visits as told by your health care provider. This is important. Contact a health care provider if:  You have a rapid weight gain.  You have increasing shortness of breath that is  unusual for you.  You are unable to participate in your usual physical activities.  You tire easily.  You cough more than normal, especially with physical activity.  You have any swelling or more swelling in areas such as your hands, feet, ankles, or abdomen.  You are unable to sleep because it is hard to breathe.  You feel like your heart is beating quickly (palpitations).  You become dizzy or light-headed when you stand up. Get help right away if:  You have difficulty breathing.  You notice or your family notices a change in your awareness, such as having trouble staying awake or having difficulty with concentration.  You have pain or discomfort in your chest.  You have an episode of fainting (syncope). This information is not intended to replace advice given to you by your health care provider. Make sure you discuss any questions you have with your health care provider. Document Released: 11/26/2005 Document Revised: 07/31/2016 Document Reviewed: 06/20/2016 Elsevier Interactive Patient Education  2018 [employer].   DASH Eating Plan DASH stands for "Dietary Approaches to Stop Hypertension." The DASH eating plan is a healthy eating plan that has been shown to reduce high blood pressure (hypertension). It  may also reduce your risk for type 2 diabetes, heart disease, and stroke. The DASH eating plan may also help with weight loss. What are tips for following this plan? General guidelines  Avoid eating more than 2,300 mg (milligrams) of salt (sodium) a day. If you have hypertension, you may need to reduce your sodium intake to 1,500 mg a day.  Limit alcohol intake to no more than 1 drink a day for nonpregnant women and 2 drinks a day for men. One drink equals 12 oz of beer, 5 oz of wine, or 1 oz of hard liquor.  Work with your health care provider to maintain a healthy body weight or to lose weight. Ask what an ideal weight is for you.  Get at least 30 minutes of exercise  that causes your heart to beat faster (aerobic exercise) most days of the week. Activities may include walking, swimming, or biking.  Work with your health care provider or diet and nutrition specialist (dietitian) to adjust your eating plan to your individual calorie needs. Reading food labels  Check food labels for the amount of sodium per serving. Choose foods with less than 5 percent of the Daily Value of sodium. Generally, foods with less than 300 mg of sodium per serving fit into this eating plan.  To find whole grains, look for the word "whole" as the first word in the ingredient list. Shopping  Buy products labeled as "low-sodium" or "no salt added."  Buy fresh foods. Avoid canned foods and premade or frozen meals. Cooking  Avoid adding salt when cooking. Use salt-free seasonings or herbs instead of table salt or sea salt. Check with your health care provider or pharmacist before using salt substitutes.  Do not fry foods. Cook foods using healthy methods such as baking, boiling, grilling, and broiling instead.  Cook with heart-healthy oils, such as olive, canola, soybean, or sunflower oil. Meal planning   Eat a balanced diet that includes: ? 5 or more servings of fruits and vegetables each day. At each meal, try to fill half of your plate with fruits and vegetables. ? Up to 6-8 servings of whole grains each day. ? Less than 6 oz of lean meat, poultry, or fish each day. A 3-oz serving of meat is about the same size as a deck of cards. One egg equals 1 oz. ? 2 servings of low-fat dairy each day. ? A serving of nuts, seeds, or beans 5 times each week. ? Heart-healthy fats. Healthy fats called Omega-3 fatty acids are found in foods such as flaxseeds and coldwater fish, like sardines, salmon, and mackerel.  Limit how much you eat of the following: ? Canned or prepackaged foods. ? Food that is high in trans fat, such as fried foods. ? Food that is high in saturated fat, such as  fatty meat. ? Sweets, desserts, sugary drinks, and other foods with added sugar. ? Full-fat dairy products.  Do not salt foods before eating.  Try to eat at least 2 vegetarian meals each week.  Eat more home-cooked food and less restaurant, buffet, and fast food.  When eating at a restaurant, ask that your food be prepared with less salt or no salt, if possible. What foods are recommended? The items listed may not be a complete list. Talk with your dietitian about what dietary choices are best for you. Grains Whole-grain or whole-wheat bread. Whole-grain or whole-wheat pasta. Brown rice. Orpah Cobb. Bulgur. Whole-grain and low-sodium cereals. Pita bread. Low-fat, low-sodium crackers. Whole-wheat  flour tortillas. Vegetables Fresh or frozen vegetables (raw, steamed, roasted, or grilled). Low-sodium or reduced-sodium tomato and vegetable juice. Low-sodium or reduced-sodium tomato sauce and tomato paste. Low-sodium or reduced-sodium canned vegetables. Fruits All fresh, dried, or frozen fruit. Canned fruit in natural juice (without added sugar). Meat and other protein foods Skinless chicken or [place]. Ground chicken or [place]. Pork with fat trimmed off. Fish and seafood. Egg whites. Dried beans, peas, or lentils. Unsalted nuts, nut butters, and seeds. Unsalted canned beans. Lean cuts of beef with fat trimmed off. Low-sodium, lean deli meat. Dairy Low-fat (1%) or fat-free (skim) milk. Fat-free, low-fat, or reduced-fat cheeses. Nonfat, low-sodium ricotta or cottage cheese. Low-fat or nonfat yogurt. Low-fat, low-sodium cheese. Fats and oils Soft margarine without trans fats. Vegetable oil. Low-fat, reduced-fat, or light mayonnaise and salad dressings (reduced-sodium). Canola, safflower, olive, soybean, and sunflower oils. Avocado. Seasoning and other foods Herbs. Spices. Seasoning mixes without salt. Unsalted popcorn and pretzels. Fat-free sweets. What foods are not recommended? The items  listed may not be a complete list. Talk with your dietitian about what dietary choices are best for you. Grains Baked goods made with fat, such as croissants, muffins, or some breads. Dry pasta or rice meal packs. Vegetables Creamed or fried vegetables. Vegetables in a cheese sauce. Regular canned vegetables (not low-sodium or reduced-sodium). Regular canned tomato sauce and paste (not low-sodium or reduced-sodium). Regular tomato and vegetable juice (not low-sodium or reduced-sodium). Rosita Fire. Olives. Fruits Canned fruit in a light or heavy syrup. Fried fruit. Fruit in cream or butter sauce. Meat and other protein foods Fatty cuts of meat. Ribs. Fried meat. Tomasa Blase. Sausage. Bologna and other processed lunch meats. Salami. Fatback. Hotdogs. Bratwurst. Salted nuts and seeds. Canned beans with added salt. Canned or smoked fish. Whole eggs or egg yolks. Chicken or [place] with skin. Dairy Whole or 2% milk, cream, and half-and-half. Whole or full-fat cream cheese. Whole-fat or sweetened yogurt. Full-fat cheese. Nondairy creamers. Whipped toppings. Processed cheese and cheese spreads. Fats and oils Butter. Stick margarine. Lard. Shortening. Ghee. Bacon fat. Tropical oils, such as coconut, palm kernel, or palm oil. Seasoning and other foods Salted popcorn and pretzels. Onion salt, garlic salt, seasoned salt, table salt, and sea salt. Worcestershire sauce. Tartar sauce. Barbecue sauce. Teriyaki sauce. Soy sauce, including reduced-sodium. Steak sauce. Canned and packaged gravies. Fish sauce. Oyster sauce. Cocktail sauce. Horseradish that you find on the shelf. Ketchup. Mustard. Meat flavorings and tenderizers. Bouillon cubes. Hot sauce and Tabasco sauce. Premade or packaged marinades. Premade or packaged taco seasonings. Relishes. Regular salad dressings. Where to find more information:  National Heart, Lung, and Blood Institute: PopSteam.is  American Heart Association: www.heart.org Summary  The  DASH eating plan is a healthy eating plan that has been shown to reduce high blood pressure (hypertension). It may also reduce your risk for type 2 diabetes, heart disease, and stroke.  With the DASH eating plan, you should limit salt (sodium) intake to 2,300 mg a day. If you have hypertension, you may need to reduce your sodium intake to 1,500 mg a day.  When on the DASH eating plan, aim to eat more fresh fruits and vegetables, whole grains, lean proteins, low-fat dairy, and heart-healthy fats.  Work with your health care provider or diet and nutrition specialist (dietitian) to adjust your eating plan to your individual calorie needs. This information is not intended to replace advice given to you by your health care provider. Make sure you discuss any questions you have with your health care  provider. Document Released: 11/15/2011 Document Revised: 11/19/2016 Document Reviewed: 11/19/2016 Elsevier Interactive Patient Education  2018 [employer].   Thank you for choosing [facility]    Lyda Perone, [place]  [phone]  If you need a refill on your cardiac medications before your next appointment, please call your pharmacy.

## 2018-05-08 ENCOUNTER — Other Ambulatory Visit: Payer: Self-pay

## 2018-05-08 DIAGNOSIS — I5021 Acute systolic (congestive) heart failure: Secondary | ICD-10-CM

## 2018-05-09 LAB — BASIC METABOLIC PANEL
BUN / CREAT RATIO: 32 — AB (ref 10–24)
BUN: 29 mg/dL — ABNORMAL HIGH (ref 8–27)
CHLORIDE: 101 mmol/L (ref 96–106)
CO2: 26 mmol/L (ref 20–29)
Calcium: 9.4 mg/dL (ref 8.6–10.2)
Creatinine, Ser: 0.91 mg/dL (ref 0.76–1.27)
GFR calc non Af Amer: 85 mL/min/{1.73_m2} (ref >59)
GFR, EST AFRICAN AMERICAN: 98 mL/min/{1.73_m2} (ref >59)
GLUCOSE: 146 mg/dL — AB (ref 65–99)
Potassium: 4.8 mmol/L (ref 3.5–5.2)
SODIUM: 141 mmol/L (ref 134–144)

## 2018-05-09 LAB — LIPID PANEL
CHOL/HDL RATIO: 3.1 ratio (ref 0.0–5.0)
Cholesterol, Total: 120 mg/dL (ref 100–199)
HDL: 39 mg/dL — ABNORMAL LOW (ref >39)
LDL Calculated: 66 mg/dL (ref 0–99)
TRIGLYCERIDES: 77 mg/dL (ref 0–149)
VLDL CHOLESTEROL CAL: 15 mg/dL (ref 5–40)

## 2018-05-12 ENCOUNTER — Encounter (INDEPENDENT_AMBULATORY_CARE_PROVIDER_SITE_OTHER): Payer: Self-pay

## 2018-05-12 DIAGNOSIS — F411 Generalized anxiety disorder: Secondary | ICD-10-CM | POA: Diagnosis not present

## 2018-05-16 ENCOUNTER — Ambulatory Visit: Payer: BLUE CROSS/BLUE SHIELD | Admitting: Family Medicine

## 2018-05-16 ENCOUNTER — Telehealth: Payer: Self-pay | Admitting: *Deleted

## 2018-05-16 ENCOUNTER — Telehealth: Payer: Self-pay

## 2018-05-16 NOTE — Telephone Encounter (Signed)
Copied from CRM [id number]. Topic: Quick Communication - Appointment Cancellation >> May 16, 2018  8:55 AM Gerrianne Scale wrote: Patient called to cancel appointment scheduled for 05-16-18. Patient has not rescheduled their appointment.  Route to department's PEC pool.

## 2018-05-16 NOTE — Telephone Encounter (Signed)
Called pt and LVM about rescheduling his NP appt. Advised to call back to reschedule.

## 2018-05-16 NOTE — Telephone Encounter (Signed)
Alec Taylor -- can you contact pt to r/s appointment?

## 2018-05-16 NOTE — Telephone Encounter (Signed)
Copied from CRM #[id number]. Topic: Quick Communication - Appointment Cancellation >> May 16, 2018  8:55 AM Payne, Angela L wrote: Patient called to cancel appointment scheduled for 05-16-18. Patient has not rescheduled their appointment.  Route to department's PEC pool. 

## 2018-05-20 ENCOUNTER — Telehealth: Payer: Self-pay | Admitting: Cardiovascular Disease

## 2018-05-20 NOTE — Telephone Encounter (Signed)
New Message    Patients wife is calling on behalf of spouse she indicates that his BP has been slightly elevated and he gained 2.5 lbs overnight. At this time she is not sure as to what they can do because they have been advised that this may be a sign of congestive heart failure. They will be at [facility] if with daughter that has been diagnosis with a tumor if they need to get a referral there. The wife ask that she is contacted first but it you can not reach her then call Shyam at [phone].   Pt c/o BP issue:  1. What are your last 5 BP readings? 145/84 2. Are you having any other symptoms (ex. Dizziness, headache, blurred vision, passed out)? No symptoms 3. What is your medication issue? None   Pt c/o swelling: STAT is pt has developed SOB within 24 hours  1) How much weight have you gained and in what time span?  2.5 lbs    2) If swelling, where is the swelling located?  Unknown   3) Are you currently taking a fluid pill? yes   4) Are you currently SOB? No   5) Do you have a log of your daily weights (if so, list)? no  6) Have you gained 3 pounds in a day or 5 pounds in a week? no  7) Have you traveled recently? No

## 2018-05-21 NOTE — Telephone Encounter (Signed)
Spoke with Alec Taylor and he states that he is currently at the hospital due to his daughter having surgery. He states that he is feeling fine and he is scheduled to be seen at the Heart failure clinic on 6/19. Alec Taylor is scheduled to see Berton Bon on 07/17/18

## 2018-05-28 ENCOUNTER — Encounter (HOSPITAL_COMMUNITY): Payer: Self-pay

## 2018-05-28 ENCOUNTER — Ambulatory Visit (HOSPITAL_COMMUNITY)
Admission: RE | Admit: 2018-05-28 | Discharge: 2018-05-28 | Disposition: A | Discharge status: Home / Self Care | Payer: BLUE CROSS/BLUE SHIELD | Source: Ambulatory Visit | Attending: Cardiology | Admitting: Cardiology

## 2018-05-28 VITALS — BP 124/70 | HR 84 | Wt 189.0 lb

## 2018-05-28 DIAGNOSIS — Z79899 Other long term (current) drug therapy: Secondary | ICD-10-CM | POA: Insufficient documentation

## 2018-05-28 DIAGNOSIS — I2510 Atherosclerotic heart disease of native coronary artery without angina pectoris: Secondary | ICD-10-CM | POA: Diagnosis not present

## 2018-05-28 DIAGNOSIS — E119 Type 2 diabetes mellitus without complications: Secondary | ICD-10-CM | POA: Insufficient documentation

## 2018-05-28 DIAGNOSIS — E669 Obesity, unspecified: Secondary | ICD-10-CM | POA: Diagnosis not present

## 2018-05-28 DIAGNOSIS — Z951 Presence of aortocoronary bypass graft: Secondary | ICD-10-CM | POA: Insufficient documentation

## 2018-05-28 DIAGNOSIS — I493 Ventricular premature depolarization: Secondary | ICD-10-CM | POA: Diagnosis not present

## 2018-05-28 DIAGNOSIS — D649 Anemia, unspecified: Secondary | ICD-10-CM | POA: Diagnosis not present

## 2018-05-28 DIAGNOSIS — E785 Hyperlipidemia, unspecified: Secondary | ICD-10-CM | POA: Diagnosis not present

## 2018-05-28 DIAGNOSIS — I255 Ischemic cardiomyopathy: Secondary | ICD-10-CM | POA: Diagnosis not present

## 2018-05-28 DIAGNOSIS — I5022 Chronic systolic (congestive) heart failure: Secondary | ICD-10-CM | POA: Diagnosis not present

## 2018-05-28 DIAGNOSIS — Z7984 Long term (current) use of oral hypoglycemic drugs: Secondary | ICD-10-CM | POA: Diagnosis not present

## 2018-05-28 DIAGNOSIS — I110 Hypertensive heart disease with heart failure: Secondary | ICD-10-CM | POA: Diagnosis not present

## 2018-05-28 DIAGNOSIS — Z9884 Bariatric surgery status: Secondary | ICD-10-CM | POA: Diagnosis not present

## 2018-05-28 DIAGNOSIS — J449 Chronic obstructive pulmonary disease, unspecified: Secondary | ICD-10-CM | POA: Diagnosis not present

## 2018-05-28 DIAGNOSIS — F329 Major depressive disorder, single episode, unspecified: Secondary | ICD-10-CM | POA: Insufficient documentation

## 2018-05-28 DIAGNOSIS — Z6827 Body mass index (BMI) 27.0-27.9, adult: Secondary | ICD-10-CM | POA: Insufficient documentation

## 2018-05-28 LAB — FOLATE: Folate: 22 ng/mL (ref >5.9)

## 2018-05-28 LAB — CBC
HCT: 33.8 % — ABNORMAL LOW (ref 39.0–52.0)
Hemoglobin: 10.1 g/dL — ABNORMAL LOW (ref 13.0–17.0)
MCH: 25.1 pg — AB (ref 26.0–34.0)
MCHC: 29.9 g/dL — ABNORMAL LOW (ref 30.0–36.0)
MCV: 84.1 fL (ref 78.0–100.0)
Platelets: 216 10*3/uL (ref 150–400)
RBC: 4.02 MIL/uL — AB (ref 4.22–5.81)
RDW: 14.2 % (ref 11.5–15.5)
WBC: 5.3 10*3/uL (ref 4.0–10.5)

## 2018-05-28 LAB — BASIC METABOLIC PANEL
Anion gap: 9 (ref 5–15)
BUN: 25 mg/dL — ABNORMAL HIGH (ref 6–20)
CHLORIDE: 105 mmol/L (ref 101–111)
CO2: 29 mmol/L (ref 22–32)
CREATININE: 0.85 mg/dL (ref 0.61–1.24)
Calcium: 9.1 mg/dL (ref 8.9–10.3)
GFR calc non Af Amer: >60 mL/min (ref >60)
Glucose, Bld: 170 mg/dL — ABNORMAL HIGH (ref 65–99)
POTASSIUM: 4.4 mmol/L (ref 3.5–5.1)
SODIUM: 143 mmol/L (ref 135–145)

## 2018-05-28 LAB — FERRITIN: FERRITIN: 26 ng/mL (ref 24–336)

## 2018-05-28 LAB — IRON AND TIBC
IRON: 36 ug/dL — AB (ref 45–182)
SATURATION RATIOS: 8 % — AB (ref 17.9–39.5)
TIBC: 459 ug/dL — AB (ref 250–450)
UIBC: 423 ug/dL

## 2018-05-28 LAB — VITAMIN B12: Vitamin B-12: 456 pg/mL (ref 180–914)

## 2018-05-28 MED ORDER — SACUBITRIL-VALSARTAN 49-51 MG PO TABS: 1.0000 | ORAL_TABLET | Freq: Two times a day (BID) | ORAL | 11 refills | Status: DC

## 2018-05-28 NOTE — Patient Instructions (Signed)
Routine lab work today. Will notify you of abnormal results, otherwise no news is good news!  INCREASE Entresto to 49-51 mg twice daily. Can double up on current 24-26 mg tablets you have at home (Take 2 tabs twice daily). New Rx has been sent to your pharmacy for 49-51 mg tablets (Take 1 tab twice daily).  Follow up with Dr. [name] in 2 weeks.  Take all medication as prescribed the day of your appointment. Bring all medications with you to your appointment.  Do the following things EVERYDAY: 1) Weigh yourself in the morning before breakfast. Write it down and keep it in a log. 2) Take your medicines as prescribed 3) Eat low salt foods-Limit salt (sodium) to 2000 mg per day.  4) Stay as active as you can everyday 5) Limit all fluids for the day to less than 2 liters

## 2018-05-28 NOTE — Progress Notes (Signed)
ADVANCED HF CLINIC CONSULT NOTE  Referring Physician: Dr Excell Seltzer Primary Care: Dr Derrell Lolling Primary [facility] Cardiologist: Dr Beverely Pace   HPI:  Mr Fruth is a 72 year old referred by Dr Excell Seltzer for heart failure management,.   Mr Hopfensperger is a 72 year old with history of CAD s/p CABG 2004 at [facility], HTN, hyperlipidemia, DMII, obesity s/p gastric bypass and newly diagnosed heart failure.   Echocardiogram1/2019 during his last hospital admission revealed LVEF of 45 to 50%.   Recently diagnosed with acute systolic heart failure. He saw his PCP on 04/18/2018 for SOB. Patient was started on albuterol, PFTs were ordered and patient was to get CXR.[name]   Admitted 04/28/18 with increased dyspnea. CTA negative for PE. ECHO performed and showed reduced EF 35-40%. He underwent LHC . Stable coronary disease. Diuresed well. Discharge weight was 177 pounds.   Overall feeling fine. Under a lot of stress with his 19 year old daughter's illness. She was diagnosed wit a brain tumor and remains a [facility] in the ICU with likely GBM. Denies SOB/PND/Orthopnea. No chest pain. Appetite ok. No fever or chills. Weight at home 178-184 pounds.Taking all medications. 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    ECHO 04/29/2018  Left ventricle: The cavity size was normal. Wall thickness was   normal. Systolic function was moderately reduced. The estimated   ejection fraction was in the range of 35% to 40%. Diffuse   hypokinesis. The study is not technically sufficient to allow   evaluation of LV diastolic function. - Aortic valve: There was mild regurgitation. - Mitral valve: Severely calcified annulus. There was mild   regurgitation. - Left atrium: The atrium was mildly dilated. - Tricuspid valve: There was trivial regurgitation  LHC 04/30/2018   Prox RCA to Mid RCA lesion is 65% stenosed.  Dist RCA-1 lesion is 85% stenosed. Dist RCA-2 lesion is 95% stenosed with 80% stenosed side branch in Post  Atrio.  SVG-rPDA graft was visualized by angiography and is normal in caliber and large. The graft exhibits no disease.  Ost RPDA lesion is 45% stenosed. Ost RPDA to prox RPDA lesion is 90% stenosed - This limits flow retrograde to the RPAV-RPL.  Ost LAD lesion is 55% stenosed.  Ost 1st Diag (small caliber vessel) lesion is 70% stenosed.  Prox LAD lesion is 100% stenosed.  LIMA-LAD graft is large caliber, widely patent perfusing a small caliber distal LAD  Prox Cx to Mid Cx stent is 25% stenosed.  Dist Cx lesion is 65% stenosed. Ost 3rd Mrg to 3rd Mrg lesion is 70% stenosed. -Both limbs of the distal bifurcation.  There is severe left ventricular systolic dysfunction. The left ventricular ejection fraction is less than 25% by visual estimate.  LV end diastolic pressure is moderately elevated.  Significant aortic and mitral valve calcification.  Calcified pericardium     Review of Systems: [y] = yes, [ ]  = no   General: Weight gain [ ] ; Weight loss [ ] ; Anorexia [ ] ; Fatigue [Y ]; Fever [ ] ; Chills [ ] ; Weakness [ ]   Cardiac: Chest pain/pressure [ ] ; Resting SOB [ ] ; Exertional SOB [ ] ; Orthopnea [ ] ; Pedal Edema [ ] ; Palpitations [ ] ; Syncope [ ] ; Presyncope [ ] ; Paroxysmal nocturnal dyspnea[ ]   Pulmonary: Cough [ ] ; Wheezing[ ] ; Hemoptysis[ ] ; Sputum [ ] ; Snoring [ ]   GI: Vomiting[ ] ; Dysphagia[ ] ; Melena[ ] ; Hematochezia [ ] ; Heartburn[ ] ; Abdominal pain [ ] ; Constipation [ ] ; Diarrhea [ ] ;  BRBPR [ ]   GU: Hematuria[ ] ; Dysuria [ ] ; Nocturia[ ]   Vascular: Pain in legs with walking [ ] ; Pain in feet with lying flat [ ] ; Non-healing sores [ ] ; Stroke [ ] ; TIA [ ] ; Slurred speech [ ] ;  Neuro: Headaches[ ] ; Vertigo[ ] ; Seizures[ ] ; Paresthesias[ ] ;Blurred vision [ ] ; Diplopia [ ] ; Vision changes [ ]   Ortho/Skin: Arthritis [ ] ; Joint pain [ ] ; Muscle pain [ ] ; Joint swelling [ ] ; Back Pain [ ] ; Rash [ ]   Psych: Depression[ ] ; Anxiety[ ]   Heme: Bleeding problems [ ] ; Clotting  disorders [ ] ; Anemia [Y ]  Endocrine: Diabetes [ Y]; Thyroid dysfunction[ ]    Past Medical History:  Diagnosis Date  . CAD (coronary artery disease) 09/2007   s/p CABG approximately 2004-[facility] (Cath - 17 Sep 2007:90% prox LAD with diffuse 85% narrowing to distal segment, 80% prox CXA, RCA: prox 90%, prox-mid 70%, mid 85%, distal 80%, D1 80%; Normal EF 65% --status post CABG--LIMA to LAD, SVG to PDA.    [name] COPD (chronic obstructive pulmonary disease) ([place])   . Depression   . Diabetes mellitus without complication ([place])   . Hypertension   . Ischemic cardiomyopathy   . Pleural effusion   . S/P CABG x 2    CABG--LIMA to LAD, SVG to PDA.      Current Outpatient Medications  Medication Sig Dispense Refill  . albuterol (PROVENTIL HFA;VENTOLIN HFA) 108 (90 Base) MCG/ACT inhaler Inhale into the lungs every 6 (six) hours as needed for wheezing or shortness of breath.    [name] ammonium lactate (LAC-HYDRIN) 12 % lotion Apply topically 2 (two) times daily as needed. To feet  5  . atorvastatin (LIPITOR) 20 MG tablet Take 20 mg by mouth daily.  3  . buPROPion (WELLBUTRIN XL) 150 MG 24 hr tablet Take 150 mg by mouth every morning.    . busPIRone (BUSPAR) 15 MG tablet Take 15 mg by mouth every morning.    . carvedilol (COREG) 3.125 MG tablet Take 1 tablet (3.125 mg total) by mouth 2 (two) times daily with a meal. 120 tablet 3  . Cholecalciferol (VITAMIN D3) 5000 units CAPS Take 5,000 Units by mouth 2 (two) times daily.    . clonazePAM (KLONOPIN) 0.5 MG tablet Take 0.5 mg by mouth at bedtime as needed.    . DULoxetine (CYMBALTA) 60 MG capsule Take 60 mg by mouth at bedtime.    . finasteride (PROSCAR) 5 MG tablet Take 5 mg by mouth every evening.   6  . folic acid (FOLVITE) 1 MG tablet Take 1 mg by mouth daily.  2  . furosemide (LASIX) 20 MG tablet Take 20 mg by mouth daily.    [name] glimepiride (AMARYL) 2 MG tablet Take 2 mg by mouth every evening.  1  . metFORMIN (GLUCOPHAGE-XR) 500 MG 24 hr tablet Take 500 mg  by mouth 2 (two) times daily.  1  . methotrexate (RHEUMATREX) 2.5 MG tablet Take 12.5 mg by mouth once a week. On Wednesday  3  . Multiple Vitamins-Minerals (MULTIVITAMIN ADULT PO) Take 1 tablet by mouth every morning.    [name] ofloxacin (OCUFLOX) 0.3 % ophthalmic solution Place 1 drop into both eyes 4 (four) times daily. Prior to procedures.    . pantoprazole (PROTONIX) 40 MG tablet Take 40 mg by mouth 2 (two) times daily.  0  . saccharomyces boulardii ([employer] DIGESTIVE PROBIOTIC) 250 MG capsule Take 250 mg by mouth every morning.    . sacubitril-valsartan (  ENTRESTO) 24-26 MG Take 1 tablet by mouth 2 (two) times daily. 60 tablet 2  . zolpidem (AMBIEN CR) 12.5 MG CR tablet Take 12.5 mg by mouth at bedtime as needed.     No current facility-administered medications for this encounter.     Allergies  Allergen Reactions  . Altace [Ramipril]     Cough       Social History   Socioeconomic History  . Marital status: Legally Separated    Spouse name: Not on file  . Number of children: Not on file  . Years of education: Not on file  . Highest education level: Not on file  Occupational History  . Not on file  Social Needs  . Financial resource strain: Not on file  . Food insecurity:    Worry: Not on file    Inability: Not on file  . Transportation needs:    Medical: Not on file    Non-medical: Not on file  Tobacco Use  . Smoking status: Never Smoker  . Smokeless tobacco: Never Used  Substance and Sexual Activity  . Alcohol use: Yes    Comment: occ  . Drug use: Never  . Sexual activity: Not on file  Lifestyle  . Physical activity:    Days per week: Not on file    Minutes per session: Not on file  . Stress: Not on file  Relationships  . Social connections:    Talks on phone: Not on file    Gets together: Not on file    Attends religious service: Not on file    Active member of club or organization: Not on file    Attends meetings of clubs or organizations: Not on file     Relationship status: Not on file  . Intimate partner violence:    Fear of current or ex partner: Not on file    Emotionally abused: Not on file    Physically abused: Not on file    Forced sexual activity: Not on file  Other Topics Concern  . Not on file  Social History Narrative  . Not on file      Family History  Problem Relation Age of Onset  . Hypertension Father     Vitals:   05/28/18 1002  BP: 124/70  Pulse: 84  SpO2: 98%  Weight: 189 lb (85.7 kg)   Wt Readings from Last 3 Encounters:  05/28/18 189 lb (85.7 kg)  05/07/18 183 lb 9.6 oz (83.3 kg)  05/01/18 177 lb 11.2 oz (80.6 kg)     PHYSICAL EXAM: General:  Well appearing. No respiratory difficulty HEENT: normal anicteric  Neck: supple. no JVD. Carotids 2+ bilat; no bruits. No lymphadenopathy or thryomegaly appreciated. Cor: PMI nondisplaced. Regular rate & rhythm. No rubs, gallops or murmurs. Lungs: clear no wheeze Abdomen: soft, nontender, nondistended. No hepatosplenomegaly. No bruits or masses. Good bowel sounds. Extremities: no cyanosis, clubbing, rash, edema Neuro: alert & oriented x 3, cranial nerves grossly intact. moves all 4 extremities w/o difficulty. Affect pleasant   ECG: SR 84 BPM with a single PVC Personally reviewed     ASSESSMENT & PLAN:  1. Chronic Systolic Heart Failure EF has dropped from 45-50% in January of this year to 35-40%.  ECHO 04/2018 EF 35-40%. ?Viral versus PVC NYHA II. Volume status ok. Continue lasix 20 mg daily  -Continue low dose carvedilol  -Increase entresto to 49-51 mg twice day. - Next visit consider spiro  - Check BMET  -Plan to repeat ECHO  3-4 months  2. CAD S/P CABG  LHC 04/30/2018 severe multivessel disease and patent LIMA-LAD SVG-PDA No s/s ischemia  Continue statin, bb and will need to start 81 mg aspirin.   3. Anemia Check CBC and anemia panel today   4. DMII  hgb A1C 6.8  Per PCP  5. PVCs Occasional on EKG. May need monitor. Can repeat EKG next  visit.   Follow up in 2 weeks with Dr Haroldine Laws  Darrick Grinder NP-C  4:27 PM  Patient seen and examined with Darrick Grinder, NP. We discussed all aspects of the encounter. I agree with the assessment and plan as stated above.   72 y/o male as above with CAD and previous CABG. Admitted with HF symptoms in 5/19 and EF found to have dropped from 55% to 35-40%. Cath with stable CAD. Diuresed well. Exam unremarkable. No JVD or S3. Occasional PVC.   Now feeling better NYHA II but under a lot of stress with his daughter's illness. Etiology of drop in LV function unclear. Has occasional PVCs but not sure burden is high enough to cause a problem. Will order 48 hour holter. May also need cMRI. Agree with increasing Entresto. Can eventually consider Jardiance as well. We will follow in the HF Clinic.   Glori Bickers, MD  11:42 PM

## 2018-05-29 ENCOUNTER — Ambulatory Visit: Payer: BLUE CROSS/BLUE SHIELD | Admitting: Family Medicine

## 2018-05-29 ENCOUNTER — Encounter: Payer: Self-pay | Admitting: Family Medicine

## 2018-05-29 VITALS — BP 118/68 | HR 89 | Temp 97.8°F | Ht 70.0 in | Wt 191.0 lb

## 2018-05-29 DIAGNOSIS — E785 Hyperlipidemia, unspecified: Secondary | ICD-10-CM

## 2018-05-29 DIAGNOSIS — E119 Type 2 diabetes mellitus without complications: Secondary | ICD-10-CM | POA: Diagnosis not present

## 2018-05-29 NOTE — Progress Notes (Signed)
Pre visit review using our clinic review tool, if applicable. No additional management support is needed unless otherwise documented below in the visit note. 

## 2018-05-29 NOTE — Patient Instructions (Signed)
Aim to do some physical exertion for 150 minutes per week. This is typically divided into 5 days per week, 30 minutes per day. The activity should be enough to get your heart rate up. Anything is better than nothing if you have time constraints.  Mind the salt intake and keep the diet clean.   We will see if you need to get any immunizations from your records.  Let us know if you need anything.

## 2018-05-29 NOTE — Progress Notes (Signed)
Chief Complaint  Patient presents with  . New Patient (Initial Visit)       New Patient Visit SUBJECTIVE: HPI: Alec Taylor is an 72 y.o.male who is being seen for establishing care.  The patient was previously seen at Dr. Clair Gulling office- currently serving 1 year off due to noncompete?  Patient has well-controlled diabetes.  He was diagnosed with pneumonia and hospitalized within the past year.  His breathing did not return to normal and he was subsequently hospitalized again due to acute respiratory failure.  He was diagnosed with congestive heart failure.  He is currently seeing the cardiology team and his medications are being titrated.  He does not need any refills of any of his medications today.  He does not have any issues.  His diet is improving.  His exercise is minimal.  Unsure about immunization history.  Allergies  Allergen Reactions  . Altace [Ramipril]     Cough     Past Medical History:  Diagnosis Date  . CAD (coronary artery disease) 09/2007   s/p CABG approximately 2004-[facility] (Cath - 17 Sep 2007:90% prox LAD with diffuse 85% narrowing to distal segment, 80% prox CXA, RCA: prox 90%, prox-mid 70%, mid 85%, distal 80%, D1 80%; Normal EF 65% --status post CABG--LIMA to LAD, SVG to PDA.    [name] COPD (chronic obstructive pulmonary disease) ([facility])   . Depression   . Diabetes mellitus without complication ([facility])   . Hypertension   . Ischemic cardiomyopathy   . Pleural effusion   . S/P CABG x 2    CABG--LIMA to LAD, SVG to PDA.     Past Surgical History:  Procedure Laterality Date  . APPENDECTOMY    . CHOLECYSTECTOMY    . CORONARY ARTERY BYPASS GRAFT    . GASTRIC BYPASS    . LEFT HEART CATH AND CORS/GRAFTS ANGIOGRAPHY N/A 04/30/2018   Procedure: LEFT HEART CATH AND CORS/GRAFTS ANGIOGRAPHY;  Surgeon: Marykay Lex, MD;  Location: [facility] INVASIVE CV LAB;  Service: Cardiovascular;  Laterality: N/A;   Family History  Problem Relation Age of Onset  . Hypertension Father     Allergies  Allergen Reactions  . Altace [Ramipril]     Cough    ROS Cardiovascular: Denies chest pain  Respiratory: Denies dyspnea   OBJECTIVE: BP 118/68 (BP Location: Left Arm, Patient Position: Sitting, Cuff Size: Normal)   Pulse 89   Temp 97.8 F (36.6 C) (Oral)   Ht 5\' 10"  (1.778 m)   Wt 191 lb (86.6 kg)   SpO2 97%   BMI 27.41 kg/m   Constitutional: -  VS reviewed -  Well developed, well nourished, appears stated age -  No apparent distress  Psychiatric: -  Oriented to person, place, and time -  Memory intact -  Affect and mood normal -  Fluent conversation, good eye contact -  Judgment and insight age appropriate  Eye: -  Conjunctivae clear, no discharge -  Pupils symmetric, round, reactive to light  ENMT: -  MMM    Pharynx moist, no exudate, no erythema  Neck: -  No gross swelling, no palpable masses -  Thyroid midline, not enlarged, mobile, no palpable masses  Cardiovascular: -  RRR -  No LE edema  Respiratory: -  Normal respiratory effort, no accessory muscle use, no retraction -  Breath sounds equal, no wheezes, no ronchi, no crackles  Musculoskeletal: -  No clubbing, no cyanosis -  Gait normal  Skin: -  No significant lesion on inspection -  Warm and dry to palpation   ASSESSMENT/PLAN: Hyperlipidemia LDL goal <70  Diabetes mellitus type 2 in nonobese [facility])  Patient instructed to sign release of records form from his previous PCP. He is well controlled I will not change his meds. Patient should return in 5 mo for DM visit or prn. The patient voiced understanding and agreement to the plan.   Jilda Roche [place], DO 05/29/18  2:36 PM

## 2018-05-30 ENCOUNTER — Other Ambulatory Visit (HOSPITAL_COMMUNITY): Payer: Self-pay | Admitting: *Deleted

## 2018-05-30 ENCOUNTER — Ambulatory Visit: Payer: BLUE CROSS/BLUE SHIELD | Admitting: Cardiology

## 2018-05-30 ENCOUNTER — Telehealth (HOSPITAL_COMMUNITY): Payer: Self-pay | Admitting: *Deleted

## 2018-05-30 DIAGNOSIS — D508 Other iron deficiency anemias: Secondary | ICD-10-CM

## 2018-05-30 MED ORDER — FERUMOXYTOL INJECTION 510 MG/17 ML
510.0000 mg | INTRAVENOUS | Status: DC
Start: 2018-06-04 — End: 2018-05-30

## 2018-05-30 NOTE — Telephone Encounter (Signed)
Result Notes for Folate   Notes recorded by Georgina Peer, RN on 05/30/2018 at 2:27 PM EDT Called patient and he is agreeable to infusion. Scheduled for June 26th @ 10:00. Patient is aware to go to the Main Entrance of [facility] at 9:45 AM. Orders placed. ------  Notes recorded by Sherald Hess, NP on 05/30/2018 at 10:27 AM EDT Iron stores low. Set up for feraheme infusion. Please call.

## 2018-06-03 ENCOUNTER — Other Ambulatory Visit (HOSPITAL_COMMUNITY): Payer: Self-pay

## 2018-06-03 DIAGNOSIS — D649 Anemia, unspecified: Secondary | ICD-10-CM

## 2018-06-03 MED ORDER — SODIUM CHLORIDE 0.9 % IV SOLN: 510.0000 mg | INTRAVENOUS | Status: DC

## 2018-06-04 ENCOUNTER — Ambulatory Visit (HOSPITAL_COMMUNITY)
Admission: RE | Admit: 2018-06-04 | Discharge: 2018-06-04 | Disposition: A | Discharge status: Home / Self Care | Payer: BLUE CROSS/BLUE SHIELD | Source: Ambulatory Visit | Attending: Adult Health | Admitting: Adult Health

## 2018-06-04 DIAGNOSIS — D509 Iron deficiency anemia, unspecified: Secondary | ICD-10-CM | POA: Insufficient documentation

## 2018-06-04 MED ORDER — FERUMOXYTOL INJECTION 510 MG/17 ML
510.0000 mg | Freq: Once | INTRAVENOUS | Status: AC
  Administered 2018-06-04: 510 mg via INTRAVENOUS
  Filled 2018-06-04: qty 17

## 2018-06-05 DIAGNOSIS — E113513 Type 2 diabetes mellitus with proliferative diabetic retinopathy with macular edema, bilateral: Secondary | ICD-10-CM | POA: Diagnosis not present

## 2018-06-05 DIAGNOSIS — E113512 Type 2 diabetes mellitus with proliferative diabetic retinopathy with macular edema, left eye: Secondary | ICD-10-CM | POA: Diagnosis not present

## 2018-06-05 DIAGNOSIS — H35373 Puckering of macula, bilateral: Secondary | ICD-10-CM | POA: Diagnosis not present

## 2018-06-05 DIAGNOSIS — H35353 Cystoid macular degeneration, bilateral: Secondary | ICD-10-CM | POA: Diagnosis not present

## 2018-06-10 ENCOUNTER — Ambulatory Visit: Payer: BLUE CROSS/BLUE SHIELD | Admitting: Cardiology

## 2018-06-13 ENCOUNTER — Encounter (HOSPITAL_COMMUNITY): Payer: Self-pay | Admitting: Internal Medicine

## 2018-06-13 ENCOUNTER — Ambulatory Visit (HOSPITAL_COMMUNITY)
Admission: RE | Admit: 2018-06-13 | Discharge: 2018-06-13 | Disposition: A | Discharge status: Home / Self Care | Payer: Medicare Other | Source: Ambulatory Visit | Attending: Internal Medicine | Admitting: Internal Medicine

## 2018-06-13 VITALS — BP 90/60 | HR 84 | Wt 188.4 lb

## 2018-06-13 DIAGNOSIS — Z79899 Other long term (current) drug therapy: Secondary | ICD-10-CM | POA: Diagnosis not present

## 2018-06-13 DIAGNOSIS — J449 Chronic obstructive pulmonary disease, unspecified: Secondary | ICD-10-CM | POA: Diagnosis not present

## 2018-06-13 DIAGNOSIS — D649 Anemia, unspecified: Secondary | ICD-10-CM | POA: Diagnosis not present

## 2018-06-13 DIAGNOSIS — Z9884 Bariatric surgery status: Secondary | ICD-10-CM | POA: Insufficient documentation

## 2018-06-13 DIAGNOSIS — I9589 Other hypotension: Secondary | ICD-10-CM | POA: Diagnosis not present

## 2018-06-13 DIAGNOSIS — I255 Ischemic cardiomyopathy: Secondary | ICD-10-CM | POA: Diagnosis not present

## 2018-06-13 DIAGNOSIS — I2510 Atherosclerotic heart disease of native coronary artery without angina pectoris: Secondary | ICD-10-CM | POA: Diagnosis not present

## 2018-06-13 DIAGNOSIS — E119 Type 2 diabetes mellitus without complications: Secondary | ICD-10-CM

## 2018-06-13 DIAGNOSIS — Z951 Presence of aortocoronary bypass graft: Secondary | ICD-10-CM | POA: Insufficient documentation

## 2018-06-13 DIAGNOSIS — I110 Hypertensive heart disease with heart failure: Secondary | ICD-10-CM | POA: Diagnosis not present

## 2018-06-13 DIAGNOSIS — I5021 Acute systolic (congestive) heart failure: Secondary | ICD-10-CM

## 2018-06-13 DIAGNOSIS — E785 Hyperlipidemia, unspecified: Secondary | ICD-10-CM

## 2018-06-13 DIAGNOSIS — Z7984 Long term (current) use of oral hypoglycemic drugs: Secondary | ICD-10-CM | POA: Insufficient documentation

## 2018-06-13 DIAGNOSIS — F329 Major depressive disorder, single episode, unspecified: Secondary | ICD-10-CM | POA: Insufficient documentation

## 2018-06-13 DIAGNOSIS — I10 Essential (primary) hypertension: Secondary | ICD-10-CM

## 2018-06-13 DIAGNOSIS — E861 Hypovolemia: Secondary | ICD-10-CM | POA: Diagnosis not present

## 2018-06-13 DIAGNOSIS — E669 Obesity, unspecified: Secondary | ICD-10-CM | POA: Diagnosis not present

## 2018-06-13 DIAGNOSIS — I5022 Chronic systolic (congestive) heart failure: Secondary | ICD-10-CM

## 2018-06-13 NOTE — Progress Notes (Addendum)
Advanced Heart Failure Clinic Note   Referring Physician: Dr Excell Seltzer Primary Care: Dr Derrell Lolling [facility] Cardiologist: Dr Beverely Pace   HPI:  Mr Stenner was referred by Dr Excell Seltzer for heart failure management,.   Randol Leinonen is a 72 y.o. male  with history of CAD s/p CABG 2004 at [facility], HTN, hyperlipidemia, DMII, obesity s/p gastric bypass and newly diagnosed heart failure.   Echocardiogram1/2019 during his last hospital admission revealed LVEF of 45 to 50%.   Recently diagnosed with acute systolic heart failure. He saw his PCP on 04/18/2018 for SOB. Patient was started on albuterol, PFTs were ordered and patient was to get CXR.[name]   Admitted 04/28/18 with increased dyspnea. CTA negative for PE. ECHO performed and showed reduced EF 35-40%. He underwent LHC . Stable coronary disease. Diuresed well. Discharge weight was 177 pounds.   He presents today for regular follow up. Last visit entresto increased. Has been doing well uatil last day or two when he became lightheaded and BP dropped.  Weight down 1 lb. BP at home this am 95/45. He has been lightheaded and feels like he is slurring his speech slightly. No focal neuro symptoms. This was after he had taken his medicine. Denies SOB, but not very active. Denies orthopnea or bendopnea. Denies CP. Appetite has been OK. No fevers or chills. Weight at home 180-182 lbs. Taking all medications as directed. Had to take a [place], but otherwise gets around and does what he needs to do.Has been under a lot of stress as granddaughter just discharged from [facility] after having brain tumor removed   ECHO 04/29/2018  Left ventricle: The cavity size was normal. Wall thickness was   normal. Systolic function was moderately reduced. The estimated   ejection fraction was in the range of 35% to 40%. Diffuse   hypokinesis. The study is not technically sufficient to allow   evaluation of LV diastolic function. - Aortic valve: There was  mild regurgitation. - Mitral valve: Severely calcified annulus. There was mild   regurgitation. - Left atrium: The atrium was mildly dilated. - Tricuspid valve: There was trivial regurgitation  LHC 04/30/2018   Prox RCA to Mid RCA lesion is 65% stenosed.  Dist RCA-1 lesion is 85% stenosed. Dist RCA-2 lesion is 95% stenosed with 80% stenosed side branch in Post Atrio.  SVG-rPDA graft was visualized by angiography and is normal in caliber and large. The graft exhibits no disease.  Ost RPDA lesion is 45% stenosed. Ost RPDA to prox RPDA lesion is 90% stenosed - This limits flow retrograde to the RPAV-RPL.  Ost LAD lesion is 55% stenosed.  Ost 1st Diag (small caliber vessel) lesion is 70% stenosed.  Prox LAD lesion is 100% stenosed.  LIMA-LAD graft is large caliber, widely patent perfusing a small caliber distal LAD  Prox Cx to Mid Cx stent is 25% stenosed.  Dist Cx lesion is 65% stenosed. Ost 3rd Mrg to 3rd Mrg lesion is 70% stenosed. -Both limbs of the distal bifurcation.  There is severe left ventricular systolic dysfunction. The left ventricular ejection fraction is less than 25% by visual estimate.  LV end diastolic pressure is moderately elevated.  Significant aortic and mitral valve calcification.  Calcified pericardium  Review of systems complete and found to be negative unless listed in HPI.    Past Medical History:  Diagnosis Date  . CAD (coronary artery disease) 09/2007   s/p CABG approximately 2004-[facility] (Cath - 17 Sep 2007:90% prox LAD  with diffuse 85% narrowing to distal segment, 80% prox CXA, RCA: prox 90%, prox-mid 70%, mid 85%, distal 80%, D1 80%; Normal EF 65% --status post CABG--LIMA to LAD, SVG to PDA.    [name] COPD (chronic obstructive pulmonary disease) (HCC)   . Depression   . Diabetes mellitus without complication (HCC)   . Hypertension   . Ischemic cardiomyopathy   . Pleural effusion   . S/P CABG x 2    CABG--LIMA to LAD, SVG to PDA.     Current  Outpatient Medications  Medication Sig Dispense Refill  . albuterol (PROVENTIL HFA;VENTOLIN HFA) 108 (90 Base) MCG/ACT inhaler Inhale into the lungs every 6 (six) hours as needed for wheezing or shortness of breath.    [name] ammonium lactate (LAC-HYDRIN) 12 % lotion Apply topically 2 (two) times daily as needed. To feet  5  . atorvastatin (LIPITOR) 20 MG tablet Take 20 mg by mouth daily.  3  . buPROPion (WELLBUTRIN XL) 150 MG 24 hr tablet Take 150 mg by mouth every morning.    . busPIRone (BUSPAR) 15 MG tablet Take 15 mg by mouth every morning.    . carvedilol (COREG) 3.125 MG tablet Take 1 tablet (3.125 mg total) by mouth 2 (two) times daily with a meal. 120 tablet 3  . Cholecalciferol (VITAMIN D3) 5000 units CAPS Take 5,000 Units by mouth 2 (two) times daily.    . clonazePAM (KLONOPIN) 0.5 MG tablet Take 0.5 mg by mouth at bedtime as needed.    . DULoxetine (CYMBALTA) 60 MG capsule Take 60 mg by mouth at bedtime.    . finasteride (PROSCAR) 5 MG tablet Take 5 mg by mouth every evening.   6  . folic acid (FOLVITE) 1 MG tablet Take 1 mg by mouth daily.  2  . furosemide (LASIX) 20 MG tablet Take 20 mg by mouth daily.    [name] glimepiride (AMARYL) 2 MG tablet Take 2 mg by mouth every evening.  1  . metFORMIN (GLUCOPHAGE-XR) 500 MG 24 hr tablet Take 500 mg by mouth 2 (two) times daily.  1  . methotrexate (RHEUMATREX) 2.5 MG tablet Take 12.5 mg by mouth once a week. On Wednesday  3  . Multiple Vitamins-Minerals (MULTIVITAMIN ADULT PO) Take 1 tablet by mouth every morning.    [name] ofloxacin (OCUFLOX) 0.3 % ophthalmic solution Place 1 drop into both eyes 4 (four) times daily. Prior to procedures.    . pantoprazole (PROTONIX) 40 MG tablet Take 40 mg by mouth 2 (two) times daily.  0  . saccharomyces boulardii ([employer] DIGESTIVE PROBIOTIC) 250 MG capsule Take 250 mg by mouth every morning.    . sacubitril-valsartan (ENTRESTO) 49-51 MG Take 1 tablet by mouth 2 (two) times daily. 60 tablet 11  . zolpidem (AMBIEN CR) 12.5  MG CR tablet Take 12.5 mg by mouth at bedtime as needed.     No current facility-administered medications for this encounter.    Allergies  Allergen Reactions  . Altace [Ramipril]     Cough    Social History   Socioeconomic History  . Marital status: Legally Separated    Spouse name: Not on file  . Number of children: Not on file  . Years of education: Not on file  . Highest education level: Not on file  Occupational History  . Not on file  Social Needs  . Financial resource strain: Not on file  . Food insecurity:    Worry: Not on file    Inability: Not on  file  . Transportation needs:    Medical: Not on file    Non-medical: Not on file  Tobacco Use  . Smoking status: Never Smoker  . Smokeless tobacco: Never Used  Substance and Sexual Activity  . Alcohol use: Yes    Comment: occ  . Drug use: Never  . Sexual activity: Not on file  Lifestyle  . Physical activity:    Days per week: Not on file    Minutes per session: Not on file  . Stress: Not on file  Relationships  . Social connections:    Talks on phone: Not on file    Gets together: Not on file    Attends religious service: Not on file    Active member of club or organization: Not on file    Attends meetings of clubs or organizations: Not on file    Relationship status: Not on file  . Intimate partner violence:    Fear of current or ex partner: Not on file    Emotionally abused: Not on file    Physically abused: Not on file    Forced sexual activity: Not on file  Other Topics Concern  . Not on file  Social History Narrative  . Not on file    Family History  Problem Relation Age of Onset  . Hypertension Father    Vitals:   06/13/18 1321 06/13/18 1335 06/13/18 1337  BP: (!) 98/56 (!) 98/58 90/60  Pulse: 84    SpO2: 95%    Weight: 188 lb 6.4 oz (85.5 kg)       Wt Readings from Last 3 Encounters:  06/13/18 188 lb 6.4 oz (85.5 kg)  05/29/18 191 lb (86.6 kg)  05/28/18 189 lb (85.7 kg)       PHYSICAL EXAM: General: Well appearing. No resp difficulty. HEENT: Normal anicteric Neck: Supple. JVP 6-7. Carotids 2+ bilat; no bruits. No thyromegaly or nodule noted. Cor: PMI nondisplaced. RRR, No M/G/R noted Lungs: CTAB, normal effort. Abdomen: Soft, non-tender, non-distended, no HSM. No bruits or masses. +BS  Extremities: no cyanosis, clubbing, rash, edema  BLE with varicose veins.  Neuro: alert & oriented x 3, cranial nerves grossly intact. moves all 4 extremities w/o difficulty. Affect pleasant   ECG: NSR 93 bpm, personally reviewed   ASSESSMENT & PLAN:  1. Chronic Systolic Heart Failure EF has dropped from 45-50% in January of this year to 35-40%.  ECHO 04/2018 EF 35-40%. ?Viral versus PVC - NYHA III symptoms.  - Volume status stable to mildly dry on exam.   - With increased Entresto, will stop lasix, and only take as needed.  - Continue Coreg 3.125 mg BID.  - Continue Entresto 49-51 mg BID. May need to cut back further. Pt to call with continued symptoms/hypotension. - No room for spiro today.  - Check BMET  - Plan to repeat ECHO around September.   2. CAD S/P CABG  - No s/s of ischemia.    - LHC 04/30/2018 severe multivessel disease and patent LIMA-LAD SVG-PDA - Continue statin, bb and will need to start 81 mg aspirin.   3. Anemia - Iron stores low 05/28/18. Has received feraheme.   4. DMII  - hgb A1C 6.8  - Per PCP.   5. PVCs - Occasional on previous EKG - None noted on todays EKG, but ? Multiple p-wave morphologies. - Have discussed holter monitor  Graciella Freer, PA-C  1:41 PM   Patient seen and examined with the above-signed Advanced Practice  Provider and/or Housestaff. I personally reviewed laboratory data, imaging studies and relevant notes. I independently examined the patient and formulated the important aspects of the plan. I have edited the note to reflect any of my changes or salient points. I have personally discussed the plan with the  patient and/or family.  Overall doing very well. EF just mildly reduced from baseline. Cath with stable revascularization. Had occasional PVCs but probably not enough to explain drop in EF. NYHA II. Volume status appears low. Stop lasix. Check labs. Drink extra fluid today. Continue Entresto at [phone]. Can drop back as needed. Will see back in 6 weeks with cMRI to further evaluate.   Arvilla Meres, MD  7:12 PM

## 2018-06-13 NOTE — Patient Instructions (Signed)
Stop Furosemide  If BP does not improved please call us  Your physician has requested that you have a cardiac MRI. Cardiac MRI uses a computer to create images of your heart as its beating, producing both still and moving pictures of your heart and major blood vessels. For further information please visit [email or web address]. Please follow the instruction sheet given to you today for more information.  ONCE THIS IS APPROVED WITH INSURANCE WE WILL CALL YOU TO SCHEDULE  Please follow up with Cicero Duck, our heart failure pharmacist in 2 weeks  Your physician recommends that you schedule a follow-up appointment in: 6-8 weeks

## 2018-06-25 ENCOUNTER — Ambulatory Visit (HOSPITAL_COMMUNITY)
Admission: RE | Admit: 2018-06-25 | Discharge: 2018-06-25 | Disposition: A | Discharge status: Home / Self Care | Payer: BLUE CROSS/BLUE SHIELD | Source: Ambulatory Visit | Attending: Cardiology | Admitting: Cardiology

## 2018-06-25 VITALS — BP 112/60 | HR 70 | Wt 187.8 lb

## 2018-06-25 DIAGNOSIS — Z09 Encounter for follow-up examination after completed treatment for conditions other than malignant neoplasm: Secondary | ICD-10-CM | POA: Insufficient documentation

## 2018-06-25 DIAGNOSIS — I493 Ventricular premature depolarization: Secondary | ICD-10-CM | POA: Diagnosis not present

## 2018-06-25 DIAGNOSIS — E6609 Other obesity due to excess calories: Secondary | ICD-10-CM | POA: Diagnosis not present

## 2018-06-25 DIAGNOSIS — Z9884 Bariatric surgery status: Secondary | ICD-10-CM | POA: Diagnosis not present

## 2018-06-25 DIAGNOSIS — I10 Essential (primary) hypertension: Secondary | ICD-10-CM | POA: Insufficient documentation

## 2018-06-25 DIAGNOSIS — E785 Hyperlipidemia, unspecified: Secondary | ICD-10-CM | POA: Insufficient documentation

## 2018-06-25 DIAGNOSIS — I2510 Atherosclerotic heart disease of native coronary artery without angina pectoris: Secondary | ICD-10-CM | POA: Insufficient documentation

## 2018-06-25 DIAGNOSIS — I5022 Chronic systolic (congestive) heart failure: Secondary | ICD-10-CM | POA: Diagnosis not present

## 2018-06-25 DIAGNOSIS — D649 Anemia, unspecified: Secondary | ICD-10-CM | POA: Insufficient documentation

## 2018-06-25 DIAGNOSIS — Z951 Presence of aortocoronary bypass graft: Secondary | ICD-10-CM | POA: Diagnosis not present

## 2018-06-25 DIAGNOSIS — E119 Type 2 diabetes mellitus without complications: Secondary | ICD-10-CM | POA: Insufficient documentation

## 2018-06-25 LAB — BASIC METABOLIC PANEL
ANION GAP: 5 (ref 5–15)
BUN: 30 mg/dL — ABNORMAL HIGH (ref 8–23)
CALCIUM: 9.2 mg/dL (ref 8.9–10.3)
CHLORIDE: 106 mmol/L (ref 98–111)
CO2: 30 mmol/L (ref 22–32)
Creatinine, Ser: 0.99 mg/dL (ref 0.61–1.24)
GFR calc non Af Amer: >60 mL/min (ref >60)
Glucose, Bld: 74 mg/dL (ref 70–99)
Potassium: 4.7 mmol/L (ref 3.5–5.1)
SODIUM: 141 mmol/L (ref 135–145)

## 2018-06-25 MED ORDER — SPIRONOLACTONE 25 MG PO TABS: 12.5000 mg | ORAL_TABLET | Freq: Every day | ORAL | 3 refills | Status: DC

## 2018-06-25 NOTE — Patient Instructions (Addendum)
Start Spironolactone 125.mg = 1/2 tab daily   Go to lab for BMET next week and fax results to [phone]  Follow Up with Dr. [name] Aug 15 at 9:40

## 2018-06-25 NOTE — Progress Notes (Signed)
Referring Physician: Dr Excell Seltzer Primary Care: Dr Derrell Lolling [facility] Cardiologist: Dr Beverely Pace  HF MD: Dr. [name]  HPI:   Mr Alec Taylor was referred by Dr Excell Seltzer for heart failure management,.   Alec Taylor is a 72 y.o. male  with history of CAD s/p CABG 2004 at [facility], HTN, hyperlipidemia, DMII, obesity s/p gastric bypass and newly diagnosed heart failure.   Echocardiogram1/2019 during his last hospital admission revealed LVEF of 45 to 50%.   Recently diagnosed with acute systolic heart failure. He saw his PCP on 04/18/2018 for SOB. Patient was started on albuterol, PFTs were ordered and patient was to get CXR.[name]   Admitted 04/28/18 with increased dyspnea. CTA negative for PE. ECHO performed and showed reduced EF 35-40%. He underwent LHC . Stable coronary disease. Diuresed well. Discharge weight was 177 pounds.   He was seen for HF clinic follow up and did well most days but did c/o some lightheadedness/dizziness and BP dropped - at this visit his furosemide was stopped.    He presents to initial pharmacy medication titration clinic today stating compliance with medications.  He has not had any dizziness or lightheadedness since last visit. Weight down 1lb since last visit. Weight at home stable 180-182 lbs. His diet is mostly low fat and low salt with occasional fried chicken or [employer] burger.  He has been under a lot of stress as granddaughter just discharged from [facility] after having brain tumor removed.      . Shortness of breath/dyspnea on exertion? no  . Orthopnea/PND? no . Edema? no . Lightheadedness/dizziness? no . Daily weights at home? yes . Blood pressure/heart rate monitoring at home? yes . Following low-sodium/fluid-restricted diet? no  HF Medications: Carvedilol 3.125mg  BID Entresto 49-51mg  BID  Has the patient been experiencing any side effects to the medications prescribed?  no  Does the patient have any problems obtaining medications due to transportation or  finances?   yes  Understanding of regimen: good Understanding of indications: good Potential of compliance: good Patient understands to avoid NSAIDs. Patient understands to avoid decongestants.    Pertinent Lab Values: . Serum creatinine 0.99 (stable), CO2 29, Potassium 4.7 (stable), Sodium 144  Vital Signs: . Weight: 187lb (dry weight: 182lb- on home scale - stable) . Blood pressure: 112/60 . Heart rate: 70  . O2 96%  Assessment: 1. Chronicsystolic CHF (EF 83%), NYHA class II symptoms - though not extremely active. -volume status stable - no LE edema, weight stable, no abdominal distension  -BP previously soft now improved, HR stable 70 - will not increase carvedilol and concern with low BP at last visit that increasing Entresto would drop BP too much.  Will add spironolactone 12.5mg  Daily and increase as tolerated. -Cr and K stable  Continue other current HF medications  Carvedilol 3.125mg  BID Entresto 49-51mg  BID - Basic disease state pathophysiology, medication indication, mechanism and side effects reviewed at length with patient and he verbalized understanding  2. CAD S/P CABG  - No s/s of ischemia.    - LHC 04/30/2018 severe multivessel disease and patent LIMA-LAD SVG-PDA - Continue statin, bb and aspirin 81mg   3. Anemia - Iron stores low 05/28/18. Has received feraheme. Can recheck at next visit too soon to check today   4. DMII  - hgb A1C 6.8  - Per PCP.   5. PVCs - Occasional on previous EKG - None noted on last EKG, but ? Multiple p-wave morphologies. - Have discussed holter monitor     Plan: 1)  Medication changes: Based on clinical presentation volume status stable - remains off furosemide  - vital signs BP improved, HR stable-  Will add spironolactone 12.5mg  Daily and increase as tolerated. -labs Cr and K stable  Continue other current HF medications  Carvedilol 3.125mg  BID Entresto 49-51mg  BID  2) Labs: BMET in 1 week and fax to HF clinic -  he refused to come back to our lab - has lab near his home in [place], RX given 3) Follow-up: Dr. [name] 07/24/18  Leota Sauers Pharm.D. CPP, BCPS Clinical Pharmacist [phone] 06/27/2018 12:31 PM

## 2018-06-27 NOTE — Progress Notes (Signed)
Referring Physician: Dr Excell Seltzer Primary Care: Dr Derrell Lolling [facility] Cardiologist: Dr Beverely Pace  HF MD: Dr. [name]  HPI:   [name] was referred by Dr Excell Seltzer for heart failure management,.   Alec Taylor is a 72 y.o. male  with history of CAD s/p CABG 2004 at [facility], HTN, hyperlipidemia, DMII, obesity s/p gastric bypass and newly diagnosed heart failure.   Echocardiogram1/2019 during his last hospital admission revealed LVEF of 45 to 50%.   Recently diagnosed with acute systolic heart failure. He saw his PCP on 04/18/2018 for SOB. Patient was started on albuterol, PFTs were ordered and patient was to get CXR.[name]   Admitted 04/28/18 with increased dyspnea. CTA negative for PE. ECHO performed and showed reduced EF 35-40%. He underwent LHC . Stable coronary disease. Diuresed well. Discharge weight was 177 pounds.   He was seen for HF clinic follow up and did well most days but did c/o some lightheadedness/dizziness and BP dropped - at this visit his furosemide was stopped.    He presents to initial pharmacy medication titration clinic today stating compliance with medications.  He has not had any dizziness or lightheadedness since last visit. Weight down 1lb since last visit. Weight at home stable 180-182 lbs. His diet is mostly low fat and low salt with occasional fried chicken or McDonald[employer] burger.  He has been under a lot of stress as granddaughter just discharged from [facility] after having brain tumor removed.      . Shortness of breath/dyspnea on exertion? no  . Orthopnea/PND? no . Edema? no . Lightheadedness/dizziness? no . Daily weights at home? yes . Blood pressure/heart rate monitoring at home? yes . Following low-sodium/fluid-restricted diet? no  HF Medications: Carvedilol 3.125mg  BID Entresto 49-51mg  BID  Has the patient been experiencing any side effects to the medications prescribed?  no  Does the patient have any problems obtaining medications due to transportation or  finances?   yes  Understanding of regimen: good Understanding of indications: good Potential of compliance: good Patient understands to avoid NSAIDs. Patient understands to avoid decongestants.    Pertinent Lab Values: . Serum creatinine 0.99 (stable), CO2 29, Potassium 4.7 (stable), Sodium 144  Vital Signs: . Weight: 187lb (dry weight: 182lb- on home scale - stable) . Blood pressure: 112/60 . Heart rate: 70  . O2 96%  Assessment: 1. Chronicsystolic CHF (EF 83%), NYHA class II symptoms - though not extremely active. -volume status stable - no LE edema, weight stable, no abdominal distension  -BP previously soft now improved, HR stable 70 - will not increase carvedilol and concern with low BP at last visit that increasing Entresto would drop BP too much.  Will add spironolactone 12.5mg  Daily and increase as tolerated. -Cr and K stable  Continue other current HF medications  Carvedilol 3.125mg  BID Entresto 49-51mg  BID - Basic disease state pathophysiology, medication indication, mechanism and side effects reviewed at length with patient and he verbalized understanding  2. CAD S/P CABG  - No s/s of ischemia.    - LHC 04/30/2018 severe multivessel disease and patent LIMA-LAD SVG-PDA - Continue statin, bb and aspirin 81mg   3. Anemia - Iron stores low 05/28/18. Has received feraheme. Can recheck at next visit too soon to check today   4. DMII  - hgb A1C 6.8  - Per PCP.   5. PVCs - Occasional on previous EKG - None noted on last EKG, but ? Multiple p-wave morphologies. - Have discussed holter monitor     Plan: 1)  Medication changes: Based on clinical presentation volume status stable - remains off furosemide  - vital signs BP improved, HR stable-  Will add spironolactone 12.5mg  Daily and increase as tolerated. -labs Cr and K stable  Continue other current HF medications  Carvedilol 3.125mg  BID Entresto 49-51mg  BID  2) Labs: BMET in 1 week and fax to HF clinic -  he refused to come back to our lab - has lab near his home in [place], RX given 3) Follow-up: Dr. [name] 07/24/18  Leota Sauers Pharm.D. CPP, BCPS Clinical Pharmacist [phone] 06/27/2018 12:33 PM

## 2018-07-03 DIAGNOSIS — S90412A Abrasion, left great toe, initial encounter: Secondary | ICD-10-CM | POA: Insufficient documentation

## 2018-07-08 ENCOUNTER — Other Ambulatory Visit (HOSPITAL_COMMUNITY): Payer: Self-pay | Admitting: Student

## 2018-07-08 DIAGNOSIS — I509 Heart failure, unspecified: Secondary | ICD-10-CM | POA: Diagnosis not present

## 2018-07-09 LAB — SPECIMEN STATUS REPORT

## 2018-07-11 LAB — BASIC METABOLIC PANEL
BUN / CREAT RATIO: 27 — AB (ref 10–24)
BUN: 27 mg/dL (ref 8–27)
CHLORIDE: 102 mmol/L (ref 96–106)
CO2: 24 mmol/L (ref 20–29)
Calcium: 9.2 mg/dL (ref 8.6–10.2)
Creatinine, Ser: 1 mg/dL (ref 0.76–1.27)
GFR calc non Af Amer: 76 mL/min/{1.73_m2} (ref >59)
GFR, EST AFRICAN AMERICAN: 88 mL/min/{1.73_m2} (ref >59)
Glucose: 261 mg/dL — ABNORMAL HIGH (ref 65–99)
Potassium: 4.8 mmol/L (ref 3.5–5.2)
SODIUM: 140 mmol/L (ref 134–144)

## 2018-07-15 ENCOUNTER — Telehealth: Payer: Self-pay | Admitting: Internal Medicine

## 2018-07-15 NOTE — Telephone Encounter (Signed)
Called patient and LVM to call and let me know what time he would like his cardiac MRI scheduled.

## 2018-07-17 ENCOUNTER — Ambulatory Visit: Payer: BLUE CROSS/BLUE SHIELD | Admitting: Cardiology

## 2018-07-17 ENCOUNTER — Encounter: Payer: Self-pay | Admitting: Cardiology

## 2018-07-17 ENCOUNTER — Ambulatory Visit (INDEPENDENT_AMBULATORY_CARE_PROVIDER_SITE_OTHER): Payer: BLUE CROSS/BLUE SHIELD | Admitting: Cardiology

## 2018-07-17 VITALS — BP 134/68 | HR 84 | Ht 70.0 in | Wt 185.0 lb

## 2018-07-17 DIAGNOSIS — I2510 Atherosclerotic heart disease of native coronary artery without angina pectoris: Secondary | ICD-10-CM | POA: Diagnosis not present

## 2018-07-17 DIAGNOSIS — I10 Essential (primary) hypertension: Secondary | ICD-10-CM

## 2018-07-17 DIAGNOSIS — E119 Type 2 diabetes mellitus without complications: Secondary | ICD-10-CM

## 2018-07-17 DIAGNOSIS — I5022 Chronic systolic (congestive) heart failure: Secondary | ICD-10-CM | POA: Diagnosis not present

## 2018-07-17 DIAGNOSIS — E785 Hyperlipidemia, unspecified: Secondary | ICD-10-CM | POA: Diagnosis not present

## 2018-07-17 NOTE — Progress Notes (Signed)
Cardiology Office Note:    Date:  07/17/2018   ID:  Alec Taylor, DOB 1945/12/14, MRN [id number]  PCP:  Sharlene Dory, DO  Cardiologist:  [name], MD  Referring MD: Sharlene Dory*   Chief Complaint  Patient presents with  . Follow-up    CHF, CAD    History of Present Illness:    Alec Taylor is a 72 y.o. male with a past medical history significant for CAD S/P CABG (approximately 2004 in [facility]), hypertension, hyperlipidemia, diabetes type 2 and gastric bypass. Alec Taylor has been followed by Dr. Beverely Pace in the past for cardiology.  He was hospitalized for pneumonia in 12/2017.  Echocardiogram at that time showed LVEF of 45-50%.   He was hospitalized 5/20-5/23/2019 for evaluation of shortness of breath and exertional chest tightness.   An echocardiogram on 04/29/2017 revealed a reduced LVEF of 35-40% with diffuse hypokinesis.  Given this change in LV function the patient was taken for cardiac catheterization on 04/30/2018 which showed dilated cardiomyopathy with severely reduced LVEF of roughly 20-25%, severe multivessel disease with patent LIMA-LAD and SVG-PDA.  There were no obvious culprit lesions to explain significant drop in ejection fraction.  Guideline directed medical therapy was initiated including carvedilol and Entresto.  He was seen in the advanced heart failure clinic by Dr. [name] on 06/13/2018 at which time he was noted to have had some low blood pressures with lightheadedness.  His Lasix was switched to only as needed. Low dose spironolactone was added. Dr. [name] has ordered a cardiac MRI to further evaluate.  Today he is here alone for follow up. Unfortunately his 57 year old daughter has been diagnosed with brain cancer, Glioma, and he and his wife have been very stressed about that. Cardiac wise he feels that he is doing well. His dizziness has improved and he has had no shortness of breath, orthopnea, PND, or edema. His wt has been stable at  home. He had one increase by 2 pounds that he recognized was related to eating Sushi and drinking beer one night. His blood pressures are stable with home readings in 120's/60's.   Past Medical History:  Diagnosis Date  . CAD (coronary artery disease) 09/2007   s/p CABG approximately 2004-[facility] (Cath - 17 Sep 2007:90% prox LAD with diffuse 85% narrowing to distal segment, 80% prox CXA, RCA: prox 90%, prox-mid 70%, mid 85%, distal 80%, D1 80%; Normal EF 65% --status post CABG--LIMA to LAD, SVG to PDA.    [name] COPD (chronic obstructive pulmonary disease) (HCC)   . Depression   . Diabetes mellitus without complication (HCC)   . Hypertension   . Ischemic cardiomyopathy   . Pleural effusion   . S/P CABG x 2    CABG--LIMA to LAD, SVG to PDA.      Past Surgical History:  Procedure Laterality Date  . APPENDECTOMY    . CHOLECYSTECTOMY    . CORONARY ARTERY BYPASS GRAFT    . GASTRIC BYPASS    . LEFT HEART CATH AND CORS/GRAFTS ANGIOGRAPHY N/A 04/30/2018   Procedure: LEFT HEART CATH AND CORS/GRAFTS ANGIOGRAPHY;  Surgeon: Marykay Lex, MD;  Location: [facility] INVASIVE CV LAB;  Service: Cardiovascular;  Laterality: N/A;    Current Medications: Current Meds  Medication Sig  . albuterol (PROVENTIL HFA;VENTOLIN HFA) 108 (90 Base) MCG/ACT inhaler Inhale into the lungs every 6 (six) hours as needed for wheezing or shortness of breath.  [name] ammonium lactate (LAC-HYDRIN) 12 % lotion Apply topically 2 (two) times  daily as needed. To feet  . aspirin EC 81 MG tablet Take 81 mg by mouth daily.  [name] atorvastatin (LIPITOR) 20 MG tablet Take 20 mg by mouth daily.  [name] buPROPion (WELLBUTRIN XL) 150 MG 24 hr tablet Take 150 mg by mouth every morning.  . busPIRone (BUSPAR) 15 MG tablet Take 15 mg by mouth every morning.  . carvedilol (COREG) 3.125 MG tablet Take 1 tablet (3.125 mg total) by mouth 2 (two) times daily with a meal.  . Cholecalciferol (VITAMIN D3) 5000 units CAPS Take 5,000 Units by mouth 2 (two) times daily.  .  clonazePAM (KLONOPIN) 0.5 MG tablet Take 0.5 mg by mouth at bedtime as needed.  . donepezil (ARICEPT) 5 MG tablet Take 5 mg by mouth at bedtime.  . DULoxetine (CYMBALTA) 60 MG capsule Take 60 mg by mouth at bedtime.  . finasteride (PROSCAR) 5 MG tablet Take 5 mg by mouth every evening.   . folic acid (FOLVITE) 1 MG tablet Take 1 mg by mouth daily.  [name] glimepiride (AMARYL) 2 MG tablet Take 2 mg by mouth every evening.  . metFORMIN (GLUCOPHAGE-XR) 500 MG 24 hr tablet Take 500 mg by mouth 2 (two) times daily.  . methotrexate (RHEUMATREX) 2.5 MG tablet Take 12.5 mg by mouth once a week. On Wednesday  . Multiple Vitamins-Minerals (MULTIVITAMIN ADULT PO) Take 1 tablet by mouth every morning.  [name] ofloxacin (OCUFLOX) 0.3 % ophthalmic solution Place 1 drop into both eyes 4 (four) times daily. Prior to procedures.  . pantoprazole (PROTONIX) 40 MG tablet Take 40 mg by mouth 2 (two) times daily.  [name] saccharomyces boulardii ([employer] DIGESTIVE PROBIOTIC) 250 MG capsule Take 250 mg by mouth every morning.  . sacubitril-valsartan (ENTRESTO) 49-51 MG Take 1 tablet by mouth 2 (two) times daily.  [name] spironolactone (ALDACTONE) 25 MG tablet Take 0.5 tablets (12.5 mg total) by mouth daily.  [name] zolpidem (AMBIEN CR) 12.5 MG CR tablet Take 12.5 mg by mouth at bedtime as needed.     Allergies:   Celecoxib; Losartan; and Ramipril   Social History   Socioeconomic History  . Marital status: Legally Separated    Spouse name: Not on file  . Number of children: Not on file  . Years of education: Not on file  . Highest education level: Not on file  Occupational History  . Not on file  Social Needs  . Financial resource strain: Not on file  . Food insecurity:    Worry: Not on file    Inability: Not on file  . Transportation needs:    Medical: Not on file    Non-medical: Not on file  Tobacco Use  . Smoking status: Never Smoker  . Smokeless tobacco: Never Used  Substance and Sexual Activity  . Alcohol use: Yes     Comment: occ  . Drug use: Never  . Sexual activity: Not on file  Lifestyle  . Physical activity:    Days per week: Not on file    Minutes per session: Not on file  . Stress: Not on file  Relationships  . Social connections:    Talks on phone: Not on file    Gets together: Not on file    Attends religious service: Not on file    Active member of club or organization: Not on file    Attends meetings of clubs or organizations: Not on file    Relationship status: Not on file  Other Topics Concern  . Not on file  Social History Narrative  . Not on file     Family History: The patient's family history includes Hypertension in his father. ROS:   Please see the history of present illness.     All other systems reviewed and are negative.  EKGs/Labs/Other Studies Reviewed:    The following studies were reviewed today:  ECHO 04/29/2018  Left ventricle: The cavity size was normal. Wall thickness was normal. Systolic function was moderately reduced. The estimated ejection fraction was in the range of 35% to 40%. Diffuse hypokinesis. The study is not technically sufficient to allow evaluation of LV diastolic function. - Aortic valve: There was mild regurgitation. - Mitral valve: Severely calcified annulus. There was mild regurgitation. - Left atrium: The atrium was mildly dilated. - Tricuspid valve: There was trivial regurgitation  LHC 04/30/2018   Prox RCA to Mid RCA lesion is 65% stenosed.  Dist RCA-1 lesion is 85% stenosed. Dist RCA-2 lesion is 95% stenosed with 80% stenosed side branch in Post Atrio.  SVG-rPDA graft was visualized by angiography and is normal in caliber and large. The graft exhibits no disease.  Ost RPDA lesion is 45% stenosed. Ost RPDA to prox RPDA lesion is 90% stenosed - This limits flow retrograde to the RPAV-RPL.  Ost LAD lesion is 55% stenosed.  Ost 1st Diag (small caliber vessel) lesion is 70% stenosed.  Prox LAD lesion is 100%  stenosed.  LIMA-LAD graft is large caliber, widely patent perfusing a small caliber distal LAD  Prox Cx to Mid Cx stent is 25% stenosed.  Dist Cx lesion is 65% stenosed. Ost 3rd Mrg to 3rd Mrg lesion is 70% stenosed. -Both limbs of the distal bifurcation.  There is severe left ventricular systolic dysfunction. The left ventricular ejection fraction is less than 25% by visual estimate.  LV end diastolic pressure is moderately elevated.  Significant aortic and mitral valve calcification.  Calcified pericardium   EKG:  EKG is ordered today.    Recent Labs: 04/28/2018: B Natriuretic Peptide 623.2; Magnesium 1.9; TSH 3.571 04/29/2018: ALT 36 05/28/2018: Hemoglobin 10.1; Platelets 216 07/08/2018: BUN 27; Creatinine, Ser 1.00; Potassium 4.8; Sodium 140   Recent Lipid Panel    Component Value Date/Time   CHOL 120 05/08/2018 0949   TRIG 77 05/08/2018 0949   HDL 39 (L) 05/08/2018 0949   CHOLHDL 3.1 05/08/2018 0949   LDLCALC 66 05/08/2018 0949    Physical Exam:    VS:  BP 134/68   Pulse 84   Ht 5\' 10"  (1.778 m)   Wt 185 lb (83.9 kg)   BMI 26.54 kg/m     Wt Readings from Last 3 Encounters:  07/17/18 185 lb (83.9 kg)  06/25/18 187 lb 12.8 oz (85.2 kg)  06/13/18 188 lb 6.4 oz (85.5 kg)     Physical Exam  Constitutional: He is oriented to person, place, and time. He appears well-developed and well-nourished. No distress.  HENT:  Head: Normocephalic and atraumatic.  Neck: Normal range of motion. Neck supple. No JVD present.  Cardiovascular: Normal rate, regular rhythm, normal heart sounds and intact distal pulses. Exam reveals no gallop and no friction rub.  No murmur heard. Pulmonary/Chest: Effort normal and breath sounds normal. No respiratory distress. He has no wheezes. He has no rales.  Abdominal: Soft. Bowel sounds are normal. He exhibits no distension.  Musculoskeletal: Normal range of motion. He exhibits no edema.  Neurological: He is alert and oriented to person,  place, and time.  Skin: Skin is warm and dry.  Psychiatric: He has a normal mood and affect. His behavior is normal. Judgment and thought content normal.  Vitals reviewed.  ASSESSMENT:    1. Chronic systolic heart failure (HCC)   2. Coronary artery disease involving native coronary artery of native heart without angina pectoris   3. Hyperlipidemia LDL goal <70   4. Essential (primary) hypertension   5. Diabetes mellitus type 2 in nonobese [facility])    PLAN:    In order of problems listed above:  Chronic systolic heart failure, NYHA class II: Recent decrease in EF to 35-40% from 45-50%. ?viral vs PVC. On low-dose carvedilol, Entresto 49-51 mg twice daily, Lasix only as needed. Being seen in the advanced heart failure clinic with plan to repeat echo in September.  Spironolactone 12.5 mg added. Dr. [name] has ordered a cardiac MRI to further evaluate. His BP is now stable and he is no longer having lightheadedness. Volume is stable with no HF symptoms. Tolerating current medications well. Will defer any up-titration to [facility], appt next week.  CAD: Status post CABG 2004. Recent cath showed patent grafts with no target lesions for intervention. No ischemic cause for his decrease in LV function. He continues on aspirin, beta-blocker, statin. No anginal symptoms  Hyperlipidemia: On atorvastatin 20 mg daily. LDL 66 in 04/2018. At goal of <70.   Hypertension: BP currently sell controlled and stable. 120's/60's. He has adjusted to the HF meds, no longer having lightheadedness.   Diabetes type 2: Hemoglobin A1c was 6.8 on 04/29/2018. Adequate control.   Management per PCP.  Anemia: Recent low iron stores.  Patient has received Feraheme.  PVCs: Only occasional and thought to not probably be enough to account for his decrease in EF.  Medication Adjustments/Labs and Tests Ordered: Current medicines are reviewed at length with the patient today.  Concerns regarding medicines are outlined above. Labs and  tests ordered and medication changes are outlined in the patient instructions below:  Patient Instructions  Medication Instructions: Your physician recommends that you continue on your current medications as directed. Please refer to the Current Medication list given to you today.   Labwork: None  Procedures/Testing: None  Follow-Up: Keep follow up appointment with Dr. [name] on 07/24/18 at 9:40 AM   Any Additional Special Instructions Will Be Listed Below (If Applicable).     If you need a refill on your cardiac medications before your next appointment, please call your pharmacy.      Signed, Berton Bon, NP  07/17/2018 1:08 PM    [facility]

## 2018-07-17 NOTE — Patient Instructions (Addendum)
Medication Instructions: Your physician recommends that you continue on your current medications as directed. Please refer to the Current Medication list given to you today.   Labwork: None  Procedures/Testing: None  Follow-Up: Keep follow up appointment with Dr. [name] on 07/24/18 at 9:40 AM   Any Additional Special Instructions Will Be Listed Below (If Applicable).     If you need a refill on your cardiac medications before your next appointment, please call your pharmacy.

## 2018-07-22 ENCOUNTER — Encounter: Payer: Self-pay | Admitting: Internal Medicine

## 2018-07-23 ENCOUNTER — Ambulatory Visit (HOSPITAL_COMMUNITY)
Admission: RE | Admit: 2018-07-23 | Discharge: 2018-07-23 | Disposition: A | Discharge status: Home / Self Care | Payer: BLUE CROSS/BLUE SHIELD | Source: Ambulatory Visit | Attending: Internal Medicine | Admitting: Internal Medicine

## 2018-07-23 DIAGNOSIS — I281 Aneurysm of pulmonary artery: Secondary | ICD-10-CM | POA: Insufficient documentation

## 2018-07-23 DIAGNOSIS — I5022 Chronic systolic (congestive) heart failure: Secondary | ICD-10-CM | POA: Diagnosis not present

## 2018-07-23 DIAGNOSIS — I255 Ischemic cardiomyopathy: Secondary | ICD-10-CM | POA: Diagnosis not present

## 2018-07-23 DIAGNOSIS — I080 Rheumatic disorders of both mitral and aortic valves: Secondary | ICD-10-CM | POA: Diagnosis not present

## 2018-07-23 DIAGNOSIS — Z86010 Personal history of colonic polyps: Secondary | ICD-10-CM | POA: Diagnosis not present

## 2018-07-23 DIAGNOSIS — D509 Iron deficiency anemia, unspecified: Secondary | ICD-10-CM | POA: Diagnosis not present

## 2018-07-23 DIAGNOSIS — K219 Gastro-esophageal reflux disease without esophagitis: Secondary | ICD-10-CM | POA: Diagnosis not present

## 2018-07-23 MED ORDER — GADOBENATE DIMEGLUMINE 529 MG/ML IV SOLN
25.0000 mL | Freq: Once | INTRAVENOUS | Status: AC | PRN
  Administered 2018-07-23: 25 mL via INTRAVENOUS

## 2018-07-24 ENCOUNTER — Ambulatory Visit (HOSPITAL_COMMUNITY)
Admission: RE | Admit: 2018-07-24 | Discharge: 2018-07-24 | Disposition: A | Discharge status: Home / Self Care | Payer: BLUE CROSS/BLUE SHIELD | Source: Ambulatory Visit | Attending: Internal Medicine | Admitting: Internal Medicine

## 2018-07-24 ENCOUNTER — Other Ambulatory Visit: Payer: Self-pay

## 2018-07-24 ENCOUNTER — Encounter (HOSPITAL_COMMUNITY): Payer: Self-pay | Admitting: Internal Medicine

## 2018-07-24 VITALS — BP 107/58 | HR 85 | Wt 189.2 lb

## 2018-07-24 DIAGNOSIS — Z8249 Family history of ischemic heart disease and other diseases of the circulatory system: Secondary | ICD-10-CM | POA: Diagnosis not present

## 2018-07-24 DIAGNOSIS — I5022 Chronic systolic (congestive) heart failure: Secondary | ICD-10-CM

## 2018-07-24 DIAGNOSIS — Z7984 Long term (current) use of oral hypoglycemic drugs: Secondary | ICD-10-CM | POA: Diagnosis not present

## 2018-07-24 DIAGNOSIS — I110 Hypertensive heart disease with heart failure: Secondary | ICD-10-CM | POA: Diagnosis not present

## 2018-07-24 DIAGNOSIS — I255 Ischemic cardiomyopathy: Secondary | ICD-10-CM | POA: Diagnosis not present

## 2018-07-24 DIAGNOSIS — D649 Anemia, unspecified: Secondary | ICD-10-CM | POA: Diagnosis not present

## 2018-07-24 DIAGNOSIS — F329 Major depressive disorder, single episode, unspecified: Secondary | ICD-10-CM | POA: Diagnosis not present

## 2018-07-24 DIAGNOSIS — E119 Type 2 diabetes mellitus without complications: Secondary | ICD-10-CM | POA: Diagnosis not present

## 2018-07-24 DIAGNOSIS — Z79899 Other long term (current) drug therapy: Secondary | ICD-10-CM | POA: Insufficient documentation

## 2018-07-24 DIAGNOSIS — J449 Chronic obstructive pulmonary disease, unspecified: Secondary | ICD-10-CM | POA: Diagnosis not present

## 2018-07-24 DIAGNOSIS — Z951 Presence of aortocoronary bypass graft: Secondary | ICD-10-CM | POA: Insufficient documentation

## 2018-07-24 DIAGNOSIS — I2510 Atherosclerotic heart disease of native coronary artery without angina pectoris: Secondary | ICD-10-CM | POA: Diagnosis not present

## 2018-07-24 DIAGNOSIS — Z7982 Long term (current) use of aspirin: Secondary | ICD-10-CM | POA: Insufficient documentation

## 2018-07-24 DIAGNOSIS — Z09 Encounter for follow-up examination after completed treatment for conditions other than malignant neoplasm: Secondary | ICD-10-CM | POA: Diagnosis not present

## 2018-07-24 DIAGNOSIS — I493 Ventricular premature depolarization: Secondary | ICD-10-CM | POA: Diagnosis not present

## 2018-07-24 MED ORDER — CARVEDILOL 6.25 MG PO TABS: 6.2500 mg | ORAL_TABLET | Freq: Two times a day (BID) | ORAL | 6 refills | Status: DC

## 2018-07-24 MED ORDER — SPIRONOLACTONE 25 MG PO TABS: 12.5000 mg | ORAL_TABLET | Freq: Every day | ORAL | 6 refills | Status: DC

## 2018-07-24 NOTE — Progress Notes (Signed)
Advanced Heart Failure Clinic Note   Referring Physician: Dr Excell Seltzer Primary Care: Dr Derrell Lolling Primary [facility] Cardiologist: Dr Beverely Pace   HPI: Alec Taylor is a 72 y.o. male  with history of CAD s/p CABG 2004 at [facility], HTN, hyperlipidemia, DMII, obesity s/p gastric bypass and newly diagnosed heart failure.   Echocardiogram1/2019 during his last hospital admission revealed LVEF of 45 to 50%.   Recently diagnosed with acute systolic heart failure. He saw his PCP on 04/18/2018 for SOB. Patient was started on albuterol, PFTs were ordered and patient was to get CXR.[name]   Admitted 04/28/18 with increased dyspnea. CTA negative for PE. ECHO performed and showed reduced EF 35-40%. He underwent LHC . Stable coronary disease and grafts. Diuresed well. Discharge weight was 177 pounds.   Today he returns for HF follow up. Last visit lasix was stopped and spironolactone was added. Overall feeling fine. Denies SOB/PND/Orthopnea. Every now and then dizzy. No edema.  Able to go to the grocery store and walk around without difficulty. Appetite ok. No fever or chills. Weight at home 182 pounds. Taking all medications. Lives with his wife. Daughter recovering for GMB resection at [facility].    CMRI 8/14  1. Normal left ventricular size with mild concentric hypertrophy and severely decreased systolic function (LVEF = 38%) with diffuse hypokinesis. There is pericardial late gadolinium enhancement in the basal and mid inferior walls. 2. Normal right ventricular size, thickness and borderline systolic function (LVEF = 45%). There are no regional wall motion abnormalities. 3.  Moderately dilated left atrium, mildly dilated right atrium. 4. Normal size of the aortic root, ascending aorta. Mildly dilated pulmonary artery measuring 31 mm. 5.  Mild aortic, mitral and trivial tricuspid regurgitation. 6.  Normal pericardium.  No pericardial effusion  ECHO 04/29/2018  Left ventricle: The cavity size was normal. Wall  thickness was   normal. Systolic function was moderately reduced. The estimated   ejection fraction was in the range of 35% to 40%. Diffuse   hypokinesis. The study is not technically sufficient to allow   evaluation of LV diastolic function. - Aortic valve: There was mild regurgitation. - Mitral valve: Severely calcified annulus. There was mild   regurgitation. - Left atrium: The atrium was mildly dilated. - Tricuspid valve: There was trivial regurgitation  LHC 04/30/2018   Prox RCA to Mid RCA lesion is 65% stenosed.  Dist RCA-1 lesion is 85% stenosed. Dist RCA-2 lesion is 95% stenosed with 80% stenosed side branch in Post Atrio.  SVG-rPDA graft was visualized by angiography and is normal in caliber and large. The graft exhibits no disease.  Ost RPDA lesion is 45% stenosed. Ost RPDA to prox RPDA lesion is 90% stenosed - This limits flow retrograde to the RPAV-RPL.  Ost LAD lesion is 55% stenosed.  Ost 1st Diag (small caliber vessel) lesion is 70% stenosed.  Prox LAD lesion is 100% stenosed.  LIMA-LAD graft is large caliber, widely patent perfusing a small caliber distal LAD  Prox Cx to Mid Cx stent is 25% stenosed.  Dist Cx lesion is 65% stenosed. Ost 3rd Mrg to 3rd Mrg lesion is 70% stenosed. -Both limbs of the distal bifurcation.  There is severe left ventricular systolic dysfunction. The left ventricular ejection fraction is less than 25% by visual estimate.  LV end diastolic pressure is moderately elevated.  Significant aortic and mitral valve calcification.  Calcified pericardium  Review of systems complete and found to be negative unless listed in HPI.    Past Medical History:  Diagnosis Date  . CAD (coronary artery disease) 09/2007   s/p CABG approximately 2004-[facility] (Cath - 17 Sep 2007:90% prox LAD with diffuse 85% narrowing to distal segment, 80% prox CXA, RCA: prox 90%, prox-mid 70%, mid 85%, distal 80%, D1 80%; Normal EF 65% --status post CABG--LIMA to LAD, SVG  to PDA.    [name] COPD (chronic obstructive pulmonary disease) (HCC)   . Depression   . Diabetes mellitus without complication (HCC)   . Hypertension   . Ischemic cardiomyopathy   . Pleural effusion   . S/P CABG x 2    CABG--LIMA to LAD, SVG to PDA.     Current Outpatient Medications  Medication Sig Dispense Refill  . albuterol (PROVENTIL HFA;VENTOLIN HFA) 108 (90 Base) MCG/ACT inhaler Inhale into the lungs every 6 (six) hours as needed for wheezing or shortness of breath.    [name] ammonium lactate (LAC-HYDRIN) 12 % lotion Apply topically 2 (two) times daily as needed. To feet  5  . aspirin EC 81 MG tablet Take 81 mg by mouth daily.    [name] atorvastatin (LIPITOR) 20 MG tablet Take 20 mg by mouth daily.  3  . buPROPion (WELLBUTRIN XL) 150 MG 24 hr tablet Take 150 mg by mouth every morning.    . busPIRone (BUSPAR) 15 MG tablet Take 15 mg by mouth every morning.    . carvedilol (COREG) 3.125 MG tablet Take 1 tablet (3.125 mg total) by mouth 2 (two) times daily with a meal. 120 tablet 3  . Cholecalciferol (VITAMIN D3) 5000 units CAPS Take 5,000 Units by mouth 2 (two) times daily.    . clonazePAM (KLONOPIN) 0.5 MG tablet Take 0.5 mg by mouth at bedtime as needed.    . donepezil (ARICEPT) 5 MG tablet Take 5 mg by mouth at bedtime.    . DULoxetine (CYMBALTA) 60 MG capsule Take 60 mg by mouth at bedtime.    . finasteride (PROSCAR) 5 MG tablet Take 5 mg by mouth every evening.   6  . folic acid (FOLVITE) 1 MG tablet Take 1 mg by mouth daily.  2  . glimepiride (AMARYL) 2 MG tablet Take 2 mg by mouth every evening.  1  . metFORMIN (GLUCOPHAGE-XR) 500 MG 24 hr tablet Take 500 mg by mouth 2 (two) times daily.  1  . methotrexate (RHEUMATREX) 2.5 MG tablet Take 12.5 mg by mouth once a week. On Wednesday  3  . Multiple Vitamins-Minerals (MULTIVITAMIN ADULT PO) Take 1 tablet by mouth every morning.    [name] ofloxacin (OCUFLOX) 0.3 % ophthalmic solution Place 1 drop into both eyes 4 (four) times daily. Prior to  procedures.    . pantoprazole (PROTONIX) 40 MG tablet Take 40 mg by mouth 2 (two) times daily.  0  . saccharomyces boulardii ([employer] DIGESTIVE PROBIOTIC) 250 MG capsule Take 250 mg by mouth every morning.    . sacubitril-valsartan (ENTRESTO) 49-51 MG Take 1 tablet by mouth 2 (two) times daily. 60 tablet 11  . spironolactone (ALDACTONE) 25 MG tablet Take 0.5 tablets (12.5 mg total) by mouth daily. 15 tablet 3  . zolpidem (AMBIEN CR) 12.5 MG CR tablet Take 12.5 mg by mouth at bedtime as needed.     No current facility-administered medications for this encounter.    Allergies  Allergen Reactions  . Celecoxib     hyperkalemia  . Losartan     hyperkalemia  . Ramipril     Cough    Social History   Socioeconomic History  .  Marital status: Legally Separated    Spouse name: Not on file  . Number of children: Not on file  . Years of education: Not on file  . Highest education level: Not on file  Occupational History  . Not on file  Social Needs  . Financial resource strain: Not on file  . Food insecurity:    Worry: Not on file    Inability: Not on file  . Transportation needs:    Medical: Not on file    Non-medical: Not on file  Tobacco Use  . Smoking status: Never Smoker  . Smokeless tobacco: Never Used  Substance and Sexual Activity  . Alcohol use: Yes    Comment: occ  . Drug use: Never  . Sexual activity: Not on file  Lifestyle  . Physical activity:    Days per week: Not on file    Minutes per session: Not on file  . Stress: Not on file  Relationships  . Social connections:    Talks on phone: Not on file    Gets together: Not on file    Attends religious service: Not on file    Active member of club or organization: Not on file    Attends meetings of clubs or organizations: Not on file    Relationship status: Not on file  . Intimate partner violence:    Fear of current or ex partner: Not on file    Emotionally abused: Not on file    Physically abused: Not on file     Forced sexual activity: Not on file  Other Topics Concern  . Not on file  Social History Narrative  . Not on file    Family History  Problem Relation Age of Onset  . Hypertension Father    Vitals:   07/24/18 0946  BP: (!) 107/58  Pulse: 85  SpO2: 99%  Weight: 85.8 kg (189 lb 4 oz)     Wt Readings from Last 3 Encounters:  07/24/18 85.8 kg (189 lb 4 oz)  07/17/18 83.9 kg (185 lb)  06/25/18 85.2 kg (187 lb 12.8 oz)     PHYSICAL EXAM: General:  Well appearing. No resp difficulty HEENT: normal Neck: supple. no JVD. Carotids 2+ bilat; no bruits. No lymphadenopathy or thryomegaly appreciated. Cor: PMI nondisplaced. Regular rate & rhythm. No rubs, gallops or murmurs. Lungs: clear Abdomen: soft, nontender, nondistended. No hepatosplenomegaly. No bruits or masses. Good bowel sounds. Extremities: no cyanosis, clubbing, rash, edema Neuro: alert & orientedx3, cranial nerves grossly intact. moves all 4 extremities w/o difficulty. Affect pleasant    ASSESSMENT & PLAN:  1. Chronic Systolic Heart Failure EF has dropped from 45-50% in January of this year to 35-40%.  ECHO 04/2018 EF 35-40%. ?Viral versus PVC CMRI EF 38% pericardial late gadolinium enhancement in the basal and mid inferior walls. Suspicious for recent myocarditis.   NYHA II. Volume status stable. Continue lasix as needed.  - Increase Coreg 6.25 mg BID.  - Continue Entresto 49-51 mg BID.  -  Continue spironolactone 12.5 mg daily.  K 4.8 on 07/08/2018    2. CAD S/P CABG  -No s/s ischemia .    - LHC 04/30/2018 severe multivessel disease and patent LIMA-LAD SVG-PDA - Continue statin, bb and will need to start 81 mg aspirin.   3. Anemia - Iron stores low 05/28/18. Has received feraheme.   4. DMII  - hgb A1C 6.8  - Per PCP.   5. PVCs - Occasional on previous EKG -- Have  discussed holter monitor  Follow up in 8 weeks. MRI results discussed  Tonye Becket, NP  9:54 AM   Patient seen and examined with Tonye Becket, NP.  We discussed all aspects of the encounter. I agree with the assessment and plan as stated above.   Doing well NYHA II. Volume status looks good. cMRI results reviewed with him personally. LVEF 38% Small area of LGE uptake in basal.mid inferior wall ? Myocarditis (vs infarct). No infiltrative process. Will increase carvedilol. No need for ICD with EF 38%.  Arvilla Meres, MD  10:43 AM

## 2018-07-24 NOTE — Patient Instructions (Signed)
Increase Carvedilol to 6.25 mg Twice daily   Your physician recommends that you schedule a follow-up appointment in: 2 months

## 2018-07-28 DIAGNOSIS — D509 Iron deficiency anemia, unspecified: Secondary | ICD-10-CM | POA: Diagnosis not present

## 2018-07-28 DIAGNOSIS — K219 Gastro-esophageal reflux disease without esophagitis: Secondary | ICD-10-CM | POA: Diagnosis not present

## 2018-07-28 DIAGNOSIS — E119 Type 2 diabetes mellitus without complications: Secondary | ICD-10-CM | POA: Diagnosis not present

## 2018-07-28 DIAGNOSIS — E559 Vitamin D deficiency, unspecified: Secondary | ICD-10-CM | POA: Diagnosis not present

## 2018-07-29 DIAGNOSIS — N202 Calculus of kidney with calculus of ureter: Secondary | ICD-10-CM | POA: Diagnosis not present

## 2018-07-29 DIAGNOSIS — I700 Atherosclerosis of aorta: Secondary | ICD-10-CM | POA: Diagnosis not present

## 2018-07-29 DIAGNOSIS — K6289 Other specified diseases of anus and rectum: Secondary | ICD-10-CM | POA: Diagnosis not present

## 2018-07-29 DIAGNOSIS — D509 Iron deficiency anemia, unspecified: Secondary | ICD-10-CM | POA: Diagnosis not present

## 2018-08-04 DIAGNOSIS — F411 Generalized anxiety disorder: Secondary | ICD-10-CM | POA: Diagnosis not present

## 2018-08-05 DIAGNOSIS — E113513 Type 2 diabetes mellitus with proliferative diabetic retinopathy with macular edema, bilateral: Secondary | ICD-10-CM | POA: Diagnosis not present

## 2018-08-05 DIAGNOSIS — Z961 Presence of intraocular lens: Secondary | ICD-10-CM | POA: Diagnosis not present

## 2018-08-05 DIAGNOSIS — T378X5D Adverse effect of other specified systemic anti-infectives and antiparasitics, subsequent encounter: Secondary | ICD-10-CM | POA: Diagnosis not present

## 2018-08-05 DIAGNOSIS — H35373 Puckering of macula, bilateral: Secondary | ICD-10-CM | POA: Diagnosis not present

## 2018-08-06 DIAGNOSIS — E1142 Type 2 diabetes mellitus with diabetic polyneuropathy: Secondary | ICD-10-CM | POA: Diagnosis not present

## 2018-08-06 DIAGNOSIS — E119 Type 2 diabetes mellitus without complications: Secondary | ICD-10-CM | POA: Diagnosis not present

## 2018-08-06 DIAGNOSIS — K760 Fatty (change of) liver, not elsewhere classified: Secondary | ICD-10-CM | POA: Diagnosis not present

## 2018-08-06 DIAGNOSIS — E1129 Type 2 diabetes mellitus with other diabetic kidney complication: Secondary | ICD-10-CM | POA: Diagnosis not present

## 2018-08-18 DIAGNOSIS — Z9049 Acquired absence of other specified parts of digestive tract: Secondary | ICD-10-CM | POA: Diagnosis not present

## 2018-08-18 DIAGNOSIS — R93429 Abnormal radiologic findings on diagnostic imaging of unspecified kidney: Secondary | ICD-10-CM | POA: Diagnosis not present

## 2018-08-18 DIAGNOSIS — K838 Other specified diseases of biliary tract: Secondary | ICD-10-CM | POA: Diagnosis not present

## 2018-08-18 DIAGNOSIS — N2889 Other specified disorders of kidney and ureter: Secondary | ICD-10-CM | POA: Diagnosis not present

## 2018-08-26 DIAGNOSIS — L97529 Non-pressure chronic ulcer of other part of left foot with unspecified severity: Secondary | ICD-10-CM | POA: Diagnosis not present

## 2018-08-26 DIAGNOSIS — L03032 Cellulitis of left toe: Secondary | ICD-10-CM | POA: Diagnosis not present

## 2018-08-26 DIAGNOSIS — L97522 Non-pressure chronic ulcer of other part of left foot with fat layer exposed: Secondary | ICD-10-CM | POA: Diagnosis not present

## 2018-08-29 ENCOUNTER — Ambulatory Visit: Payer: BLUE CROSS/BLUE SHIELD | Admitting: Medical

## 2018-08-29 ENCOUNTER — Encounter: Payer: Self-pay | Admitting: Medical

## 2018-08-29 VITALS — BP 114/58 | HR 80 | Temp 98.3°F | Resp 16 | Ht 70.0 in | Wt 189.0 lb

## 2018-08-29 DIAGNOSIS — J40 Bronchitis, not specified as acute or chronic: Secondary | ICD-10-CM

## 2018-08-29 MED ORDER — FLUTICASONE PROPIONATE 50 MCG/ACT NA SUSP
2.0000 | Freq: Every day | NASAL | 1 refills | Status: DC
Start: 2018-08-29 — End: 2018-09-20

## 2018-08-29 MED ORDER — DOXYCYCLINE HYCLATE 100 MG PO TABS
100.0000 mg | ORAL_TABLET | Freq: Two times a day (BID) | ORAL | 0 refills | Status: DC
Start: 2018-08-29 — End: 2018-09-02

## 2018-08-29 MED ORDER — HYDROCODONE-HOMATROPINE 5-1.5 MG/5ML PO SYRP: 5.0000 mL | ORAL_SOLUTION | Freq: Three times a day (TID) | ORAL | 0 refills | Status: DC | PRN

## 2018-08-29 NOTE — Progress Notes (Signed)
   Subjective:    Patient ID: Alec Taylor, male    DOB: 1946/07/01, 72 y.o.   MRN: [id number]  HPI  Pt in with some recent runny nose, nasal sneezing and chest congestion. Symptoms since this Wednesday. Pt felt mild chills recently. Pt states he is coughing up a lot of mucus. His nose is running. Pt expresses concern for pneumonia. He did have pneumonia last year in January.  Some sneezing.  Mild wheeze intermittent.    Review of Systems  Constitutional: Positive for chills. Negative for fatigue and fever.       One mild.  HENT: Positive for congestion.   Respiratory: Positive for cough and wheezing. Negative for choking, chest tightness and shortness of breath.        Mild wheezing.  Cardiovascular: Negative for chest pain and palpitations.  Gastrointestinal: Negative for abdominal pain.  Musculoskeletal: Negative for back pain.  Hematological: Negative for adenopathy. Does not bruise/bleed easily.  Psychiatric/Behavioral: Negative for behavioral problems and confusion. The patient is not nervous/anxious.        Objective:   Physical Exam   General  Mental Status - Alert. General Appearance - Well groomed. Not in acute distress.  Skin Rashes- No Rashes.  HEENT Head- Normal. Ear Auditory Canal - Left- Normal. Right - Normal.Tympanic Membrane- Left- Normal. Right- Normal. Eye Sclera/Conjunctiva- Left- Normal. Right- Normal. Nose & Sinuses Nasal Mucosa- Left-  Boggy and Congested. Right-  Boggy and  Congested.Bilateral no  maxillary and no frontal sinus pressure. Mouth & Throat Lips: Upper Lip- Normal: no dryness, cracking, pallor, cyanosis, or vesicular eruption. Lower Lip-Normal: no dryness, cracking, pallor, cyanosis or vesicular eruption. Buccal Mucosa- Bilateral- No Aphthous ulcers. Oropharynx- No Discharge or Erythema. Tonsils: Characteristics- Bilateral- No Erythema or Congestion. Size/Enlargement- Bilateral- No enlargement. Discharge-  bilateral-None.  Neck Neck- Supple. No Masses.   Chest and Lung Exam Auscultation: Breath Sounds:-Clear even and unlabored.  Cardiovascular Auscultation:Rythm- Regular, rate and rhythm. Murmurs & Other Heart Sounds:Ausculatation of the heart reveal- No Murmurs.  Lymphatic Head & Neck General Head & Neck Lymphatics: Bilateral: Description- No Localized lymphadenopathy.   Lower ext- no pedal edema. Negative homans signs     Assessment & Plan:  You appear to have bronchitis. Rest hydrate and tylenol for fever. I am prescribing cough medicine hycodan, and doxycycline antibiotic. For your nasal congestion(if worsens) can use flonase.  For wheezing you can use albuterol inhaler if needed. If using every 6 hours back to back notify us.  You should gradually get better. If not then notify us and would recommend a chest xray.  Follow up in 7-10 days or as needed  [employer], [employer]

## 2018-08-29 NOTE — Patient Instructions (Signed)
You appear to have bronchitis. Rest hydrate and tylenol for fever. I am prescribing cough medicine hycodan, and doxycycline antibiotic. For your nasal congestion(if worsens) can use flonase.  For wheezing you can use albuterol inhaler if needed. If using every 6 hours back to back notify us.  You should gradually get better. If not then notify us and would recommend a chest xray.  Follow up in 7-10 days or as needed

## 2018-09-02 ENCOUNTER — Ambulatory Visit (HOSPITAL_BASED_OUTPATIENT_CLINIC_OR_DEPARTMENT_OTHER)
Admission: RE | Admit: 2018-09-02 | Discharge: 2018-09-02 | Disposition: A | Discharge status: Home / Self Care | Payer: BLUE CROSS/BLUE SHIELD | Source: Ambulatory Visit | Attending: Medical | Admitting: Medical

## 2018-09-02 ENCOUNTER — Ambulatory Visit (INDEPENDENT_AMBULATORY_CARE_PROVIDER_SITE_OTHER): Payer: BLUE CROSS/BLUE SHIELD | Admitting: Medical

## 2018-09-02 ENCOUNTER — Encounter: Payer: Self-pay | Admitting: Medical

## 2018-09-02 ENCOUNTER — Telehealth: Payer: Self-pay | Admitting: Medical

## 2018-09-02 VITALS — BP 104/82 | HR 91 | Temp 98.7°F | Resp 16 | Ht 70.0 in | Wt 186.6 lb

## 2018-09-02 DIAGNOSIS — R05 Cough: Secondary | ICD-10-CM | POA: Insufficient documentation

## 2018-09-02 DIAGNOSIS — R0989 Other specified symptoms and signs involving the circulatory and respiratory systems: Secondary | ICD-10-CM | POA: Diagnosis not present

## 2018-09-02 DIAGNOSIS — R062 Wheezing: Secondary | ICD-10-CM

## 2018-09-02 DIAGNOSIS — R059 Cough, unspecified: Secondary | ICD-10-CM

## 2018-09-02 LAB — CBC WITH DIFFERENTIAL/PLATELET
BASOS ABS: 0 10*3/uL (ref 0.0–0.1)
Basophils Relative: 0.7 % (ref 0.0–3.0)
EOS PCT: 3.2 % (ref 0.0–5.0)
Eosinophils Absolute: 0.2 10*3/uL (ref 0.0–0.7)
HCT: 34.8 % — ABNORMAL LOW (ref 39.0–52.0)
HEMOGLOBIN: 11.7 g/dL — AB (ref 13.0–17.0)
Lymphocytes Relative: 19.5 % (ref 12.0–46.0)
Lymphs Abs: 1.2 10*3/uL (ref 0.7–4.0)
MCHC: 33.7 g/dL (ref 30.0–36.0)
MCV: 84.8 fl (ref 78.0–100.0)
Monocytes Absolute: 0.5 10*3/uL (ref 0.1–1.0)
Monocytes Relative: 8.1 % (ref 3.0–12.0)
Neutro Abs: 4.1 10*3/uL (ref 1.4–7.7)
Neutrophils Relative %: 68.5 % (ref 43.0–77.0)
Platelets: 236 10*3/uL (ref 150.0–400.0)
RBC: 4.1 Mil/uL — AB (ref 4.22–5.81)
RDW: 16.8 % — ABNORMAL HIGH (ref 11.5–15.5)
WBC: 6 10*3/uL (ref 4.0–10.5)

## 2018-09-02 MED ORDER — BENZONATATE 100 MG PO CAPS: ORAL_CAPSULE | ORAL | 0 refills | Status: DC

## 2018-09-02 MED ORDER — PREDNISONE 10 MG (21) PO TBPK: ORAL_TABLET | ORAL | 0 refills | Status: DC

## 2018-09-02 MED ORDER — FLUTICASONE PROPIONATE HFA 110 MCG/ACT IN AERO: 2.0000 | INHALATION_SPRAY | Freq: Two times a day (BID) | RESPIRATORY_TRACT | 12 refills | Status: DC

## 2018-09-02 MED ORDER — IPRATROPIUM BROMIDE HFA 17 MCG/ACT IN AERS: 2.0000 | INHALATION_SPRAY | Freq: Four times a day (QID) | RESPIRATORY_TRACT | 12 refills | Status: DC | PRN

## 2018-09-02 MED ORDER — CEFTRIAXONE SODIUM 1 G IJ SOLR
1.0000 g | Freq: Once | INTRAMUSCULAR | Status: AC
  Administered 2018-09-02: 1 g via INTRAMUSCULAR

## 2018-09-02 NOTE — Progress Notes (Signed)
Subjective:    Patient ID: Alec Taylor, male    DOB: Feb 05, 1946, 72 y.o.   MRN: [id number]  HPI  Pt in states he states he never go better fully. He states during the day he has moderate cough. Cough medicine I gave will control cough at night. No fever, no chills or sweats.. Last night before he went to bed vomited cough was so severe. Pt states he was wheezing 2 pm to 10 pm was wheezing. Other days wheezing very minimally.  I gave pt doxycycline antibiotic. Pt also had hycodan.   Pt used his albuterol inhaler couple of times yesterday.  Pt last a1c was in level of a1c was 7.1 on 07/28/2018.    Review of Systems  Constitutional: Negative for chills, fatigue and fever.  HENT: Negative for congestion.   Respiratory: Positive for cough and wheezing. Negative for chest tightness and shortness of breath.   Cardiovascular: Negative for chest pain and palpitations.  Gastrointestinal: Positive for vomiting. Negative for abdominal pain, constipation and nausea.       One time after severe cough episode.  Musculoskeletal: Negative for back pain.  Neurological: Negative for dizziness, weakness, numbness and headaches.  Hematological: Negative for adenopathy. Does not bruise/bleed easily.  Psychiatric/Behavioral: Negative for behavioral problems and confusion.   Past Medical History:  Diagnosis Date  . CAD (coronary artery disease) 09/2007   s/p CABG approximately 2004-[facility] (Cath - 17 Sep 2007:90% prox LAD with diffuse 85% narrowing to distal segment, 80% prox CXA, RCA: prox 90%, prox-mid 70%, mid 85%, distal 80%, D1 80%; Normal EF 65% --status post CABG--LIMA to LAD, SVG to PDA.    [name] COPD (chronic obstructive pulmonary disease) (HCC)   . Depression   . Diabetes mellitus without complication (HCC)   . Hypertension   . Ischemic cardiomyopathy   . Pleural effusion   . S/P CABG x 2    CABG--LIMA to LAD, SVG to PDA.       Social History   Socioeconomic History  . Marital status: Legally  Separated    Spouse name: Not on file  . Number of children: Not on file  . Years of education: Not on file  . Highest education level: Not on file  Occupational History  . Not on file  Social Needs  . Financial resource strain: Not on file  . Food insecurity:    Worry: Not on file    Inability: Not on file  . Transportation needs:    Medical: Not on file    Non-medical: Not on file  Tobacco Use  . Smoking status: Never Smoker  . Smokeless tobacco: Never Used  Substance and Sexual Activity  . Alcohol use: Yes    Comment: occ  . Drug use: Never  . Sexual activity: Not on file  Lifestyle  . Physical activity:    Days per week: Not on file    Minutes per session: Not on file  . Stress: Not on file  Relationships  . Social connections:    Talks on phone: Not on file    Gets together: Not on file    Attends religious service: Not on file    Active member of club or organization: Not on file    Attends meetings of clubs or organizations: Not on file    Relationship status: Not on file  . Intimate partner violence:    Fear of current or ex partner: Not on file    Emotionally abused: Not on file  Physically abused: Not on file    Forced sexual activity: Not on file  Other Topics Concern  . Not on file  Social History Narrative  . Not on file    Past Surgical History:  Procedure Laterality Date  . APPENDECTOMY    . CHOLECYSTECTOMY    . CORONARY ARTERY BYPASS GRAFT    . GASTRIC BYPASS    . LEFT HEART CATH AND CORS/GRAFTS ANGIOGRAPHY N/A 04/30/2018   Procedure: LEFT HEART CATH AND CORS/GRAFTS ANGIOGRAPHY;  Surgeon: Marykay Lex, MD;  Location: [facility] INVASIVE CV LAB;  Service: Cardiovascular;  Laterality: N/A;    Family History  Problem Relation Age of Onset  . Hypertension Father     Allergies  Allergen Reactions  . Celecoxib     hyperkalemia  . Losartan     hyperkalemia  . Ramipril     Cough     Current Outpatient Medications on File Prior to Visit    Medication Sig Dispense Refill  . albuterol (PROVENTIL HFA;VENTOLIN HFA) 108 (90 Base) MCG/ACT inhaler Inhale into the lungs every 6 (six) hours as needed for wheezing or shortness of breath.    [name] ammonium lactate (LAC-HYDRIN) 12 % lotion Apply topically 2 (two) times daily as needed. To feet  5  . aspirin EC 81 MG tablet Take 81 mg by mouth daily.    [name] atorvastatin (LIPITOR) 20 MG tablet Take 20 mg by mouth daily.  3  . buPROPion (WELLBUTRIN XL) 150 MG 24 hr tablet Take 150 mg by mouth every morning.    . busPIRone (BUSPAR) 15 MG tablet Take 15 mg by mouth every morning.    . carvedilol (COREG) 6.25 MG tablet Take 1 tablet (6.25 mg total) by mouth 2 (two) times daily with a meal. 60 tablet 6  . Cholecalciferol (VITAMIN D3) 5000 units CAPS Take 5,000 Units by mouth 2 (two) times daily.    . clonazePAM (KLONOPIN) 0.5 MG tablet Take 0.5 mg by mouth at bedtime as needed.    . donepezil (ARICEPT) 5 MG tablet Take 5 mg by mouth at bedtime.    [name] doxycycline (VIBRAMYCIN) 100 MG capsule TAKE ONE CAPSULE BY MOUTH TWICE A DAY FOR 10 DAYS  0  . DULoxetine (CYMBALTA) 60 MG capsule Take 60 mg by mouth at bedtime.    . finasteride (PROSCAR) 5 MG tablet Take 5 mg by mouth every evening.   6  . fluticasone (FLONASE) 50 MCG/ACT nasal spray Place 2 sprays into both nostrils daily. 16 g 1  . folic acid (FOLVITE) 1 MG tablet Take 1 mg by mouth daily.  2  . glimepiride (AMARYL) 2 MG tablet Take 2 mg by mouth every evening.  1  . HYDROcodone-homatropine (HYCODAN) 5-1.5 MG/5ML syrup Take 5 mLs by mouth every 8 (eight) hours as needed for cough. 100 mL 0  . metFORMIN (GLUCOPHAGE-XR) 500 MG 24 hr tablet Take 500 mg by mouth 2 (two) times daily.  1  . methotrexate (RHEUMATREX) 2.5 MG tablet Take 12.5 mg by mouth once a week. On Wednesday  3  . Multiple Vitamins-Minerals (MULTIVITAMIN ADULT PO) Take 1 tablet by mouth every morning.    [name] ofloxacin (OCUFLOX) 0.3 % ophthalmic solution Place 1 drop into both eyes 4 (four)  times daily. Prior to procedures.    . pantoprazole (PROTONIX) 40 MG tablet Take 40 mg by mouth 2 (two) times daily.  0  . saccharomyces boulardii ([employer] DIGESTIVE PROBIOTIC) 250 MG capsule Take 250 mg by mouth every  morning.    . sacubitril-valsartan (ENTRESTO) 49-51 MG Take 1 tablet by mouth 2 (two) times daily. 60 tablet 11  . spironolactone (ALDACTONE) 25 MG tablet Take 0.5 tablets (12.5 mg total) by mouth daily. 15 tablet 6  . zolpidem (AMBIEN CR) 12.5 MG CR tablet Take 12.5 mg by mouth at bedtime as needed.     No current facility-administered medications on file prior to visit.     BP 104/82   Pulse 91   Temp 98.7 F (37.1 C) (Oral)   Resp 16   Ht 5\' 10"  (1.778 m)   Wt 186 lb 9.6 oz (84.6 kg)   SpO2 95%   BMI 26.77 kg/m       Objective:   Physical Exam  General  Mental Status - Alert. General Appearance - Well groomed. Not in acute distress.  Skin Rashes- No Rashes.  HEENT Head- Normal. Ear Auditory Canal - Left- Normal. Right - Normal.Tympanic Membrane- Left- Normal. Right- Normal. Eye Sclera/Conjunctiva- Left- Normal. Right- Normal. Nose & Sinuses Nasal Mucosa- Left-  Boggy and Congested. Right-  Boggy and  Congested.Bilateral maxillary and frontal sinus pressure. Mouth & Throat Lips: Upper Lip- Normal: no dryness, cracking, pallor, cyanosis, or vesicular eruption. Lower Lip-Normal: no dryness, cracking, pallor, cyanosis or vesicular eruption. Buccal Mucosa- Bilateral- No Aphthous ulcers. Oropharynx- No Discharge or Erythema. Tonsils: Characteristics- Bilateral- No Erythema or Congestion. Size/Enlargement- Bilateral- No enlargement. Discharge- bilateral-None.  Neck Neck- Supple. No Masses.   Chest and Lung Exam Auscultation: Breath Sounds:- even and unlabored.but bilateral rhonchi with expiratory wheeze.  Cardiovascular Auscultation:Rythm- Regular, rate and rhythm. Murmurs & Other Heart Sounds:Ausculatation of the heart reveal- No  Murmurs.  Lymphatic Head & Neck General Head & Neck Lymphatics: Bilateral: Description- No Localized lymphadenopathy.  Lower ext- no pedal edema. Negative homans signs.       Assessment & Plan:  You have persisting symptoms of cough, chest congestion and wheezing despite the use of Hycodan and doxycycline.  We need to get chest x-ray today and CBC.  We need to  evaluate if by chance x-ray showing infection that exceeded treatment of doxycycline antibiotic.  This will also help in determining if symptoms more related to airway constriction/inflammation.  I did prescribe you benzonatate tablets to use 1 to 2 tablets every 8 hours during the day.  You can resort to Hycodan at night.  When you run out of Hycodan let me know and I will send in a new prescription.  Reminder not to use both benzonatate and Hycodan at the same time.  For wheezing I refilling albuterol inhaler to use every 6 hours as needed.  Also sending in inhaler Atrovent to use every 6 hours.  In addition sent in prescription of Flovent inhaler.  After reviewing x-ray will determine which oral antibiotic will prescribe.  Also might need to give a taper dose of prednisone if you are not improving or if the inhalers are too expensive.  Since you do have diabetes would need to be cautious in the event that I have to prescribe prednisone.  In the event I need to prescribe prednisone would want you to check your sugars daily and make sure you do not see sugars exceeding 200.  In that event sometimes need to give sliding scale mealtime insulin.   Esperanza Richters, PA-C Follow-up in 7 to 10 days or as needed.

## 2018-09-02 NOTE — Telephone Encounter (Signed)
Rx tape prednisone sent to pt pharmacy.

## 2018-09-02 NOTE — Patient Instructions (Addendum)
You have persisting symptoms of cough, chest congestion and wheezing despite the use of Hycodan and doxycycline.  We need to get chest x-ray today and CBC.  We need to  evaluate if by chance x-ray showing infection that exceeded treatment of doxycycline antibiotic.  This will also help in determining if symptoms more related to airway constriction/inflammation.  I did prescribe you benzonatate tablets to use 1 to 2 tablets every 8 hours during the day.  You can resort to Hycodan at night.  When you run out of Hycodan let me know and I will send in a new prescription.  Reminder not to use both benzonatate and Hycodan at the same time.  For wheezing I refilling albuterol inhaler to use every 6 hours as needed.  Also sending in inhaler Atrovent to use every 6 hours.  In addition sent in prescription of Flovent inhaler.  After reviewing x-ray will determine which oral antibiotic will prescribe.  Also might need to give a taper dose of prednisone if you are not improving or if the inhalers are too expensive.  Since you do have diabetes would need to be cautious in the event that I have to prescribe prednisone.  In the event I need to prescribe prednisone would want you to check your sugars daily and make sure you do not see sugars exceeding 200.  In that event sometimes need to give sliding scale mealtime insulin.  Follow-up in 7 to 10 days or as needed.

## 2018-09-05 ENCOUNTER — Telehealth: Payer: Self-pay | Admitting: Family Medicine

## 2018-09-05 MED ORDER — HYDROCODONE-HOMATROPINE 5-1.5 MG/5ML PO SYRP: 5.0000 mL | ORAL_SOLUTION | Freq: Three times a day (TID) | ORAL | 0 refills | Status: DC | PRN

## 2018-09-05 NOTE — Telephone Encounter (Signed)
Sent in hycodan rx to pt pharmacy.

## 2018-09-05 NOTE — Telephone Encounter (Signed)
Copied from CRM [id number]. Topic: Quick Communication - Rx Refill/Question >> Sep 05, 2018 10:06 AM Burchel, Abbi R wrote: Medication: HYDROcodone-homatropine (HYCODAN) 5-1.5 MG/5ML syrup  Preferred Pharmacy: [id number] - [place], [place] - [address] [place] [address] Phone: [phone] Fax: [phone]   Pt was advised that RX refills may take up to 3 business days. Pt is completely out of this medication and is still having coughing a lot  during the day.

## 2018-09-05 NOTE — Telephone Encounter (Signed)
Hi Edward- I see you took care of this patient so will refer this request to you if you feel it is appropriate. Thanks. NW

## 2018-09-11 ENCOUNTER — Other Ambulatory Visit: Payer: Self-pay | Admitting: Psychiatry

## 2018-09-16 DIAGNOSIS — Z961 Presence of intraocular lens: Secondary | ICD-10-CM | POA: Diagnosis not present

## 2018-09-16 DIAGNOSIS — H353233 Exudative age-related macular degeneration, bilateral, with inactive scar: Secondary | ICD-10-CM | POA: Diagnosis not present

## 2018-09-16 DIAGNOSIS — E119 Type 2 diabetes mellitus without complications: Secondary | ICD-10-CM | POA: Diagnosis not present

## 2018-09-16 DIAGNOSIS — E11621 Type 2 diabetes mellitus with foot ulcer: Secondary | ICD-10-CM | POA: Diagnosis not present

## 2018-09-16 DIAGNOSIS — E1142 Type 2 diabetes mellitus with diabetic polyneuropathy: Secondary | ICD-10-CM | POA: Diagnosis not present

## 2018-09-16 DIAGNOSIS — L97522 Non-pressure chronic ulcer of other part of left foot with fat layer exposed: Secondary | ICD-10-CM | POA: Diagnosis not present

## 2018-09-16 DIAGNOSIS — L84 Corns and callosities: Secondary | ICD-10-CM | POA: Diagnosis not present

## 2018-09-19 DIAGNOSIS — N289 Disorder of kidney and ureter, unspecified: Secondary | ICD-10-CM | POA: Diagnosis not present

## 2018-09-19 DIAGNOSIS — N201 Calculus of ureter: Secondary | ICD-10-CM | POA: Diagnosis not present

## 2018-09-20 ENCOUNTER — Other Ambulatory Visit: Payer: Self-pay | Admitting: Medical

## 2018-09-24 ENCOUNTER — Encounter: Payer: Self-pay | Admitting: Emergency Medicine

## 2018-09-24 DIAGNOSIS — F411 Generalized anxiety disorder: Secondary | ICD-10-CM

## 2018-09-24 DIAGNOSIS — F329 Major depressive disorder, single episode, unspecified: Secondary | ICD-10-CM

## 2018-09-29 ENCOUNTER — Ambulatory Visit (INDEPENDENT_AMBULATORY_CARE_PROVIDER_SITE_OTHER): Payer: BLUE CROSS/BLUE SHIELD | Admitting: Psychiatry

## 2018-09-29 ENCOUNTER — Encounter: Payer: Self-pay | Admitting: Psychiatry

## 2018-09-29 VITALS — BP 111/49 | HR 93

## 2018-09-29 DIAGNOSIS — F3341 Major depressive disorder, recurrent, in partial remission: Secondary | ICD-10-CM | POA: Diagnosis not present

## 2018-09-29 DIAGNOSIS — F5101 Primary insomnia: Secondary | ICD-10-CM | POA: Diagnosis not present

## 2018-09-29 DIAGNOSIS — F411 Generalized anxiety disorder: Secondary | ICD-10-CM

## 2018-09-29 NOTE — Progress Notes (Signed)
Alec Taylor [id number] 08-10-1946 72 y.o.  Subjective:   Patient ID:  Alec Taylor is a 72 y.o. (DOB 01/07/1946) male.  Chief Complaint:  Chief Complaint  Patient presents with  . Follow-up    h/o anxiety, depression, and insomnia    HPI Alec Taylor presents to the office today for follow-up of depression, anxiety, and insomnia. He reports that tomorrow they are going to daughter's follow-up of brain cancer after surgery and to discuss tx options for her. Reports that daughter is doing well considering recent health issues, aside from some drowsiness with anticonvulsants. "I've been dealing with it." He reports mood has improved. He reports that he is maintaining a healthy weight and is "breathing better and sleeping better." Reports that he made a decision at work that should help with his frustrations at work. 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 Reports that his anxiety is less now that more things are settled at work and some decisions have been made. Denies any significant depression. Reports some sadness in response to age related changes interfering with the things he enjoyed. He reports one episode where he experienced some irritability and took Xanax prn and was told by his wife that he handled a difficult situation "beautifully." He reports that Xanax prn was effective and "there was no residual" difficulty with concentration and drowsiness. He reports that his energy and motivation has been good. Appetite has been good. Reports that he is enjoying his work more with recent changes. He reports adequate concentration. Reports that he had some moments of passive death wishes with increased psychosocial stressors. Denies any further passive death wishes since business was settled. Denies SI.     Medications: I have reviewed the patient's current  medications.  Current Outpatient Medications  Medication Sig Dispense Refill  . albuterol (PROVENTIL HFA;VENTOLIN HFA) 108 (90 Base) MCG/ACT inhaler Inhale into the lungs every 6 (six) hours as needed for wheezing or shortness of breath.    . ALPRAZolam (XANAX) 0.25 MG tablet Take 0.25 mg by mouth daily as needed for anxiety. Take 1/2-1 po qd prn anxiety    . ammonium lactate (LAC-HYDRIN) 12 % lotion Apply topically 2 (two) times daily as needed. To feet  5  . aspirin EC 81 MG tablet Take 81 mg by mouth daily.    [name] atorvastatin (LIPITOR) 20 MG tablet Take 20 mg by mouth daily.  3  . benzonatate (TESSALON) 100 MG capsule 1-2 tab po 8 hours as needed for cough 30 capsule 0  . buPROPion (WELLBUTRIN XL) 150 MG 24 hr tablet Take 150 mg by mouth every morning.    . busPIRone (BUSPAR) 15 MG tablet Take 15 mg by mouth 2 (two) times daily.     . carvedilol (COREG) 6.25 MG tablet Take 1 tablet (6.25 mg total) by mouth 2 (two) times daily with a meal. 60 tablet 6  . Cholecalciferol (VITAMIN D3) 5000 units CAPS Take 5,000 Units by mouth 2 (two) times daily.    [name] donepezil (ARICEPT) 5 MG tablet TAKE ONE TABLET BY MOUTH ONE TIME DAILY 90 tablet 1  . doxycycline (VIBRAMYCIN) 100 MG capsule TAKE ONE CAPSULE BY MOUTH TWICE A DAY FOR 10 DAYS  0  . DULoxetine (CYMBALTA) 60 MG capsule Take 60 mg by mouth every morning.     . finasteride (PROSCAR) 5 MG tablet Take 5 mg by mouth  every evening.   6  . fluticasone (FLONASE) 50 MCG/ACT nasal spray SPRAY 2 SPRAYS INTO EACH NOSTRIL EVERY DAY 16 g 1  . fluticasone (FLOVENT HFA) 110 MCG/ACT inhaler Inhale 2 puffs into the lungs 2 (two) times daily. 1 Inhaler 12  . folic acid (FOLVITE) 1 MG tablet Take 1 mg by mouth daily.  2  . glimepiride (AMARYL) 2 MG tablet Take 2 mg by mouth every evening.  1  . HYDROcodone-homatropine (HYCODAN) 5-1.5 MG/5ML syrup Take 5 mLs by mouth every 8 (eight) hours as needed for cough. 100 mL 0  . ipratropium (ATROVENT HFA) 17 MCG/ACT inhaler  Inhale 2 puffs into the lungs every 6 (six) hours as needed for wheezing. 1 Inhaler 12  . metFORMIN (GLUCOPHAGE-XR) 500 MG 24 hr tablet Take 500 mg by mouth 2 (two) times daily.  1  . methotrexate (RHEUMATREX) 2.5 MG tablet Take 12.5 mg by mouth once a week. On Wednesday  3  . Multiple Vitamins-Minerals (MULTIVITAMIN ADULT PO) Take 1 tablet by mouth every morning.    [name] ofloxacin (OCUFLOX) 0.3 % ophthalmic solution Place 1 drop into both eyes 4 (four) times daily. Prior to procedures.    . pantoprazole (PROTONIX) 40 MG tablet Take 40 mg by mouth 2 (two) times daily.  0  . predniSONE (STERAPRED UNI-PAK 21 TAB) 10 MG (21) TBPK tablet 6 day 1, 5 day 2, 4 day 3, 3 day 4, 2 day 5, 1 day 6. 21 tablet 0  . saccharomyces boulardii ([employer] DIGESTIVE PROBIOTIC) 250 MG capsule Take 250 mg by mouth every morning.    . sacubitril-valsartan (ENTRESTO) 49-51 MG Take 1 tablet by mouth 2 (two) times daily. 60 tablet 11  . spironolactone (ALDACTONE) 25 MG tablet Take 0.5 tablets (12.5 mg total) by mouth daily. 15 tablet 6  . zolpidem (AMBIEN CR) 12.5 MG CR tablet Take 12.5 mg by mouth at bedtime as needed.     No current facility-administered medications for this visit.     Medication Side Effects: None  Allergies:  Allergies  Allergen Reactions  . Celecoxib     hyperkalemia  . Losartan     hyperkalemia  . Ramipril     Cough     Past Medical History:  Diagnosis Date  . CAD (coronary artery disease) 09/2007   s/p CABG approximately 2004-[facility] (Cath - 17 Sep 2007:90% prox LAD with diffuse 85% narrowing to distal segment, 80% prox CXA, RCA: prox 90%, prox-mid 70%, mid 85%, distal 80%, D1 80%; Normal EF 65% --status post CABG--LIMA to LAD, SVG to PDA.    [name] COPD (chronic obstructive pulmonary disease) (HCC)   . Depression   . Diabetes mellitus without complication (HCC)   . Hypertension   . Ischemic cardiomyopathy   . Pleural effusion   . S/P CABG x 2    CABG--LIMA to LAD, SVG to PDA.      Family  History  Problem Relation Age of Onset  . Hypertension Father     Social History   Socioeconomic History  . Marital status: Legally Separated    Spouse name: Not on file  . Number of children: Not on file  . Years of education: Not on file  . Highest education level: Not on file  Occupational History  . Not on file  Social Needs  . Financial resource strain: Not on file  . Food insecurity:    Worry: Not on file    Inability: Not on file  . Transportation needs:  Medical: Not on file    Non-medical: Not on file  Tobacco Use  . Smoking status: Never Smoker  . Smokeless tobacco: Never Used  Substance and Sexual Activity  . Alcohol use: Yes    Comment: occ  . Drug use: Never  . Sexual activity: Not on file  Lifestyle  . Physical activity:    Days per week: Not on file    Minutes per session: Not on file  . Stress: Not on file  Relationships  . Social connections:    Talks on phone: Not on file    Gets together: Not on file    Attends religious service: Not on file    Active member of club or organization: Not on file    Attends meetings of clubs or organizations: Not on file    Relationship status: Not on file  . Intimate partner violence:    Fear of current or ex partner: Not on file    Emotionally abused: Not on file    Physically abused: Not on file    Forced sexual activity: Not on file  Other Topics Concern  . Not on file  Social History Narrative  . Not on file    Past Medical History, Surgical history, Social history, and Family history were reviewed and updated as appropriate.   Please see review of systems for further details on the patient's review from today.   Review of Systems:  Review of Systems  Constitutional: Negative for fatigue.  Genitourinary: Negative.   Neurological: Positive for dizziness.  Psychiatric/Behavioral: Negative for dysphoric mood and suicidal ideas. The patient is not nervous/anxious.     Objective:   Physical Exam:   BP (!) 111/49   Pulse 93   Physical Exam  Constitutional: He is oriented to person, place, and time. He appears well-developed. No distress.  Musculoskeletal: Normal range of motion.  Neurological: He is alert and oriented to person, place, and time. Coordination normal.  Psychiatric: He has a normal mood and affect. His speech is normal and behavior is normal. Judgment and thought content normal. His mood appears not anxious. His affect is not angry, not blunt, not labile and not inappropriate. Cognition and memory are normal. He does not exhibit a depressed mood. He expresses no homicidal and no suicidal ideation. He expresses no suicidal plans and no homicidal plans.    Lab Review:     Component Value Date/Time   NA 140 07/08/2018 0926   K 4.8 07/08/2018 0926   CL 102 07/08/2018 0926   CO2 24 07/08/2018 0926   GLUCOSE 261 (H) 07/08/2018 0926   GLUCOSE 74 06/25/2018 1140   BUN 27 07/08/2018 0926   CREATININE 1.00 07/08/2018 0926   CALCIUM 9.2 07/08/2018 0926   PROT 7.1 04/29/2018 0119   ALBUMIN 3.6 04/29/2018 0119   AST 34 04/29/2018 0119   ALT 36 04/29/2018 0119   ALKPHOS 90 04/29/2018 0119   BILITOT 0.9 04/29/2018 0119   GFRNONAA 76 07/08/2018 0926   GFRAA 88 07/08/2018 0926       Component Value Date/Time   WBC 6.0 09/02/2018 1020   RBC 4.10 (L) 09/02/2018 1020   HGB 11.7 (L) 09/02/2018 1020   HCT 34.8 (L) 09/02/2018 1020   PLT 236.0 09/02/2018 1020   MCV 84.8 09/02/2018 1020   MCH 25.1 (L) 05/28/2018 1047   MCHC 33.7 09/02/2018 1020   RDW 16.8 (H) 09/02/2018 1020   LYMPHSABS 1.2 09/02/2018 1020   MONOABS 0.5 09/02/2018 1020  EOSABS 0.2 09/02/2018 1020   BASOSABS 0.0 09/02/2018 1020    No results found for: POCLITH, LITHIUM   No results found for: PHENYTOIN, PHENOBARB, VALPROATE, CBMZ   .res Assessment: Plan:    Generalized anxiety disorder  Primary insomnia  Recurrent major depressive disorder, in partial remission (HCC) Will continue Wellbutrin XL  150 mg daily for depression Will continue Cymbalta 60 mg in the morning for anxiety and depression. Will continue Aricept 5 mg daily for cognition. Will continue Xanax 0.25 mg 1/2-1 tab daily as needed for anxiety. Will continue Ambien CR 12.5 mg at bedtime for insomnia. Please see After Visit Summary for patient specific instructions.  Future Appointments  Date Time Provider Department Center  10/02/2018 10:20 AM Bensimhon, Bevelyn Buckles, MD [facility] None  10/29/2018  8:30 AM Sharlene Dory, DO [facility]  12/29/2018  9:30 AM Corie Chiquito, PMHNP [facility] None    No orders of the defined types were placed in this encounter.     -------------------------------

## 2018-10-02 ENCOUNTER — Encounter (HOSPITAL_COMMUNITY): Payer: BLUE CROSS/BLUE SHIELD | Admitting: Internal Medicine

## 2018-10-02 DIAGNOSIS — N201 Calculus of ureter: Secondary | ICD-10-CM | POA: Diagnosis not present

## 2018-10-02 DIAGNOSIS — N289 Disorder of kidney and ureter, unspecified: Secondary | ICD-10-CM | POA: Diagnosis not present

## 2018-10-08 ENCOUNTER — Ambulatory Visit (HOSPITAL_COMMUNITY)
Admission: RE | Admit: 2018-10-08 | Discharge: 2018-10-08 | Disposition: A | Discharge status: Home / Self Care | Payer: BLUE CROSS/BLUE SHIELD | Source: Ambulatory Visit | Attending: Internal Medicine | Admitting: Internal Medicine

## 2018-10-08 ENCOUNTER — Encounter (HOSPITAL_COMMUNITY): Payer: Self-pay | Admitting: Internal Medicine

## 2018-10-08 ENCOUNTER — Other Ambulatory Visit: Payer: Self-pay

## 2018-10-08 VITALS — BP 123/42 | HR 93 | Wt 190.6 lb

## 2018-10-08 DIAGNOSIS — D649 Anemia, unspecified: Secondary | ICD-10-CM | POA: Diagnosis not present

## 2018-10-08 DIAGNOSIS — J449 Chronic obstructive pulmonary disease, unspecified: Secondary | ICD-10-CM | POA: Diagnosis not present

## 2018-10-08 DIAGNOSIS — I2510 Atherosclerotic heart disease of native coronary artery without angina pectoris: Secondary | ICD-10-CM

## 2018-10-08 DIAGNOSIS — E119 Type 2 diabetes mellitus without complications: Secondary | ICD-10-CM | POA: Diagnosis not present

## 2018-10-08 DIAGNOSIS — Z79899 Other long term (current) drug therapy: Secondary | ICD-10-CM | POA: Diagnosis not present

## 2018-10-08 DIAGNOSIS — Z951 Presence of aortocoronary bypass graft: Secondary | ICD-10-CM | POA: Diagnosis not present

## 2018-10-08 DIAGNOSIS — I110 Hypertensive heart disease with heart failure: Secondary | ICD-10-CM | POA: Insufficient documentation

## 2018-10-08 DIAGNOSIS — Z01810 Encounter for preprocedural cardiovascular examination: Secondary | ICD-10-CM

## 2018-10-08 DIAGNOSIS — E669 Obesity, unspecified: Secondary | ICD-10-CM | POA: Diagnosis not present

## 2018-10-08 DIAGNOSIS — E785 Hyperlipidemia, unspecified: Secondary | ICD-10-CM | POA: Diagnosis not present

## 2018-10-08 DIAGNOSIS — Z9884 Bariatric surgery status: Secondary | ICD-10-CM | POA: Diagnosis not present

## 2018-10-08 DIAGNOSIS — I5022 Chronic systolic (congestive) heart failure: Secondary | ICD-10-CM

## 2018-10-08 DIAGNOSIS — N200 Calculus of kidney: Secondary | ICD-10-CM | POA: Insufficient documentation

## 2018-10-08 DIAGNOSIS — Z7984 Long term (current) use of oral hypoglycemic drugs: Secondary | ICD-10-CM | POA: Diagnosis not present

## 2018-10-08 DIAGNOSIS — F329 Major depressive disorder, single episode, unspecified: Secondary | ICD-10-CM | POA: Diagnosis not present

## 2018-10-08 DIAGNOSIS — Z7982 Long term (current) use of aspirin: Secondary | ICD-10-CM | POA: Diagnosis not present

## 2018-10-08 DIAGNOSIS — I255 Ischemic cardiomyopathy: Secondary | ICD-10-CM | POA: Insufficient documentation

## 2018-10-08 NOTE — Patient Instructions (Signed)
Your physician has requested that you have an echocardiogram. Echocardiography is a painless test that uses sound waves to create images of your heart. It provides your doctor with information about the size and shape of your heart and how well your heart's chambers and valves are working. This procedure takes approximately one hour. There are no restrictions for this procedure.    Your physician recommends that you schedule a follow-up appointment in: 2-3 months

## 2018-10-08 NOTE — Progress Notes (Signed)
Advanced Heart Failure Clinic Note   Referring Physician: Dr Excell Seltzer Primary Care: Dr Derrell Lolling Primary [facility] Cardiologist: Dr Beverely Pace   HPI: Alec Taylor is a 72 y.o. male with history of CAD s/p CABG 2004 at [facility], HTN, hyperlipidemia, DMII, obesity s/p gastric bypass and systolic HF.   Echocardiogram 12/2017  revealed LVEF of 45 to 50%.   Admitted 04/28/18 with increased dyspnea. CTA negative for PE. ECHO performed and showed reduced EF 35-40%. He underwent LHC . Stable coronary disease and grafts. Diuresed well. Discharge weight was 177 pounds.   Today he returns for HF follow up. Last visit coreg was increased. Overall doing okay. He recently had a spot on his kidney and is now being watched by urology. Also currently has a kidney stone and needs to have it extracted endoscopically. Options are block vs general anesthesia, but he is not sure which he will get. From HF standpoint doing really well. Denies SOB, orthopnea, PND, or edema. SOB with hills. Does not do stairs. Can shop around grocery store with no breaks. Energy level is better. Memory is better. Has occasional dizziness, but thinks it is related to psych meds. No CP or palpitations. Weighs occasionally at home, getting 180-182 lbs. SBP at home 100-130s. Taking all medications. Limits salt intake. Has had to take lasix a couple times since August.   CMRI 8/14  1. Normal left ventricular size with mild concentric hypertrophy and severely decreased systolic function (LVEF = 38%) with diffuse hypokinesis. There is pericardial late gadolinium enhancement in the basal and mid inferior walls. 2. Normal right ventricular size, thickness and borderline systolic function (LVEF = 45%). There are no regional wall motion abnormalities. 3.  Moderately dilated left atrium, mildly dilated right atrium. 4. Normal size of the aortic root, ascending aorta. Mildly dilated pulmonary artery measuring 31 mm. 5.  Mild aortic, mitral and trivial  tricuspid regurgitation. 6.  Normal pericardium.  No pericardial effusion  ECHO 04/29/2018  Left ventricle: The cavity size was normal. Wall thickness was   normal. Systolic function was moderately reduced. The estimated   ejection fraction was in the range of 35% to 40%. Diffuse   hypokinesis. The study is not technically sufficient to allow   evaluation of LV diastolic function. - Aortic valve: There was mild regurgitation. - Mitral valve: Severely calcified annulus. There was mild   regurgitation. - Left atrium: The atrium was mildly dilated. - Tricuspid valve: There was trivial regurgitation  LHC 04/30/2018   Prox RCA to Mid RCA lesion is 65% stenosed.  Dist RCA-1 lesion is 85% stenosed. Dist RCA-2 lesion is 95% stenosed with 80% stenosed side branch in Post Atrio.  SVG-rPDA graft was visualized by angiography and is normal in caliber and large. The graft exhibits no disease.  Ost RPDA lesion is 45% stenosed. Ost RPDA to prox RPDA lesion is 90% stenosed - This limits flow retrograde to the RPAV-RPL.  Ost LAD lesion is 55% stenosed.  Ost 1st Diag (small caliber vessel) lesion is 70% stenosed.  Prox LAD lesion is 100% stenosed.  LIMA-LAD graft is large caliber, widely patent perfusing a small caliber distal LAD  Prox Cx to Mid Cx stent is 25% stenosed.  Dist Cx lesion is 65% stenosed. Ost 3rd Mrg to 3rd Mrg lesion is 70% stenosed. -Both limbs of the distal bifurcation.  There is severe left ventricular systolic dysfunction. The left ventricular ejection fraction is less than 25% by visual estimate.  LV end diastolic pressure is moderately elevated.  Significant aortic and mitral valve calcification.  Calcified pericardium  Review of systems complete and found to be negative unless listed in HPI.   Past Medical History:  Diagnosis Date  . CAD (coronary artery disease) 09/2007   s/p CABG approximately 2004-[facility] (Cath - 17 Sep 2007:90% prox LAD with diffuse 85% narrowing  to distal segment, 80% prox CXA, RCA: prox 90%, prox-mid 70%, mid 85%, distal 80%, D1 80%; Normal EF 65% --status post CABG--LIMA to LAD, SVG to PDA.    [name] COPD (chronic obstructive pulmonary disease) (HCC)   . Depression   . Diabetes mellitus without complication (HCC)   . Hypertension   . Ischemic cardiomyopathy   . Pleural effusion   . S/P CABG x 2    CABG--LIMA to LAD, SVG to PDA.     Current Outpatient Medications  Medication Sig Dispense Refill  . ALPRAZolam (XANAX) 0.25 MG tablet Take 0.25 mg by mouth daily as needed for anxiety. Take 1/2-1 po qd prn anxiety    . ammonium lactate (LAC-HYDRIN) 12 % lotion Apply topically 2 (two) times daily as needed. To feet  5  . aspirin EC 81 MG tablet Take 81 mg by mouth daily.    [name] atorvastatin (LIPITOR) 20 MG tablet Take 20 mg by mouth daily.  3  . buPROPion (WELLBUTRIN XL) 150 MG 24 hr tablet Take 150 mg by mouth every morning.    . busPIRone (BUSPAR) 15 MG tablet Take 15 mg by mouth 2 (two) times daily.     . carvedilol (COREG) 6.25 MG tablet Take 1 tablet (6.25 mg total) by mouth 2 (two) times daily with a meal. 60 tablet 6  . Cholecalciferol (VITAMIN D3) 5000 units CAPS Take 5,000 Units by mouth 2 (two) times daily.    [name] donepezil (ARICEPT) 5 MG tablet TAKE ONE TABLET BY MOUTH ONE TIME DAILY 90 tablet 1  . DULoxetine (CYMBALTA) 60 MG capsule Take 60 mg by mouth every morning.     . finasteride (PROSCAR) 5 MG tablet Take 5 mg by mouth every evening.   6  . fluticasone (FLONASE) 50 MCG/ACT nasal spray SPRAY 2 SPRAYS INTO EACH NOSTRIL EVERY DAY 16 g 1  . folic acid (FOLVITE) 1 MG tablet Take 1 mg by mouth daily.  2  . glimepiride (AMARYL) 2 MG tablet Take 2 mg by mouth every evening.  1  . metFORMIN (GLUCOPHAGE-XR) 500 MG 24 hr tablet Take 500 mg by mouth 2 (two) times daily.  1  . methotrexate (RHEUMATREX) 2.5 MG tablet Take 12.5 mg by mouth once a week. On Wednesday  3  . Multiple Vitamins-Minerals (MULTIVITAMIN ADULT PO) Take 1 tablet by  mouth every morning.    [name] ofloxacin (OCUFLOX) 0.3 % ophthalmic solution Place 1 drop into both eyes 4 (four) times daily. Prior to procedures.    . pantoprazole (PROTONIX) 40 MG tablet Take 40 mg by mouth 2 (two) times daily.  0  . saccharomyces boulardii ([employer] DIGESTIVE PROBIOTIC) 250 MG capsule Take 250 mg by mouth every morning.    . sacubitril-valsartan (ENTRESTO) 49-51 MG Take 1 tablet by mouth 2 (two) times daily. 60 tablet 11  . spironolactone (ALDACTONE) 25 MG tablet Take 0.5 tablets (12.5 mg total) by mouth daily. 15 tablet 6  . zolpidem (AMBIEN CR) 12.5 MG CR tablet Take 12.5 mg by mouth at bedtime as needed.     No current facility-administered medications for this encounter.    Allergies  Allergen Reactions  . Celecoxib  hyperkalemia  . Losartan     hyperkalemia  . Ramipril     Cough    Social History   Socioeconomic History  . Marital status: Legally Separated    Spouse name: Not on file  . Number of children: Not on file  . Years of education: Not on file  . Highest education level: Not on file  Occupational History  . Not on file  Social Needs  . Financial resource strain: Not on file  . Food insecurity:    Worry: Not on file    Inability: Not on file  . Transportation needs:    Medical: Not on file    Non-medical: Not on file  Tobacco Use  . Smoking status: Never Smoker  . Smokeless tobacco: Never Used  Substance and Sexual Activity  . Alcohol use: Yes    Comment: occ  . Drug use: Never  . Sexual activity: Not on file  Lifestyle  . Physical activity:    Days per week: Not on file    Minutes per session: Not on file  . Stress: Not on file  Relationships  . Social connections:    Talks on phone: Not on file    Gets together: Not on file    Attends religious service: Not on file    Active member of club or organization: Not on file    Attends meetings of clubs or organizations: Not on file    Relationship status: Not on file  . Intimate  partner violence:    Fear of current or ex partner: Not on file    Emotionally abused: Not on file    Physically abused: Not on file    Forced sexual activity: Not on file  Other Topics Concern  . Not on file  Social History Narrative  . Not on file    Family History  Problem Relation Age of Onset  . Hypertension Father    Vitals:   10/08/18 1447  BP: (!) 123/42  Pulse: 93  SpO2: 97%  Weight: 86.5 kg (190 lb 9.6 oz)     Wt Readings from Last 3 Encounters:  10/08/18 86.5 kg (190 lb 9.6 oz)  09/02/18 84.6 kg (186 lb 9.6 oz)  08/29/18 85.7 kg (189 lb)     PHYSICAL EXAM: General:  Well appearing. No resp difficulty HEENT: normal Neck: supple. no JVD. Carotids 2+ bilat; no bruits. No lymphadenopathy or thryomegaly appreciated. Cor: PMI nondisplaced. Regular rate & rhythm. No rubs, gallops or murmurs. Lungs: clear Abdomen: soft, nontender, nondistended. No hepatosplenomegaly. No bruits or masses. Good bowel sounds. Extremities: no cyanosis, clubbing, rash, edema Neuro: alert & orientedx3, cranial nerves grossly intact. moves all 4 extremities w/o difficulty. Affect pleasant    ASSESSMENT & PLAN:  1. Chronic Systolic Heart Failure due to ICM EF has dropped from 45-50% in January of this year to 35-40%.  ECHO 04/2018 EF 35-40%. ?Viral versus PVC CMRI 07/2018 EF 38% pericardial late gadolinium enhancement in the basal and mid inferior walls. Suspicious for recent myocarditis.  - NYHA II. Volume status stable on exam. Continue lasix as needed. Takes about once/month - Continue coreg 6.25 mg BID - Continue Entresto 49-51 mg BID.  - Continue spironolactone 12.5 mg daily. K 4.8 07/08/18  2. CAD S/P CABG  - No s/s ischemia. - LHC 04/30/2018 severe multivessel disease and patent LIMA-LAD SVG-PDA - Continue statin, BB, and ASA  3. Anemia - Iron stores low 05/28/18. Has received feraheme. No change.  -  Hemoglobin stable 11.7 09/02/18.  4. DMII  - hgb A1C 6.8  - Per PCP. Consider  jardiance.   5. PVCs - Occasional on previous EKG - Have discussed holter monitor.  - No palpitations.   6. Kidney stone - Needs to have surgically removed. Not sure if he is getting a block or general anesthesia.   Follow up in 2 months with echo  Alford Highland, NP  2:52 PM     Patient seen and examined with the above-signed Advanced Practice Provider and/or Housestaff. I personally reviewed laboratory data, imaging studies and relevant notes. I independently examined the patient and formulated the important aspects of the plan. I have edited the note to reflect any of my changes or salient points. I have personally discussed the plan with the patient and/or family.  Overall doing well. NYHA II but not overly active. Volume status looks good. HR is mildly elevated.  Ideally would continue to titrate HF meds but with upcoming surgery next week we will not make any changes today. No s/s ischemia. LHC stable earlier this year. Ok to proceed with surgery from our standpoint. We will see back in 1-2 months. Will need repeat echo to assess for LV recovery.   Arvilla Meres, MD  9:01 PM

## 2018-10-17 ENCOUNTER — Other Ambulatory Visit: Payer: Self-pay | Admitting: Family Medicine

## 2018-10-20 DIAGNOSIS — R9431 Abnormal electrocardiogram [ECG] [EKG]: Secondary | ICD-10-CM | POA: Diagnosis not present

## 2018-10-20 DIAGNOSIS — N201 Calculus of ureter: Secondary | ICD-10-CM | POA: Diagnosis not present

## 2018-10-20 DIAGNOSIS — I454 Nonspecific intraventricular block: Secondary | ICD-10-CM | POA: Diagnosis not present

## 2018-10-20 DIAGNOSIS — E119 Type 2 diabetes mellitus without complications: Secondary | ICD-10-CM | POA: Diagnosis not present

## 2018-10-20 DIAGNOSIS — Z7984 Long term (current) use of oral hypoglycemic drugs: Secondary | ICD-10-CM | POA: Diagnosis not present

## 2018-10-29 ENCOUNTER — Encounter: Payer: Self-pay | Admitting: Family Medicine

## 2018-10-29 ENCOUNTER — Ambulatory Visit: Payer: BLUE CROSS/BLUE SHIELD | Admitting: Family Medicine

## 2018-10-29 VITALS — BP 118/68 | HR 92 | Temp 98.1°F | Ht 70.0 in | Wt 185.1 lb

## 2018-10-29 DIAGNOSIS — I10 Essential (primary) hypertension: Secondary | ICD-10-CM

## 2018-10-29 DIAGNOSIS — F411 Generalized anxiety disorder: Secondary | ICD-10-CM

## 2018-10-29 DIAGNOSIS — Z23 Encounter for immunization: Secondary | ICD-10-CM

## 2018-10-29 NOTE — Patient Instructions (Addendum)
Let me know when/if you need refills.   Keep the diet clean and stay active.  Let us know if you need anything.

## 2018-10-29 NOTE — Progress Notes (Signed)
Pre visit review using our clinic review tool, if applicable. No additional management support is needed unless otherwise documented below in the visit note. 

## 2018-10-29 NOTE — Progress Notes (Signed)
Chief Complaint  Patient presents with  . Follow-up    Subjective Alec Taylor is a 72 y.o. male who presents for hypertension follow up. He does monitor home blood pressures. Blood pressures ranging from 110-120's/50-60's on average. He is compliant with medications- Coreg 6.25 mg bid, Entresto, Aldactone 12.5 mg/d. Patient has these side effects of medication: none He is adhering to a healthy diet overall. Current exercise: none  Hx of depression. Mood is doing well. On Cymbalta 60 mg/d. Is following with a Veterinary surgeon. Daughter is doing better, stress less so. Work is stressful for him. No SI or HI.    Past Medical History:  Diagnosis Date  . CAD (coronary artery disease) 09/2007   s/p CABG approximately 2004-[facility] (Cath - 17 Sep 2007:90% prox LAD with diffuse 85% narrowing to distal segment, 80% prox CXA, RCA: prox 90%, prox-mid 70%, mid 85%, distal 80%, D1 80%; Normal EF 65% --status post CABG--LIMA to LAD, SVG to PDA.    [name] COPD (chronic obstructive pulmonary disease) (HCC)   . Depression   . Diabetes mellitus without complication (HCC)   . Hypertension   . Ischemic cardiomyopathy   . Pleural effusion   . S/P CABG x 2    CABG--LIMA to LAD, SVG to PDA.      Review of Systems Cardiovascular: no chest pain Respiratory:  no shortness of breath  Exam BP 118/68 (BP Location: Left Arm, Patient Position: Sitting, Cuff Size: Normal)   Pulse 92   Temp 98.1 F (36.7 C) (Oral)   Ht 5\' 10"  (1.778 m)   Wt 185 lb 2 oz (84 kg)   SpO2 95%   BMI 26.56 kg/m  General:  well developed, well nourished, in no apparent distress Heart: RRR, no bruits, no LE edema Lungs: clear to auscultation, no accessory muscle use Psych: well oriented with normal range of affect and appropriate judgment/insight  Essential (primary) hypertension  Generalized anxiety disorder  Cont meds. Flu shot today. Counseled on diet and exercise. F/u in 6 mo for CPE or prn. The patient voiced understanding and  agreement to the plan.  [place], DO 10/29/18  8:54 AM

## 2018-10-29 NOTE — Addendum Note (Signed)
Addended by: Scharlene Gloss B on: 10/29/2018 08:59 AM   Modules accepted: Orders

## 2018-10-30 DIAGNOSIS — H35373 Puckering of macula, bilateral: Secondary | ICD-10-CM | POA: Diagnosis not present

## 2018-10-30 DIAGNOSIS — Z961 Presence of intraocular lens: Secondary | ICD-10-CM | POA: Diagnosis not present

## 2018-10-30 DIAGNOSIS — T378X5D Adverse effect of other specified systemic anti-infectives and antiparasitics, subsequent encounter: Secondary | ICD-10-CM | POA: Diagnosis not present

## 2018-10-30 DIAGNOSIS — E113512 Type 2 diabetes mellitus with proliferative diabetic retinopathy with macular edema, left eye: Secondary | ICD-10-CM | POA: Diagnosis not present

## 2018-10-30 DIAGNOSIS — E113513 Type 2 diabetes mellitus with proliferative diabetic retinopathy with macular edema, bilateral: Secondary | ICD-10-CM | POA: Diagnosis not present

## 2018-10-31 DIAGNOSIS — N289 Disorder of kidney and ureter, unspecified: Secondary | ICD-10-CM | POA: Diagnosis not present

## 2018-10-31 DIAGNOSIS — N201 Calculus of ureter: Secondary | ICD-10-CM | POA: Diagnosis not present

## 2018-11-10 ENCOUNTER — Other Ambulatory Visit: Payer: Self-pay | Admitting: Medical

## 2018-11-11 ENCOUNTER — Other Ambulatory Visit (HOSPITAL_COMMUNITY): Payer: Self-pay | Admitting: Adult Health

## 2018-11-18 ENCOUNTER — Other Ambulatory Visit: Payer: Self-pay | Admitting: Psychiatry

## 2018-12-02 DIAGNOSIS — Z79899 Other long term (current) drug therapy: Secondary | ICD-10-CM | POA: Diagnosis not present

## 2018-12-02 DIAGNOSIS — L639 Alopecia areata, unspecified: Secondary | ICD-10-CM | POA: Diagnosis not present

## 2018-12-15 ENCOUNTER — Ambulatory Visit (HOSPITAL_COMMUNITY)
Admission: RE | Admit: 2018-12-15 | Discharge: 2018-12-15 | Disposition: A | Discharge status: Home / Self Care | Payer: BLUE CROSS/BLUE SHIELD | Source: Ambulatory Visit | Attending: Cardiology | Admitting: Cardiology

## 2018-12-15 ENCOUNTER — Ambulatory Visit (HOSPITAL_BASED_OUTPATIENT_CLINIC_OR_DEPARTMENT_OTHER)
Admission: RE | Admit: 2018-12-15 | Discharge: 2018-12-15 | Disposition: A | Discharge status: Home / Self Care | Payer: BLUE CROSS/BLUE SHIELD | Source: Ambulatory Visit | Attending: Internal Medicine | Admitting: Internal Medicine

## 2018-12-15 ENCOUNTER — Encounter (HOSPITAL_COMMUNITY): Payer: Self-pay | Admitting: Internal Medicine

## 2018-12-15 VITALS — BP 132/70 | HR 81 | Wt 185.6 lb

## 2018-12-15 DIAGNOSIS — Z951 Presence of aortocoronary bypass graft: Secondary | ICD-10-CM | POA: Diagnosis not present

## 2018-12-15 DIAGNOSIS — Z9884 Bariatric surgery status: Secondary | ICD-10-CM | POA: Diagnosis not present

## 2018-12-15 DIAGNOSIS — E119 Type 2 diabetes mellitus without complications: Secondary | ICD-10-CM | POA: Diagnosis not present

## 2018-12-15 DIAGNOSIS — Z79899 Other long term (current) drug therapy: Secondary | ICD-10-CM | POA: Diagnosis not present

## 2018-12-15 DIAGNOSIS — D508 Other iron deficiency anemias: Secondary | ICD-10-CM | POA: Diagnosis not present

## 2018-12-15 DIAGNOSIS — D649 Anemia, unspecified: Secondary | ICD-10-CM | POA: Insufficient documentation

## 2018-12-15 DIAGNOSIS — J90 Pleural effusion, not elsewhere classified: Secondary | ICD-10-CM | POA: Insufficient documentation

## 2018-12-15 DIAGNOSIS — Z8249 Family history of ischemic heart disease and other diseases of the circulatory system: Secondary | ICD-10-CM | POA: Diagnosis not present

## 2018-12-15 DIAGNOSIS — J449 Chronic obstructive pulmonary disease, unspecified: Secondary | ICD-10-CM | POA: Insufficient documentation

## 2018-12-15 DIAGNOSIS — I255 Ischemic cardiomyopathy: Secondary | ICD-10-CM | POA: Diagnosis not present

## 2018-12-15 DIAGNOSIS — Z7982 Long term (current) use of aspirin: Secondary | ICD-10-CM | POA: Diagnosis not present

## 2018-12-15 DIAGNOSIS — Z7984 Long term (current) use of oral hypoglycemic drugs: Secondary | ICD-10-CM | POA: Diagnosis not present

## 2018-12-15 DIAGNOSIS — E785 Hyperlipidemia, unspecified: Secondary | ICD-10-CM | POA: Insufficient documentation

## 2018-12-15 DIAGNOSIS — I110 Hypertensive heart disease with heart failure: Secondary | ICD-10-CM | POA: Diagnosis not present

## 2018-12-15 DIAGNOSIS — I2510 Atherosclerotic heart disease of native coronary artery without angina pectoris: Secondary | ICD-10-CM | POA: Diagnosis not present

## 2018-12-15 DIAGNOSIS — I5022 Chronic systolic (congestive) heart failure: Secondary | ICD-10-CM | POA: Diagnosis not present

## 2018-12-15 LAB — CBC
HEMATOCRIT: 38.6 % — AB (ref 39.0–52.0)
HEMOGLOBIN: 12.2 g/dL — AB (ref 13.0–17.0)
MCH: 28.5 pg (ref 26.0–34.0)
MCHC: 31.6 g/dL (ref 30.0–36.0)
MCV: 90.2 fL (ref 80.0–100.0)
NRBC: 0 % (ref 0.0–0.2)
Platelets: 214 10*3/uL (ref 150–400)
RBC: 4.28 MIL/uL (ref 4.22–5.81)
RDW: 12.4 % (ref 11.5–15.5)
WBC: 7.8 10*3/uL (ref 4.0–10.5)

## 2018-12-15 LAB — BRAIN NATRIURETIC PEPTIDE: B Natriuretic Peptide: 210.3 pg/mL — ABNORMAL HIGH (ref 0.0–100.0)

## 2018-12-15 LAB — BASIC METABOLIC PANEL
ANION GAP: 8 (ref 5–15)
BUN: 32 mg/dL — ABNORMAL HIGH (ref 8–23)
CALCIUM: 9.2 mg/dL (ref 8.9–10.3)
CHLORIDE: 101 mmol/L (ref 98–111)
CO2: 24 mmol/L (ref 22–32)
Creatinine, Ser: 1.01 mg/dL (ref 0.61–1.24)
GFR calc non Af Amer: >60 mL/min (ref >60)
GLUCOSE: 189 mg/dL — AB (ref 70–99)
Potassium: 4.5 mmol/L (ref 3.5–5.1)
Sodium: 133 mmol/L — ABNORMAL LOW (ref 135–145)

## 2018-12-15 MED ORDER — EMPAGLIFLOZIN 10 MG PO TABS
10.0000 mg | ORAL_TABLET | Freq: Every day | ORAL | 6 refills | Status: DC
Start: 2018-12-15 — End: 2019-06-30

## 2018-12-15 NOTE — Addendum Note (Signed)
Encounter addended by: Shea Stakes, RN on: 12/15/2018 10:45 AM  Actions taken: Follow-up modified, Diagnosis association updated, Pharmacy for encounter modified, Order list changed, Clinical Note Signed

## 2018-12-15 NOTE — Patient Instructions (Signed)
Labs were done.  Take Jardiance 10mg  (1 Tab) daily.  Your physician recommends that you schedule a follow-up appointment in 6 months

## 2018-12-15 NOTE — Progress Notes (Signed)
Advanced Heart Failure Clinic Note   Referring Physician: Dr Excell Seltzer Primary Care: Dr Derrell Lolling Primary [facility] Cardiologist: Dr Beverely Pace   HPI: Alec Taylor is a 73 y.o. male with history of CAD s/p CABG 2004 at [facility], HTN, hyperlipidemia, DMII, obesity s/p gastric bypass and systolic HF.   Echocardiogram 12/2017  revealed LVEF of 45 to 50%.   Admitted 04/28/18 with increased dyspnea. CTA negative for PE. ECHO performed and showed reduced EF 35-40%. He underwent LHC . Stable coronary disease and grafts. Diuresed well. Discharge weight was 177 pounds.   Today he returns for HF follow up. Overall feeling ok. Wife got him an Air-Dyne for Xmas and doing 20 mins on it. No CP or undue SOB. Can do all ADLs without any problems. Feeling much better. No problems with medicines. No dizziness.   Echo today EF 40-45%   CMRI 8/14  1. Normal left ventricular size with mild concentric hypertrophy and severely decreased systolic function (LVEF = 38%) with diffuse hypokinesis. There is pericardial late gadolinium enhancement in the basal and mid inferior walls. 2. Normal right ventricular size, thickness and borderline systolic function (LVEF = 45%). There are no regional wall motion abnormalities. 3.  Moderately dilated left atrium, mildly dilated right atrium. 4. Normal size of the aortic root, ascending aorta. Mildly dilated pulmonary artery measuring 31 mm. 5.  Mild aortic, mitral and trivial tricuspid regurgitation. 6.  Normal pericardium.  No pericardial effusion  ECHO 04/29/2018  Left ventricle: The cavity size was normal. Wall thickness was   normal. Systolic function was moderately reduced. The estimated   ejection fraction was in the range of 35% to 40%. Diffuse   hypokinesis. The study is not technically sufficient to allow   evaluation of LV diastolic function. - Aortic valve: There was mild regurgitation. - Mitral valve: Severely calcified annulus. There was mild    regurgitation. - Left atrium: The atrium was mildly dilated. - Tricuspid valve: There was trivial regurgitation  LHC 04/30/2018   Prox RCA to Mid RCA lesion is 65% stenosed.  Dist RCA-1 lesion is 85% stenosed. Dist RCA-2 lesion is 95% stenosed with 80% stenosed side branch in Post Atrio.  SVG-rPDA graft was visualized by angiography and is normal in caliber and large. The graft exhibits no disease.  Ost RPDA lesion is 45% stenosed. Ost RPDA to prox RPDA lesion is 90% stenosed - This limits flow retrograde to the RPAV-RPL.  Ost LAD lesion is 55% stenosed.  Ost 1st Diag (small caliber vessel) lesion is 70% stenosed.  Prox LAD lesion is 100% stenosed.  LIMA-LAD graft is large caliber, widely patent perfusing a small caliber distal LAD  Prox Cx to Mid Cx stent is 25% stenosed.  Dist Cx lesion is 65% stenosed. Ost 3rd Mrg to 3rd Mrg lesion is 70% stenosed. -Both limbs of the distal bifurcation.  There is severe left ventricular systolic dysfunction. The left ventricular ejection fraction is less than 25% by visual estimate.  LV end diastolic pressure is moderately elevated.  Significant aortic and mitral valve calcification.  Calcified pericardium  Review of systems complete and found to be negative unless listed in HPI.   Past Medical History:  Diagnosis Date  . CAD (coronary artery disease) 09/2007   s/p CABG approximately 2004-[facility] (Cath - 17 Sep 2007:90% prox LAD with diffuse 85% narrowing to distal segment, 80% prox CXA, RCA: prox 90%, prox-mid 70%, mid 85%, distal 80%, D1 80%; Normal EF 65% --status post CABG--LIMA to LAD, SVG to PDA.    [name]  COPD (chronic obstructive pulmonary disease) (HCC)   . Depression   . Diabetes mellitus without complication (HCC)   . Hypertension   . Ischemic cardiomyopathy   . Pleural effusion   . S/P CABG x 2    CABG--LIMA to LAD, SVG to PDA.     Current Outpatient Medications  Medication Sig Dispense Refill  . ALPRAZolam (XANAX) 0.25 MG  tablet TAKE 1/2 TO 1 TABLET BY MOUTH ONE TIME DAILY AS NEEDED FOR ANXIETY 30 tablet 1  . ammonium lactate (LAC-HYDRIN) 12 % lotion Apply topically 2 (two) times daily as needed. To feet  5  . aspirin EC 81 MG tablet Take 81 mg by mouth daily.    [name]. atorvastatin (LIPITOR) 20 MG tablet Take 20 mg by mouth daily.  3  . buPROPion (WELLBUTRIN XL) 150 MG 24 hr tablet Take 150 mg by mouth every morning.    . busPIRone (BUSPAR) 15 MG tablet Take 15 mg by mouth 2 (two) times daily.     . carvedilol (COREG) 6.25 MG tablet Take 1 tablet (6.25 mg total) by mouth 2 (two) times daily with a meal. 60 tablet 6  . Cholecalciferol (VITAMIN D3) 5000 units CAPS Take 5,000 Units by mouth 2 (two) times daily.    [name]. donepezil (ARICEPT) 5 MG tablet TAKE ONE TABLET BY MOUTH ONE TIME DAILY 90 tablet 1  . DULoxetine (CYMBALTA) 60 MG capsule Take 60 mg by mouth every morning.     [name]. ENTRESTO 49-51 MG TAKE 1 TABLET BY MOUTH TWICE A DAY 60 tablet 11  . finasteride (PROSCAR) 5 MG tablet Take 5 mg by mouth every evening.   6  . folic acid (FOLVITE) 1 MG tablet Take 1 mg by mouth daily.  2  . glimepiride (AMARYL) 2 MG tablet Take 2 mg by mouth every evening.  1  . metFORMIN (GLUCOPHAGE-XR) 500 MG 24 hr tablet Take 500 mg by mouth 2 (two) times daily.  1  . methotrexate (RHEUMATREX) 2.5 MG tablet Take 12.5 mg by mouth once a week. On Wednesday  3  . Multiple Vitamins-Minerals (MULTIVITAMIN ADULT PO) Take 1 tablet by mouth every morning.    [name]. ofloxacin (OCUFLOX) 0.3 % ophthalmic solution Place 1 drop into both eyes 4 (four) times daily. Prior to procedures.    . pantoprazole (PROTONIX) 40 MG tablet Take 40 mg by mouth 2 (two) times daily.  0  . saccharomyces boulardii ([employer] DIGESTIVE PROBIOTIC) 250 MG capsule Take 250 mg by mouth every morning.    [name]. spironolactone (ALDACTONE) 25 MG tablet Take 0.5 tablets (12.5 mg total) by mouth daily. 15 tablet 6  . zolpidem (AMBIEN CR) 12.5 MG CR tablet Take 12.5 mg by mouth at bedtime as needed.      No current facility-administered medications for this encounter.    Allergies  Allergen Reactions  . Celecoxib     hyperkalemia  . Losartan     hyperkalemia  . Ramipril     Cough    Social History   Socioeconomic History  . Marital status: Legally Separated    Spouse name: Not on file  . Number of children: Not on file  . Years of education: Not on file  . Highest education level: Not on file  Occupational History  . Not on file  Social Needs  . Financial resource strain: Not on file  . Food insecurity:    Worry: Not on file    Inability: Not on file  . Transportation needs:  Medical: Not on file    Non-medical: Not on file  Tobacco Use  . Smoking status: Never Smoker  . Smokeless tobacco: Never Used  Substance and Sexual Activity  . Alcohol use: Yes    Comment: occ  . Drug use: Never  . Sexual activity: Not on file  Lifestyle  . Physical activity:    Days per week: Not on file    Minutes per session: Not on file  . Stress: Not on file  Relationships  . Social connections:    Talks on phone: Not on file    Gets together: Not on file    Attends religious service: Not on file    Active member of club or organization: Not on file    Attends meetings of clubs or organizations: Not on file    Relationship status: Not on file  . Intimate partner violence:    Fear of current or ex partner: Not on file    Emotionally abused: Not on file    Physically abused: Not on file    Forced sexual activity: Not on file  Other Topics Concern  . Not on file  Social History Narrative  . Not on file    Family History  Problem Relation Age of Onset  . Hypertension Father    Vitals:   12/15/18 0953  BP: 132/70  Pulse: 81  SpO2: 98%  Weight: 84.2 kg (185 lb 9.6 oz)     Wt Readings from Last 3 Encounters:  12/15/18 84.2 kg (185 lb 9.6 oz)  10/29/18 84 kg (185 lb 2 oz)  10/08/18 86.5 kg (190 lb 9.6 oz)     PHYSICAL EXAM: General:  Well appearing. No resp  difficulty HEENT: normal Neck: supple. no JVD. Carotids 2+ bilat; no bruits. No lymphadenopathy or thryomegaly appreciated. Cor: PMI nondisplaced. Regular rate & rhythm. No rubs, gallops or murmurs. Lungs: clear Abdomen: soft, nontender, nondistended. No hepatosplenomegaly. No bruits or masses. Good bowel sounds. Extremities: no cyanosis, clubbing, rash, edema Neuro: alert & orientedx3, cranial nerves grossly intact. moves all 4 extremities w/o difficulty. Affect pleasant    ASSESSMENT & PLAN:  1. Chronic Systolic Heart Failure due to ICM EF has dropped from 45-50% in January of this year to 35-40%.  ECHO 04/2018 EF 35-40%. ?Viral versus PVC CMRI 07/2018 EF 38% pericardial late gadolinium enhancement in the basal and mid inferior walls. Suspicious for recent myocarditis.  - Echo today viewed personally and compared to previous. EF improved 40-45% - NYHA II. Volume status stable on exam. Continue lasix as needed. Takes ~2x/week. May need less with addition of Jardiance.  - Continue coreg 6.25 mg BID - Continue Entresto 49/51 mg BID.  - Add Jardiance - Continue spironolactone 12.5 mg daily. K 4.8 07/08/18  2. CAD S/P CABG  - No s/s ischemia. - LHC 04/30/2018 severe multivessel disease and patent LIMA-LAD SVG-PDA - Continue statin, BB, and ASA  3. Anemia - Iron stores low 05/28/18. Has received feraheme. - Hemoglobin stable 11.7 09/02/18. Will recheck  4. DMII  - hgb A1C 6.8 in 5/19  - Follows with Dr. Ocie CornfieldMonica Doerr (Endo) - Will add Jardiance 10   5. PVCs - Occasional on previous EKG - Have discussed holter monitor.  - No palpitations.    Arvilla Meresaniel Ido Wollman, MD  10:01 AM

## 2018-12-15 NOTE — Progress Notes (Signed)
  Echocardiogram 2D Echocardiogram has been performed.  Stephane Niemann L Androw 12/15/2018, 9:52 AM

## 2018-12-23 DIAGNOSIS — E113513 Type 2 diabetes mellitus with proliferative diabetic retinopathy with macular edema, bilateral: Secondary | ICD-10-CM | POA: Diagnosis not present

## 2018-12-23 DIAGNOSIS — H4312 Vitreous hemorrhage, left eye: Secondary | ICD-10-CM | POA: Diagnosis not present

## 2018-12-23 DIAGNOSIS — T378X5D Adverse effect of other specified systemic anti-infectives and antiparasitics, subsequent encounter: Secondary | ICD-10-CM | POA: Diagnosis not present

## 2018-12-23 DIAGNOSIS — H35373 Puckering of macula, bilateral: Secondary | ICD-10-CM | POA: Diagnosis not present

## 2018-12-25 DIAGNOSIS — L97522 Non-pressure chronic ulcer of other part of left foot with fat layer exposed: Secondary | ICD-10-CM | POA: Diagnosis not present

## 2018-12-25 DIAGNOSIS — E11621 Type 2 diabetes mellitus with foot ulcer: Secondary | ICD-10-CM | POA: Diagnosis not present

## 2018-12-25 DIAGNOSIS — Z7984 Long term (current) use of oral hypoglycemic drugs: Secondary | ICD-10-CM | POA: Diagnosis not present

## 2018-12-29 ENCOUNTER — Ambulatory Visit: Payer: BLUE CROSS/BLUE SHIELD | Admitting: Psychiatry

## 2019-01-01 DIAGNOSIS — M65331 Trigger finger, right middle finger: Secondary | ICD-10-CM | POA: Diagnosis not present

## 2019-01-01 DIAGNOSIS — M65332 Trigger finger, left middle finger: Secondary | ICD-10-CM | POA: Diagnosis not present

## 2019-01-13 DIAGNOSIS — Z7984 Long term (current) use of oral hypoglycemic drugs: Secondary | ICD-10-CM | POA: Diagnosis not present

## 2019-01-13 DIAGNOSIS — E1142 Type 2 diabetes mellitus with diabetic polyneuropathy: Secondary | ICD-10-CM | POA: Diagnosis not present

## 2019-01-13 DIAGNOSIS — L97522 Non-pressure chronic ulcer of other part of left foot with fat layer exposed: Secondary | ICD-10-CM | POA: Diagnosis not present

## 2019-01-13 DIAGNOSIS — E11621 Type 2 diabetes mellitus with foot ulcer: Secondary | ICD-10-CM | POA: Diagnosis not present

## 2019-01-19 ENCOUNTER — Ambulatory Visit (INDEPENDENT_AMBULATORY_CARE_PROVIDER_SITE_OTHER): Payer: BLUE CROSS/BLUE SHIELD | Admitting: Psychiatry

## 2019-01-19 ENCOUNTER — Encounter: Payer: Self-pay | Admitting: Psychiatry

## 2019-01-19 VITALS — BP 112/53 | HR 80

## 2019-01-19 DIAGNOSIS — F411 Generalized anxiety disorder: Secondary | ICD-10-CM

## 2019-01-19 DIAGNOSIS — F5101 Primary insomnia: Secondary | ICD-10-CM | POA: Diagnosis not present

## 2019-01-19 DIAGNOSIS — F3341 Major depressive disorder, recurrent, in partial remission: Secondary | ICD-10-CM | POA: Diagnosis not present

## 2019-01-19 MED ORDER — ZOLPIDEM TARTRATE ER 6.25 MG PO TBCR: 6.2500 mg | EXTENDED_RELEASE_TABLET | Freq: Every evening | ORAL | 3 refills | Status: DC | PRN

## 2019-01-19 MED ORDER — DONEPEZIL HCL 5 MG PO TABS: 5.0000 mg | ORAL_TABLET | Freq: Every day | ORAL | 1 refills | Status: DC

## 2019-01-19 MED ORDER — BUSPIRONE HCL 15 MG PO TABS: 15.0000 mg | ORAL_TABLET | Freq: Two times a day (BID) | ORAL | 1 refills | Status: DC

## 2019-01-19 MED ORDER — DULOXETINE HCL 60 MG PO CPEP
60.0000 mg | ORAL_CAPSULE | ORAL | 1 refills | Status: DC
Start: 2019-01-19 — End: 2019-06-01

## 2019-01-19 MED ORDER — BUPROPION HCL ER (XL) 150 MG PO TB24: 150.0000 mg | ORAL_TABLET | ORAL | 1 refills | Status: DC

## 2019-01-19 NOTE — Progress Notes (Signed)
Alec Taylor [id number] 12-24-1945 73 y.o.  Subjective:   Patient ID:  Alec Taylor is a 73 y.o. (DOB September 29, 1946) male.  Chief Complaint:  Chief Complaint  Patient presents with  . Follow-up    h/o depression, anxiety, and insomnia    HPI Alec Taylor presents to the office today for follow-up of depression, anxiety, and insomnia. He reports that his mood has been stable. He reports some difficulty "getting up and getting going." Energy and motivation have been low. Denies any worsening depression and reports occasional sadness in response to health issues and limited mobility. He reports that he "can't really get excited about anything, nothing to look forward to." He attributes this to transition out of work. He reports that his memory and concentration has been better since coming off Klonopin. "I don't get upset anymore" and no longer has any anxiety and irritability. Reports that he has only taken Xanax prn about twice and some of this is related to decrease stress. He reports that he will have intermittent awakenings if he does not take Ambien CR. He reports that he sleeps throughout the night with Ambien CR. He reports that his appetite "goes and comes." He reports that he tries to maintain weight at 180 lbs and not overeat or eat when he is not really hungry. Reports occasional passive death wishes. Denies SI and reports that he would not want to leave his daughter.   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 Reports that his daughter is doing well and is now teaching school. Reports that daughter's health continue to improve and there has been no sign of recurrence of brain tumor.   Past Psychiatric Medication Trials: Cymbalta Aricept Wellbutrin BuSpar Ambien Ambien CR Xanax Klonopin Lamictal  Review of Systems:  Review of Systems  Eyes: Positive for visual disturbance.       Reports that he has difficulty driving at night and  on rainy days due to visual changes.   Genitourinary:       Recent kidney stone.  Musculoskeletal: Positive for arthralgias. Negative for gait problem.       Hip and toe pain  Neurological: Negative for tremors.  Psychiatric/Behavioral:       Please refer to HPI    Medications: I have reviewed the patient's current medications.  Current Outpatient Medications  Medication Sig Dispense Refill  . ALPRAZolam (XANAX) 0.25 MG tablet TAKE 1/2 TO 1 TABLET BY MOUTH ONE TIME DAILY AS NEEDED FOR ANXIETY 30 tablet 1  . ammonium lactate (LAC-HYDRIN) 12 % lotion Apply topically 2 (two) times daily as needed. To feet  5  . aspirin EC 81 MG tablet Take 81 mg by mouth daily.    [name] atorvastatin (LIPITOR) 20 MG tablet Take 20 mg by mouth daily.  3  . buPROPion (WELLBUTRIN XL) 150 MG 24 hr tablet Take 1 tablet (150 mg total) by mouth every morning. 90 tablet 1  . busPIRone (BUSPAR) 15 MG tablet Take 1 tablet (15 mg total) by mouth 2 (two) times daily. 180 tablet 1  . carvedilol (COREG) 6.25 MG tablet Take 1 tablet (6.25 mg total) by mouth 2 (two) times daily with a meal. 60 tablet 6  . Cholecalciferol (VITAMIN D3) 5000 units CAPS Take 5,000 Units by mouth 2 (two) times daily.    [name] donepezil (ARICEPT) 5 MG tablet Take 1 tablet (5 mg total) by mouth daily. 90 tablet 1  . DULoxetine (CYMBALTA) 60 MG capsule  Take 1 capsule (60 mg total) by mouth every morning. 90 capsule 1  . empagliflozin (JARDIANCE) 10 MG TABS tablet Take 10 mg by mouth daily. 30 tablet 6  . ENTRESTO 49-51 MG TAKE 1 TABLET BY MOUTH TWICE A DAY 60 tablet 11  . finasteride (PROSCAR) 5 MG tablet Take 5 mg by mouth every evening.   6  . folic acid (FOLVITE) 1 MG tablet Take 1 mg by mouth daily.  2  . glimepiride (AMARYL) 2 MG tablet Take 2 mg by mouth every evening.  1  . metFORMIN (GLUCOPHAGE-XR) 500 MG 24 hr tablet Take 500 mg by mouth 2 (two) times daily.  1  . methotrexate (RHEUMATREX) 2.5 MG tablet Take 12.5 mg by mouth once a week. On  Wednesday  3  . Multiple Vitamins-Minerals (MULTIVITAMIN ADULT PO) Take 1 tablet by mouth every morning.    [name] ofloxacin (OCUFLOX) 0.3 % ophthalmic solution Place 1 drop into both eyes 4 (four) times daily. Prior to procedures.    . pantoprazole (PROTONIX) 40 MG tablet Take 40 mg by mouth 2 (two) times daily.  0  . saccharomyces boulardii ([employer] DIGESTIVE PROBIOTIC) 250 MG capsule Take 250 mg by mouth every morning.    [name] spironolactone (ALDACTONE) 25 MG tablet Take 0.5 tablets (12.5 mg total) by mouth daily. 15 tablet 6  . zolpidem (AMBIEN CR) 6.25 MG CR tablet Take 1 tablet (6.25 mg total) by mouth at bedtime as needed for up to 30 days for sleep. 30 tablet 3   No current facility-administered medications for this visit.     Medication Side Effects: None  Allergies:  Allergies  Allergen Reactions  . Celecoxib     hyperkalemia  . Losartan     hyperkalemia  . Ramipril     Cough     Past Medical History:  Diagnosis Date  . CAD (coronary artery disease) 09/2007   s/p CABG approximately 2004-[facility] (Cath - 17 Sep 2007:90% prox LAD with diffuse 85% narrowing to distal segment, 80% prox CXA, RCA: prox 90%, prox-mid 70%, mid 85%, distal 80%, D1 80%; Normal EF 65% --status post CABG--LIMA to LAD, SVG to PDA.    [name] COPD (chronic obstructive pulmonary disease) (HCC)   . Depression   . Diabetes mellitus without complication (HCC)   . Hypertension   . Ischemic cardiomyopathy   . Pleural effusion   . S/P CABG x 2    CABG--LIMA to LAD, SVG to PDA.      Family History  Problem Relation Age of Onset  . Hypertension Father     Social History   Socioeconomic History  . Marital status: Legally Separated    Spouse name: Not on file  . Number of children: Not on file  . Years of education: Not on file  . Highest education level: Not on file  Occupational History  . Not on file  Social Needs  . Financial resource strain: Not on file  . Food insecurity:    Worry: Not on file    Inability:  Not on file  . Transportation needs:    Medical: Not on file    Non-medical: Not on file  Tobacco Use  . Smoking status: Never Smoker  . Smokeless tobacco: Never Used  Substance and Sexual Activity  . Alcohol use: Yes    Comment: occ  . Drug use: Never  . Sexual activity: Not on file  Lifestyle  . Physical activity:    Days per week: Not on  file    Minutes per session: Not on file  . Stress: Not on file  Relationships  . Social connections:    Talks on phone: Not on file    Gets together: Not on file    Attends religious service: Not on file    Active member of club or organization: Not on file    Attends meetings of clubs or organizations: Not on file    Relationship status: Not on file  . Intimate partner violence:    Fear of current or ex partner: Not on file    Emotionally abused: Not on file    Physically abused: Not on file    Forced sexual activity: Not on file  Other Topics Concern  . Not on file  Social History Narrative  . Not on file    Past Medical History, Surgical history, Social history, and Family history were reviewed and updated as appropriate.   Please see review of systems for further details on the patient's review from today.   Objective:   Physical Exam:  BP (!) 112/53   Pulse 80   Physical Exam Constitutional:      General: He is not in acute distress.    Appearance: He is well-developed.  Musculoskeletal:        General: No deformity.  Neurological:     Mental Status: He is alert and oriented to person, place, and time.     Coordination: Coordination normal.  Psychiatric:        Attention and Perception: Attention and perception normal. He does not perceive auditory or visual hallucinations.        Mood and Affect: Mood normal. Mood is not anxious or depressed. Affect is not labile, blunt, angry or inappropriate.        Speech: Speech normal.        Behavior: Behavior normal.        Thought Content: Thought content normal. Thought  content does not include homicidal or suicidal ideation. Thought content does not include homicidal or suicidal plan.        Cognition and Memory: Cognition and memory normal.        Judgment: Judgment normal.     Comments: Insight intact. No delusions.      Lab Review:     Component Value Date/Time   NA 133 (L) 12/15/2018 1021   NA 140 07/08/2018 0926   K 4.5 12/15/2018 1021   CL 101 12/15/2018 1021   CO2 24 12/15/2018 1021   GLUCOSE 189 (H) 12/15/2018 1021   BUN 32 (H) 12/15/2018 1021   BUN 27 07/08/2018 0926   CREATININE 1.01 12/15/2018 1021   CALCIUM 9.2 12/15/2018 1021   PROT 7.1 04/29/2018 0119   ALBUMIN 3.6 04/29/2018 0119   AST 34 04/29/2018 0119   ALT 36 04/29/2018 0119   ALKPHOS 90 04/29/2018 0119   BILITOT 0.9 04/29/2018 0119   GFRNONAA >60 12/15/2018 1021   GFRAA >60 12/15/2018 1021       Component Value Date/Time   WBC 7.8 12/15/2018 1021   RBC 4.28 12/15/2018 1021   HGB 12.2 (L) 12/15/2018 1021   HCT 38.6 (L) 12/15/2018 1021   PLT 214 12/15/2018 1021   MCV 90.2 12/15/2018 1021   MCH 28.5 12/15/2018 1021   MCHC 31.6 12/15/2018 1021   RDW 12.4 12/15/2018 1021   LYMPHSABS 1.2 09/02/2018 1020   MONOABS 0.5 09/02/2018 1020   EOSABS 0.2 09/02/2018 1020   BASOSABS 0.0 09/02/2018 1020  No results found for: POCLITH, LITHIUM   No results found for: PHENYTOIN, PHENOBARB, VALPROATE, CBMZ   .res Assessment: Plan:   Discussed potential benefits, risks, and side effects of Ambien CR and that a dose reduction was recommended since typically lower dose Ambien CR is recommended in adults over age 71 and that lower dose may potentially improve cognition and energy.  Patient agrees to trial of dose reduction.  Will decrease Ambien CR to 6.25 mg po QHS.  Recommended patient call office if he experiences worsening insomnia after dose reduction.  Will continue Wellbutrin XL 150 mg daily for depression Will continue Cymbalta 60 mg in the morning for anxiety and  depression. Will continue Aricept 5 mg daily for cognition. Will continue Xanax 0.25 mg 1/2-1 tab daily as needed for anxiety.    Primary insomnia - Plan: zolpidem (AMBIEN CR) 6.25 MG CR tablet  Recurrent major depressive disorder, in partial remission (HCC) - Plan: buPROPion (WELLBUTRIN XL) 150 MG 24 hr tablet, DULoxetine (CYMBALTA) 60 MG capsule  Generalized anxiety disorder - Plan: DULoxetine (CYMBALTA) 60 MG capsule, busPIRone (BUSPAR) 15 MG tablet  Please see After Visit Summary for patient specific instructions.  Future Appointments  Date Time Provider Department Center  04/20/2019 10:00 AM Corie Chiquitoarter, Rekita Miotke, PMHNP [facility] None  04/27/2019 10:30 AM Carmelia RollerWendling, Jilda RocheNicholas Paul, DO [facility]    No orders of the defined types were placed in this encounter.     -------------------------------

## 2019-02-03 DIAGNOSIS — L97522 Non-pressure chronic ulcer of other part of left foot with fat layer exposed: Secondary | ICD-10-CM | POA: Diagnosis not present

## 2019-02-17 DIAGNOSIS — L97522 Non-pressure chronic ulcer of other part of left foot with fat layer exposed: Secondary | ICD-10-CM | POA: Diagnosis not present

## 2019-02-17 DIAGNOSIS — L03032 Cellulitis of left toe: Secondary | ICD-10-CM | POA: Diagnosis not present

## 2019-02-24 DIAGNOSIS — L97522 Non-pressure chronic ulcer of other part of left foot with fat layer exposed: Secondary | ICD-10-CM | POA: Diagnosis not present

## 2019-03-03 DIAGNOSIS — L97522 Non-pressure chronic ulcer of other part of left foot with fat layer exposed: Secondary | ICD-10-CM | POA: Diagnosis not present

## 2019-03-10 DIAGNOSIS — E113513 Type 2 diabetes mellitus with proliferative diabetic retinopathy with macular edema, bilateral: Secondary | ICD-10-CM | POA: Diagnosis not present

## 2019-03-10 DIAGNOSIS — H35373 Puckering of macula, bilateral: Secondary | ICD-10-CM | POA: Diagnosis not present

## 2019-03-10 DIAGNOSIS — H4312 Vitreous hemorrhage, left eye: Secondary | ICD-10-CM | POA: Diagnosis not present

## 2019-03-17 DIAGNOSIS — L97522 Non-pressure chronic ulcer of other part of left foot with fat layer exposed: Secondary | ICD-10-CM | POA: Diagnosis not present

## 2019-03-17 DIAGNOSIS — E119 Type 2 diabetes mellitus without complications: Secondary | ICD-10-CM | POA: Diagnosis not present

## 2019-03-23 DIAGNOSIS — L97522 Non-pressure chronic ulcer of other part of left foot with fat layer exposed: Secondary | ICD-10-CM | POA: Diagnosis not present

## 2019-03-23 DIAGNOSIS — L03032 Cellulitis of left toe: Secondary | ICD-10-CM | POA: Diagnosis not present

## 2019-03-23 DIAGNOSIS — I739 Peripheral vascular disease, unspecified: Secondary | ICD-10-CM | POA: Diagnosis not present

## 2019-03-26 DIAGNOSIS — M86172 Other acute osteomyelitis, left ankle and foot: Secondary | ICD-10-CM | POA: Diagnosis not present

## 2019-03-26 DIAGNOSIS — L97522 Non-pressure chronic ulcer of other part of left foot with fat layer exposed: Secondary | ICD-10-CM | POA: Diagnosis not present

## 2019-03-26 DIAGNOSIS — I96 Gangrene, not elsewhere classified: Secondary | ICD-10-CM | POA: Diagnosis not present

## 2019-03-26 DIAGNOSIS — M87078 Idiopathic aseptic necrosis of left toe(s): Secondary | ICD-10-CM | POA: Diagnosis not present

## 2019-04-08 ENCOUNTER — Other Ambulatory Visit (HOSPITAL_COMMUNITY): Payer: Self-pay | Admitting: Internal Medicine

## 2019-04-16 ENCOUNTER — Telehealth: Payer: Self-pay

## 2019-04-16 ENCOUNTER — Telehealth: Payer: Self-pay | Admitting: Cardiology

## 2019-04-16 IMAGING — CT CT ANGIO CHEST
2 of 8 series · 18 of 36 positions shown · IV contrast (iopamidol)
Comparison: CT scan of December 16, 2017.

CLINICAL DATA: Chest pain, shortness of breath.

EXAM:
CT ANGIOGRAPHY CHEST WITH CONTRAST
TECHNIQUE: Multidetector CT imaging of the chest was performed using the
standard protocol during bolus administration of intravenous
contrast. Multiplanar CT image reconstructions and MIPs were
obtained to evaluate the vascular anatomy.
CONTRAST:  100mL [id number] IOPAMIDOL ([id number]) INJECTION 76%

[Series 6: pe coronal mpr · coronal · 0.56mm/px · 1 of 155 slices shown]
[im 78/155  mediastinal]
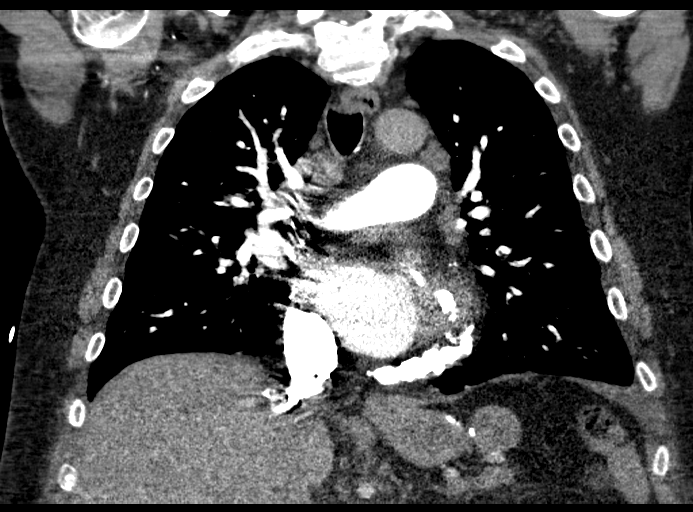

[Series 10: pe thins · axial · 0.82mm/px · z∈[-288,-40]mm · 17 of 278 slices shown]
[im 15/278  lung]
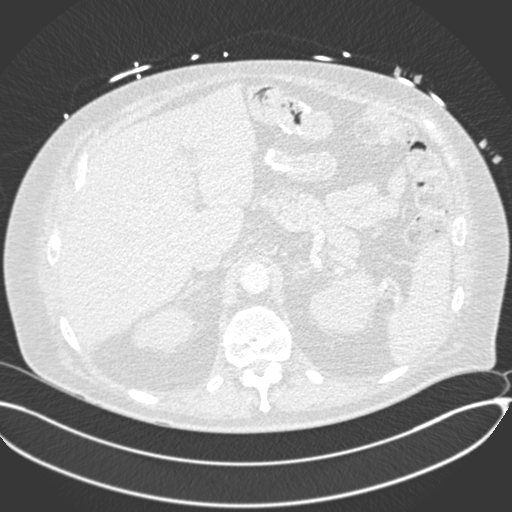
[im 30/278  mediastinal]
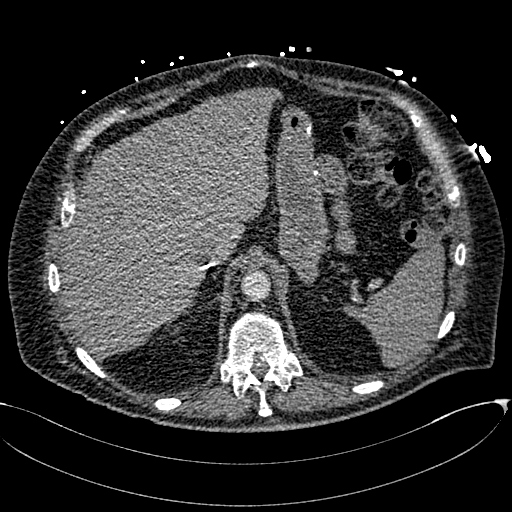
[im 44/278  lung]
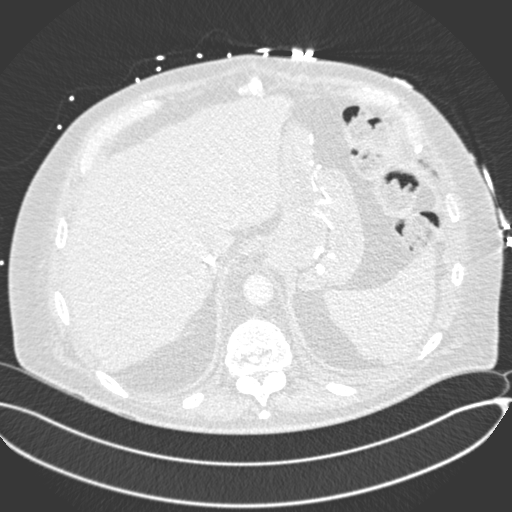
[im 59/278  mediastinal]
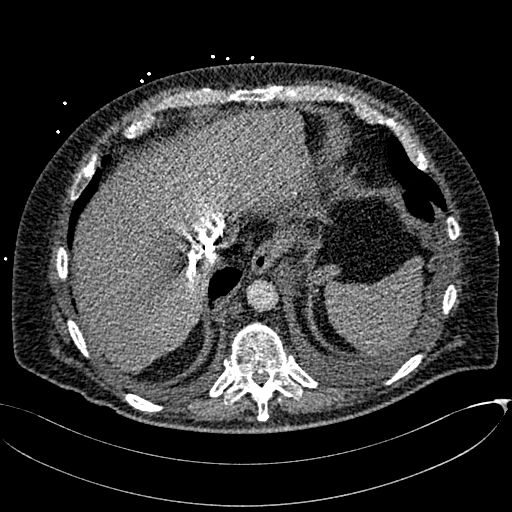
[im 73/278  lung]
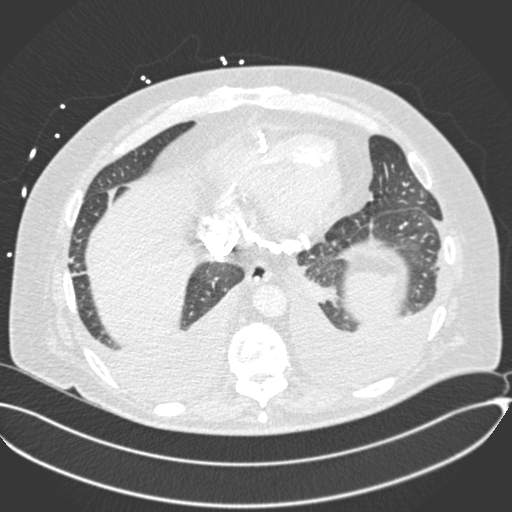
[im 88/278  mediastinal]
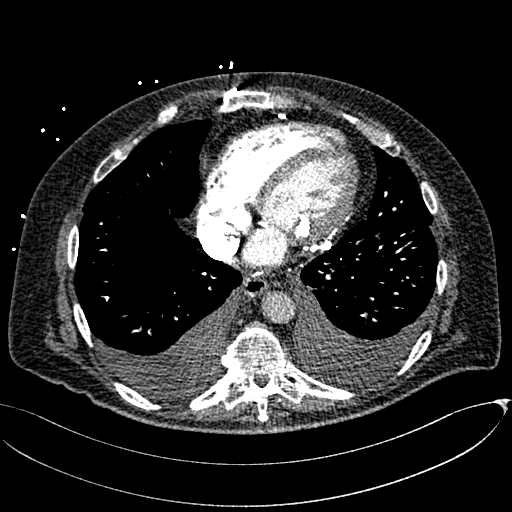
[im 103/278  lung]
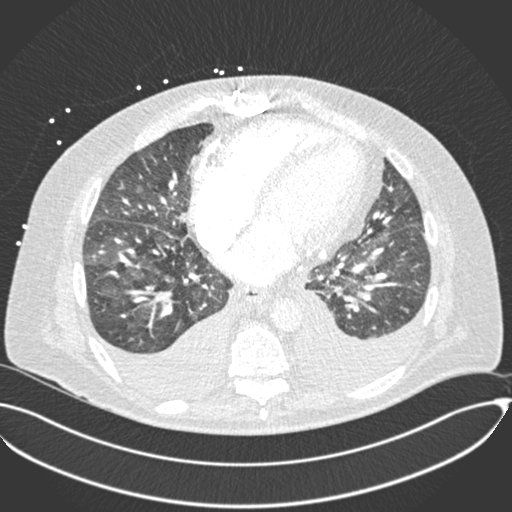
[im 117/278  mediastinal]
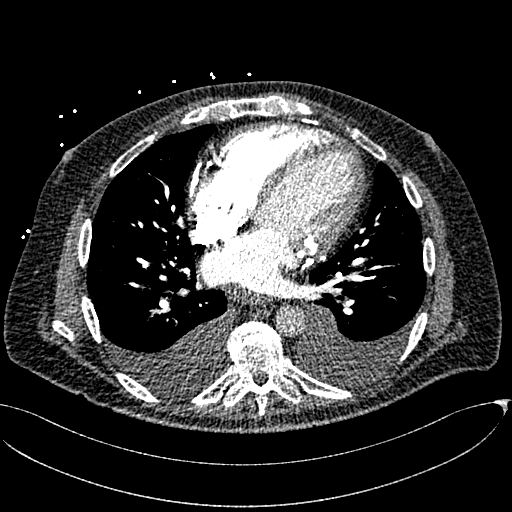
[im 146/278  lung]
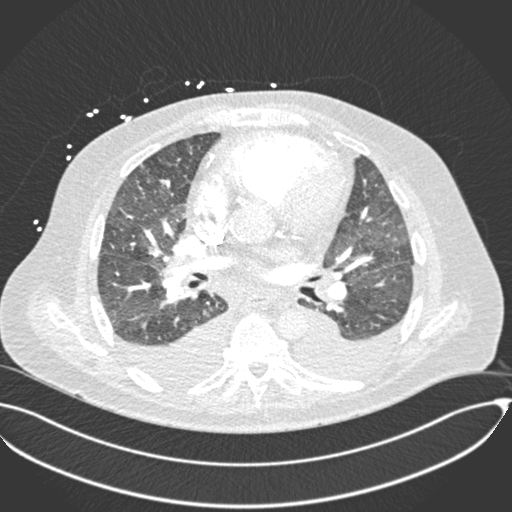
[im 161/278  mediastinal]
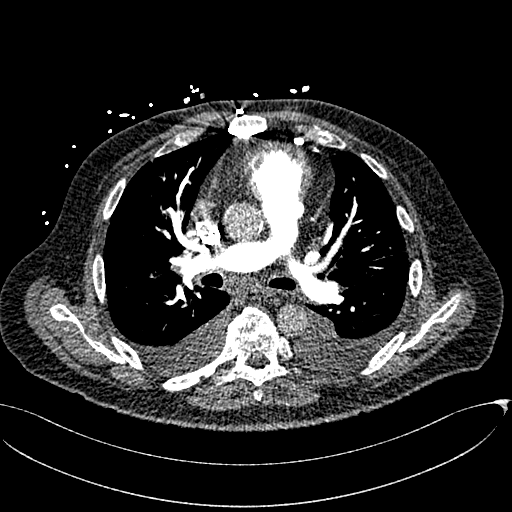
[im 175/278  lung]
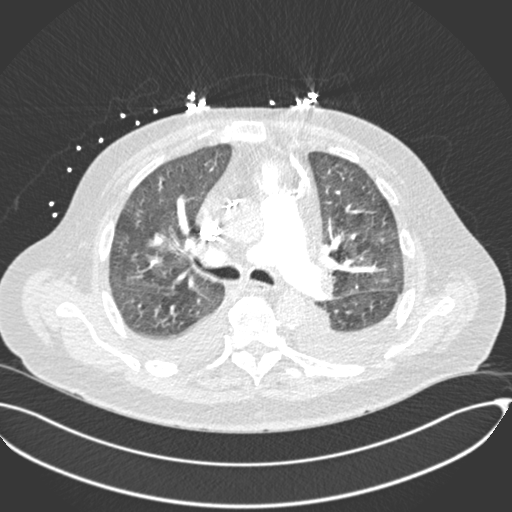
[im 190/278  mediastinal]
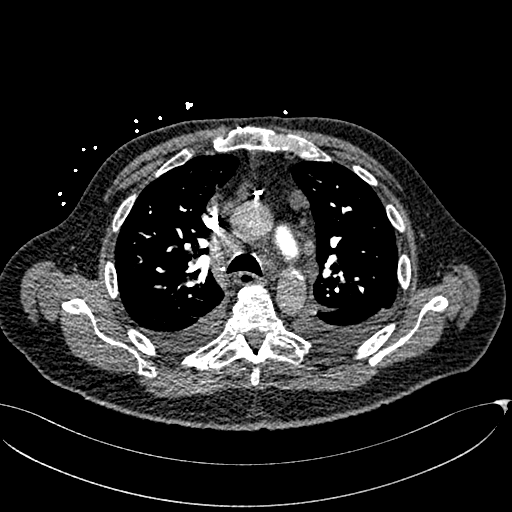
[im 205/278  lung]
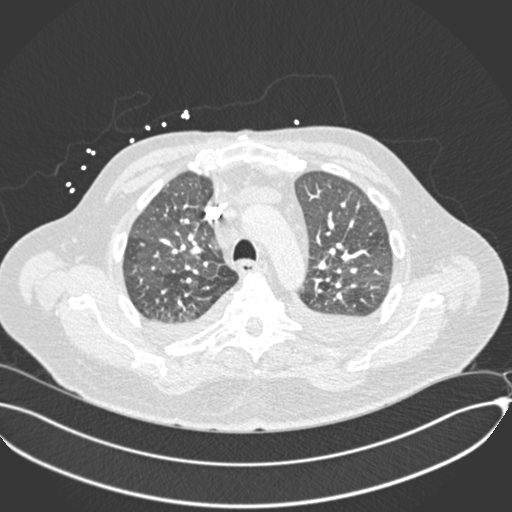
[im 219/278  mediastinal]
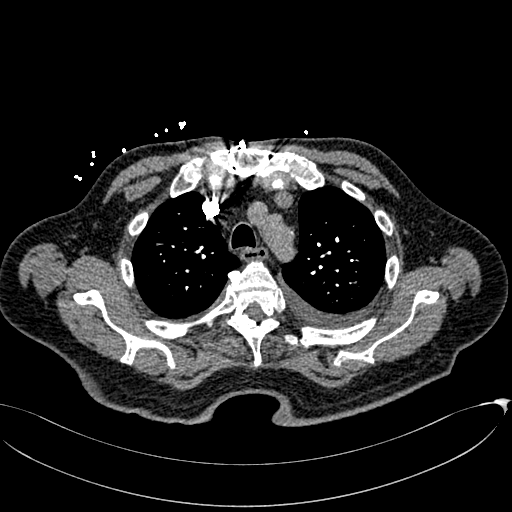
[im 234/278  lung]
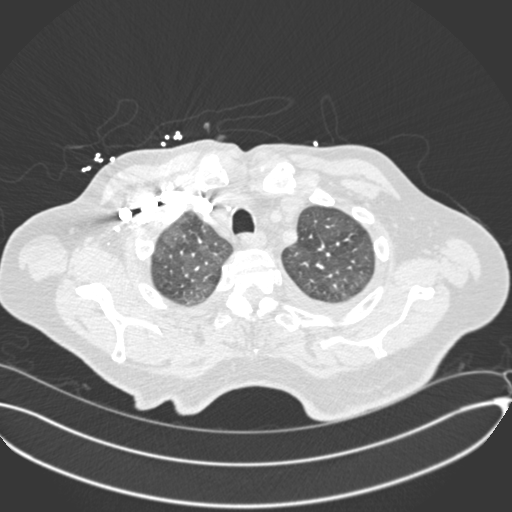
[im 248/278  mediastinal]
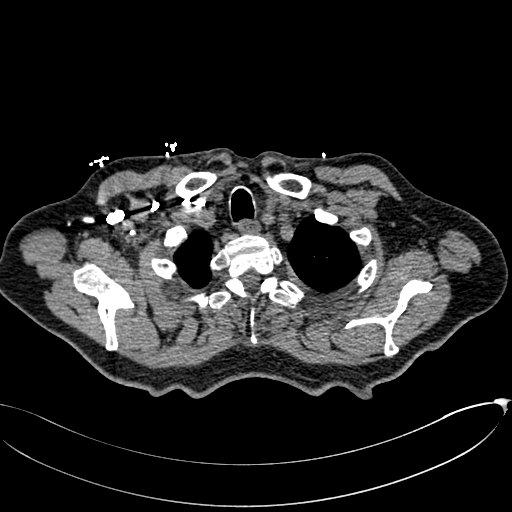
[im 263/278  lung]
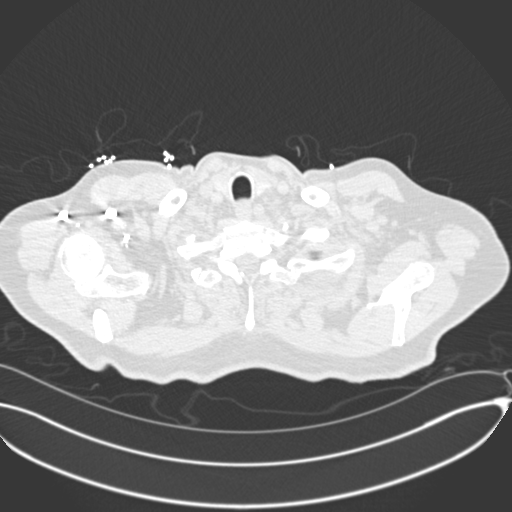

[18 of 36 positions shown; findings below may reference images not displayed]

FINDINGS: Cardiovascular: Satisfactory opacification of the pulmonary arteries
to the segmental level. No evidence of pulmonary embolism. Normal
heart size. No pericardial effusion.

Mediastinum/Nodes: Stable calcified left thyroid nodule. The
esophagus is unremarkable. Stable mediastinal adenopathy is noted
which most likely is reactive in etiology.

Lungs/Pleura: Moderate bilateral pleural effusions are noted. No
pneumothorax is noted. Mosaic airspace opacity is noted which may
represent focal air trapping or possible multifocal inflammation.

Upper Abdomen: Probable hepatic cirrhosis. Status post gastric
bypass.

Musculoskeletal: No chest wall abnormality. No acute or significant
osseous findings.

Review of the MIP images confirms the above findings.
IMPRESSION: No definite evidence of pulmonary embolus.

Moderate bilateral pleural effusions.

Stable mediastinal adenopathy is noted which most likely is reactive
in etiology.

Mosaic airspace opacity is noted throughout both lungs which may
represent focal air trapping or possibly multifocal inflammation.

Probable hepatic cirrhosis.

## 2019-04-16 NOTE — Telephone Encounter (Signed)
New message   Pt c/o medication issue:  1. Name of Medication: Entresto  24-26 mg  2. How are you currently taking this medication (dosage and times per day)? 2 times daily  3. Are you having a reaction (difficulty breathing--STAT)?n/a  4. What is your medication issue? Per Cover MY Meds need prior authorization for this medication.

## 2019-04-16 NOTE — Telephone Encounter (Signed)
I did the this Entresto PA through covermymeds and received the following message: Alec Taylor (Key: [id number])  Delene Loll 24-26MG OR TABS  Form [employer] Electronic PA Form   Determination  N/A  Message from [employer] is covered by current benefit plan. No further PA activity needed.  I have called [facility] and [facility] [place] who advised me that the pts Delene Loll is going through without a PA required but the cost to the pt is $544 for a 30 day supply and the actual cost for Entresto without insurance coverage is [phone] for a 30 day supply so it looks like the pts insurance is paying some on it. This could mean that the pt has not met her deductible. Also, Caprice Red states that the pts Vania Rea is $503 for a 30 day supply.  I will forward this message to Dr Bensimhon's nurse.

## 2019-04-20 ENCOUNTER — Other Ambulatory Visit: Payer: Self-pay

## 2019-04-20 ENCOUNTER — Encounter: Payer: Self-pay | Admitting: Psychiatry

## 2019-04-20 ENCOUNTER — Ambulatory Visit (INDEPENDENT_AMBULATORY_CARE_PROVIDER_SITE_OTHER): Payer: BLUE CROSS/BLUE SHIELD | Admitting: Psychiatry

## 2019-04-20 DIAGNOSIS — F411 Generalized anxiety disorder: Secondary | ICD-10-CM

## 2019-04-20 DIAGNOSIS — F5101 Primary insomnia: Secondary | ICD-10-CM

## 2019-04-20 DIAGNOSIS — F3341 Major depressive disorder, recurrent, in partial remission: Secondary | ICD-10-CM

## 2019-04-20 MED ORDER — ZOLPIDEM TARTRATE ER 12.5 MG PO TBCR: 12.5000 mg | EXTENDED_RELEASE_TABLET | Freq: Every evening | ORAL | 0 refills | Status: DC | PRN

## 2019-04-20 MED ORDER — BUSPIRONE HCL 15 MG PO TABS: ORAL_TABLET | ORAL | 0 refills | Status: DC

## 2019-04-20 NOTE — Progress Notes (Signed)
Alec Taylor [id number] 10/19/1946 73 y.o.  Virtual Visit via Telephone Note  I connected with pt on 04/20/19 at 10:00 AM EDT by telephone and verified that I am speaking with the correct person using two identifiers.   I discussed the limitations, risks, security and privacy concerns of performing an evaluation and management service by telephone and the availability of in person appointments. I also discussed with the patient that there may be a patient responsible charge related to this service. The patient expressed understanding and agreed to proceed.   I discussed the assessment and treatment plan with the patient. The patient was provided an opportunity to ask questions and all were answered. The patient agreed with the plan and demonstrated an understanding of the instructions.   The patient was advised to call back or seek an in-person evaluation if the symptoms worsen or if the condition fails to improve as anticipated.  I provided 30 minutes of non-face-to-face time during this encounter.  The patient was located at home.  The provider was located at home.   Corie Chiquito, PMHNP   Subjective:   Patient ID:  Alec Taylor is a 73 y.o. (DOB [birth year]) male.  Chief Complaint:  Chief Complaint  Patient presents with  . Anxiety  . Other    Irritability  . Insomnia  . Depression    HPI Alec Taylor presents for follow-up of depression, anxiety, and insomnia. 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 "I am so damn angry. I am angry about everything." He reports increased irritability. He reports "I screamed at a gas pump yesterday." He reports that her was verbally agitated at a health care office after having to complete duplicate paper work.  He reports that he stopped Buspar, Cymbalta, and Wellbutrin XL. He reports that he stopped medications after amputation when   He reports that he is not sleeping as well on lower dose of Ambien CR and is only  sleeping 3 hours and having difficulty returning to sleep. He reports that his energy is higher than it was prior to stopping medications. He reports that his motivation is ok. He is hopeful that he can play golf again in June. Reports feelings of worthlessness. Reports that he has had some passive death wishes. Denies SI.   He reports that his thinking seems to be clearer since stopping medications and  Had big toe on left foot amputated and reports that he has accepted this transition.   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 Daughter has been doing well and is healthy. Dau has been teaching and contemplating going to graduate school.   Past Psychiatric Medication Trials: Cymbalta Aricept Wellbutrin BuSpar Ambien Ambien CR Xanax Klonopin Lamictal  Review of Systems:  Review of Systems  Musculoskeletal: Negative for gait problem.       Recent toe amputation. Current sciatica and that this has happened since taking a long car ride about a year ago. Reports that it was relieved with massage and seeing a chiropractor and has not been able to see them recently with the pandemic.   Allergic/Immunologic: Positive for environmental allergies.  Neurological: Negative for tremors.  Psychiatric/Behavioral:       Please refer to HPI    Medications: I have reviewed the patient's current medications.  Current Outpatient Medications  Medication Sig Dispense Refill  . ALPRAZolam (XANAX) 0.25 MG tablet TAKE 1/2 TO 1 TABLET BY MOUTH ONE TIME DAILY AS NEEDED FOR ANXIETY 30 tablet 1  .  aspirin EC 81 MG tablet Take 81 mg by mouth daily.    . carvedilol (COREG) 6.25 MG tablet Take 1 tablet (6.25 mg total) by mouth 2 (two) times daily with a meal. 60 tablet 6  . Cholecalciferol (VITAMIN D3) 5000 units CAPS Take 5,000 Units by mouth 2 (two) times daily.    . empagliflozin (JARDIANCE) 10 MG TABS tablet Take 10 mg by mouth daily. 30 tablet 6  . ENTRESTO 49-51 MG TAKE 1 TABLET BY MOUTH TWICE A  DAY 60 tablet 11  . folic acid (FOLVITE) 1 MG tablet Take 1 mg by mouth daily.  2  . glimepiride (AMARYL) 2 MG tablet Take 2 mg by mouth every evening.  1  . metFORMIN (GLUCOPHAGE-XR) 500 MG 24 hr tablet Take 500 mg by mouth 2 (two) times daily.  1  . Multiple Vitamins-Minerals (MULTIVITAMIN ADULT PO) Take 1 tablet by mouth every morning.    . pantoprazole (PROTONIX) 40 MG tablet Take 40 mg by mouth 2 (two) times daily.  0  . saccharomyces boulardii ([employer] DIGESTIVE PROBIOTIC) 250 MG capsule Take 250 mg by mouth every morning.    [name]. ammonium lactate (LAC-HYDRIN) 12 % lotion Apply topically 2 (two) times daily as needed. To feet  5  . atorvastatin (LIPITOR) 20 MG tablet Take 20 mg by mouth daily.  3  . buPROPion (WELLBUTRIN XL) 150 MG 24 hr tablet Take 1 tablet (150 mg total) by mouth every morning. 90 tablet 1  . busPIRone (BUSPAR) 15 MG tablet Take 1/3 tablet p.o. twice daily for 1 week, then take 2/3 tablet p.o. twice daily for 1 week, then take 1 tablet p.o. twice daily 180 tablet 0  . donepezil (ARICEPT) 5 MG tablet Take 1 tablet (5 mg total) by mouth daily. 90 tablet 1  . DULoxetine (CYMBALTA) 60 MG capsule Take 1 capsule (60 mg total) by mouth every morning. 90 capsule 1  . finasteride (PROSCAR) 5 MG tablet Take 5 mg by mouth every evening.   6  . methotrexate (RHEUMATREX) 2.5 MG tablet Take 12.5 mg by mouth once a week. On Wednesday  3  . ofloxacin (OCUFLOX) 0.3 % ophthalmic solution Place 1 drop into both eyes 4 (four) times daily. Prior to procedures.    [name]. spironolactone (ALDACTONE) 25 MG tablet TAKE 1/2 TABLET BY MOUTH EVERY DAY (Patient not taking: Reported on 04/20/2019) 45 tablet 3  . zolpidem (AMBIEN CR) 12.5 MG CR tablet Take 1 tablet (12.5 mg total) by mouth at bedtime as needed for sleep. 90 tablet 0   No current facility-administered medications for this visit.     Medication Side Effects: None  Allergies:  Allergies  Allergen Reactions  . Celecoxib     hyperkalemia  .  Losartan     hyperkalemia  . Ramipril     Cough     Past Medical History:  Diagnosis Date  . CAD (coronary artery disease) 09/2007   s/p CABG approximately 2004-[facility] (Cath - 17 Sep 2007:90% prox LAD with diffuse 85% narrowing to distal segment, 80% prox CXA, RCA: prox 90%, prox-mid 70%, mid 85%, distal 80%, D1 80%; Normal EF 65% --status post CABG--LIMA to LAD, SVG to PDA.    [name]. COPD (chronic obstructive pulmonary disease) (HCC)   . Depression   . Diabetes mellitus without complication (HCC)   . Hypertension   . Ischemic cardiomyopathy   . Pleural effusion   . S/P CABG x 2    CABG--LIMA to LAD, SVG to PDA.  Family History  Problem Relation Age of Onset  . Hypertension Father     Social History   Socioeconomic History  . Marital status: Legally Separated    Spouse name: Not on file  . Number of children: Not on file  . Years of education: Not on file  . Highest education level: Not on file  Occupational History  . Not on file  Social Needs  . Financial resource strain: Not on file  . Food insecurity:    Worry: Not on file    Inability: Not on file  . Transportation needs:    Medical: Not on file    Non-medical: Not on file  Tobacco Use  . Smoking status: Never Smoker  . Smokeless tobacco: Never Used  Substance and Sexual Activity  . Alcohol use: Yes    Comment: occ  . Drug use: Never  . Sexual activity: Not on file  Lifestyle  . Physical activity:    Days per week: Not on file    Minutes per session: Not on file  . Stress: Not on file  Relationships  . Social connections:    Talks on phone: Not on file    Gets together: Not on file    Attends religious service: Not on file    Active member of club or organization: Not on file    Attends meetings of clubs or organizations: Not on file    Relationship status: Not on file  . Intimate partner violence:    Fear of current or ex partner: Not on file    Emotionally abused: Not on file    Physically abused:  Not on file    Forced sexual activity: Not on file  Other Topics Concern  . Not on file  Social History Narrative  . Not on file    Past Medical History, Surgical history, Social history, and Family history were reviewed and updated as appropriate.   Please see review of systems for further details on the patient's review from today.   Objective:   Physical Exam:  There were no vitals taken for this visit.  Physical Exam Neurological:     Mental Status: He is alert and oriented to person, place, and time.     Cranial Nerves: No dysarthria.  Psychiatric:        Attention and Perception: Attention normal.        Mood and Affect: Mood is anxious and depressed.        Speech: Speech normal.        Behavior: Behavior is cooperative.        Thought Content: Thought content normal. Thought content is not paranoid or delusional. Thought content does not include homicidal or suicidal ideation. Thought content does not include homicidal or suicidal plan.        Cognition and Memory: Cognition and memory normal.        Judgment: Judgment normal.     Lab Review:     Component Value Date/Time   NA 133 (L) 12/15/2018 1021   NA 140 07/08/2018 0926   K 4.5 12/15/2018 1021   CL 101 12/15/2018 1021   CO2 24 12/15/2018 1021   GLUCOSE 189 (H) 12/15/2018 1021   BUN 32 (H) 12/15/2018 1021   BUN 27 07/08/2018 0926   CREATININE 1.01 12/15/2018 1021   CALCIUM 9.2 12/15/2018 1021   PROT 7.1 04/29/2018 0119   ALBUMIN 3.6 04/29/2018 0119   AST 34 04/29/2018 0119   ALT 36  04/29/2018 0119   ALKPHOS 90 04/29/2018 0119   BILITOT 0.9 04/29/2018 0119   GFRNONAA >60 12/15/2018 1021   GFRAA >60 12/15/2018 1021       Component Value Date/Time   WBC 7.8 12/15/2018 1021   RBC 4.28 12/15/2018 1021   HGB 12.2 (L) 12/15/2018 1021   HCT 38.6 (L) 12/15/2018 1021   PLT 214 12/15/2018 1021   MCV 90.2 12/15/2018 1021   MCH 28.5 12/15/2018 1021   MCHC 31.6 12/15/2018 1021   RDW 12.4 12/15/2018 1021    LYMPHSABS 1.2 09/02/2018 1020   MONOABS 0.5 09/02/2018 1020   EOSABS 0.2 09/02/2018 1020   BASOSABS 0.0 09/02/2018 1020    No results found for: POCLITH, LITHIUM   No results found for: PHENYTOIN, PHENOBARB, VALPROATE, CBMZ   .res Assessment: Plan:   Discussed treatment plan with patient and his desire to minimize number of medications if possible.  Recommended restarting BuSpar since patient agrees that this was helpful for his anxiety and irritability in the past.  Discussed restarting BuSpar and titrating dose to minimize risk of side effects. Start BuSpar 15 mg 1/3 tablet twice daily for 1 week, then increase to 2/3 tablet twice daily for 1 week, then increase to 1 tablet twice daily for anxiety. Also discussed option of resuming Ambien CR 12.5 mg p.o. nightly for insomnia since he has had significantly worsening insomnia with decrease in dose, did not have any tolerability issues in the past on 12.5 mg, and has not noticed any improvement in concentration or cognition with lower dose.  Provider and patient agree that benefits of higher dose likely outweigh risk at this time. Patient to follow-up with this provider in 1 month or sooner if clinically indicated. Patient advised to contact office with any questions, adverse effects, or acute worsening in signs and symptoms.  Patient contracts fully for safety and agrees to contact office if he experiences suicidal intent or plan.  Primary insomnia - Plan: zolpidem (AMBIEN CR) 12.5 MG CR tablet  Generalized anxiety disorder - Plan: busPIRone (BUSPAR) 15 MG tablet  Recurrent major depressive disorder, in partial remission (HCC)  Please see After Visit Summary for patient specific instructions.  Future Appointments  Date Time Provider Department Center  04/27/2019 10:30 AM Sharlene Dory, DO [facility]  06/01/2019  9:00 AM Corie Chiquito, PMHNP [facility] None    No orders of the defined types were placed in this encounter.      -------------------------------

## 2019-04-23 ENCOUNTER — Telehealth: Payer: Self-pay

## 2019-04-23 NOTE — Telephone Encounter (Signed)
Copied from CRM [id number]. Topic: Appointment Scheduling - Scheduling Inquiry for Clinic >> Apr 23, 2019 10:14 AM Elliot Gault wrote: Patient inquiring about 04/27/2019 physical appointment with Dr. Carmelia Roller and would like a follow up call regarding what to expect, please advise. (attempted to call scheduling line a few times)

## 2019-04-24 NOTE — Telephone Encounter (Signed)
Scheduling rescheduled appt yesterday to 07/14/2019

## 2019-04-27 ENCOUNTER — Encounter: Payer: BLUE CROSS/BLUE SHIELD | Admitting: Family Medicine

## 2019-04-29 DIAGNOSIS — Z89412 Acquired absence of left great toe: Secondary | ICD-10-CM | POA: Diagnosis not present

## 2019-04-29 NOTE — Telephone Encounter (Signed)
Called insurance for tier exception.  Per rep, pt does not have a plan that allows for tier exception.  As of right now, patient will benefit from doing 90 day supply as well as utilize coupons for [place].  Referred to social work [place] to assist.  She will communicate same information to patient.

## 2019-04-30 ENCOUNTER — Telehealth (HOSPITAL_COMMUNITY): Payer: Self-pay | Admitting: Licensed Clinical Social Worker

## 2019-04-30 NOTE — Telephone Encounter (Signed)
CSW consulted to address pt medication costs concerns.  Per patient he has been paying $1000/month for Entresto and $500/month for Jardiance.  RN has already attempted tier exception but it is not an option with this patients insurance.  Pt with commercial insurance provider so CSW discussed using copay cards with patient- CSW able to activate card for jardiance and entresto and provide pt pharmacy with the numbers.  CSW will continue to follow and assist as needed  Burna Sis, LCSW Clinical Social Worker [facility] Desk#: [phone] Cell#: [phone]

## 2019-05-12 DIAGNOSIS — E113513 Type 2 diabetes mellitus with proliferative diabetic retinopathy with macular edema, bilateral: Secondary | ICD-10-CM | POA: Diagnosis not present

## 2019-05-14 ENCOUNTER — Telehealth: Payer: Self-pay | Admitting: Psychiatry

## 2019-05-14 NOTE — Telephone Encounter (Signed)
Pt has been taking Buspar. It is making him dizzy, does not want to take it.

## 2019-05-14 NOTE — Telephone Encounter (Signed)
Left voicemail with detailed information 

## 2019-06-01 ENCOUNTER — Ambulatory Visit (INDEPENDENT_AMBULATORY_CARE_PROVIDER_SITE_OTHER): Payer: BC Managed Care – PPO | Admitting: Psychiatry

## 2019-06-01 ENCOUNTER — Encounter: Payer: Self-pay | Admitting: Psychiatry

## 2019-06-01 ENCOUNTER — Other Ambulatory Visit: Payer: Self-pay

## 2019-06-01 DIAGNOSIS — F3341 Major depressive disorder, recurrent, in partial remission: Secondary | ICD-10-CM

## 2019-06-01 DIAGNOSIS — F411 Generalized anxiety disorder: Secondary | ICD-10-CM

## 2019-06-01 DIAGNOSIS — F5101 Primary insomnia: Secondary | ICD-10-CM

## 2019-06-01 MED ORDER — DULOXETINE HCL 30 MG PO CPEP: ORAL_CAPSULE | ORAL | 0 refills | Status: DC

## 2019-06-01 MED ORDER — DULOXETINE HCL 60 MG PO CPEP: 60.0000 mg | ORAL_CAPSULE | Freq: Every day | ORAL | 1 refills | Status: DC

## 2019-06-01 MED ORDER — ALPRAZOLAM 0.25 MG PO TABS: ORAL_TABLET | ORAL | 1 refills | Status: DC

## 2019-06-01 NOTE — Progress Notes (Signed)
Alec Taylor [id number] 07/24/1946 73 y.o.  Subjective:   Patient ID:  Alec Taylor is a 73 y.o. (DOB 02/19/1946) male.  Chief Complaint:  Chief Complaint  Patient presents with  . Anxiety  . Depression  . Other    HPI Alec BleacherHerschel Vinzant presents to the office today for follow-up of anxiety, depression, and insomnia. He report that "my wife says I am angry at the world, and I probably am." He reports that that he is frustrated about physical issues, loss of independence, and pandemic. He reports that he has been more irritable. He reports that he has been "very depressed." Sleep has improved. He reports that appetite has been "too good." He reports low energy and motivation. He reports poor focus and concentration. He reports that he loses his train of thought after about a minute. Describes going to look something up and then forgetting what he was going to look up.   He reports passive death wishes- "I think it wouldn't be bad for me to catch COVID and die." Denies SI.   He reports that he has less work responsibilities and trusts the person that is managing things for him.   May 22, 2019 was anniversary of daughter's brain surgery.   Past Psychiatric Medication Trials: Cymbalta Aricept Wellbutrin BuSpar Ambien Ambien CR Xanax Klonopin  Review of Systems:  Review of Systems  Musculoskeletal: Positive for gait problem.  Neurological: Negative for tremors.  Psychiatric/Behavioral:       Please refer to HPI    Medications: I have reviewed the patient's current medications.  Current Outpatient Medications  Medication Sig Dispense Refill  . ALPRAZolam (XANAX) 0.25 MG tablet TAKE 1/2 TO 1 TABLET BY MOUTH ONE TIME DAILY AS NEEDED FOR ANXIETY 30 tablet 1  . aspirin EC 81 MG tablet Take 81 mg by mouth daily.    [name]. atorvastatin (LIPITOR) 20 MG tablet Take 20 mg by mouth daily.  3  . busPIRone (BUSPAR) 15 MG tablet Take 1/3 tablet p.o. twice daily for 1 week, then take 2/3 tablet  p.o. twice daily for 1 week, then take 1 tablet p.o. twice daily (Patient taking differently: Take 7.5 mg by mouth 2 (two) times daily. Take 1/3 tablet p.o. twice daily for 1 week, then take 2/3 tablet p.o. twice daily for 1 week, then take 1 tablet p.o. twice daily) 180 tablet 0  . carvedilol (COREG) 6.25 MG tablet Take 1 tablet (6.25 mg total) by mouth 2 (two) times daily with a meal. 60 tablet 6  . Cholecalciferol (VITAMIN D3) 5000 units CAPS Take 5,000 Units by mouth 2 (two) times daily.    . empagliflozin (JARDIANCE) 10 MG TABS tablet Take 10 mg by mouth daily. 30 tablet 6  . ENTRESTO 49-51 MG TAKE 1 TABLET BY MOUTH TWICE A DAY 60 tablet 11  . finasteride (PROSCAR) 5 MG tablet Take 5 mg by mouth every evening.   6  . folic acid (FOLVITE) 1 MG tablet Take 1 mg by mouth daily.  2  . glimepiride (AMARYL) 2 MG tablet Take 2 mg by mouth every evening.  1  . metFORMIN (GLUCOPHAGE-XR) 500 MG 24 hr tablet Take 500 mg by mouth 2 (two) times daily.  1  . methotrexate (RHEUMATREX) 2.5 MG tablet Take 12.5 mg by mouth once a week. On Wednesday  3  . Multiple Vitamins-Minerals (MULTIVITAMIN ADULT PO) Take 1 tablet by mouth every morning.    . pantoprazole (PROTONIX) 40 MG tablet Take 40 mg by mouth 2 (  two) times daily.  0  . spironolactone (ALDACTONE) 25 MG tablet TAKE 1/2 TABLET BY MOUTH EVERY DAY 45 tablet 3  . zolpidem (AMBIEN CR) 12.5 MG CR tablet Take 1 tablet (12.5 mg total) by mouth at bedtime as needed for sleep. 90 tablet 0  . ammonium lactate (LAC-HYDRIN) 12 % lotion Apply topically 2 (two) times daily as needed. To feet  5  . DULoxetine (CYMBALTA) 30 MG capsule Take 1 capsule po q am x 1 week, then 2 capsules po q am 30 capsule 0  . DULoxetine (CYMBALTA) 60 MG capsule Take 1 capsule (60 mg total) by mouth daily. 30 capsule 1  . ofloxacin (OCUFLOX) 0.3 % ophthalmic solution Place 1 drop into both eyes 4 (four) times daily. Prior to procedures.    . saccharomyces boulardii ([employer] DIGESTIVE PROBIOTIC)  250 MG capsule Take 250 mg by mouth every morning.     No current facility-administered medications for this visit.     Medication Side Effects: Other: Reports possible dizziness/palpitations if he takes more than 7.5 mg BID.   Allergies:  Allergies  Allergen Reactions  . Celecoxib     hyperkalemia  . Losartan     hyperkalemia  . Ramipril     Cough     Past Medical History:  Diagnosis Date  . CAD (coronary artery disease) 09/2007   s/p CABG approximately 2004-[facility] (Cath - 17 Sep 2007:90% prox LAD with diffuse 85% narrowing to distal segment, 80% prox CXA, RCA: prox 90%, prox-mid 70%, mid 85%, distal 80%, D1 80%; Normal EF 65% --status post CABG--LIMA to LAD, SVG to PDA.    [name] COPD (chronic obstructive pulmonary disease) ([place])   . Depression   . Diabetes mellitus without complication ([place])   . Hypertension   . Ischemic cardiomyopathy   . Pleural effusion   . S/P CABG x 2    CABG--LIMA to LAD, SVG to PDA.      Family History  Problem Relation Age of Onset  . Hypertension Father     Social History   Socioeconomic History  . Marital status: Legally Separated    Spouse name: Not on file  . Number of children: Not on file  . Years of education: Not on file  . Highest education level: Not on file  Occupational History  . Not on file  Social Needs  . Financial resource strain: Not on file  . Food insecurity    Worry: Not on file    Inability: Not on file  . Transportation needs    Medical: Not on file    Non-medical: Not on file  Tobacco Use  . Smoking status: Never Smoker  . Smokeless tobacco: Never Used  Substance and Sexual Activity  . Alcohol use: Yes    Comment: occ  . Drug use: Never  . Sexual activity: Not on file  Lifestyle  . Physical activity    Days per week: Not on file    Minutes per session: Not on file  . Stress: Not on file  Relationships  . Social Herbalist on phone: Not on file    Gets together: Not on file    Attends  religious service: Not on file    Active member of club or organization: Not on file    Attends meetings of clubs or organizations: Not on file    Relationship status: Not on file  . Intimate partner violence    Fear of current or ex partner:  Not on file    Emotionally abused: Not on file    Physically abused: Not on file    Forced sexual activity: Not on file  Other Topics Concern  . Not on file  Social History Narrative  . Not on file    Past Medical History, Surgical history, Social history, and Family history were reviewed and updated as appropriate.   Please see review of systems for further details on the patient's review from today.   Objective:   Physical Exam:  There were no vitals taken for this visit.  Physical Exam Constitutional:      General: He is not in acute distress.    Appearance: He is well-developed.  Musculoskeletal:        General: No deformity.  Neurological:     Mental Status: He is alert and oriented to person, place, and time.     Coordination: Coordination normal.  Psychiatric:        Attention and Perception: Attention and perception normal. He does not perceive auditory or visual hallucinations.        Mood and Affect: Mood is anxious and depressed. Affect is not labile, blunt, angry or inappropriate.        Speech: Speech normal.        Behavior: Behavior normal.        Thought Content: Thought content normal. Thought content does not include homicidal or suicidal ideation. Thought content does not include homicidal or suicidal plan.        Cognition and Memory: Cognition and memory normal.        Judgment: Judgment normal.     Comments: Insight intact. No delusions.  Irritable mood     Lab Review:     Component Value Date/Time   NA 133 (L) 12/15/2018 1021   NA 140 07/08/2018 0926   K 4.5 12/15/2018 1021   CL 101 12/15/2018 1021   CO2 24 12/15/2018 1021   GLUCOSE 189 (H) 12/15/2018 1021   BUN 32 (H) 12/15/2018 1021   BUN 27  07/08/2018 0926   CREATININE 1.01 12/15/2018 1021   CALCIUM 9.2 12/15/2018 1021   PROT 7.1 04/29/2018 0119   ALBUMIN 3.6 04/29/2018 0119   AST 34 04/29/2018 0119   ALT 36 04/29/2018 0119   ALKPHOS 90 04/29/2018 0119   BILITOT 0.9 04/29/2018 0119   GFRNONAA >60 12/15/2018 1021   GFRAA >60 12/15/2018 1021       Component Value Date/Time   WBC 7.8 12/15/2018 1021   RBC 4.28 12/15/2018 1021   HGB 12.2 (L) 12/15/2018 1021   HCT 38.6 (L) 12/15/2018 1021   PLT 214 12/15/2018 1021   MCV 90.2 12/15/2018 1021   MCH 28.5 12/15/2018 1021   MCHC 31.6 12/15/2018 1021   RDW 12.4 12/15/2018 1021   LYMPHSABS 1.2 09/02/2018 1020   MONOABS 0.5 09/02/2018 1020   EOSABS 0.2 09/02/2018 1020   BASOSABS 0.0 09/02/2018 1020    No results found for: POCLITH, LITHIUM   No results found for: PHENYTOIN, PHENOBARB, VALPROATE, CBMZ   .res Assessment: Plan:   Discussed potential benefits, risks, and side effects of re-starting Cymbalta since pt has experienced worsening depression, irritability, and anxiety.  Will continue Buspar until anxiety improves with Cymbalta. Will consider d/c of Buspar in the future if appropriate.  Continue Xanax prn anxiety.  Continue Ambien CR for insomnia.   Pt to f/u in 4-6 weeks or sooner if clinically indicated.  Patient advised to contact office with any  questions, adverse effects, or acute worsening in signs and symptoms.  Alec Taylor was seen today for anxiety, depression and other.  Diagnoses and all orders for this visit:  Primary insomnia  Generalized anxiety disorder -     DULoxetine (CYMBALTA) 30 MG capsule; Take 1 capsule po q am x 1 week, then 2 capsules po q am -     DULoxetine (CYMBALTA) 60 MG capsule; Take 1 capsule (60 mg total) by mouth daily. -     ALPRAZolam (XANAX) 0.25 MG tablet; TAKE 1/2 TO 1 TABLET BY MOUTH ONE TIME DAILY AS NEEDED FOR ANXIETY  Recurrent major depressive disorder, in partial remission (HCC) -     DULoxetine (CYMBALTA) 30 MG  capsule; Take 1 capsule po q am x 1 week, then 2 capsules po q am -     DULoxetine (CYMBALTA) 60 MG capsule; Take 1 capsule (60 mg total) by mouth daily.     Please see After Visit Summary for patient specific instructions.  Future Appointments  Date Time Provider Department Center  07/13/2019  9:00 AM Corie Chiquitoarter, Tajana Crotteau, PMHNP [facility] None  07/14/2019  8:00 AM Carmelia RollerWendling, Jilda RocheNicholas Paul, DO [facility]    No orders of the defined types were placed in this encounter.   -------------------------------

## 2019-06-02 ENCOUNTER — Other Ambulatory Visit: Payer: Self-pay | Admitting: Psychiatry

## 2019-06-02 DIAGNOSIS — L97522 Non-pressure chronic ulcer of other part of left foot with fat layer exposed: Secondary | ICD-10-CM | POA: Diagnosis not present

## 2019-06-02 DIAGNOSIS — F411 Generalized anxiety disorder: Secondary | ICD-10-CM

## 2019-06-02 DIAGNOSIS — M2042 Other hammer toe(s) (acquired), left foot: Secondary | ICD-10-CM | POA: Diagnosis not present

## 2019-06-03 NOTE — Telephone Encounter (Signed)
Next appt 08/30

## 2019-06-11 DIAGNOSIS — L97522 Non-pressure chronic ulcer of other part of left foot with fat layer exposed: Secondary | ICD-10-CM | POA: Diagnosis not present

## 2019-06-11 DIAGNOSIS — L03032 Cellulitis of left toe: Secondary | ICD-10-CM | POA: Diagnosis not present

## 2019-06-11 DIAGNOSIS — M2042 Other hammer toe(s) (acquired), left foot: Secondary | ICD-10-CM | POA: Diagnosis not present

## 2019-06-11 DIAGNOSIS — E11621 Type 2 diabetes mellitus with foot ulcer: Secondary | ICD-10-CM | POA: Diagnosis not present

## 2019-06-15 DIAGNOSIS — L97529 Non-pressure chronic ulcer of other part of left foot with unspecified severity: Secondary | ICD-10-CM | POA: Diagnosis not present

## 2019-06-18 DIAGNOSIS — L97522 Non-pressure chronic ulcer of other part of left foot with fat layer exposed: Secondary | ICD-10-CM | POA: Diagnosis not present

## 2019-06-18 DIAGNOSIS — M2042 Other hammer toe(s) (acquired), left foot: Secondary | ICD-10-CM | POA: Diagnosis not present

## 2019-06-22 DIAGNOSIS — Z01812 Encounter for preprocedural laboratory examination: Secondary | ICD-10-CM | POA: Diagnosis not present

## 2019-06-22 DIAGNOSIS — L97522 Non-pressure chronic ulcer of other part of left foot with fat layer exposed: Secondary | ICD-10-CM | POA: Diagnosis not present

## 2019-06-22 DIAGNOSIS — Z1159 Encounter for screening for other viral diseases: Secondary | ICD-10-CM | POA: Diagnosis not present

## 2019-06-26 DIAGNOSIS — L97522 Non-pressure chronic ulcer of other part of left foot with fat layer exposed: Secondary | ICD-10-CM | POA: Diagnosis not present

## 2019-06-26 DIAGNOSIS — Z7984 Long term (current) use of oral hypoglycemic drugs: Secondary | ICD-10-CM | POA: Diagnosis not present

## 2019-06-26 DIAGNOSIS — I96 Gangrene, not elsewhere classified: Secondary | ICD-10-CM | POA: Diagnosis not present

## 2019-06-26 DIAGNOSIS — E1140 Type 2 diabetes mellitus with diabetic neuropathy, unspecified: Secondary | ICD-10-CM | POA: Diagnosis not present

## 2019-06-26 DIAGNOSIS — E1121 Type 2 diabetes mellitus with diabetic nephropathy: Secondary | ICD-10-CM | POA: Diagnosis not present

## 2019-06-26 DIAGNOSIS — E11621 Type 2 diabetes mellitus with foot ulcer: Secondary | ICD-10-CM | POA: Diagnosis not present

## 2019-06-26 DIAGNOSIS — L97514 Non-pressure chronic ulcer of other part of right foot with necrosis of bone: Secondary | ICD-10-CM | POA: Diagnosis not present

## 2019-06-26 DIAGNOSIS — M2042 Other hammer toe(s) (acquired), left foot: Secondary | ICD-10-CM | POA: Diagnosis not present

## 2019-06-26 DIAGNOSIS — M86172 Other acute osteomyelitis, left ankle and foot: Secondary | ICD-10-CM | POA: Diagnosis not present

## 2019-06-30 ENCOUNTER — Telehealth: Payer: Self-pay | Admitting: Psychiatry

## 2019-06-30 ENCOUNTER — Other Ambulatory Visit (HOSPITAL_COMMUNITY): Payer: Self-pay | Admitting: Internal Medicine

## 2019-06-30 ENCOUNTER — Other Ambulatory Visit: Payer: Self-pay | Admitting: Psychiatry

## 2019-06-30 DIAGNOSIS — F411 Generalized anxiety disorder: Secondary | ICD-10-CM

## 2019-06-30 DIAGNOSIS — F3341 Major depressive disorder, recurrent, in partial remission: Secondary | ICD-10-CM

## 2019-06-30 NOTE — Telephone Encounter (Signed)
Patient left vm @11 :34 today need refill on Duloxatine 30 mg, to be [facility] to [facility] on [address]

## 2019-06-30 NOTE — Telephone Encounter (Signed)
Informed pt he should be taking one of the 60 mg duloxetine now, was on duloxetine 30 mg 2 daily. Verified understanding on dose change.

## 2019-07-13 ENCOUNTER — Ambulatory Visit: Payer: Medicare Other | Admitting: Psychiatry

## 2019-07-14 ENCOUNTER — Ambulatory Visit (INDEPENDENT_AMBULATORY_CARE_PROVIDER_SITE_OTHER): Payer: BLUE CROSS/BLUE SHIELD | Admitting: Family Medicine

## 2019-07-14 ENCOUNTER — Other Ambulatory Visit: Payer: Self-pay

## 2019-07-14 ENCOUNTER — Encounter: Payer: Self-pay | Admitting: Family Medicine

## 2019-07-14 VITALS — BP 122/72 | HR 99 | Temp 97.9°F | Ht 70.0 in | Wt 184.1 lb

## 2019-07-14 DIAGNOSIS — E119 Type 2 diabetes mellitus without complications: Secondary | ICD-10-CM | POA: Diagnosis not present

## 2019-07-14 DIAGNOSIS — Z1159 Encounter for screening for other viral diseases: Secondary | ICD-10-CM

## 2019-07-14 DIAGNOSIS — Z0000 Encounter for general adult medical examination without abnormal findings: Secondary | ICD-10-CM | POA: Diagnosis not present

## 2019-07-14 DIAGNOSIS — Z23 Encounter for immunization: Secondary | ICD-10-CM

## 2019-07-14 LAB — COMPREHENSIVE METABOLIC PANEL
ALT: 43 U/L (ref 0–53)
AST: 32 U/L (ref 0–37)
Albumin: 4.2 g/dL (ref 3.5–5.2)
Alkaline Phosphatase: 65 U/L (ref 39–117)
BUN: 41 mg/dL — ABNORMAL HIGH (ref 6–23)
CO2: 29 mEq/L (ref 19–32)
Calcium: 9.2 mg/dL (ref 8.4–10.5)
Chloride: 103 mEq/L (ref 96–112)
Creatinine, Ser: 1.07 mg/dL (ref 0.40–1.50)
GFR: 68.08 mL/min (ref >60.00)
Glucose, Bld: 136 mg/dL — ABNORMAL HIGH (ref 70–99)
Potassium: 5 mEq/L (ref 3.5–5.1)
Sodium: 139 mEq/L (ref 135–145)
Total Bilirubin: 0.3 mg/dL (ref 0.2–1.2)
Total Protein: 6.7 g/dL (ref 6.0–8.3)

## 2019-07-14 LAB — LIPID PANEL
Cholesterol: 117 mg/dL (ref 0–200)
HDL: 39.2 mg/dL (ref >39.00)
LDL Cholesterol: 50 mg/dL (ref 0–99)
NonHDL: 77.84
Total CHOL/HDL Ratio: 3
Triglycerides: 137 mg/dL (ref 0.0–149.0)
VLDL: 27.4 mg/dL (ref 0.0–40.0)

## 2019-07-14 LAB — CBC
HCT: 37 % — ABNORMAL LOW (ref 39.0–52.0)
Hemoglobin: 11.7 g/dL — ABNORMAL LOW (ref 13.0–17.0)
MCHC: 31.8 g/dL (ref 30.0–36.0)
MCV: 82.5 fl (ref 78.0–100.0)
Platelets: 204 10*3/uL (ref 150.0–400.0)
RBC: 4.48 Mil/uL (ref 4.22–5.81)
RDW: 17.6 % — ABNORMAL HIGH (ref 11.5–15.5)
WBC: 7 10*3/uL (ref 4.0–10.5)

## 2019-07-14 LAB — HEMOGLOBIN A1C: Hgb A1c MFr Bld: 6.9 % — ABNORMAL HIGH (ref 4.6–6.5)

## 2019-07-14 LAB — MICROALBUMIN / CREATININE URINE RATIO
Creatinine,U: 58.5 mg/dL
Microalb Creat Ratio: 3.3 mg/g (ref 0.0–30.0)
Microalb, Ur: 1.9 mg/dL (ref 0.0–1.9)

## 2019-07-14 MED ORDER — ATORVASTATIN CALCIUM 20 MG PO TABS: 20.0000 mg | ORAL_TABLET | Freq: Every day | ORAL | 3 refills | Status: DC

## 2019-07-14 NOTE — Progress Notes (Signed)
Chief Complaint  Patient presents with  . Annual Exam    Well Male Clearence Alec Taylor is here for a complete physical.   His last physical was >1 year ago.  Current diet: in general, a "pretty good" diet.   Current exercise: nothing Weight trend: stable Daytime fatigue? No. Seat belt? Yes.    Health maintenance Colonoscopy- Due; prolonged 2/2 pandemic Tetanus- No Hep C- No Pneumonia vaccine- Yes  Past Medical History:  Diagnosis Date  . CAD (coronary artery disease) 09/2007   s/p CABG approximately 2004-[facility] (Cath - 17 Sep 2007:90% prox LAD with diffuse 85% narrowing to distal segment, 80% prox CXA, RCA: prox 90%, prox-mid 70%, mid 85%, distal 80%, D1 80%; Normal EF 65% --status post CABG--LIMA to LAD, SVG to PDA.    [name] COPD (chronic obstructive pulmonary disease) (HCC)   . Depression   . Diabetes mellitus without complication (HCC)   . Hypertension   . Ischemic cardiomyopathy   . Pleural effusion   . S/P CABG x 2    CABG--LIMA to LAD, SVG to PDA.       Past Surgical History:  Procedure Laterality Date  . APPENDECTOMY    . CHOLECYSTECTOMY    . CORONARY ARTERY BYPASS GRAFT    . GASTRIC BYPASS    . LEFT HEART CATH AND CORS/GRAFTS ANGIOGRAPHY N/A 04/30/2018   Procedure: LEFT HEART CATH AND CORS/GRAFTS ANGIOGRAPHY;  Surgeon: Marykay Lex, MD;  Location: [facility] INVASIVE CV LAB;  Service: Cardiovascular;  Laterality: N/A;  . TOE AMPUTATION      Medications  Current Outpatient Medications on File Prior to Visit  Medication Sig Dispense Refill  . ALPRAZolam (XANAX) 0.25 MG tablet TAKE ONE-HALF TO ONE TABLET BY MOUTH ONE TIME DAILY AS NEEDED FOR ANXIETY 30 tablet 1  . ammonium lactate (LAC-HYDRIN) 12 % lotion Apply topically 2 (two) times daily as needed. To feet  5  . aspirin EC 81 MG tablet Take 81 mg by mouth daily.    [name] atorvastatin (LIPITOR) 20 MG tablet Take 20 mg by mouth daily.  3  . busPIRone (BUSPAR) 15 MG tablet Take 1/3 tablet p.o. twice daily for 1 week, then take 2/3  tablet p.o. twice daily for 1 week, then take 1 tablet p.o. twice daily (Patient taking differently: Take 7.5 mg by mouth 2 (two) times daily. Take 1/3 tablet p.o. twice daily for 1 week, then take 2/3 tablet p.o. twice daily for 1 week, then take 1 tablet p.o. twice daily) 180 tablet 0  . carvedilol (COREG) 6.25 MG tablet Take 1 tablet (6.25 mg total) by mouth 2 (two) times daily with a meal. 60 tablet 6  . Cholecalciferol (VITAMIN D3) 5000 units CAPS Take 5,000 Units by mouth 2 (two) times daily.    . DULoxetine (CYMBALTA) 60 MG capsule Take 1 capsule (60 mg total) by mouth daily. 30 capsule 1  . ENTRESTO 49-51 MG TAKE 1 TABLET BY MOUTH TWICE A DAY 60 tablet 11  . finasteride (PROSCAR) 5 MG tablet Take 5 mg by mouth every evening.   6  . folic acid (FOLVITE) 1 MG tablet Take 1 mg by mouth daily.  2  . glimepiride (AMARYL) 2 MG tablet Take 2 mg by mouth every evening.  1  . JARDIANCE 10 MG TABS tablet TAKE 1 TABLET BY MOUTH EVERY DAY 30 tablet 6  . metFORMIN (GLUCOPHAGE-XR) 500 MG 24 hr tablet Take 500 mg by mouth 2 (two) times daily.  1  . methotrexate (RHEUMATREX)  2.5 MG tablet Take 12.5 mg by mouth once a week. On Wednesday  3  . Multiple Vitamins-Minerals (MULTIVITAMIN ADULT PO) Take 1 tablet by mouth every morning.    [name] ofloxacin (OCUFLOX) 0.3 % ophthalmic solution Place 1 drop into both eyes 4 (four) times daily. Prior to procedures.    . pantoprazole (PROTONIX) 40 MG tablet Take 40 mg by mouth 2 (two) times daily.  0  . saccharomyces boulardii ([employer] DIGESTIVE PROBIOTIC) 250 MG capsule Take 250 mg by mouth every morning.    [name] spironolactone (ALDACTONE) 25 MG tablet TAKE 1/2 TABLET BY MOUTH EVERY DAY 45 tablet 3  . zolpidem (AMBIEN CR) 12.5 MG CR tablet Take 1 tablet (12.5 mg total) by mouth at bedtime as needed for sleep. 90 tablet 0   Allergies Allergies  Allergen Reactions  . Celecoxib     hyperkalemia  . Losartan     hyperkalemia  . Ramipril     Cough     Family History Family  History  Problem Relation Age of Onset  . Hypertension Father     Review of Systems: Constitutional:  no fevers or chills Eye:  no recent significant change in vision Ear/Nose/Mouth/Throat:  Ears:  no recent hearing loss Nose/Mouth/Throat:  no complaints of nasal congestion or sore throat Cardiovascular:  no chest pain, no palpitations Respiratory:  no cough and no shortness of breath Gastrointestinal:  no abdominal pain, no change in bowel habits GU:  Male: negative for dysuria, frequency, and incontinence and negative for prostate symptoms Musculoskeletal/Extremities: +foot pain (recent surgery); no pain, redness, or swelling of the joints Integumentary (Skin):  no abnormal skin lesions reported Neurologic:  no headaches, Endocrine:  No unexpected weight changes Hematologic/Lymphatic:  no areas of easy bruising  Exam BP 122/72 (BP Location: Left Arm, Patient Position: Sitting, Cuff Size: Normal)   Pulse 99   Temp 97.9 F (36.6 C) (Oral)   Ht 5\' 10"  (1.778 m)   Wt 184 lb 2 oz (83.5 kg)   SpO2 98%   BMI 26.42 kg/m  General:  well developed, well nourished, in no apparent distress Skin:  no significant moles, warts, or growths Head:  no masses, lesions, or tenderness Eyes:  pupils equal and round, sclera anicteric without injection Ears:  canals without lesions, TMs shiny without retraction, no obvious effusion, no erythema Nose:  nares patent, septum midline, mucosa normal Throat/Pharynx:  lips and gingiva without lesion; tongue and uvula midline; non-inflamed pharynx; no exudates or postnasal drainage Neck: neck supple without adenopathy, thyromegaly, or masses Lungs:  clear to auscultation, breath sounds equal bilaterally, no respiratory distress Cardio:  regular rate and rhythm, no LE edema or bruits Abdomen:  abdomen soft, nontender; bowel sounds normal; no masses or organomegaly Rectal: Deferred Musculoskeletal:  symmetrical muscle groups noted without atrophy or  deformity Extremities:  no clubbing, cyanosis, or edema, no deformities, no skin discoloration Neuro:  gait normal; deep tendon reflexes normal and symmetric Psych: well oriented with normal range of affect and appropriate judgment/insight  Assessment and Plan  Well adult exam - Plan: CBC, Comprehensive metabolic panel, Lipid panel  Diabetes mellitus type 2 in nonobese (HCC) - Plan: Hemoglobin A1c, Microalbumin / creatinine urine ratio  Encounter for hepatitis C screening test for low risk patient - Plan: Hepatitis C antibody  Need for tetanus booster - Plan: Tdap vaccine greater than or equal to 21yo IM   Well 73 y.o. male. Counseled on diet and exercise. Pt's colonoscopy was pushed back 2/2 pandemic.  Other orders as above. Follow up in 6 mo pending above.  The patient voiced understanding and agreement to the plan.  Jilda Rocheicholas Paul [place], DO 07/14/19 8:30 AM

## 2019-07-14 NOTE — Patient Instructions (Signed)
Give us 2-3 business days to get the results of your labs back.   Keep the diet clean and stay active.  Let us know if you need anything. 

## 2019-07-15 ENCOUNTER — Encounter: Payer: Self-pay | Admitting: Family Medicine

## 2019-07-15 ENCOUNTER — Other Ambulatory Visit (INDEPENDENT_AMBULATORY_CARE_PROVIDER_SITE_OTHER): Payer: BLUE CROSS/BLUE SHIELD

## 2019-07-15 DIAGNOSIS — D509 Iron deficiency anemia, unspecified: Secondary | ICD-10-CM | POA: Diagnosis not present

## 2019-07-15 LAB — FERRITIN: Ferritin: 20 ng/mL — ABNORMAL LOW (ref 22.0–322.0)

## 2019-07-15 LAB — IBC PANEL
Iron: 47 ug/dL (ref 42–165)
Saturation Ratios: 10.4 % — ABNORMAL LOW (ref 20.0–50.0)
Transferrin: 322 mg/dL (ref 212.0–360.0)

## 2019-07-15 LAB — HEPATITIS C ANTIBODY
Hepatitis C Ab: NONREACTIVE
SIGNAL TO CUT-OFF: 0.01 (ref <1.00)

## 2019-07-15 LAB — IRON: Iron: 47 ug/dL (ref 42–165)

## 2019-07-24 ENCOUNTER — Other Ambulatory Visit: Payer: Self-pay

## 2019-07-24 ENCOUNTER — Ambulatory Visit (INDEPENDENT_AMBULATORY_CARE_PROVIDER_SITE_OTHER): Payer: BC Managed Care – PPO | Admitting: Psychiatry

## 2019-07-24 ENCOUNTER — Encounter: Payer: Self-pay | Admitting: Psychiatry

## 2019-07-24 DIAGNOSIS — F3341 Major depressive disorder, recurrent, in partial remission: Secondary | ICD-10-CM | POA: Diagnosis not present

## 2019-07-24 DIAGNOSIS — F411 Generalized anxiety disorder: Secondary | ICD-10-CM | POA: Diagnosis not present

## 2019-07-24 DIAGNOSIS — F5101 Primary insomnia: Secondary | ICD-10-CM | POA: Diagnosis not present

## 2019-07-24 MED ORDER — ZOLPIDEM TARTRATE ER 12.5 MG PO TBCR: 12.5000 mg | EXTENDED_RELEASE_TABLET | Freq: Every evening | ORAL | 1 refills | Status: DC | PRN

## 2019-07-24 MED ORDER — DULOXETINE HCL 60 MG PO CPEP: 60.0000 mg | ORAL_CAPSULE | Freq: Every day | ORAL | 1 refills | Status: DC

## 2019-07-24 MED ORDER — ALPRAZOLAM 0.25 MG PO TABS: ORAL_TABLET | ORAL | 1 refills | Status: DC

## 2019-07-24 NOTE — Progress Notes (Signed)
Alec Taylor [id number] [birth year] 73 y.o.  Subjective:   Patient ID:  Alec Taylor is a 73 y.o. (DOB 05-22-1946) male.  Chief Complaint:  Chief Complaint  Patient presents with  . Follow-up    h/o Depression, Anxiety, Insomnia    HPI Griezmann Kulczyk presents to the office today for follow-up of depression, insomnia, and anxiety. He had toe amputation July 17. Has resumed Cymbalta since last visit- "that's changed my attitude." He reports that this has been helpful for his mood and anxiety. He reports "a little bit" of depression related to health issues and decreased mobility that is interfering with his hobbies. He reports that his irritability has improved and his wife has also noted improvement in irritability. Appetite is improving after surgery and antibiotics. He reports improved energy and motivation. Reports some concentration difficulties to include remembering people's names. Denies SI.  He reports that he has less anxiety with stepping away from work. 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 He reports that he is spending most of his time at home and has limited interaction with others.   Daughter has been doing well and is working on her [employer], teaching part-time, and helping with the family HR.   He reports taking Xanax prn about once a week.    Past Psychiatric Medication Trials: Cymbalta Aricept Wellbutrin BuSpar Ambien Ambien CR Xanax Klonopin   Review of Systems:  Review of Systems  Musculoskeletal: Positive for gait problem.       Had another toe amputated and has now had 2 toes amputated.   Neurological: Negative for tremors and headaches.  Psychiatric/Behavioral:       Please refer to HPI  Had physical exam last week.   Medications: I have reviewed the patient's current medications.  Current Outpatient Medications  Medication Sig Dispense Refill  . ALPRAZolam (XANAX) 0.25 MG tablet TAKE ONE-HALF TO ONE TABLET BY MOUTH ONE TIME DAILY AS NEEDED  FOR ANXIETY 30 tablet 1  . ammonium lactate (LAC-HYDRIN) 12 % lotion Apply topically 2 (two) times daily as needed. To feet  5  . aspirin EC 81 MG tablet Take 81 mg by mouth daily.    [name] atorvastatin (LIPITOR) 20 MG tablet Take 1 tablet (20 mg total) by mouth daily. 90 tablet 3  . carvedilol (COREG) 6.25 MG tablet Take 0.5 tablets (3.125 mg total) by mouth 2 (two) times daily with a meal. 60 tablet 6  . Cholecalciferol (VITAMIN D3) 5000 units CAPS Take 5,000 Units by mouth 2 (two) times daily.    . DULoxetine (CYMBALTA) 60 MG capsule Take 1 capsule (60 mg total) by mouth daily. 90 capsule 1  . ENTRESTO 49-51 MG TAKE 1 TABLET BY MOUTH TWICE A DAY 60 tablet 11  . finasteride (PROSCAR) 5 MG tablet Take 5 mg by mouth every evening.   6  . folic acid (FOLVITE) 1 MG tablet Take 1 mg by mouth daily.  2  . glimepiride (AMARYL) 2 MG tablet Take 2 mg by mouth every evening.  1  . JARDIANCE 10 MG TABS tablet TAKE 1 TABLET BY MOUTH EVERY DAY 30 tablet 6  . metFORMIN (GLUCOPHAGE-XR) 500 MG 24 hr tablet Take 500 mg by mouth 2 (two) times daily.  1  . methotrexate (RHEUMATREX) 2.5 MG tablet Take 12.5 mg by mouth once a week. On Wednesday  3  . Multiple Vitamins-Minerals (MULTIVITAMIN ADULT PO) Take 1 tablet by mouth every morning.    [name] ofloxacin (OCUFLOX) 0.3 % ophthalmic solution Place  1 drop into both eyes 4 (four) times daily. Prior to procedures.    . pantoprazole (PROTONIX) 40 MG tablet Take 40 mg by mouth 2 (two) times daily.  0  . saccharomyces boulardii ([employer]S DIGESTIVE PROBIOTIC) 250 MG capsule Take 250 mg by mouth every morning.    [name] spironolactone (ALDACTONE) 25 MG tablet TAKE 1/2 TABLET BY MOUTH EVERY DAY 45 tablet 3  . zolpidem (AMBIEN CR) 12.5 MG CR tablet Take 1 tablet (12.5 mg total) by mouth at bedtime as needed for sleep. 90 tablet 1   No current facility-administered medications for this visit.     Medication Side Effects: None  Allergies:  Allergies  Allergen Reactions  . Celecoxib      hyperkalemia  . Losartan     hyperkalemia  . Ramipril     Cough     Past Medical History:  Diagnosis Date  . CAD (coronary artery disease) 09/2007   s/p CABG approximately 2004-[facility] (Cath - 17 Sep 2007:90% prox LAD with diffuse 85% narrowing to distal segment, 80% prox CXA, RCA: prox 90%, prox-mid 70%, mid 85%, distal 80%, D1 80%; Normal EF 65% --status post CABG--LIMA to LAD, SVG to PDA.    [name] COPD (chronic obstructive pulmonary disease) ([place])   . Depression   . Diabetes mellitus without complication ([place])   . Hypertension   . Ischemic cardiomyopathy   . Pleural effusion   . S/P CABG x 2    CABG--LIMA to LAD, SVG to PDA.      Family History  Problem Relation Age of Onset  . Hypertension Father     Social History   Socioeconomic History  . Marital status: Legally Separated    Spouse name: Not on file  . Number of children: Not on file  . Years of education: Not on file  . Highest education level: Not on file  Occupational History  . Not on file  Social Needs  . Financial resource strain: Not on file  . Food insecurity    Worry: Not on file    Inability: Not on file  . Transportation needs    Medical: Not on file    Non-medical: Not on file  Tobacco Use  . Smoking status: Never Smoker  . Smokeless tobacco: Never Used  Substance and Sexual Activity  . Alcohol use: Yes    Comment: occ  . Drug use: Never  . Sexual activity: Not on file  Lifestyle  . Physical activity    Days per week: Not on file    Minutes per session: Not on file  . Stress: Not on file  Relationships  . Social Herbalist on phone: Not on file    Gets together: Not on file    Attends religious service: Not on file    Active member of club or organization: Not on file    Attends meetings of clubs or organizations: Not on file    Relationship status: Not on file  . Intimate partner violence    Fear of current or ex partner: Not on file    Emotionally abused: Not on file     Physically abused: Not on file    Forced sexual activity: Not on file  Other Topics Concern  . Not on file  Social History Narrative  . Not on file    Past Medical History, Surgical history, Social history, and Family history were reviewed and updated as appropriate.   Please see review of systems  for further details on the patient's review from today.   Objective:   Physical Exam:  BP 120/64   Pulse 90   Physical Exam Constitutional:      General: He is not in acute distress.    Appearance: He is well-developed.  Musculoskeletal:        General: No deformity.  Neurological:     Mental Status: He is alert and oriented to person, place, and time.     Coordination: Coordination normal.  Psychiatric:        Attention and Perception: Attention and perception normal. He does not perceive auditory or visual hallucinations.        Mood and Affect: Mood normal. Mood is not anxious or depressed. Affect is not labile, blunt, angry or inappropriate.        Speech: Speech normal.        Behavior: Behavior normal.        Thought Content: Thought content normal. Thought content does not include homicidal or suicidal ideation. Thought content does not include homicidal or suicidal plan.        Cognition and Memory: Cognition and memory normal.        Judgment: Judgment normal.     Comments: Insight intact. No delusions.      Lab Review:     Component Value Date/Time   NA 139 07/14/2019 0826   NA 140 07/08/2018 0926   K 5.0 07/14/2019 0826   CL 103 07/14/2019 0826   CO2 29 07/14/2019 0826   GLUCOSE 136 (H) 07/14/2019 0826   BUN 41 (H) 07/14/2019 0826   BUN 27 07/08/2018 0926   CREATININE 1.07 07/14/2019 0826   CALCIUM 9.2 07/14/2019 0826   PROT 6.7 07/14/2019 0826   ALBUMIN 4.2 07/14/2019 0826   AST 32 07/14/2019 0826   ALT 43 07/14/2019 0826   ALKPHOS 65 07/14/2019 0826   BILITOT 0.3 07/14/2019 0826   GFRNONAA >60 12/15/2018 1021   GFRAA >60 12/15/2018 1021        Component Value Date/Time   WBC 7.0 07/14/2019 0826   RBC 4.48 07/14/2019 0826   HGB 11.7 (L) 07/14/2019 0826   HCT 37.0 (L) 07/14/2019 0826   PLT 204.0 07/14/2019 0826   MCV 82.5 07/14/2019 0826   MCH 28.5 12/15/2018 1021   MCHC 31.8 07/14/2019 0826   RDW 17.6 (H) 07/14/2019 0826   LYMPHSABS 1.2 09/02/2018 1020   MONOABS 0.5 09/02/2018 1020   EOSABS 0.2 09/02/2018 1020   BASOSABS 0.0 09/02/2018 1020    No results found for: POCLITH, LITHIUM   No results found for: PHENYTOIN, PHENOBARB, VALPROATE, CBMZ   .res Assessment: Plan:   Will continue current plan of care. Patient to follow-up in 3 to 4 months or sooner if clinically indicated. Patient advised to contact office with any questions, adverse effects, or acute worsening in signs and symptoms.  Beth was seen today for follow-up.  Diagnoses and all orders for this visit:  Generalized anxiety disorder -     ALPRAZolam (XANAX) 0.25 MG tablet; TAKE ONE-HALF TO ONE TABLET BY MOUTH ONE TIME DAILY AS NEEDED FOR ANXIETY -     DULoxetine (CYMBALTA) 60 MG capsule; Take 1 capsule (60 mg total) by mouth daily.  Recurrent major depressive disorder, in partial remission (HCC) -     DULoxetine (CYMBALTA) 60 MG capsule; Take 1 capsule (60 mg total) by mouth daily.  Primary insomnia -     zolpidem (AMBIEN CR) 12.5 MG CR tablet; Take  1 tablet (12.5 mg total) by mouth at bedtime as needed for sleep.     Please see After Visit Summary for patient specific instructions.  Future Appointments  Date Time Provider Department Center  10/23/2019  9:30 AM Corie Chiquito, PMHNP [facility] None  01/15/2020  8:00 AM Wendling, Jilda Roche, DO [facility]    No orders of the defined types were placed in this encounter.   -------------------------------

## 2019-07-28 DIAGNOSIS — E113513 Type 2 diabetes mellitus with proliferative diabetic retinopathy with macular edema, bilateral: Secondary | ICD-10-CM | POA: Diagnosis not present

## 2019-07-28 DIAGNOSIS — H4312 Vitreous hemorrhage, left eye: Secondary | ICD-10-CM | POA: Diagnosis not present

## 2019-07-28 DIAGNOSIS — H35373 Puckering of macula, bilateral: Secondary | ICD-10-CM | POA: Diagnosis not present

## 2019-08-25 DIAGNOSIS — L661 Lichen planopilaris: Secondary | ICD-10-CM | POA: Diagnosis not present

## 2019-08-25 DIAGNOSIS — L649 Androgenic alopecia, unspecified: Secondary | ICD-10-CM | POA: Diagnosis not present

## 2019-08-25 DIAGNOSIS — L639 Alopecia areata, unspecified: Secondary | ICD-10-CM | POA: Diagnosis not present

## 2019-09-14 DIAGNOSIS — Z89412 Acquired absence of left great toe: Secondary | ICD-10-CM | POA: Diagnosis not present

## 2019-09-16 DIAGNOSIS — E119 Type 2 diabetes mellitus without complications: Secondary | ICD-10-CM | POA: Diagnosis not present

## 2019-09-16 DIAGNOSIS — Z961 Presence of intraocular lens: Secondary | ICD-10-CM | POA: Diagnosis not present

## 2019-09-16 DIAGNOSIS — H353233 Exudative age-related macular degeneration, bilateral, with inactive scar: Secondary | ICD-10-CM | POA: Diagnosis not present

## 2019-09-22 DIAGNOSIS — E113513 Type 2 diabetes mellitus with proliferative diabetic retinopathy with macular edema, bilateral: Secondary | ICD-10-CM | POA: Diagnosis not present

## 2019-10-06 ENCOUNTER — Other Ambulatory Visit: Payer: Self-pay

## 2019-10-06 ENCOUNTER — Telehealth: Payer: Self-pay

## 2019-10-06 ENCOUNTER — Encounter: Payer: Self-pay | Admitting: Family Medicine

## 2019-10-06 ENCOUNTER — Ambulatory Visit (INDEPENDENT_AMBULATORY_CARE_PROVIDER_SITE_OTHER): Payer: BLUE CROSS/BLUE SHIELD | Admitting: Family Medicine

## 2019-10-06 DIAGNOSIS — Z20822 Contact with and (suspected) exposure to covid-19: Secondary | ICD-10-CM

## 2019-10-06 DIAGNOSIS — Z20828 Contact with and (suspected) exposure to other viral communicable diseases: Secondary | ICD-10-CM

## 2019-10-06 DIAGNOSIS — J069 Acute upper respiratory infection, unspecified: Secondary | ICD-10-CM | POA: Diagnosis not present

## 2019-10-06 MED ORDER — BENZONATATE 100 MG PO CAPS: 100.0000 mg | ORAL_CAPSULE | Freq: Three times a day (TID) | ORAL | 0 refills | Status: DC | PRN

## 2019-10-06 NOTE — Progress Notes (Signed)
CC: URI complaints  Alec Taylor here for URI complaints. Due to COVID-19 pandemic, we are interacting via telephone. I verified patient's ID using 2 identifiers. Patient agreed to proceed with visit via this method. Patient is at home, I am at office. Patient. spouse and I are present for visit.   Duration: 2 day  Associated symptoms: cough, fatigue, aches, runny nose Denies: sinus pain, ear pain, ear drainage, sore throat, wheezing, shortness of breath and fevers Treatment to date: nothing Sick contacts: Yes; daughter w covid-19  ROS:  Const: Denies fevers HEENT: As noted in HPI Lungs: No SOB  Past Medical History:  Diagnosis Date  . CAD (coronary artery disease) 09/2007   s/p CABG approximately 2004-[facility] (Cath - 17 Sep 2007:90% prox LAD with diffuse 85% narrowing to distal segment, 80% prox CXA, RCA: prox 90%, prox-mid 70%, mid 85%, distal 80%, D1 80%; Normal EF 65% --status post CABG--LIMA to LAD, SVG to PDA.    [name] COPD (chronic obstructive pulmonary disease) ([place])   . Depression   . Diabetes mellitus without complication ([place])   . Hypertension   . Ischemic cardiomyopathy   . Pleural effusion   . S/P CABG x 2    CABG--LIMA to LAD, SVG to PDA.     Exam No conversational dyspnea Age appropriate judgment and insight Nml affect and mood  URI with cough and congestion - Plan: benzonatate (TESSALON) 100 MG capsule, Novel Coronavirus, NAA ([facility])  Exposure to COVID-19 virus - Plan: Novel Coronavirus, NAA ([facility])  Check for Covid.  Tessalon Perles.  Warning signs and symptoms verbalized. Continue to push fluids, practice good hand hygiene, cover mouth when coughing. F/u prn. If starting to experience shortness of breath or worsening symptoms, seek care. Total time spent: 10:14 Pt and his spouse voiced understanding and agreement to the plan.  Ruth, DO 10/06/19 11:59 AM

## 2019-10-06 NOTE — Telephone Encounter (Signed)
Scheduled today

## 2019-10-06 NOTE — Telephone Encounter (Signed)
Copied from [place] [id number]. Topic: Appointment Scheduling - Scheduling Inquiry for Clinic >> Oct 06, 2019  7:28 AM Alec Taylor wrote: Reason for CRM: Pt would like an appt tomorrow for covid exposure /Call wife to schedule virtual appt  please advise

## 2019-10-08 ENCOUNTER — Encounter: Payer: Self-pay | Admitting: Family Medicine

## 2019-10-08 ENCOUNTER — Telehealth: Payer: Self-pay

## 2019-10-08 LAB — NOVEL CORONAVIRUS, NAA: SARS-CoV-2, NAA: DETECTED — AB

## 2019-10-08 NOTE — Telephone Encounter (Signed)
Spoke with Alec Taylor at Dr. Nani Ravens office. Advise her that I have not contact the patient regarding the result. Also routed that positive result to Dr. Nani Ravens office.

## 2019-10-08 NOTE — Telephone Encounter (Signed)
Talked to patient and advised him of results, He reports feeling  "well, just feels like I have allergies and I have some cough".  He will have his wife (who is also positive) call back later when she gets up for instructions.

## 2019-10-09 ENCOUNTER — Other Ambulatory Visit: Payer: Self-pay

## 2019-10-09 ENCOUNTER — Ambulatory Visit (INDEPENDENT_AMBULATORY_CARE_PROVIDER_SITE_OTHER): Payer: Self-pay | Admitting: Family Medicine

## 2019-10-09 ENCOUNTER — Encounter: Payer: Self-pay | Admitting: Family Medicine

## 2019-10-09 DIAGNOSIS — U071 COVID-19: Secondary | ICD-10-CM

## 2019-10-09 NOTE — Progress Notes (Signed)
Chief Complaint  Patient presents with  . Results    Covid    Alec Taylor here for URI complaints. Due to COVID-19 pandemic, we are interacting via telephone. I verified patient's ID using 2 identifiers. Patient agreed to proceed with visit via this method. Patient is at home, I am at office. Patient, his wife, and I are present for visit.   Patient following up for positive Covid test.  Both he his wife tested positive after spending time with their daughter.  Currently neither he nor his wife are having any symptoms.  They are wondering if they are able to quarantine with her daughter who is feeling better as well.  No fevers.  They know that they need to quarantine until day 10 and be fever free for 24 hours without any antipyretics.  ROS:  Const: Denies fevers HEENT: As noted in HPI Lungs: No SOB  Past Medical History:  Diagnosis Date  . CAD (coronary artery disease) 09/2007   s/p CABG approximately 2004-[facility] (Cath - 17 Sep 2007:90% prox LAD with diffuse 85% narrowing to distal segment, 80% prox CXA, RCA: prox 90%, prox-mid 70%, mid 85%, distal 80%, D1 80%; Normal EF 65% --status post CABG--LIMA to LAD, SVG to PDA.    [name] COPD (chronic obstructive pulmonary disease) ([place])   . Depression   . Diabetes mellitus without complication ([place])   . Hypertension   . Ischemic cardiomyopathy   . Pleural effusion   . S/P CABG x 2    CABG--LIMA to LAD, SVG to PDA.     Exam No conversational dyspnea Age appropriate judgment and insight Nml affect and mood  COVID-19  Spent time discussing finishing up quarantine, spending time with daughter, and prognosis.  Both he and his wife are likely in the clear as all of the symptoms have resolved and they are back to the baseline functioning.  No further medications required. Total time: 10:42 F/u prn. If starting to experience fevers, shaking, or shortness of breath, seek immediate care. Pt voiced understanding and agreement to the plan.  [place], DO 10/09/19 2:28 PM

## 2019-10-12 ENCOUNTER — Ambulatory Visit: Payer: BLUE CROSS/BLUE SHIELD | Admitting: Family Medicine

## 2019-10-23 ENCOUNTER — Encounter: Payer: Self-pay | Admitting: Psychiatry

## 2019-10-23 ENCOUNTER — Other Ambulatory Visit: Payer: Self-pay

## 2019-10-23 ENCOUNTER — Ambulatory Visit (INDEPENDENT_AMBULATORY_CARE_PROVIDER_SITE_OTHER): Payer: BC Managed Care – PPO | Admitting: Psychiatry

## 2019-10-23 DIAGNOSIS — F3341 Major depressive disorder, recurrent, in partial remission: Secondary | ICD-10-CM

## 2019-10-23 DIAGNOSIS — F411 Generalized anxiety disorder: Secondary | ICD-10-CM

## 2019-10-23 DIAGNOSIS — F5101 Primary insomnia: Secondary | ICD-10-CM | POA: Diagnosis not present

## 2019-10-23 MED ORDER — DULOXETINE HCL 60 MG PO CPEP: 60.0000 mg | ORAL_CAPSULE | Freq: Every day | ORAL | 0 refills | Status: DC

## 2019-10-23 NOTE — Progress Notes (Signed)
Alec Taylor [id number] Oct 01, 1946 73 y.o.  Virtual Visit via Telephone Note  I connected with pt on 10/23/19 at  9:30 AM EST by telephone and verified that I am speaking with the correct person using two identifiers.   I discussed the limitations, risks, security and privacy concerns of performing an evaluation and management service by telephone and the availability of in person appointments. I also discussed with the patient that there may be a patient responsible charge related to this service. The patient expressed understanding and agreed to proceed.   I discussed the assessment and treatment plan with the patient. The patient was provided an opportunity to ask questions and all were answered. The patient agreed with the plan and demonstrated an understanding of the instructions.   The patient was advised to call back or seek an in-person evaluation if the symptoms worsen or if the condition fails to improve as anticipated.  I provided 20 minutes of non-face-to-face time during this encounter.  The patient was located at home.  The provider was located at [facility].   Corie Chiquito, PMHNP   Subjective:   Patient ID:  Alec Taylor is a 73 y.o. (DOB May 17, 1946) male.  Chief Complaint: No chief complaint on file.   HPI Alec Taylor presents for follow-up of anxiety, depression, and insomnia. He reports that he and his wife and daughter had COVID about 4 weeks ago. He reports that daughter was sicker longer than pt and his wife had mild s/s. He reports that he feels his health is back to baseline. He reports that he has has "had some bouts with loneliness." He reports mood is at baseline. He reports that he has been sleeping well with Ambien. He reports that he takes Xanax prn about once a week for situational anxiety when there have been some stressful events at work. Low energy and motivation. Appetite has been good. He reports that concentration has been "not good."  Reports that he continues to adjust to loss of his toes and has not been playing golf. Enjoys his [place] Bulldog, [place]. Denies SI.   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 Reports that his business has been going well.   Past Psychiatric Medication Trials: Cymbalta Aricept Wellbutrin BuSpar Ambien Ambien CR Xanax Klonopin  Review of Systems:  Review of Systems  Respiratory: Negative for shortness of breath.   Musculoskeletal: Negative for gait problem.  Neurological: Negative for tremors.  Psychiatric/Behavioral:       Please refer to HPI    Medications: I have reviewed the patient's current medications.  Current Outpatient Medications  Medication Sig Dispense Refill  . ALPRAZolam (XANAX) 0.25 MG tablet TAKE ONE-HALF TO ONE TABLET BY MOUTH ONE TIME DAILY AS NEEDED FOR ANXIETY 30 tablet 1  . ammonium lactate (LAC-HYDRIN) 12 % lotion Apply topically 2 (two) times daily as needed. To feet  5  . aspirin EC 81 MG tablet Take 81 mg by mouth daily.    [name] atorvastatin (LIPITOR) 20 MG tablet Take 1 tablet (20 mg total) by mouth daily. 90 tablet 3  . benzonatate (TESSALON) 100 MG capsule Take 1 capsule (100 mg total) by mouth 3 (three) times daily as needed. 30 capsule 0  . carvedilol (COREG) 6.25 MG tablet Take 0.5 tablets (3.125 mg total) by mouth 2 (two) times daily with a meal. 60 tablet 6  . Cholecalciferol (VITAMIN D3) 5000 units CAPS Take 5,000 Units by mouth 2 (two) times daily.    [name]  DULoxetine (CYMBALTA) 60 MG capsule Take 1 capsule (60 mg total) by mouth daily. 90 capsule 0  . ENTRESTO 49-51 MG TAKE 1 TABLET BY MOUTH TWICE A DAY 60 tablet 11  . finasteride (PROSCAR) 5 MG tablet Take 5 mg by mouth every evening.   6  . folic acid (FOLVITE) 1 MG tablet Take 1 mg by mouth daily.  2  . glimepiride (AMARYL) 2 MG tablet Take 2 mg by mouth every evening.  1  . JARDIANCE 10 MG TABS tablet TAKE 1 TABLET BY MOUTH EVERY DAY 30 tablet 6  . metFORMIN (GLUCOPHAGE-XR) 500 MG  24 hr tablet Take 500 mg by mouth 2 (two) times daily.  1  . methotrexate (RHEUMATREX) 2.5 MG tablet Take 12.5 mg by mouth once a week. On Wednesday  3  . Multiple Vitamins-Minerals (MULTIVITAMIN ADULT PO) Take 1 tablet by mouth every morning.    [name] ofloxacin (OCUFLOX) 0.3 % ophthalmic solution Place 1 drop into both eyes 4 (four) times daily. Prior to procedures.    . pantoprazole (PROTONIX) 40 MG tablet Take 40 mg by mouth 2 (two) times daily.  0  . saccharomyces boulardii ([employer] DIGESTIVE PROBIOTIC) 250 MG capsule Take 250 mg by mouth every morning.    [name] spironolactone (ALDACTONE) 25 MG tablet TAKE 1/2 TABLET BY MOUTH EVERY DAY 45 tablet 3  . zolpidem (AMBIEN CR) 12.5 MG CR tablet Take 1 tablet (12.5 mg total) by mouth at bedtime as needed for sleep. 90 tablet 1   No current facility-administered medications for this visit.     Medication Side Effects: None  Allergies:  Allergies  Allergen Reactions  . Celecoxib     hyperkalemia  . Losartan     hyperkalemia  . Ramipril     Cough     Past Medical History:  Diagnosis Date  . CAD (coronary artery disease) 09/2007   s/p CABG approximately 2004-[facility] (Cath - 17 Sep 2007:90% prox LAD with diffuse 85% narrowing to distal segment, 80% prox CXA, RCA: prox 90%, prox-mid 70%, mid 85%, distal 80%, D1 80%; Normal EF 65% --status post CABG--LIMA to LAD, SVG to PDA.    [name] COPD (chronic obstructive pulmonary disease) (HCC)   . Depression   . Diabetes mellitus without complication (HCC)   . Hypertension   . Ischemic cardiomyopathy   . Pleural effusion   . S/P CABG x 2    CABG--LIMA to LAD, SVG to PDA.      Family History  Problem Relation Age of Onset  . Hypertension Father     Social History   Socioeconomic History  . Marital status: Legally Separated    Spouse name: Not on file  . Number of children: Not on file  . Years of education: Not on file  . Highest education level: Not on file  Occupational History  . Not on file  Social  Needs  . Financial resource strain: Not on file  . Food insecurity    Worry: Not on file    Inability: Not on file  . Transportation needs    Medical: Not on file    Non-medical: Not on file  Tobacco Use  . Smoking status: Never Smoker  . Smokeless tobacco: Never Used  Substance and Sexual Activity  . Alcohol use: Yes    Comment: occ  . Drug use: Never  . Sexual activity: Not on file  Lifestyle  . Physical activity    Days per week: Not on file  Minutes per session: Not on file  . Stress: Not on file  Relationships  . Social Musicianconnections    Talks on phone: Not on file    Gets together: Not on file    Attends religious service: Not on file    Active member of club or organization: Not on file    Attends meetings of clubs or organizations: Not on file    Relationship status: Not on file  . Intimate partner violence    Fear of current or ex partner: Not on file    Emotionally abused: Not on file    Physically abused: Not on file    Forced sexual activity: Not on file  Other Topics Concern  . Not on file  Social History Narrative  . Not on file    Past Medical History, Surgical history, Social history, and Family history were reviewed and updated as appropriate.   Please see review of systems for further details on the patient's review from today.   Objective:   Physical Exam:  There were no vitals taken for this visit.  Physical Exam Neurological:     Mental Status: He is alert and oriented to person, place, and time.     Cranial Nerves: No dysarthria.  Psychiatric:        Attention and Perception: Attention normal.        Mood and Affect: Mood normal.        Speech: Speech normal.        Behavior: Behavior is cooperative.        Thought Content: Thought content normal. Thought content is not paranoid or delusional. Thought content does not include homicidal or suicidal ideation. Thought content does not include homicidal or suicidal plan.        Cognition and  Memory: Cognition and memory normal.        Judgment: Judgment normal.     Comments: Insight intact     Lab Review:     Component Value Date/Time   NA 139 07/14/2019 0826   NA 140 07/08/2018 0926   K 5.0 07/14/2019 0826   CL 103 07/14/2019 0826   CO2 29 07/14/2019 0826   GLUCOSE 136 (H) 07/14/2019 0826   BUN 41 (H) 07/14/2019 0826   BUN 27 07/08/2018 0926   CREATININE 1.07 07/14/2019 0826   CALCIUM 9.2 07/14/2019 0826   PROT 6.7 07/14/2019 0826   ALBUMIN 4.2 07/14/2019 0826   AST 32 07/14/2019 0826   ALT 43 07/14/2019 0826   ALKPHOS 65 07/14/2019 0826   BILITOT 0.3 07/14/2019 0826   GFRNONAA >60 12/15/2018 1021   GFRAA >60 12/15/2018 1021       Component Value Date/Time   WBC 7.0 07/14/2019 0826   RBC 4.48 07/14/2019 0826   HGB 11.7 (L) 07/14/2019 0826   HCT 37.0 (L) 07/14/2019 0826   PLT 204.0 07/14/2019 0826   MCV 82.5 07/14/2019 0826   MCH 28.5 12/15/2018 1021   MCHC 31.8 07/14/2019 0826   RDW 17.6 (H) 07/14/2019 0826   LYMPHSABS 1.2 09/02/2018 1020   MONOABS 0.5 09/02/2018 1020   EOSABS 0.2 09/02/2018 1020   BASOSABS 0.0 09/02/2018 1020    No results found for: POCLITH, LITHIUM   No results found for: PHENYTOIN, PHENOBARB, VALPROATE, CBMZ   .res Assessment: Plan:   Will continue current plan of care since target signs and symptoms are well controlled without any tolerability issues. Continue Xanax 0.25 mg 1/2-1 tab po qd prn anxiety. Continue Ambien  CR 12.5 mg po QHS for insomnia.  Pt to f/u in 3 months or sooner if clinically indicated.  Patient advised to contact office with any questions, adverse effects, or acute worsening in signs and symptoms.  Diagnoses and all orders for this visit:  Recurrent major depressive disorder, in partial remission ([place]) -     DULoxetine (CYMBALTA) 60 MG capsule; Take 1 capsule (60 mg total) by mouth daily.  Generalized anxiety disorder -     DULoxetine (CYMBALTA) 60 MG capsule; Take 1 capsule (60 mg total) by mouth  daily.  Primary insomnia    Please see After Visit Summary for patient specific instructions.  Future Appointments  Date Time Provider [place]  01/15/2020  8:00 AM Wendling, Crosby Oyster, DO [facility]    No orders of the defined types were placed in this encounter.     -------------------------------

## 2019-10-26 ENCOUNTER — Ambulatory Visit: Payer: BLUE CROSS/BLUE SHIELD | Admitting: Family Medicine

## 2019-10-27 DIAGNOSIS — D508 Other iron deficiency anemias: Secondary | ICD-10-CM | POA: Diagnosis not present

## 2019-10-27 DIAGNOSIS — K219 Gastro-esophageal reflux disease without esophagitis: Secondary | ICD-10-CM | POA: Diagnosis not present

## 2019-10-27 DIAGNOSIS — Z86010 Personal history of colonic polyps: Secondary | ICD-10-CM | POA: Diagnosis not present

## 2019-10-30 ENCOUNTER — Other Ambulatory Visit: Payer: Self-pay | Admitting: Psychiatry

## 2019-10-30 DIAGNOSIS — F5101 Primary insomnia: Secondary | ICD-10-CM

## 2019-10-30 MED ORDER — ZOLPIDEM TARTRATE ER 12.5 MG PO TBCR: 12.5000 mg | EXTENDED_RELEASE_TABLET | Freq: Every evening | ORAL | 1 refills | Status: DC | PRN

## 2019-10-30 NOTE — Telephone Encounter (Signed)
Left pt. A detailed message that his med was sent in and to call back with any questions.

## 2019-10-30 NOTE — Telephone Encounter (Signed)
Pt is out of Ambien. Is requesting an early RF because he can't fill until 11/26. If so please send to [facility] in [facility].

## 2019-11-17 DIAGNOSIS — M2022 Hallux rigidus, left foot: Secondary | ICD-10-CM | POA: Diagnosis not present

## 2019-11-17 DIAGNOSIS — M2042 Other hammer toe(s) (acquired), left foot: Secondary | ICD-10-CM | POA: Diagnosis not present

## 2019-11-27 ENCOUNTER — Other Ambulatory Visit (HOSPITAL_COMMUNITY): Payer: Self-pay | Admitting: Internal Medicine

## 2019-12-14 DIAGNOSIS — D125 Benign neoplasm of sigmoid colon: Secondary | ICD-10-CM | POA: Diagnosis not present

## 2019-12-14 DIAGNOSIS — K5140 Inflammatory polyps of colon without complications: Secondary | ICD-10-CM | POA: Diagnosis not present

## 2019-12-14 DIAGNOSIS — D123 Benign neoplasm of transverse colon: Secondary | ICD-10-CM | POA: Diagnosis not present

## 2019-12-14 DIAGNOSIS — Z1211 Encounter for screening for malignant neoplasm of colon: Secondary | ICD-10-CM | POA: Diagnosis not present

## 2019-12-14 DIAGNOSIS — D122 Benign neoplasm of ascending colon: Secondary | ICD-10-CM | POA: Diagnosis not present

## 2019-12-14 DIAGNOSIS — Z86010 Personal history of colonic polyps: Secondary | ICD-10-CM | POA: Diagnosis not present

## 2019-12-14 DIAGNOSIS — D124 Benign neoplasm of descending colon: Secondary | ICD-10-CM | POA: Diagnosis not present

## 2019-12-31 DIAGNOSIS — Z23 Encounter for immunization: Secondary | ICD-10-CM | POA: Diagnosis not present

## 2020-01-07 ENCOUNTER — Other Ambulatory Visit: Payer: Self-pay | Admitting: Psychiatry

## 2020-01-07 DIAGNOSIS — F411 Generalized anxiety disorder: Secondary | ICD-10-CM

## 2020-01-11 DIAGNOSIS — E118 Type 2 diabetes mellitus with unspecified complications: Secondary | ICD-10-CM | POA: Diagnosis not present

## 2020-01-11 DIAGNOSIS — I739 Peripheral vascular disease, unspecified: Secondary | ICD-10-CM | POA: Diagnosis not present

## 2020-01-11 DIAGNOSIS — R5383 Other fatigue: Secondary | ICD-10-CM | POA: Diagnosis not present

## 2020-01-11 DIAGNOSIS — M791 Myalgia, unspecified site: Secondary | ICD-10-CM | POA: Diagnosis not present

## 2020-01-11 DIAGNOSIS — E119 Type 2 diabetes mellitus without complications: Secondary | ICD-10-CM | POA: Diagnosis not present

## 2020-01-11 DIAGNOSIS — I5022 Chronic systolic (congestive) heart failure: Secondary | ICD-10-CM | POA: Diagnosis not present

## 2020-01-11 DIAGNOSIS — R809 Proteinuria, unspecified: Secondary | ICD-10-CM | POA: Diagnosis not present

## 2020-01-11 DIAGNOSIS — E1140 Type 2 diabetes mellitus with diabetic neuropathy, unspecified: Secondary | ICD-10-CM | POA: Diagnosis not present

## 2020-01-11 DIAGNOSIS — I10 Essential (primary) hypertension: Secondary | ICD-10-CM | POA: Diagnosis not present

## 2020-01-11 DIAGNOSIS — E782 Mixed hyperlipidemia: Secondary | ICD-10-CM | POA: Diagnosis not present

## 2020-01-12 ENCOUNTER — Telehealth (HOSPITAL_COMMUNITY): Payer: Self-pay | Admitting: Pharmacy Technician

## 2020-01-12 NOTE — Telephone Encounter (Signed)
Patient's co-pay of [name] is $220.19. (90 day supply on future fills)  Pharmacy Billing Information  BIN: [id number]  PCN: [id number]  Group: [id number]  ID: [id number]  Called and spoke with patient's pharmacy. They cannot bill the claim until 2/5. Will do so at that time  Archer Asa, CPhT

## 2020-01-12 NOTE — Telephone Encounter (Signed)
Called and updated patient about his Entresto co-pay. He claimed to be having some swelling and weight gain and wondered if he should be seen. I advised him to speak with triage so they could get a message to the provider.   Transferred him to the clinic.  Alec Taylor, CPhT

## 2020-01-13 ENCOUNTER — Encounter (HOSPITAL_COMMUNITY): Payer: Self-pay | Admitting: Internal Medicine

## 2020-01-13 ENCOUNTER — Ambulatory Visit (HOSPITAL_COMMUNITY)
Admission: RE | Admit: 2020-01-13 | Discharge: 2020-01-13 | Disposition: A | Discharge status: Home / Self Care | Payer: Self-pay | Source: Ambulatory Visit | Attending: Internal Medicine | Admitting: Internal Medicine

## 2020-01-13 ENCOUNTER — Other Ambulatory Visit: Payer: Self-pay

## 2020-01-13 VITALS — BP 123/54 | HR 92 | Wt 192.6 lb

## 2020-01-13 DIAGNOSIS — Z951 Presence of aortocoronary bypass graft: Secondary | ICD-10-CM | POA: Insufficient documentation

## 2020-01-13 DIAGNOSIS — Z7984 Long term (current) use of oral hypoglycemic drugs: Secondary | ICD-10-CM | POA: Insufficient documentation

## 2020-01-13 DIAGNOSIS — Z8249 Family history of ischemic heart disease and other diseases of the circulatory system: Secondary | ICD-10-CM | POA: Insufficient documentation

## 2020-01-13 DIAGNOSIS — F329 Major depressive disorder, single episode, unspecified: Secondary | ICD-10-CM | POA: Insufficient documentation

## 2020-01-13 DIAGNOSIS — I5022 Chronic systolic (congestive) heart failure: Secondary | ICD-10-CM | POA: Insufficient documentation

## 2020-01-13 DIAGNOSIS — R079 Chest pain, unspecified: Secondary | ICD-10-CM | POA: Insufficient documentation

## 2020-01-13 DIAGNOSIS — Z79899 Other long term (current) drug therapy: Secondary | ICD-10-CM | POA: Insufficient documentation

## 2020-01-13 DIAGNOSIS — D649 Anemia, unspecified: Secondary | ICD-10-CM | POA: Insufficient documentation

## 2020-01-13 DIAGNOSIS — I110 Hypertensive heart disease with heart failure: Secondary | ICD-10-CM | POA: Insufficient documentation

## 2020-01-13 DIAGNOSIS — Z9884 Bariatric surgery status: Secondary | ICD-10-CM | POA: Insufficient documentation

## 2020-01-13 DIAGNOSIS — J449 Chronic obstructive pulmonary disease, unspecified: Secondary | ICD-10-CM | POA: Insufficient documentation

## 2020-01-13 DIAGNOSIS — Z7982 Long term (current) use of aspirin: Secondary | ICD-10-CM | POA: Insufficient documentation

## 2020-01-13 DIAGNOSIS — Z8774 Personal history of (corrected) congenital malformations of heart and circulatory system: Secondary | ICD-10-CM | POA: Insufficient documentation

## 2020-01-13 DIAGNOSIS — I255 Ischemic cardiomyopathy: Secondary | ICD-10-CM | POA: Insufficient documentation

## 2020-01-13 DIAGNOSIS — I2510 Atherosclerotic heart disease of native coronary artery without angina pectoris: Secondary | ICD-10-CM | POA: Insufficient documentation

## 2020-01-13 DIAGNOSIS — R05 Cough: Secondary | ICD-10-CM | POA: Insufficient documentation

## 2020-01-13 DIAGNOSIS — E669 Obesity, unspecified: Secondary | ICD-10-CM | POA: Insufficient documentation

## 2020-01-13 DIAGNOSIS — E119 Type 2 diabetes mellitus without complications: Secondary | ICD-10-CM | POA: Insufficient documentation

## 2020-01-13 DIAGNOSIS — E785 Hyperlipidemia, unspecified: Secondary | ICD-10-CM | POA: Insufficient documentation

## 2020-01-13 LAB — COMPREHENSIVE METABOLIC PANEL
ALT: 32 U/L (ref 0–44)
AST: 21 U/L (ref 15–41)
Albumin: 3.7 g/dL (ref 3.5–5.0)
Alkaline Phosphatase: 94 U/L (ref 38–126)
Anion gap: 12 (ref 5–15)
BUN: 36 mg/dL — ABNORMAL HIGH (ref 8–23)
CO2: 27 mmol/L (ref 22–32)
Calcium: 9.3 mg/dL (ref 8.9–10.3)
Chloride: 101 mmol/L (ref 98–111)
Creatinine, Ser: 1.14 mg/dL (ref 0.61–1.24)
GFR calc Af Amer: >60 mL/min (ref >60)
GFR calc non Af Amer: >60 mL/min (ref >60)
Glucose, Bld: 177 mg/dL — ABNORMAL HIGH (ref 70–99)
Potassium: 4.7 mmol/L (ref 3.5–5.1)
Sodium: 140 mmol/L (ref 135–145)
Total Bilirubin: 0.8 mg/dL (ref 0.3–1.2)
Total Protein: 6.9 g/dL (ref 6.5–8.1)

## 2020-01-13 LAB — CBC
HCT: 31.3 % — ABNORMAL LOW (ref 39.0–52.0)
Hemoglobin: 9.2 g/dL — ABNORMAL LOW (ref 13.0–17.0)
MCH: 24.6 pg — ABNORMAL LOW (ref 26.0–34.0)
MCHC: 29.4 g/dL — ABNORMAL LOW (ref 30.0–36.0)
MCV: 83.7 fL (ref 80.0–100.0)
Platelets: 265 10*3/uL (ref 150–400)
RBC: 3.74 MIL/uL — ABNORMAL LOW (ref 4.22–5.81)
RDW: 18.3 % — ABNORMAL HIGH (ref 11.5–15.5)
WBC: 7 10*3/uL (ref 4.0–10.5)
nRBC: 0 % (ref 0.0–0.2)

## 2020-01-13 LAB — BRAIN NATRIURETIC PEPTIDE: B Natriuretic Peptide: 1229.7 pg/mL — ABNORMAL HIGH (ref 0.0–100.0)

## 2020-01-13 MED ORDER — FUROSEMIDE 20 MG PO TABS: 20.0000 mg | ORAL_TABLET | ORAL | 11 refills | Status: DC

## 2020-01-13 MED ORDER — POTASSIUM CHLORIDE CRYS ER 20 MEQ PO TBCR: 20.0000 meq | EXTENDED_RELEASE_TABLET | ORAL | 5 refills | Status: DC

## 2020-01-13 NOTE — Progress Notes (Signed)
ReDS Vest / Clip - 01/13/20 1200      ReDS Vest / Clip   Station Marker  D    Ruler Value  33    ReDS Value Range  Moderate volume overload    ReDS Actual Value  39    Anatomical Comments  sitting

## 2020-01-13 NOTE — Progress Notes (Signed)
Advanced Heart Failure Clinic Note   Referring Physician: Dr Excell Seltzer Primary Care: Dr Derrell Lolling Primary [facility] Cardiologist: Dr Beverely Pace   HPI: Alec Taylor is a 74 y.o. male with history of CAD s/p CABG 2004 at [facility], HTN, hyperlipidemia, DMII, obesity s/p gastric bypass and systolic HF.   Echocardiogram 12/2017  revealed LVEF of 45 to 50%.   Admitted 04/28/18 with increased dyspnea. CTA negative for PE. ECHO performed and showed reduced EF 35-40%. He underwent LHC . Stable coronary disease and grafts. Diuresed well. Discharge weight was 177 pounds.   Today he returns for HF follow up. We have not seen him for over a year. Says for the past month has not been feeling well. More fatigued and SOB. Says he was playing golf last month but he says he is SOB now crossing the street. Usually doesn't take lasix but weight was up 5 pounds. Took 2 lasix this week. Has helped some but not back to baseline. + edema, orthopnea, PND. Says he has "some" CP when he tries to hurry and is SOB.   Echo 1/20 EF 40-45%   CMRI 8/14  1. Normal left ventricular size with mild concentric hypertrophy and severely decreased systolic function (LVEF = 38%) with diffuse hypokinesis. There is pericardial late gadolinium enhancement in the basal and mid inferior walls. 2. Normal right ventricular size, thickness and borderline systolic function (LVEF = 45%). There are no regional wall motion abnormalities. 3.  Moderately dilated left atrium, mildly dilated right atrium. 4. Normal size of the aortic root, ascending aorta. Mildly dilated pulmonary artery measuring 31 mm. 5.  Mild aortic, mitral and trivial tricuspid regurgitation. 6.  Normal pericardium.  No pericardial effusion  ECHO 04/29/2018  Left ventricle: The cavity size was normal. Wall thickness was   normal. Systolic function was moderately reduced. The estimated   ejection fraction was in the range of 35% to 40%. Diffuse   hypokinesis. The study is not  technically sufficient to allow   evaluation of LV diastolic function. - Aortic valve: There was mild regurgitation. - Mitral valve: Severely calcified annulus. There was mild   regurgitation. - Left atrium: The atrium was mildly dilated. - Tricuspid valve: There was trivial regurgitation  LHC 04/30/2018   Prox RCA to Mid RCA lesion is 65% stenosed.  Dist RCA-1 lesion is 85% stenosed. Dist RCA-2 lesion is 95% stenosed with 80% stenosed side branch in Post Atrio.  SVG-rPDA graft was visualized by angiography and is normal in caliber and large. The graft exhibits no disease.  Ost RPDA lesion is 45% stenosed. Ost RPDA to prox RPDA lesion is 90% stenosed - This limits flow retrograde to the RPAV-RPL.  Ost LAD lesion is 55% stenosed.  Ost 1st Diag (small caliber vessel) lesion is 70% stenosed.  Prox LAD lesion is 100% stenosed.  LIMA-LAD graft is large caliber, widely patent perfusing a small caliber distal LAD  Prox Cx to Mid Cx stent is 25% stenosed.  Dist Cx lesion is 65% stenosed. Ost 3rd Mrg to 3rd Mrg lesion is 70% stenosed. -Both limbs of the distal bifurcation.  There is severe left ventricular systolic dysfunction. The left ventricular ejection fraction is less than 25% by visual estimate.  LV end diastolic pressure is moderately elevated.  Significant aortic and mitral valve calcification.  Calcified pericardium  Review of systems complete and found to be negative unless listed in HPI.   Past Medical History:  Diagnosis Date  . CAD (coronary artery disease) 09/2007  s/p CABG approximately 2004-[facility] (Cath - 17 Sep 2007:90% prox LAD with diffuse 85% narrowing to distal segment, 80% prox CXA, RCA: prox 90%, prox-mid 70%, mid 85%, distal 80%, D1 80%; Normal EF 65% --status post CABG--LIMA to LAD, SVG to PDA.    [name] COPD (chronic obstructive pulmonary disease) (HCC)   . Depression   . Diabetes mellitus without complication (HCC)   . Hypertension   . Ischemic cardiomyopathy    . Pleural effusion   . S/P CABG x 2    CABG--LIMA to LAD, SVG to PDA.     Current Outpatient Medications  Medication Sig Dispense Refill  . ALPRAZolam (XANAX) 0.25 MG tablet TAKE 1/2 TO 1 TABLET BY MOUTH ONE TIME DAILY AS NEEDED FOR ANXIETY 30 tablet 1  . aspirin EC 81 MG tablet Take 81 mg by mouth daily.    [name] atorvastatin (LIPITOR) 20 MG tablet Take 1 tablet (20 mg total) by mouth daily. 90 tablet 3  . carvedilol (COREG) 6.25 MG tablet Take 0.5 tablets (3.125 mg total) by mouth 2 (two) times daily with a meal. 60 tablet 6  . Cholecalciferol (VITAMIN D3) 5000 units CAPS Take 5,000 Units by mouth 2 (two) times daily.    . DULoxetine (CYMBALTA) 60 MG capsule Take 1 capsule (60 mg total) by mouth daily. 90 capsule 0  . ENTRESTO 49-51 MG TAKE 1 TABLET BY MOUTH TWICE A DAY 60 tablet 11  . finasteride (PROSCAR) 5 MG tablet Take 5 mg by mouth every evening.   6  . folic acid (FOLVITE) 1 MG tablet Take 1 mg by mouth daily.  2  . glimepiride (AMARYL) 2 MG tablet Take 2 mg by mouth every evening.  1  . JARDIANCE 10 MG TABS tablet TAKE 1 TABLET BY MOUTH EVERY DAY 30 tablet 6  . metFORMIN (GLUCOPHAGE-XR) 500 MG 24 hr tablet Take 500 mg by mouth 2 (two) times daily.  1  . methotrexate (RHEUMATREX) 2.5 MG tablet Take 12.5 mg by mouth once a week. On Wednesday  3  . Multiple Vitamins-Minerals (MULTIVITAMIN ADULT PO) Take 1 tablet by mouth every morning.    [name] ofloxacin (OCUFLOX) 0.3 % ophthalmic solution Place 1 drop into both eyes 4 (four) times daily. Prior to procedures.    . pantoprazole (PROTONIX) 40 MG tablet Take 40 mg by mouth 2 (two) times daily.  0  . saccharomyces boulardii ([employer]S DIGESTIVE PROBIOTIC) 250 MG capsule Take 250 mg by mouth every morning.    [name] spironolactone (ALDACTONE) 25 MG tablet TAKE 1/2 TABLET BY MOUTH EVERY DAY 45 tablet 3  . zolpidem (AMBIEN CR) 12.5 MG CR tablet Take 1 tablet (12.5 mg total) by mouth at bedtime as needed for sleep. 90 tablet 1   No current  facility-administered medications for this encounter.   Allergies  Allergen Reactions  . Celecoxib     hyperkalemia  . Losartan     hyperkalemia  . Ramipril     Cough    Social History   Socioeconomic History  . Marital status: Legally Separated    Spouse name: Not on file  . Number of children: Not on file  . Years of education: Not on file  . Highest education level: Not on file  Occupational History  . Not on file  Tobacco Use  . Smoking status: Never Smoker  . Smokeless tobacco: Never Used  Substance and Sexual Activity  . Alcohol use: Yes    Comment: occ  . Drug use: Never  .  Sexual activity: Not on file  Other Topics Concern  . Not on file  Social History Narrative  . Not on file   Social Determinants of Health   Financial Resource Strain:   . Difficulty of Paying Living Expenses: Not on file  Food Insecurity:   . Worried About Programme researcher, broadcasting/film/video in the Last Year: Not on file  . Ran Out of Food in the Last Year: Not on file  Transportation Needs:   . Lack of Transportation (Medical): Not on file  . Lack of Transportation (Non-Medical): Not on file  Physical Activity:   . Days of Exercise per Week: Not on file  . Minutes of Exercise per Session: Not on file  Stress:   . Feeling of Stress : Not on file  Social Connections:   . Frequency of Communication with Friends and Family: Not on file  . Frequency of Social Gatherings with Friends and Family: Not on file  . Attends Religious Services: Not on file  . Active Member of Clubs or Organizations: Not on file  . Attends Banker Meetings: Not on file  . Marital Status: Not on file  Intimate Partner Violence:   . Fear of Current or Ex-Partner: Not on file  . Emotionally Abused: Not on file  . Physically Abused: Not on file  . Sexually Abused: Not on file    Family History  Problem Relation Age of Onset  . Hypertension Father    Vitals:   01/13/20 1152  BP: (!) 123/54  Pulse: 92    SpO2: 97%  Weight: 87.4 kg (192 lb 9.6 oz)     Wt Readings from Last 3 Encounters:  01/13/20 87.4 kg (192 lb 9.6 oz)  07/14/19 83.5 kg (184 lb 2 oz)  12/15/18 84.2 kg (185 lb 9.6 oz)     PHYSICAL EXAM: General:  Well appearing. No resp difficulty HEENT: normal Neck: supple. JVP 5-6 Carotids 2+ bilat; no bruits. No lymphadenopathy or thryomegaly appreciated. Cor: PMI nondisplaced. Regular rate & rhythm. No rubs, gallops or murmurs. Lungs: clear Abdomen: soft, nontender, nondistended. No hepatosplenomegaly. No bruits or masses. Good bowel sounds. Extremities: no cyanosis, clubbing, rash, tr-1+ edema Neuro: alert & orientedx3, cranial nerves grossly intact. moves all 4 extremities w/o difficulty. Affect pleasant  ECG: NSR 90. IVCD. Diffuse non-specific ST-T abnormalities (slightly more prominent than previous) Personally reviewed    ASSESSMENT & PLAN:  1. Chronic Systolic Heart Failure due to ICM -  EF 45-50% in January 2019  - ECHO 04/2018 EF 35-40%. ?Viral versus PVC - CMRI 07/2018 EF 38% pericardial late gadolinium enhancement in the basal and mid inferior walls. Suspicious for recent myocarditis.  - Echo 1/20 EF 40-45% - Symptomatically worse recently -> NYHA III in setting of volume overload REDS 39% - Continue coreg 6.25 mg BID - Continue Entresto 49/51 mg BID.  - Continue Jardiance 10  - Continue spironolactone 12.5 mg daily. K 4.8 07/08/18 - Will have him take lasix 20mg  MWF on scheduled basis + kcl 20 with each dose - See back in 1-2 weeks. If no improvement consider R/L cath  - labs today  2. CAD S/P CABG  - Occasional exertional CP.  - LHC 04/30/2018 severe multivessel disease and patent LIMA-LAD SVG-PDA - Continue statin, BB, and ASA - If symptoms not improving with diuresis -> consider repeat cath   3. Anemia - Iron stores low 05/28/18. Has received feraheme. - Rechech H/H today  4. DMII  - hgb A1C  6.8 in 5/19  - Follows with Dr. Alanson Aly (Endo) -  COntinue Jardiance  5. PVCs - No palpitations currently. Regular on exam    Glori Bickers, MD  12:50 PM

## 2020-01-13 NOTE — Patient Instructions (Addendum)
Labs were done today. We will only call you if there are any Abnormalities. No Call Is A Good Call!!  Medication Changes:  Start : Furosemide ( Lasix ) Take 1 Tablet ( 20 mg ) 3 Times A Week. Monday, Wednesday & Friday  Start: Potassium Take 1 Tablet ( 20 meq ) 3 Times A Week With The Furosemide ( Lasix ). Monday, Wednesday & Friday   Your Provider would like to see you back in the office in 3-4 months.  At the [facility] Clinic, you and your health needs are our priority. As part of our continuing mission to provide you with exceptional heart care, we have created designated Provider Care Teams. These Care Teams include your primary Cardiologist (physician) and Advanced Practice Providers (APPs- Physician Assistants and Nurse Practitioners) who all work together to provide you with the care you need, when you need it.   You may see any of the following providers on your designated Care Team at your next follow up: [name] Dr Arvilla Meres . Dr Marca Ancona . Tonye Becket, NP . Robbie Lis, PA . Karle Plumber, PharmD   Please be sure to bring in all your medications bottles to every appointment.

## 2020-01-15 ENCOUNTER — Ambulatory Visit: Payer: BLUE CROSS/BLUE SHIELD | Admitting: Family Medicine

## 2020-01-17 ENCOUNTER — Encounter (HOSPITAL_COMMUNITY): Payer: Self-pay

## 2020-01-18 ENCOUNTER — Telehealth (HOSPITAL_COMMUNITY): Payer: Self-pay | Admitting: Pharmacy Technician

## 2020-01-18 ENCOUNTER — Other Ambulatory Visit (HOSPITAL_COMMUNITY): Payer: Self-pay

## 2020-01-18 DIAGNOSIS — I2510 Atherosclerotic heart disease of native coronary artery without angina pectoris: Secondary | ICD-10-CM

## 2020-01-18 DIAGNOSIS — I5022 Chronic systolic (congestive) heart failure: Secondary | ICD-10-CM

## 2020-01-18 NOTE — Telephone Encounter (Signed)
Patient's insurance is not paying anything towards his Alec Taylor, so the co-pay card will not work in this case.  Will need to apply for [employer] patient assistance. Called and left patient a message to call me back so that we can begin to work on that.  Archer Asa, CPhT

## 2020-01-19 ENCOUNTER — Telehealth (HOSPITAL_COMMUNITY): Payer: Self-pay | Admitting: Pharmacist

## 2020-01-19 MED ORDER — ENTRESTO 49-51 MG PO TABS: 1.0000 | ORAL_TABLET | Freq: Two times a day (BID) | ORAL | 3 refills | Status: DC

## 2020-01-19 NOTE — Telephone Encounter (Signed)
Spoke with patient and wife, they are aware that their household is over income for assistance through [employer] ($350,000 yearly). Will try tier exception. Since patient does not have Part D, will not be able to apply for PAN grant (would also be over income).   Patient is able to get 90 day supply of Entresto through [facility] for [phone] which is a significant improvement than at the retail pharmacy level. Got [facility]. Advised patient's wife that she could call and place a refill this afternoon to give the pharmacy time to receive the script.   Archer Asa, CPhT

## 2020-01-19 NOTE — Telephone Encounter (Signed)
Entresto 90 day supply sent to [facility] Mail order pharmacy.   Karle Plumber, PharmD, BCPS, BCCP, CPP Heart Failure Clinic Pharmacist [phone]

## 2020-01-21 DIAGNOSIS — Z23 Encounter for immunization: Secondary | ICD-10-CM | POA: Diagnosis not present

## 2020-01-22 ENCOUNTER — Telehealth (HOSPITAL_COMMUNITY): Payer: Self-pay | Admitting: Pharmacist

## 2020-01-22 NOTE — Telephone Encounter (Addendum)
Submitted Tier Exception in an attempt to lower copay cost of Entresto for patient.   Faxed in on 01/22/2120: [phone]  Status is pending, will continue to follow.   Karle Plumber, PharmD, BCPS, BCCP, CPP Heart Failure Clinic Pharmacist [phone]

## 2020-01-24 ENCOUNTER — Encounter (HOSPITAL_COMMUNITY): Payer: Self-pay

## 2020-01-25 ENCOUNTER — Other Ambulatory Visit (HOSPITAL_COMMUNITY): Payer: Self-pay | Admitting: *Deleted

## 2020-01-25 MED ORDER — ENTRESTO 49-51 MG PO TABS: 1.0000 | ORAL_TABLET | Freq: Two times a day (BID) | ORAL | 3 refills | Status: DC

## 2020-01-28 ENCOUNTER — Other Ambulatory Visit: Payer: Self-pay | Admitting: Psychiatry

## 2020-01-28 DIAGNOSIS — F411 Generalized anxiety disorder: Secondary | ICD-10-CM

## 2020-01-28 DIAGNOSIS — F3341 Major depressive disorder, recurrent, in partial remission: Secondary | ICD-10-CM

## 2020-01-28 NOTE — Telephone Encounter (Signed)
Called [employer] to check status of Nutritional therapist. They have received the tier exception and it remains under review. Representative stated that a determination should be made today.  Phone: [phone], extension [phone]  Karle Plumber, PharmD, BCPS, BCCP, CPP Heart Failure Clinic Pharmacist [phone]

## 2020-01-29 ENCOUNTER — Ambulatory Visit (INDEPENDENT_AMBULATORY_CARE_PROVIDER_SITE_OTHER): Payer: Self-pay | Admitting: Family Medicine

## 2020-01-29 ENCOUNTER — Encounter: Payer: Self-pay | Admitting: Family Medicine

## 2020-01-29 ENCOUNTER — Other Ambulatory Visit: Payer: Self-pay | Admitting: Family Medicine

## 2020-01-29 ENCOUNTER — Other Ambulatory Visit: Payer: Self-pay

## 2020-01-29 VITALS — BP 108/68 | HR 101 | Temp 96.0°F | Ht 70.0 in | Wt 185.5 lb

## 2020-01-29 DIAGNOSIS — L309 Dermatitis, unspecified: Secondary | ICD-10-CM

## 2020-01-29 DIAGNOSIS — D509 Iron deficiency anemia, unspecified: Secondary | ICD-10-CM

## 2020-01-29 LAB — URINALYSIS, MICROSCOPIC ONLY

## 2020-01-29 MED ORDER — TRIAMCINOLONE ACETONIDE 0.5 % EX CREA: 1.0000 "application " | TOPICAL_CREAM | Freq: Two times a day (BID) | CUTANEOUS | 0 refills | Status: DC

## 2020-01-29 NOTE — Patient Instructions (Addendum)
We will be in touch regarding your urine results. We will refer you to a hematologist in the area if normal, which I am expecting.   Continue using lotions.   Let us know if you need anything.

## 2020-01-29 NOTE — Progress Notes (Signed)
Chief Complaint  Patient presents with  . Follow-up    Subjective: Patient is a 74 y.o. male here for low iron.  Several yr hx of Fe def anemia. Started after gastric bypass. Has fatigue. Used to get Fe infusions w heme, provider retired. Could not tolerate PO Fe 2/2 GI upset. Tried for 6 mo. Denies bleeding. CCS 3 weeks ago showed polyps, f/u in 1 yr. No bleeding.   Hx of dermatitis. Used to see derm. Would receive steroid cream when his skin broke out that helped. Using lotion most days.   ROS: Endo: +fatigue Skin: As noted in HPI  Past Medical History:  Diagnosis Date  . CAD (coronary artery disease) 09/2007   s/p CABG approximately 2004-[facility] (Cath - 17 Sep 2007:90% prox LAD with diffuse 85% narrowing to distal segment, 80% prox CXA, RCA: prox 90%, prox-mid 70%, mid 85%, distal 80%, D1 80%; Normal EF 65% --status post CABG--LIMA to LAD, SVG to PDA.    [name] COPD (chronic obstructive pulmonary disease) (HCC)   . Depression   . Diabetes mellitus without complication (HCC)   . Hypertension   . Ischemic cardiomyopathy   . Pleural effusion   . S/P CABG x 2    CABG--LIMA to LAD, SVG to PDA.      Objective: BP 108/68 (BP Location: Left Arm, Patient Position: Sitting, Cuff Size: Normal)   Pulse (!) 101   Temp (!) 96 F (35.6 C) (Temporal)   Ht 5\' 10"  (1.778 m)   Wt 185 lb 8 oz (84.1 kg)   SpO2 95%   BMI 26.62 kg/m  General: Awake, appears stated age HEENT: MMM, EOMi Heart: RRR Skin: +scaly skin on ant legs Lungs: CTAB, no rales, wheezes or rhonchi. No accessory muscle use Psych: Age appropriate judgment and insight, normal affect and mood  Assessment and Plan: Iron deficiency anemia, unspecified iron deficiency anemia type - Plan: Urine Microscopic Only  Dermatitis  Orders as above. The patient voiced understanding and agreement to the plan.  [place], DO 01/29/20  12:08 PM

## 2020-02-02 ENCOUNTER — Other Ambulatory Visit: Payer: Self-pay | Admitting: Psychiatry

## 2020-02-02 DIAGNOSIS — F411 Generalized anxiety disorder: Secondary | ICD-10-CM

## 2020-02-02 DIAGNOSIS — F3341 Major depressive disorder, recurrent, in partial remission: Secondary | ICD-10-CM

## 2020-02-04 ENCOUNTER — Encounter: Payer: Self-pay | Admitting: Hematology

## 2020-02-04 ENCOUNTER — Other Ambulatory Visit (HOSPITAL_COMMUNITY): Payer: Self-pay

## 2020-02-04 MED ORDER — JARDIANCE 10 MG PO TABS: 10.0000 mg | ORAL_TABLET | Freq: Every day | ORAL | 3 refills | Status: DC

## 2020-02-08 NOTE — Telephone Encounter (Signed)
Tier Exception for [name] was denied. Will continue to fill Entresto at [facility] [employer] [facility], as this provides him with the lowest copay. Patient aware.   Karle Plumber, PharmD, BCPS, BCCP, CPP Heart Failure Clinic Pharmacist [phone]

## 2020-02-09 DIAGNOSIS — E113593 Type 2 diabetes mellitus with proliferative diabetic retinopathy without macular edema, bilateral: Secondary | ICD-10-CM | POA: Diagnosis not present

## 2020-02-10 ENCOUNTER — Telehealth: Payer: Self-pay | Admitting: Psychiatry

## 2020-02-10 DIAGNOSIS — F411 Generalized anxiety disorder: Secondary | ICD-10-CM

## 2020-02-10 MED ORDER — BUSPIRONE HCL 15 MG PO TABS: ORAL_TABLET | ORAL | 0 refills | Status: DC

## 2020-02-10 NOTE — Telephone Encounter (Signed)
Refill request from pharmacy for Buspar.

## 2020-02-12 ENCOUNTER — Other Ambulatory Visit: Payer: Self-pay

## 2020-02-12 ENCOUNTER — Other Ambulatory Visit (HOSPITAL_COMMUNITY): Payer: Self-pay

## 2020-02-12 ENCOUNTER — Ambulatory Visit (HOSPITAL_COMMUNITY)
Admission: RE | Admit: 2020-02-12 | Discharge: 2020-02-12 | Disposition: A | Discharge status: Home / Self Care | Payer: BC Managed Care – PPO | Source: Ambulatory Visit | Attending: Internal Medicine | Admitting: Internal Medicine

## 2020-02-12 ENCOUNTER — Encounter (HOSPITAL_COMMUNITY): Payer: Self-pay | Admitting: Internal Medicine

## 2020-02-12 ENCOUNTER — Ambulatory Visit (HOSPITAL_BASED_OUTPATIENT_CLINIC_OR_DEPARTMENT_OTHER)
Admission: RE | Admit: 2020-02-12 | Discharge: 2020-02-12 | Disposition: A | Discharge status: Home / Self Care | Payer: BC Managed Care – PPO | Source: Ambulatory Visit | Attending: Internal Medicine | Admitting: Internal Medicine

## 2020-02-12 VITALS — BP 116/50 | HR 86 | Wt 185.4 lb

## 2020-02-12 DIAGNOSIS — E119 Type 2 diabetes mellitus without complications: Secondary | ICD-10-CM | POA: Diagnosis not present

## 2020-02-12 DIAGNOSIS — I2510 Atherosclerotic heart disease of native coronary artery without angina pectoris: Secondary | ICD-10-CM

## 2020-02-12 DIAGNOSIS — Z9884 Bariatric surgery status: Secondary | ICD-10-CM | POA: Insufficient documentation

## 2020-02-12 DIAGNOSIS — Z7982 Long term (current) use of aspirin: Secondary | ICD-10-CM | POA: Diagnosis not present

## 2020-02-12 DIAGNOSIS — I110 Hypertensive heart disease with heart failure: Secondary | ICD-10-CM | POA: Diagnosis not present

## 2020-02-12 DIAGNOSIS — Z7984 Long term (current) use of oral hypoglycemic drugs: Secondary | ICD-10-CM | POA: Diagnosis not present

## 2020-02-12 DIAGNOSIS — Z79899 Other long term (current) drug therapy: Secondary | ICD-10-CM | POA: Diagnosis not present

## 2020-02-12 DIAGNOSIS — I5022 Chronic systolic (congestive) heart failure: Secondary | ICD-10-CM | POA: Diagnosis not present

## 2020-02-12 DIAGNOSIS — D649 Anemia, unspecified: Secondary | ICD-10-CM | POA: Diagnosis not present

## 2020-02-12 DIAGNOSIS — E669 Obesity, unspecified: Secondary | ICD-10-CM | POA: Insufficient documentation

## 2020-02-12 DIAGNOSIS — Z8249 Family history of ischemic heart disease and other diseases of the circulatory system: Secondary | ICD-10-CM | POA: Insufficient documentation

## 2020-02-12 DIAGNOSIS — J449 Chronic obstructive pulmonary disease, unspecified: Secondary | ICD-10-CM | POA: Diagnosis not present

## 2020-02-12 DIAGNOSIS — E785 Hyperlipidemia, unspecified: Secondary | ICD-10-CM | POA: Insufficient documentation

## 2020-02-12 DIAGNOSIS — I351 Nonrheumatic aortic (valve) insufficiency: Secondary | ICD-10-CM | POA: Insufficient documentation

## 2020-02-12 DIAGNOSIS — F329 Major depressive disorder, single episode, unspecified: Secondary | ICD-10-CM | POA: Insufficient documentation

## 2020-02-12 DIAGNOSIS — Z951 Presence of aortocoronary bypass graft: Secondary | ICD-10-CM | POA: Diagnosis not present

## 2020-02-12 DIAGNOSIS — I255 Ischemic cardiomyopathy: Secondary | ICD-10-CM | POA: Diagnosis not present

## 2020-02-12 DIAGNOSIS — Z8774 Personal history of (corrected) congenital malformations of heart and circulatory system: Secondary | ICD-10-CM | POA: Diagnosis not present

## 2020-02-12 LAB — CBC
HCT: 32.8 % — ABNORMAL LOW (ref 39.0–52.0)
Hemoglobin: 9.3 g/dL — ABNORMAL LOW (ref 13.0–17.0)
MCH: 22.2 pg — ABNORMAL LOW (ref 26.0–34.0)
MCHC: 28.4 g/dL — ABNORMAL LOW (ref 30.0–36.0)
MCV: 78.3 fL — ABNORMAL LOW (ref 80.0–100.0)
Platelets: 255 10*3/uL (ref 150–400)
RBC: 4.19 MIL/uL — ABNORMAL LOW (ref 4.22–5.81)
RDW: 15.7 % — ABNORMAL HIGH (ref 11.5–15.5)
WBC: 6.8 10*3/uL (ref 4.0–10.5)
nRBC: 0 % (ref 0.0–0.2)

## 2020-02-12 LAB — BASIC METABOLIC PANEL
Anion gap: 10 (ref 5–15)
BUN: 37 mg/dL — ABNORMAL HIGH (ref 8–23)
CO2: 25 mmol/L (ref 22–32)
Calcium: 9.1 mg/dL (ref 8.9–10.3)
Chloride: 103 mmol/L (ref 98–111)
Creatinine, Ser: 0.92 mg/dL (ref 0.61–1.24)
GFR calc Af Amer: >60 mL/min (ref >60)
GFR calc non Af Amer: >60 mL/min (ref >60)
Glucose, Bld: 150 mg/dL — ABNORMAL HIGH (ref 70–99)
Potassium: 5.9 mmol/L — ABNORMAL HIGH (ref 3.5–5.1)
Sodium: 138 mmol/L (ref 135–145)

## 2020-02-12 LAB — TSH: TSH: 2.63 u[IU]/mL (ref 0.350–4.500)

## 2020-02-12 LAB — PROTIME-INR
INR: 1 (ref 0.8–1.2)
Prothrombin Time: 13.6 seconds (ref 11.4–15.2)

## 2020-02-12 MED ORDER — SODIUM CHLORIDE 0.9% FLUSH: 3.0000 mL | Freq: Two times a day (BID) | INTRAVENOUS | Status: DC

## 2020-02-12 NOTE — Progress Notes (Signed)
ReDS Vest / Clip - 02/12/20 1500      ReDS Vest / Clip   Station Marker  D    Ruler Value  33    ReDS Value Range  Low volume    ReDS Actual Value  35    Anatomical Comments  sitting

## 2020-02-12 NOTE — H&P (View-Only) (Signed)
Advanced Heart Failure Clinic Note   Referring Physician: Dr Excell Seltzer Primary Care: Dr Derrell Lolling Primary [facility] Cardiologist: Dr Beverely Pace   HPI: Alec Taylor is a 74 y.o. male with history of CAD s/p CABG 2004 at [facility], HTN, hyperlipidemia, DMII, obesity s/p gastric bypass and systolic HF.   Echocardiogram 12/2017  revealed LVEF of 45 to 50%.   Admitted 04/28/18 with increased dyspnea. CTA negative for PE. ECHO performed and showed reduced EF 35-40%. He underwent LHC . Stable coronary disease and grafts. Diuresed well. Discharge weight was 177 pounds.   I saw him about 1 month ago and was feeling bad with significant volume overload. We added lasix 20mg  MWF + KCl. Weight down 13 pounds 198 -> 185. Reds 39% -> 35%. Breathing some better. But still feels puny. SOB with mild activity and ADLs. No orthopnea or PND or edema.   Echo today. LV dilated EF 40-45% RV ok. Personally reviewed   Echo 1/20 EF 40-45%   CMRI 8/14  1. Normal left ventricular size with mild concentric hypertrophy and severely decreased systolic function (LVEF = 38%) with diffuse hypokinesis. There is pericardial late gadolinium enhancement in the basal and mid inferior walls. 2. Normal right ventricular size, thickness and borderline systolic function (LVEF = 45%). There are no regional wall motion abnormalities. 3.  Moderately dilated left atrium, mildly dilated right atrium. 4. Normal size of the aortic root, ascending aorta. Mildly dilated pulmonary artery measuring 31 mm. 5.  Mild aortic, mitral and trivial tricuspid regurgitation. 6.  Normal pericardium.  No pericardial effusion  ECHO 04/29/2018  Left ventricle: The cavity size was normal. Wall thickness was   normal. Systolic function was moderately reduced. The estimated   ejection fraction was in the range of 35% to 40%. Diffuse   hypokinesis. The study is not technically sufficient to allow   evaluation of LV diastolic function. - Aortic valve: There  was mild regurgitation. - Mitral valve: Severely calcified annulus. There was mild   regurgitation. - Left atrium: The atrium was mildly dilated. - Tricuspid valve: There was trivial regurgitation  LHC 04/30/2018   Prox RCA to Mid RCA lesion is 65% stenosed.  Dist RCA-1 lesion is 85% stenosed. Dist RCA-2 lesion is 95% stenosed with 80% stenosed side branch in Post Atrio.  SVG-rPDA graft was visualized by angiography and is normal in caliber and large. The graft exhibits no disease.  Ost RPDA lesion is 45% stenosed. Ost RPDA to prox RPDA lesion is 90% stenosed - This limits flow retrograde to the RPAV-RPL.  Ost LAD lesion is 55% stenosed.  Ost 1st Diag (small caliber vessel) lesion is 70% stenosed.  Prox LAD lesion is 100% stenosed.  LIMA-LAD graft is large caliber, widely patent perfusing a small caliber distal LAD  Prox Cx to Mid Cx stent is 25% stenosed.  Dist Cx lesion is 65% stenosed. Ost 3rd Mrg to 3rd Mrg lesion is 70% stenosed. -Both limbs of the distal bifurcation.  There is severe left ventricular systolic dysfunction. The left ventricular ejection fraction is less than 25% by visual estimate.  LV end diastolic pressure is moderately elevated.  Significant aortic and mitral valve calcification.  Calcified pericardium  Review of systems complete and found to be negative unless listed in HPI.   Past Medical History:  Diagnosis Date  . CAD (coronary artery disease) 09/2007   s/p CABG approximately 2004-[facility] (Cath - 17 Sep 2007:90% prox LAD with diffuse 85% narrowing to distal segment, 80% prox CXA, RCA: prox  90%, prox-mid 70%, mid 85%, distal 80%, D1 80%; Normal EF 65% --status post CABG--LIMA to LAD, SVG to PDA.    [name] COPD (chronic obstructive pulmonary disease) (HCC)   . Depression   . Diabetes mellitus without complication (HCC)   . Hypertension   . Ischemic cardiomyopathy   . Pleural effusion   . S/P CABG x 2    CABG--LIMA to LAD, SVG to PDA.     Current  Outpatient Medications  Medication Sig Dispense Refill  . furosemide (LASIX) 20 MG tablet Take 1 tablet (20 mg total) by mouth 3 (three) times a week. Take 1 Tablet ( 20 mg ) 3 Times A Week  on Monday, Wednesday & Friday 12 tablet 11  . ALPRAZolam (XANAX) 0.25 MG tablet TAKE 1/2 TO 1 TABLET BY MOUTH ONE TIME DAILY AS NEEDED FOR ANXIETY 30 tablet 1  . aspirin EC 81 MG tablet Take 81 mg by mouth daily.    [name] atorvastatin (LIPITOR) 20 MG tablet Take 1 tablet (20 mg total) by mouth daily. 90 tablet 3  . buPROPion (WELLBUTRIN XL) 150 MG 24 hr tablet Take 150 mg by mouth every morning.    . busPIRone (BUSPAR) 15 MG tablet Take 1/3 tablet p.o. twice daily for 1 week, then take 2/3 tablet p.o. twice daily for 1 week, then take 1 tablet p.o. twice daily 180 tablet 0  . carvedilol (COREG) 6.25 MG tablet Take 0.5 tablets (3.125 mg total) by mouth 2 (two) times daily with a meal. 60 tablet 6  . Cholecalciferol (VITAMIN D3) 5000 units CAPS Take 5,000 Units by mouth 2 (two) times daily.    . DULoxetine (CYMBALTA) 60 MG capsule TAKE ONE CAPSULE BY MOUTH ONE TIME DAILY 90 capsule 0  . empagliflozin (JARDIANCE) 10 MG TABS tablet Take 10 mg by mouth daily. 90 tablet 3  . finasteride (PROSCAR) 5 MG tablet Take 5 mg by mouth every evening.   6  . folic acid (FOLVITE) 1 MG tablet Take 1 mg by mouth daily.  2  . glimepiride (AMARYL) 2 MG tablet Take 2 mg by mouth every evening.  1  . metFORMIN (GLUCOPHAGE-XR) 500 MG 24 hr tablet Take 500 mg by mouth 2 (two) times daily.  1  . methotrexate (RHEUMATREX) 2.5 MG tablet Take 12.5 mg by mouth once a week. On Wednesday  3  . Multiple Vitamins-Minerals (MULTIVITAMIN ADULT PO) Take 1 tablet by mouth every morning.    [name] ofloxacin (OCUFLOX) 0.3 % ophthalmic solution Place 1 drop into both eyes 4 (four) times daily. Prior to procedures.    . pantoprazole (PROTONIX) 40 MG tablet Take 40 mg by mouth 2 (two) times daily.  0  . potassium chloride SA (KLOR-CON) 20 MEQ tablet Take 1  tablet (20 mEq total) by mouth 3 (three) times a week. Take 1 tablet ( 20 meq ) by mouth with Furosemide ( Lasix ) 3 Times A Week. 12 tablet 5  . saccharomyces boulardii ([employer] DIGESTIVE PROBIOTIC) 250 MG capsule Take 250 mg by mouth every morning.    . sacubitril-valsartan (ENTRESTO) 49-51 MG Take 1 tablet by mouth 2 (two) times daily. 180 tablet 3  . spironolactone (ALDACTONE) 25 MG tablet TAKE 1/2 TABLET BY MOUTH EVERY DAY 45 tablet 3  . triamcinolone cream (KENALOG) 0.5 % Apply 1 application topically 2 (two) times daily. 30 g 0  . zolpidem (AMBIEN CR) 12.5 MG CR tablet Take 1 tablet (12.5 mg total) by mouth at bedtime as needed for  sleep. 90 tablet 1   No current facility-administered medications for this encounter.   Allergies  Allergen Reactions  . Celecoxib     hyperkalemia  . Losartan     hyperkalemia  . Ramipril     Cough    Social History   Socioeconomic History  . Marital status: Legally Separated    Spouse name: Not on file  . Number of children: Not on file  . Years of education: Not on file  . Highest education level: Not on file  Occupational History  . Not on file  Tobacco Use  . Smoking status: Never Smoker  . Smokeless tobacco: Never Used  Substance and Sexual Activity  . Alcohol use: Yes    Comment: occ  . Drug use: Never  . Sexual activity: Not on file  Other Topics Concern  . Not on file  Social History Narrative  . Not on file   Social Determinants of Health   Financial Resource Strain:   . Difficulty of Paying Living Expenses: Not on file  Food Insecurity:   . Worried About Programme researcher, broadcasting/film/video in the Last Year: Not on file  . Ran Out of Food in the Last Year: Not on file  Transportation Needs:   . Lack of Transportation (Medical): Not on file  . Lack of Transportation (Non-Medical): Not on file  Physical Activity:   . Days of Exercise per Week: Not on file  . Minutes of Exercise per Session: Not on file  Stress:   . Feeling of Stress : Not  on file  Social Connections:   . Frequency of Communication with Friends and Family: Not on file  . Frequency of Social Gatherings with Friends and Family: Not on file  . Attends Religious Services: Not on file  . Active Member of Clubs or Organizations: Not on file  . Attends Banker Meetings: Not on file  . Marital Status: Not on file  Intimate Partner Violence:   . Fear of Current or Ex-Partner: Not on file  . Emotionally Abused: Not on file  . Physically Abused: Not on file  . Sexually Abused: Not on file    Family History  Problem Relation Age of Onset  . Hypertension Father    Vitals:   02/12/20 1456  BP: (!) 116/50  Pulse: 86  SpO2: 99%  Weight: 84.1 kg (185 lb 6.4 oz)     Wt Readings from Last 3 Encounters:  02/12/20 84.1 kg (185 lb 6.4 oz)  01/29/20 84.1 kg (185 lb 8 oz)  01/13/20 87.4 kg (192 lb 9.6 oz)     PHYSICAL EXAM: General:  Well appearing. No resp difficulty HEENT: normal Neck: supple. no JVD. Carotids 2+ bilat; no bruits. No lymphadenopathy or thryomegaly appreciated. Cor: PMI nondisplaced. Regular rate & rhythm. No rubs, gallops or murmurs. Lungs: clear Abdomen: soft, nontender, nondistended. No hepatosplenomegaly. No bruits or masses. Good bowel sounds. Extremities: no cyanosis, clubbing, rash, edema Neuro: alert & orientedx3, cranial nerves grossly intact. moves all 4 extremities w/o difficulty. Affect pleasant   ECG: NSR 83 ICD inferolateral TWI  Personally reviewed    ASSESSMENT & PLAN:  1. Chronic Systolic Heart Failure due to ICM - EF 45-50% in January 2019  - ECHO 04/2018 EF 35-40%. ?Viral versus PVC - CMRI 07/2018 EF 38% pericardial late gadolinium enhancement in the basal and mid inferior walls. Suspicious for recent myocarditis.  - Echo 1/20 EF 40-45% - Echo today EF 40-45% - Symptomatically worse  recently NYHA III-IIIb not much improvement with diuresis  - Continue coreg 6.25 mg BID - Continue Entresto 49/51 mg BID.  -  Continue Jardiance 10  - Continue spironolactone 12.5 mg daily. K 4.8 07/08/18 - Continue lasix 20mg  MWF + kcl 20 with each dose - Symptomatically worse with no improvement with diuresis. Symptoms reminiscent to his previous angina per his report. Will plan R/L cath - Check labs today including TSH  2. CAD S/P CABG  - LHC 04/30/2018 severe multivessel disease and patent LIMA-LAD SVG-PDA - Continue statin, BB, and ASA - As above plan repeat cath  3. Anemia - Iron stores low 05/28/18. Has received feraheme  4. DMII  - Follows with Dr. Alanson Aly (Endo) - Continue Jardiance  5. PVCs - No palpitations currently. Regular on exam    Glori Bickers, MD  3:21 PM

## 2020-02-12 NOTE — Progress Notes (Signed)
  Echocardiogram 2D Echocardiogram has been performed.  Leta Jungling M 02/12/2020, 2:20 PM

## 2020-02-12 NOTE — Patient Instructions (Addendum)
No medication changes today  Labs today We will only contact you if something comes back abnormal or we need to make some changes. Otherwise no news is good news!  Your physician recommends that you schedule a follow-up appointment in: 3 months with Dr [name]  Please call office at [phone] option [phone] if you have any questions or concerns.    [facility] [id number] [place] [address] Dept: [phone] Loc: [phone]  Alec Taylor  02/12/2020  You are scheduled for a Cardiac Catheterization on Thursday, March 11 with Dr. Arvilla Meres.  1. Please arrive at the [facility] (Main Entrance A) at [facility]: [address] [place], [place] [address] at 7:00 AM (This time is two hours before your procedure to ensure your preparation). Free valet parking service is available.   Special note: Every effort is made to have your procedure done on time. Please understand that emergencies sometimes delay scheduled procedures.  2. Diet: Do not eat or drink after midnight.   3. Labs: done today in office  COVID SCREEN:  You will need a pre procedure COVID test     WHEN:  Monday March 8th, 2021 at 12:35 WHERE: [facility]                 [address] [place] [address]  This is a drive thru testing site, you will remain in your car. Be sure to get in the line FOR PROCEDURES Once you have been swabbed you will need to remain home in quarantine until you return for your procedure. 4. Medication instructions in preparation for your procedure:   Contrast Allergy: No   *For reference purposes while preparing patient instructions.   Delete this med list prior to printing instructions for patient.*    HOLD ALL oral medications on the morning of your procedure   Do not take Diabetes Med Glucophage (Metformin) on the day of the procedure and HOLD 48 HOURS  AFTER THE PROCEDURE.   On the morning of your procedure, take your Aspirin and any morning medicines NOT listed above.  You may use sips of water.  5. Plan for one night stay--bring personal belongings. 6. Bring a current list of your medications and current insurance cards. 7. You MUST have a responsible person to drive you home. 8. Someone MUST be with you the first 24 hours after you arrive home or your discharge will be delayed. 9. Please wear clothes that are easy to get on and off and wear slip-on shoes.  Thank you for allowing [place] to care for you!   -- [facility]

## 2020-02-12 NOTE — Progress Notes (Signed)
Advanced Heart Failure Clinic Note   Referring Physician: Dr Excell Seltzer Primary Care: Dr Derrell Lolling Primary [facility] Cardiologist: Dr Beverely Pace   HPI: Alec Taylor is a 74 y.o. male with history of CAD s/p CABG 2004 at [facility], HTN, hyperlipidemia, DMII, obesity s/p gastric bypass and systolic HF.   Echocardiogram 12/2017  revealed LVEF of 45 to 50%.   Admitted 04/28/18 with increased dyspnea. CTA negative for PE. ECHO performed and showed reduced EF 35-40%. He underwent LHC . Stable coronary disease and grafts. Diuresed well. Discharge weight was 177 pounds.   I saw him about 1 month ago and was feeling bad with significant volume overload. We added lasix 20mg  MWF + KCl. Weight down 13 pounds 198 -> 185. Reds 39% -> 35%. Breathing some better. But still feels puny. SOB with mild activity and ADLs. No orthopnea or PND or edema.   Echo today. LV dilated EF 40-45% RV ok. Personally reviewed   Echo 1/20 EF 40-45%   CMRI 8/14  1. Normal left ventricular size with mild concentric hypertrophy and severely decreased systolic function (LVEF = 38%) with diffuse hypokinesis. There is pericardial late gadolinium enhancement in the basal and mid inferior walls. 2. Normal right ventricular size, thickness and borderline systolic function (LVEF = 45%). There are no regional wall motion abnormalities. 3.  Moderately dilated left atrium, mildly dilated right atrium. 4. Normal size of the aortic root, ascending aorta. Mildly dilated pulmonary artery measuring 31 mm. 5.  Mild aortic, mitral and trivial tricuspid regurgitation. 6.  Normal pericardium.  No pericardial effusion  ECHO 04/29/2018  Left ventricle: The cavity size was normal. Wall thickness was   normal. Systolic function was moderately reduced. The estimated   ejection fraction was in the range of 35% to 40%. Diffuse   hypokinesis. The study is not technically sufficient to allow   evaluation of LV diastolic function. - Aortic valve: There  was mild regurgitation. - Mitral valve: Severely calcified annulus. There was mild   regurgitation. - Left atrium: The atrium was mildly dilated. - Tricuspid valve: There was trivial regurgitation  LHC 04/30/2018   Prox RCA to Mid RCA lesion is 65% stenosed.  Dist RCA-1 lesion is 85% stenosed. Dist RCA-2 lesion is 95% stenosed with 80% stenosed side branch in Post Atrio.  SVG-rPDA graft was visualized by angiography and is normal in caliber and large. The graft exhibits no disease.  Ost RPDA lesion is 45% stenosed. Ost RPDA to prox RPDA lesion is 90% stenosed - This limits flow retrograde to the RPAV-RPL.  Ost LAD lesion is 55% stenosed.  Ost 1st Diag (small caliber vessel) lesion is 70% stenosed.  Prox LAD lesion is 100% stenosed.  LIMA-LAD graft is large caliber, widely patent perfusing a small caliber distal LAD  Prox Cx to Mid Cx stent is 25% stenosed.  Dist Cx lesion is 65% stenosed. Ost 3rd Mrg to 3rd Mrg lesion is 70% stenosed. -Both limbs of the distal bifurcation.  There is severe left ventricular systolic dysfunction. The left ventricular ejection fraction is less than 25% by visual estimate.  LV end diastolic pressure is moderately elevated.  Significant aortic and mitral valve calcification.  Calcified pericardium  Review of systems complete and found to be negative unless listed in HPI.   Past Medical History:  Diagnosis Date  . CAD (coronary artery disease) 09/2007   s/p CABG approximately 2004-[facility] (Cath - 17 Sep 2007:90% prox LAD with diffuse 85% narrowing to distal segment, 80% prox CXA, RCA: prox  90%, prox-mid 70%, mid 85%, distal 80%, D1 80%; Normal EF 65% --status post CABG--LIMA to LAD, SVG to PDA.    . COPD (chronic obstructive pulmonary disease) (HCC)   . Depression   . Diabetes mellitus without complication (HCC)   . Hypertension   . Ischemic cardiomyopathy   . Pleural effusion   . S/P CABG x 2    CABG--LIMA to LAD, SVG to PDA.     Current  Outpatient Medications  Medication Sig Dispense Refill  . furosemide (LASIX) 20 MG tablet Take 1 tablet (20 mg total) by mouth 3 (three) times a week. Take 1 Tablet ( 20 mg ) 3 Times A Week  on Monday, Wednesday & Friday 12 tablet 11  . ALPRAZolam (XANAX) 0.25 MG tablet TAKE 1/2 TO 1 TABLET BY MOUTH ONE TIME DAILY AS NEEDED FOR ANXIETY 30 tablet 1  . aspirin EC 81 MG tablet Take 81 mg by mouth daily.    . atorvastatin (LIPITOR) 20 MG tablet Take 1 tablet (20 mg total) by mouth daily. 90 tablet 3  . buPROPion (WELLBUTRIN XL) 150 MG 24 hr tablet Take 150 mg by mouth every morning.    . busPIRone (BUSPAR) 15 MG tablet Take 1/3 tablet p.o. twice daily for 1 week, then take 2/3 tablet p.o. twice daily for 1 week, then take 1 tablet p.o. twice daily 180 tablet 0  . carvedilol (COREG) 6.25 MG tablet Take 0.5 tablets (3.125 mg total) by mouth 2 (two) times daily with a meal. 60 tablet 6  . Cholecalciferol (VITAMIN D3) 5000 units CAPS Take 5,000 Units by mouth 2 (two) times daily.    . DULoxetine (CYMBALTA) 60 MG capsule TAKE ONE CAPSULE BY MOUTH ONE TIME DAILY 90 capsule 0  . empagliflozin (JARDIANCE) 10 MG TABS tablet Take 10 mg by mouth daily. 90 tablet 3  . finasteride (PROSCAR) 5 MG tablet Take 5 mg by mouth every evening.   6  . folic acid (FOLVITE) 1 MG tablet Take 1 mg by mouth daily.  2  . glimepiride (AMARYL) 2 MG tablet Take 2 mg by mouth every evening.  1  . metFORMIN (GLUCOPHAGE-XR) 500 MG 24 hr tablet Take 500 mg by mouth 2 (two) times daily.  1  . methotrexate (RHEUMATREX) 2.5 MG tablet Take 12.5 mg by mouth once a week. On Wednesday  3  . Multiple Vitamins-Minerals (MULTIVITAMIN ADULT PO) Take 1 tablet by mouth every morning.    . ofloxacin (OCUFLOX) 0.3 % ophthalmic solution Place 1 drop into both eyes 4 (four) times daily. Prior to procedures.    . pantoprazole (PROTONIX) 40 MG tablet Take 40 mg by mouth 2 (two) times daily.  0  . potassium chloride SA (KLOR-CON) 20 MEQ tablet Take 1  tablet (20 mEq total) by mouth 3 (three) times a week. Take 1 tablet ( 20 meq ) by mouth with Furosemide ( Lasix ) 3 Times A Week. 12 tablet 5  . saccharomyces boulardii ([employer] DIGESTIVE PROBIOTIC) 250 MG capsule Take 250 mg by mouth every morning.    . sacubitril-valsartan (ENTRESTO) 49-51 MG Take 1 tablet by mouth 2 (two) times daily. 180 tablet 3  . spironolactone (ALDACTONE) 25 MG tablet TAKE 1/2 TABLET BY MOUTH EVERY DAY 45 tablet 3  . triamcinolone cream (KENALOG) 0.5 % Apply 1 application topically 2 (two) times daily. 30 g 0  . zolpidem (AMBIEN CR) 12.5 MG CR tablet Take 1 tablet (12.5 mg total) by mouth at bedtime as needed for   sleep. 90 tablet 1   No current facility-administered medications for this encounter.   Allergies  Allergen Reactions  . Celecoxib     hyperkalemia  . Losartan     hyperkalemia  . Ramipril     Cough    Social History   Socioeconomic History  . Marital status: Legally Separated    Spouse name: Not on file  . Number of children: Not on file  . Years of education: Not on file  . Highest education level: Not on file  Occupational History  . Not on file  Tobacco Use  . Smoking status: Never Smoker  . Smokeless tobacco: Never Used  Substance and Sexual Activity  . Alcohol use: Yes    Comment: occ  . Drug use: Never  . Sexual activity: Not on file  Other Topics Concern  . Not on file  Social History Narrative  . Not on file   Social Determinants of Health   Financial Resource Strain:   . Difficulty of Paying Living Expenses: Not on file  Food Insecurity:   . Worried About Programme researcher, broadcasting/film/video in the Last Year: Not on file  . Ran Out of Food in the Last Year: Not on file  Transportation Needs:   . Lack of Transportation (Medical): Not on file  . Lack of Transportation (Non-Medical): Not on file  Physical Activity:   . Days of Exercise per Week: Not on file  . Minutes of Exercise per Session: Not on file  Stress:   . Feeling of Stress : Not  on file  Social Connections:   . Frequency of Communication with Friends and Family: Not on file  . Frequency of Social Gatherings with Friends and Family: Not on file  . Attends Religious Services: Not on file  . Active Member of Clubs or Organizations: Not on file  . Attends Banker Meetings: Not on file  . Marital Status: Not on file  Intimate Partner Violence:   . Fear of Current or Ex-Partner: Not on file  . Emotionally Abused: Not on file  . Physically Abused: Not on file  . Sexually Abused: Not on file    Family History  Problem Relation Age of Onset  . Hypertension Father    Vitals:   02/12/20 1456  BP: (!) 116/50  Pulse: 86  SpO2: 99%  Weight: 84.1 kg (185 lb 6.4 oz)     Wt Readings from Last 3 Encounters:  02/12/20 84.1 kg (185 lb 6.4 oz)  01/29/20 84.1 kg (185 lb 8 oz)  01/13/20 87.4 kg (192 lb 9.6 oz)     PHYSICAL EXAM: General:  Well appearing. No resp difficulty HEENT: normal Neck: supple. no JVD. Carotids 2+ bilat; no bruits. No lymphadenopathy or thryomegaly appreciated. Cor: PMI nondisplaced. Regular rate & rhythm. No rubs, gallops or murmurs. Lungs: clear Abdomen: soft, nontender, nondistended. No hepatosplenomegaly. No bruits or masses. Good bowel sounds. Extremities: no cyanosis, clubbing, rash, edema Neuro: alert & orientedx3, cranial nerves grossly intact. moves all 4 extremities w/o difficulty. Affect pleasant   ECG: NSR 83 ICD inferolateral TWI  Personally reviewed    ASSESSMENT & PLAN:  1. Chronic Systolic Heart Failure due to ICM - EF 45-50% in January 2019  - ECHO 04/2018 EF 35-40%. ?Viral versus PVC - [facility]RI 07/2018 EF 38% pericardial late gadolinium enhancement in the basal and mid inferior walls. Suspicious for recent myocarditis.  - Echo 1/20 EF 40-45% - Echo today EF 40-45% - Symptomatically worse  recently NYHA III-IIIb not much improvement with diuresis  - Continue coreg 6.25 mg BID - Continue Entresto 49/51 mg BID.  -  Continue Jardiance 10  - Continue spironolactone 12.5 mg daily. K 4.8 07/08/18 - Continue lasix 20mg  MWF + kcl 20 with each dose - Symptomatically worse with no improvement with diuresis. Symptoms reminiscent to his previous angina per his report. Will plan R/L cath - Check labs today including TSH  2. CAD S/P CABG  - LHC 04/30/2018 severe multivessel disease and patent LIMA-LAD SVG-PDA - Continue statin, BB, and ASA - As above plan repeat cath  3. Anemia - Iron stores low 05/28/18. Has received feraheme  4. DMII  - Follows with Dr. Alanson Aly (Endo) - Continue Jardiance  5. PVCs - No palpitations currently. Regular on exam    Glori Bickers, MD  3:21 PM

## 2020-02-15 ENCOUNTER — Telehealth: Payer: Self-pay | Admitting: *Deleted

## 2020-02-15 ENCOUNTER — Other Ambulatory Visit (HOSPITAL_COMMUNITY)
Admission: RE | Admit: 2020-02-15 | Discharge: 2020-02-15 | Disposition: A | Discharge status: Home / Self Care | Payer: BC Managed Care – PPO | Source: Ambulatory Visit | Attending: Internal Medicine | Admitting: Internal Medicine

## 2020-02-15 ENCOUNTER — Other Ambulatory Visit: Payer: Self-pay

## 2020-02-15 ENCOUNTER — Inpatient Hospital Stay: Payer: BC Managed Care – PPO | Attending: Hematology & Oncology

## 2020-02-15 ENCOUNTER — Telehealth (HOSPITAL_COMMUNITY): Payer: Self-pay | Admitting: Cardiology

## 2020-02-15 ENCOUNTER — Encounter: Payer: Self-pay | Admitting: Hematology

## 2020-02-15 ENCOUNTER — Other Ambulatory Visit: Payer: Self-pay | Admitting: Hematology

## 2020-02-15 ENCOUNTER — Inpatient Hospital Stay (HOSPITAL_BASED_OUTPATIENT_CLINIC_OR_DEPARTMENT_OTHER): Payer: BC Managed Care – PPO | Admitting: Hematology

## 2020-02-15 VITALS — BP 129/56 | HR 90 | Temp 96.8°F | Resp 18 | Ht 70.0 in | Wt 186.8 lb

## 2020-02-15 DIAGNOSIS — F329 Major depressive disorder, single episode, unspecified: Secondary | ICD-10-CM | POA: Diagnosis not present

## 2020-02-15 DIAGNOSIS — D509 Iron deficiency anemia, unspecified: Secondary | ICD-10-CM

## 2020-02-15 DIAGNOSIS — E875 Hyperkalemia: Secondary | ICD-10-CM | POA: Diagnosis not present

## 2020-02-15 DIAGNOSIS — I2510 Atherosclerotic heart disease of native coronary artery without angina pectoris: Secondary | ICD-10-CM | POA: Insufficient documentation

## 2020-02-15 DIAGNOSIS — Z01812 Encounter for preprocedural laboratory examination: Secondary | ICD-10-CM | POA: Insufficient documentation

## 2020-02-15 DIAGNOSIS — Z20822 Contact with and (suspected) exposure to covid-19: Secondary | ICD-10-CM | POA: Insufficient documentation

## 2020-02-15 DIAGNOSIS — J449 Chronic obstructive pulmonary disease, unspecified: Secondary | ICD-10-CM | POA: Diagnosis not present

## 2020-02-15 DIAGNOSIS — E119 Type 2 diabetes mellitus without complications: Secondary | ICD-10-CM | POA: Diagnosis not present

## 2020-02-15 DIAGNOSIS — Z7984 Long term (current) use of oral hypoglycemic drugs: Secondary | ICD-10-CM | POA: Diagnosis not present

## 2020-02-15 DIAGNOSIS — Z9884 Bariatric surgery status: Secondary | ICD-10-CM | POA: Diagnosis not present

## 2020-02-15 DIAGNOSIS — I10 Essential (primary) hypertension: Secondary | ICD-10-CM | POA: Insufficient documentation

## 2020-02-15 LAB — CBC WITH DIFFERENTIAL (CANCER CENTER ONLY)
Abs Immature Granulocytes: 0.01 10*3/uL (ref 0.00–0.07)
Basophils Absolute: 0 10*3/uL (ref 0.0–0.1)
Basophils Relative: 1 %
Eosinophils Absolute: 0.2 10*3/uL (ref 0.0–0.5)
Eosinophils Relative: 4 %
HCT: 31.5 % — ABNORMAL LOW (ref 39.0–52.0)
Hemoglobin: 9.1 g/dL — ABNORMAL LOW (ref 13.0–17.0)
Immature Granulocytes: 0 %
Lymphocytes Relative: 19 %
Lymphs Abs: 1.2 10*3/uL (ref 0.7–4.0)
MCH: 22.5 pg — ABNORMAL LOW (ref 26.0–34.0)
MCHC: 28.9 g/dL — ABNORMAL LOW (ref 30.0–36.0)
MCV: 77.8 fL — ABNORMAL LOW (ref 80.0–100.0)
Monocytes Absolute: 0.6 10*3/uL (ref 0.1–1.0)
Monocytes Relative: 9 %
Neutro Abs: 4.3 10*3/uL (ref 1.7–7.7)
Neutrophils Relative %: 67 %
Platelet Count: 247 10*3/uL (ref 150–400)
RBC: 4.05 MIL/uL — ABNORMAL LOW (ref 4.22–5.81)
RDW: 15.7 % — ABNORMAL HIGH (ref 11.5–15.5)
WBC Count: 6.3 10*3/uL (ref 4.0–10.5)
nRBC: 0 % (ref 0.0–0.2)

## 2020-02-15 LAB — CMP (CANCER CENTER ONLY)
ALT: 26 U/L (ref 0–44)
AST: 21 U/L (ref 15–41)
Albumin: 4.4 g/dL (ref 3.5–5.0)
Alkaline Phosphatase: 60 U/L (ref 38–126)
Anion gap: 6 (ref 5–15)
BUN: 33 mg/dL — ABNORMAL HIGH (ref 8–23)
CO2: 31 mmol/L (ref 22–32)
Calcium: 9.5 mg/dL (ref 8.9–10.3)
Chloride: 103 mmol/L (ref 98–111)
Creatinine: 0.95 mg/dL (ref 0.61–1.24)
GFR, Est AFR Am: >60 mL/min (ref >60)
GFR, Estimated: >60 mL/min (ref >60)
Glucose, Bld: 162 mg/dL — ABNORMAL HIGH (ref 70–99)
Potassium: 5.5 mmol/L — ABNORMAL HIGH (ref 3.5–5.1)
Sodium: 140 mmol/L (ref 135–145)
Total Bilirubin: 0.4 mg/dL (ref 0.3–1.2)
Total Protein: 7.1 g/dL (ref 6.5–8.1)

## 2020-02-15 LAB — FERRITIN: Ferritin: 20 ng/mL — ABNORMAL LOW (ref 24–336)

## 2020-02-15 LAB — SAVE SMEAR(SSMR), FOR PROVIDER SLIDE REVIEW

## 2020-02-15 LAB — IRON AND TIBC
Iron: 22 ug/dL — ABNORMAL LOW (ref 42–163)
Saturation Ratios: 5 % — ABNORMAL LOW (ref 20–55)
TIBC: 465 ug/dL — ABNORMAL HIGH (ref 202–409)
UIBC: 443 ug/dL — ABNORMAL HIGH (ref 117–376)

## 2020-02-15 NOTE — Progress Notes (Signed)
[facility] CONSULT NOTE  [name], [name] [name], [name]  HEME/ONC OVERVIEW: 1. Microcytic anemia -Likely due to hx of gastric bypass -2018: EGD/colonoscopy showed prior gastric bypass anatomy and tubular adenomas; no bleeding or malignancy   TREATMENT SUMMARY:  PRN IV iron (Feraheme)  PERTINENT NON-HEM/ONC PROBLEMS: 1. HFrEF (LVEF 45-50% in 2021)  ASSESSMENT & PLAN:   Microcytic anemia -I reviewed the patient's records in detail, including PCP and external gastroenterology clinic notes, lab studies, and imaging results -In summary, patient had hx of gastric bypass 10-11 years ago.  Review of his CBC showed baseline Hgb between 10 and 11.  Patient received Feraheme in the past with improvement in Hgb.  According to the gastroenterology clinic notes at [name], patient's last EDG and colonoscopy were done in 2018 for iron deficiency anemia, and showed prior gastric bypass anatomy and tubular adenomas, but there was no evidence of bleeding or malignancy.  Patient was referred to hematology for further management of iron deficiency anemia. -I reviewed the results in detail with the patient -Hgb 9.1 today; repeat iron profile pending -I personally reviewed the patient's peripheral blood smear today.  The red blood cells were microcytic with some hypochromia, c/w iron deficiency.  There was no schistocytosis.  The white blood cells were of normal morphology. There were no peripheral circulating blasts. The platelets were of normal size and I verified that there were no platelet clumping. -Given the history of relatively recent, unremarkable EGD and colonoscopy, the iron deficiency anemia is most likely due to malabsorption from history of gastric bypass -We discussed some of the risks, benefits, and alternatives of intravenous iron infusions.  -The patient is symptomatic  from anemia and the iron level is critically low; as such, oral supplement is not sufficient to replete iron storage quickly and he will need IV iron to higher levels of iron faster for adequate hematopoiesis.  -Some of the side-effects to be expected including risks of infusion reactions, phlebitis, headaches, nausea and fatigue.   -The patient is willing to proceed.  We will tentatively schedule the 1st dose next wee, plan for 2 doses.  -Goal is to keep ferritin level greater than 50. -We will repeat his labs in ~3 months to assess Hgb trend and any need for repeat IV iron  -Continue daily multivitamin   Hyperkalemia -Likely secondary to excess potassium supplement -Currently on KCl 3x/week with Lasix -K 5.5 today, improving since 02/12/2020; patient is asymptomatic  -I instructed the patient to hold his KCl supplement, and contact his PCP to repeat labs in 1-2 weeks to monitor K trend   Orders Placed This Encounter  Procedures  . CBC with Differential (Cancer Center Only)    Standing Status:   Future    Standing Expiration Date:   03/21/2021  . CMP (Cancer Center only)    Standing Status:   Future    Standing Expiration Date:   03/21/2021  . Ferritin    Standing Status:   Future    Standing Expiration Date:   03/21/2021  . Iron and TIBC    Standing Status:   Future    Standing Expiration Date:   03/21/2021   The total time spent in the encounter was 52 minutes, including face-to-face time with the patient, review of various tests results, order additional studies/medications, documentation, and coordination of care plan.   All questions were answered.  The patient knows to call the clinic with any problems, questions or concerns. No barriers to learning was detected.  Return in 3 months for labs and clinic follow-up.   Alec Holms, MD 3/8/20219:34 AM  CHIEF COMPLAINTS/PURPOSE OF CONSULTATION:  "I am just tired"  HISTORY OF PRESENTING ILLNESS:  Alec Taylor 74 y.o. male is  here because of history of iron deficiency anemia secondary to gastric bypass.  Patient reports that he had a gastric bypass 10 to 11 years ago, after which he began to develop some intermittent anemia, for which he had received IV iron in the past (most recently 3 to 4 years ago).  Due to his iron deficiency anemia, he had undergone EGD/colonoscopy in 2018, which did not show any source of bleeding.  He recently had a repeat colonoscopy in 12/2019 at [name], which was reportedly unremarkable except some polyps.  He is scheduled to have repeat colonoscopy in a year.  He feels tired all the time, but denies any constitutional symptoms or symptoms of bleeding.  He tried oral iron supplement in the past, but could not tolerate it due to stomach discomfort.  He denies any other complaint today.  REVIEW OF SYSTEMS:   Constitutional: ( - ) fevers, ( - )  chills , ( - ) night sweats Eyes: ( - ) blurriness of vision, ( - ) double vision, ( - ) watery eyes Ears, nose, mouth, throat, and face: ( - ) mucositis, ( - ) sore throat Respiratory: ( - ) cough, ( - ) dyspnea, ( - ) wheezes Cardiovascular: ( - ) palpitation, ( - ) chest discomfort, ( - ) lower extremity swelling Gastrointestinal:  ( - ) nausea, ( - ) heartburn, ( - ) change in bowel habits Skin: ( - ) abnormal skin rashes Lymphatics: ( - ) new lymphadenopathy, ( - ) easy bruising Neurological: ( - ) numbness, ( - ) tingling, ( - ) new weaknesses Behavioral/Psych: ( - ) mood change, ( - ) new changes  All other systems were reviewed with the patient and are negative.  I have reviewed his chart and materials related to his cancer extensively and collaborated history with the patient. Summary of oncologic history is as follows: Oncology History   No history exists.    MEDICAL HISTORY:  Past Medical History:  Diagnosis Date  . CAD (coronary artery disease) 09/2007   s/p CABG approximately 2004-[facility] (Cath - 17 Sep 2007:90% prox LAD with diffuse 85% narrowing to distal segment, 80% prox CXA, RCA: prox 90%, prox-mid 70%, mid 85%, distal 80%, D1 80%; Normal EF 65% --status post CABG--LIMA to LAD, SVG to PDA.    [name] COPD (chronic obstructive pulmonary disease) (HCC)   . Depression   . Diabetes mellitus without complication (HCC)   . Hypertension   . Ischemic cardiomyopathy   . Pleural effusion   . S/P CABG x 2    CABG--LIMA to LAD, SVG to PDA.      SURGICAL HISTORY: Past Surgical History:  Procedure Laterality Date  . APPENDECTOMY    . CHOLECYSTECTOMY    . CORONARY ARTERY BYPASS GRAFT    . GASTRIC BYPASS    . LEFT HEART CATH AND CORS/GRAFTS ANGIOGRAPHY N/A 04/30/2018   Procedure: LEFT HEART CATH AND CORS/GRAFTS ANGIOGRAPHY;  Surgeon: Marykay Lex, MD;  Location: [facility] INVASIVE CV LAB;  Service: Cardiovascular;  Laterality: N/A;  . TOE AMPUTATION      SOCIAL HISTORY: Social History  Socioeconomic History  . Marital status: Legally Separated    Spouse name: Not on file  . Number of children: Not on file  . Years of education: Not on file  . Highest education level: Not on file  Occupational History  . Not on file  Tobacco Use  . Smoking status: Never Smoker  . Smokeless tobacco: Never Used  Substance and Sexual Activity  . Alcohol use: Yes    Comment: occ  . Drug use: Never  . Sexual activity: Not on file  Other Topics Concern  . Not on file  Social History Narrative  . Not on file   Social Determinants of Health   Financial Resource Strain:   . Difficulty of Paying Living Expenses: Not on file  Food Insecurity:   . Worried About Programme researcher, broadcasting/film/video in the Last Year: Not on file  . Ran Out of Food in the Last Year: Not on file  Transportation Needs:   . Lack of Transportation (Medical): Not on file  . Lack of Transportation (Non-Medical): Not on file  Physical Activity:   . Days of Exercise per Week: Not on file  . Minutes of Exercise per Session: Not on file  Stress:   .  Feeling of Stress : Not on file  Social Connections:   . Frequency of Communication with Friends and Family: Not on file  . Frequency of Social Gatherings with Friends and Family: Not on file  . Attends Religious Services: Not on file  . Active Member of Clubs or Organizations: Not on file  . Attends Banker Meetings: Not on file  . Marital Status: Not on file  Intimate Partner Violence:   . Fear of Current or Ex-Partner: Not on file  . Emotionally Abused: Not on file  . Physically Abused: Not on file  . Sexually Abused: Not on file    FAMILY HISTORY: Family History  Problem Relation Age of Onset  . Hypertension Father     ALLERGIES:  is allergic to celecoxib; losartan; and ramipril.  MEDICATIONS:  Current Outpatient Medications  Medication Sig Dispense Refill  . ALPRAZolam (XANAX) 0.25 MG tablet TAKE 1/2 TO 1 TABLET BY MOUTH ONE TIME DAILY AS NEEDED FOR ANXIETY 30 tablet 1  . aspirin EC 81 MG tablet Take 81 mg by mouth daily.    [name] atorvastatin (LIPITOR) 20 MG tablet Take 1 tablet (20 mg total) by mouth daily. 90 tablet 3  . buPROPion (WELLBUTRIN XL) 150 MG 24 hr tablet Take 150 mg by mouth every morning.    . busPIRone (BUSPAR) 15 MG tablet Take 1/3 tablet p.o. twice daily for 1 week, then take 2/3 tablet p.o. twice daily for 1 week, then take 1 tablet p.o. twice daily 180 tablet 0  . carvedilol (COREG) 6.25 MG tablet Take 0.5 tablets (3.125 mg total) by mouth 2 (two) times daily with a meal. 60 tablet 6  . Cholecalciferol (VITAMIN D3) 5000 units CAPS Take 5,000 Units by mouth 2 (two) times daily.    . DULoxetine (CYMBALTA) 60 MG capsule TAKE ONE CAPSULE BY MOUTH ONE TIME DAILY 90 capsule 0  . empagliflozin (JARDIANCE) 10 MG TABS tablet Take 10 mg by mouth daily. 90 tablet 3  . finasteride (PROSCAR) 5 MG tablet Take 5 mg by mouth every evening.   6  . folic acid (FOLVITE) 1 MG tablet Take 1 mg by mouth daily.  2  . furosemide (LASIX) 20 MG tablet Take 1 tablet (20  mg total) by mouth 3 (three) times a week. Take 1 Tablet ( 20 mg ) 3 Times A Week  on Monday, Wednesday & Friday 12 tablet 11  . glimepiride (AMARYL) 2 MG tablet Take 2 mg by mouth every evening.  1  . metFORMIN (GLUCOPHAGE-XR) 500 MG 24 hr tablet Take 500 mg by mouth 2 (two) times daily.  1  . methotrexate (RHEUMATREX) 2.5 MG tablet Take 12.5 mg by mouth once a week. On Wednesday  3  . Multiple Vitamins-Minerals (MULTIVITAMIN ADULT PO) Take 1 tablet by mouth every morning.    [name] ofloxacin (OCUFLOX) 0.3 % ophthalmic solution Place 1 drop into both eyes 4 (four) times daily. Prior to procedures.    . pantoprazole (PROTONIX) 40 MG tablet Take 40 mg by mouth 2 (two) times daily.  0  . potassium chloride SA (KLOR-CON) 20 MEQ tablet Take 1 tablet (20 mEq total) by mouth 3 (three) times a week. Take 1 tablet ( 20 meq ) by mouth with Furosemide ( Lasix ) 3 Times A Week. 12 tablet 5  . saccharomyces boulardii ([employer] DIGESTIVE PROBIOTIC) 250 MG capsule Take 250 mg by mouth every morning.    . sacubitril-valsartan (ENTRESTO) 49-51 MG Take 1 tablet by mouth 2 (two) times daily. 180 tablet 3  . spironolactone (ALDACTONE) 25 MG tablet TAKE 1/2 TABLET BY MOUTH EVERY DAY 45 tablet 3  . triamcinolone cream (KENALOG) 0.5 % Apply 1 application topically 2 (two) times daily. 30 g 0  . zolpidem (AMBIEN CR) 12.5 MG CR tablet Take 1 tablet (12.5 mg total) by mouth at bedtime as needed for sleep. 90 tablet 1   No current facility-administered medications for this visit.    PHYSICAL EXAMINATION: ECOG PERFORMANCE STATUS: 2 - Symptomatic, <50% confined to bed  Vitals:   02/15/20 0920  BP: (!) 129/56  Pulse: 90  Resp: 18  Temp: (!) 96.8 F (36 C)  SpO2: 100%   Filed Weights   02/15/20 0920  Weight: 186 lb 12.8 oz (84.7 kg)    GENERAL: alert, no distress and comfortable SKIN: skin color, texture, turgor are normal, no rashes or significant lesions EYES: conjunctiva are pink and non-injected, sclera  clear OROPHARYNX: no exudate, no erythema; lips, buccal mucosa, and tongue normal  NECK: supple, non-tender LUNGS: clear to auscultation with normal breathing effort HEART: regular rate & rhythm, no murmurs, no lower extremity edema ABDOMEN: soft, non-tender, non-distended, normal bowel sounds Musculoskeletal: no cyanosis of digits and no clubbing  PSYCH: alert, fluent speech  LABORATORY DATA:  I have reviewed the data as listed Lab Results  Component Value Date   WBC 6.3 02/15/2020   HGB 9.1 (L) 02/15/2020   HCT 31.5 (L) 02/15/2020   MCV 77.8 (L) 02/15/2020   PLT 247 02/15/2020   Lab Results  Component Value Date   NA 140 02/15/2020   K 5.5 (H) 02/15/2020   CL 103 02/15/2020   CO2 31 02/15/2020    RADIOGRAPHIC STUDIES: I have personally reviewed the radiological images as listed and agreed with the findings in the report. ECHOCARDIOGRAM COMPLETE  Result Date: 02/12/2020    ECHOCARDIOGRAM REPORT   Patient Name:   LAVERNE KLUGH Date of Exam: 02/12/2020 Medical Rec #:  [id number]     Height:       70.0 in Accession #:    [id number]    Weight:       185.5 lb Date of Birth:  January 28, 1946      BSA:  2.022 m Patient Age:    73 years      BP:           108/68 mmHg Patient Gender: M             HR:           82 bpm. Exam Location:  Outpatient Procedure: 2D Echo and Strain Analysis Indications:    Congestive Heart Failure 428.0 / I50.9  History:        Patient has prior history of Echocardiogram examinations, most                 recent 12/15/2018. CAD, Prior CABG, Arrythmias:PVC,                 Signs/Symptoms:Shortness of Breath; Risk Factors:Hypertension,                 Diabetes and Dyslipidemia. Anemia.  Sonographer:    Leta Jungling RDCS Referring Phys: [id number] DANIEL R BENSIMHON IMPRESSIONS  1. Left ventricular ejection fraction, by estimation, is 45 to 50%. The left ventricle has mildly decreased function. The left ventricle demonstrates global hypokinesis. There is moderate concentric  left ventricular hypertrophy. Left ventricular diastolic parameters are consistent with Grade III diastolic dysfunction (restrictive). Elevated left atrial pressure. The average left ventricular global longitudinal strain is -7.5 %.  2. Right ventricular systolic function is mildly reduced. The right ventricular size is mildly enlarged. There is moderately elevated pulmonary artery systolic pressure. The estimated right ventricular systolic pressure is 44.5 mmHg.  3. Left atrial size was mildly dilated.  4. The mitral valve is normal in structure and function. Mild mitral valve regurgitation. No evidence of mitral stenosis.  5. The aortic valve is normal in structure and function. Aortic valve regurgitation is mild to moderate. No aortic stenosis is present.  6. The inferior vena cava is normal in size with greater than 50% respiratory variability, suggesting right atrial pressure of 3 mmHg. Comparison(s): Changes from prior study are noted. Since the last study on 12/15/2018 LVEF has increased from 35-40% to 45-50%. FINDINGS  Left Ventricle: Left ventricular ejection fraction, by estimation, is 45 to 50%. The left ventricle has mildly decreased function. The left ventricle demonstrates global hypokinesis. The average left ventricular global longitudinal strain is -7.5 %. The  left ventricular internal cavity size was normal in size. There is moderate concentric left ventricular hypertrophy. Left ventricular diastolic parameters are consistent with Grade III diastolic dysfunction (restrictive). Elevated left atrial pressure. Right Ventricle: The right ventricular size is mildly enlarged. No increase in right ventricular wall thickness. Right ventricular systolic function is mildly reduced. There is moderately elevated pulmonary artery systolic pressure. The tricuspid regurgitant velocity is 3.22 m/s, and with an assumed right atrial pressure of 3 mmHg, the estimated right ventricular systolic pressure is 44.5 mmHg.  Left Atrium: Left atrial size was mildly dilated. Right Atrium: Right atrial size was normal in size. Pericardium: There is no evidence of pericardial effusion. Mitral Valve: The mitral valve is normal in structure and function. There is mild thickening of the mitral valve leaflet(s). There is mild calcification of the mitral valve leaflet(s). Normal mobility of the mitral valve leaflets. Moderate mitral annular  calcification. Mild mitral valve regurgitation. No evidence of mitral valve stenosis. Tricuspid Valve: The tricuspid valve is normal in structure. Tricuspid valve regurgitation is mild . No evidence of tricuspid stenosis. Aortic Valve: The aortic valve is normal in structure and function.. There is moderate thickening and moderate calcification of  the aortic valve. Aortic valve regurgitation is mild to moderate. Aortic regurgitation PHT measures 380 msec. No aortic stenosis is present. There is moderate thickening of the aortic valve. There is moderate calcification of the aortic valve. Pulmonic Valve: The pulmonic valve was normal in structure. Pulmonic valve regurgitation is not visualized. No evidence of pulmonic stenosis. Aorta: The aortic root is normal in size and structure. Venous: The inferior vena cava is normal in size with greater than 50% respiratory variability, suggesting right atrial pressure of 3 mmHg. IAS/Shunts: No atrial level shunt detected by color flow Doppler.  LEFT VENTRICLE PLAX 2D LVIDd:         4.70 cm      Diastology LVIDs:         3.90 cm      LV e' lateral:   5.33 cm/s LV PW:         1.60 cm      LV E/e' lateral: 27.5 LV IVS:        1.20 cm      LV e' medial:    4.35 cm/s LVOT diam:     2.30 cm      LV E/e' medial:  33.6 LV SV:         99 LV SV Index:   49           2D Longitudinal Strain LVOT Area:     4.15 cm     2D Strain GLS Avg:     -7.5 %  LV Volumes (MOD) LV vol d, MOD A2C: 168.0 ml LV vol d, MOD A4C: 169.0 ml LV vol s, MOD A2C: 88.1 ml LV vol s, MOD A4C: 108.0 ml LV SV  MOD A2C:     79.9 ml LV SV MOD A4C:     169.0 ml LV SV MOD BP:      68.4 ml RIGHT VENTRICLE RV S prime:     7.40 cm/s TAPSE (M-mode): 1.4 cm LEFT ATRIUM           Index       RIGHT ATRIUM           Index LA diam:      4.20 cm 2.08 cm/m  RA Area:     14.10 cm LA Vol (A2C): 62.6 ml 30.97 ml/m RA Volume:   29.90 ml  14.79 ml/m LA Vol (A4C): 56.0 ml 27.70 ml/m  AORTIC VALVE LVOT Vmax:   104.00 cm/s LVOT Vmean:  83.400 cm/s LVOT VTI:    0.238 m AI PHT:      380 msec  AORTA Ao Root diam: 3.10 cm Ao Asc diam:  3.20 cm MITRAL VALVE                TRICUSPID VALVE MV Area (PHT): 3.90 cm     TR Peak grad:   41.5 mmHg MV Decel Time: 194 msec     TR Vmax:        322.00 cm/s MV E velocity: 146.33 cm/s                             SHUNTS                             Systemic VTI:  0.24 m  Systemic Diam: 2.30 cm Ena Dawley MD Electronically signed by Ena Dawley MD Signature Date/Time: 02/12/2020/8:14:38 PM    Final     PATHOLOGY: I have reviewed the pathology reports as documented in the oncologist history.

## 2020-02-15 NOTE — Telephone Encounter (Signed)
Pt aware to stop spiro.will have labs rechecked at upcoming procedure 02/18/20 with DB   Trevor Iha, Geroge Baseman, RN  02/15/2020 10:58 AM EST    Pt had repeat bmet today, 3/8 at [facility], K was 5.5, Dr [name] aware, advised to have pt d/c Cleda Daub, make sure lab recheck in in 1-2 weeks. Attempted to call pt back and Left message to call back    Bensimhon, Bevelyn Buckles, MD  02/14/2020 8:35 PM EST    Stop spiro. Check BMET on Monday am

## 2020-02-15 NOTE — Telephone Encounter (Signed)
As noted below by Dr. Dion Body, I informed him of his potassium level. Instructed him to stop the potassium and follow up with his PCP. He verbalized understanding.

## 2020-02-15 NOTE — Telephone Encounter (Signed)
-----   Message from Arthur Holms, MD sent at 02/15/2020  9:27 AM EST ----- Truddie Hidden,  Can you let Mr. Giovanelli know that his K is a little elevated, and he should hold his potassium supplement? He should follow up with his PCP in 1-2 weeks to have his K repeated.  Thanks.  GZ  ----- Message ----- From: Leory Plowman, Lab In [place] Sent: 02/15/2020   8:47 AM EST To: Arthur Holms, MD

## 2020-02-16 ENCOUNTER — Inpatient Hospital Stay: Payer: Self-pay | Admitting: Hematology

## 2020-02-16 ENCOUNTER — Other Ambulatory Visit: Payer: Self-pay

## 2020-02-16 DIAGNOSIS — M2042 Other hammer toe(s) (acquired), left foot: Secondary | ICD-10-CM | POA: Diagnosis not present

## 2020-02-16 DIAGNOSIS — Z89422 Acquired absence of other left toe(s): Secondary | ICD-10-CM | POA: Diagnosis not present

## 2020-02-16 DIAGNOSIS — M216X2 Other acquired deformities of left foot: Secondary | ICD-10-CM | POA: Diagnosis not present

## 2020-02-16 LAB — SARS CORONAVIRUS 2 (TAT 6-24 HRS): SARS Coronavirus 2: NEGATIVE

## 2020-02-18 ENCOUNTER — Other Ambulatory Visit: Payer: Self-pay

## 2020-02-18 ENCOUNTER — Encounter (HOSPITAL_COMMUNITY)
Admission: RE | Disposition: A | Discharge status: Home / Self Care | Payer: Self-pay | Source: Home / Self Care | Attending: Internal Medicine

## 2020-02-18 ENCOUNTER — Ambulatory Visit (HOSPITAL_COMMUNITY)
Admission: RE | Admit: 2020-02-18 | Discharge: 2020-02-18 | Disposition: A | Discharge status: Home / Self Care | Payer: BC Managed Care – PPO | Attending: Internal Medicine | Admitting: Internal Medicine

## 2020-02-18 DIAGNOSIS — Z7984 Long term (current) use of oral hypoglycemic drugs: Secondary | ICD-10-CM | POA: Diagnosis not present

## 2020-02-18 DIAGNOSIS — E669 Obesity, unspecified: Secondary | ICD-10-CM | POA: Diagnosis not present

## 2020-02-18 DIAGNOSIS — Z9884 Bariatric surgery status: Secondary | ICD-10-CM | POA: Insufficient documentation

## 2020-02-18 DIAGNOSIS — D649 Anemia, unspecified: Secondary | ICD-10-CM | POA: Diagnosis not present

## 2020-02-18 DIAGNOSIS — E785 Hyperlipidemia, unspecified: Secondary | ICD-10-CM | POA: Diagnosis not present

## 2020-02-18 DIAGNOSIS — I255 Ischemic cardiomyopathy: Secondary | ICD-10-CM | POA: Insufficient documentation

## 2020-02-18 DIAGNOSIS — I110 Hypertensive heart disease with heart failure: Secondary | ICD-10-CM | POA: Diagnosis not present

## 2020-02-18 DIAGNOSIS — Z6826 Body mass index (BMI) 26.0-26.9, adult: Secondary | ICD-10-CM | POA: Insufficient documentation

## 2020-02-18 DIAGNOSIS — J449 Chronic obstructive pulmonary disease, unspecified: Secondary | ICD-10-CM | POA: Insufficient documentation

## 2020-02-18 DIAGNOSIS — I25810 Atherosclerosis of coronary artery bypass graft(s) without angina pectoris: Secondary | ICD-10-CM

## 2020-02-18 DIAGNOSIS — R0600 Dyspnea, unspecified: Secondary | ICD-10-CM

## 2020-02-18 DIAGNOSIS — Z951 Presence of aortocoronary bypass graft: Secondary | ICD-10-CM | POA: Diagnosis not present

## 2020-02-18 DIAGNOSIS — Z7982 Long term (current) use of aspirin: Secondary | ICD-10-CM | POA: Diagnosis not present

## 2020-02-18 DIAGNOSIS — Z79899 Other long term (current) drug therapy: Secondary | ICD-10-CM | POA: Insufficient documentation

## 2020-02-18 DIAGNOSIS — F329 Major depressive disorder, single episode, unspecified: Secondary | ICD-10-CM | POA: Diagnosis not present

## 2020-02-18 DIAGNOSIS — I5022 Chronic systolic (congestive) heart failure: Secondary | ICD-10-CM | POA: Diagnosis not present

## 2020-02-18 DIAGNOSIS — I2510 Atherosclerotic heart disease of native coronary artery without angina pectoris: Secondary | ICD-10-CM | POA: Insufficient documentation

## 2020-02-18 DIAGNOSIS — E119 Type 2 diabetes mellitus without complications: Secondary | ICD-10-CM | POA: Diagnosis not present

## 2020-02-18 HISTORY — PX: RIGHT/LEFT HEART CATH AND CORONARY/GRAFT ANGIOGRAPHY: CATH118267

## 2020-02-18 LAB — POCT I-STAT 7, (LYTES, BLD GAS, ICA,H+H)
Acid-Base Excess: 2 mmol/L (ref 0.0–2.0)
Bicarbonate: 28 mmol/L (ref 20.0–28.0)
Calcium, Ion: 1.16 mmol/L (ref 1.15–1.40)
HCT: 27 % — ABNORMAL LOW (ref 39.0–52.0)
Hemoglobin: 9.2 g/dL — ABNORMAL LOW (ref 13.0–17.0)
O2 Saturation: 99 %
Potassium: 4.3 mmol/L (ref 3.5–5.1)
Sodium: 141 mmol/L (ref 135–145)
TCO2: 29 mmol/L (ref 22–32)
pCO2 arterial: 48.4 mmHg — ABNORMAL HIGH (ref 32.0–48.0)
pH, Arterial: 7.37 (ref 7.350–7.450)
pO2, Arterial: 155 mmHg — ABNORMAL HIGH (ref 83.0–108.0)

## 2020-02-18 LAB — POCT I-STAT EG7
Acid-Base Excess: 2 mmol/L (ref 0.0–2.0)
Acid-base deficit: 1 mmol/L (ref 0.0–2.0)
Acid-base deficit: 1 mmol/L (ref 0.0–2.0)
Bicarbonate: 25.3 mmol/L (ref 20.0–28.0)
Bicarbonate: 25.9 mmol/L (ref 20.0–28.0)
Bicarbonate: 28.1 mmol/L — ABNORMAL HIGH (ref 20.0–28.0)
Calcium, Ion: 1.04 mmol/L — ABNORMAL LOW (ref 1.15–1.40)
Calcium, Ion: 1.06 mmol/L — ABNORMAL LOW (ref 1.15–1.40)
Calcium, Ion: 1.16 mmol/L (ref 1.15–1.40)
HCT: 25 % — ABNORMAL LOW (ref 39.0–52.0)
HCT: 27 % — ABNORMAL LOW (ref 39.0–52.0)
HCT: 28 % — ABNORMAL LOW (ref 39.0–52.0)
Hemoglobin: 8.5 g/dL — ABNORMAL LOW (ref 13.0–17.0)
Hemoglobin: 9.2 g/dL — ABNORMAL LOW (ref 13.0–17.0)
Hemoglobin: 9.5 g/dL — ABNORMAL LOW (ref 13.0–17.0)
O2 Saturation: 69 %
O2 Saturation: 72 %
O2 Saturation: 74 %
Potassium: 3.8 mmol/L (ref 3.5–5.1)
Potassium: 3.8 mmol/L (ref 3.5–5.1)
Potassium: 4.3 mmol/L (ref 3.5–5.1)
Sodium: 141 mmol/L (ref 135–145)
Sodium: 143 mmol/L (ref 135–145)
Sodium: 146 mmol/L — ABNORMAL HIGH (ref 135–145)
TCO2: 27 mmol/L (ref 22–32)
TCO2: 27 mmol/L (ref 22–32)
TCO2: 30 mmol/L (ref 22–32)
pCO2, Ven: 50.2 mmHg (ref 44.0–60.0)
pCO2, Ven: 50.9 mmHg (ref 44.0–60.0)
pCO2, Ven: 52.7 mmHg (ref 44.0–60.0)
pH, Ven: 7.311 (ref 7.250–7.430)
pH, Ven: 7.315 (ref 7.250–7.430)
pH, Ven: 7.335 (ref 7.250–7.430)
pO2, Ven: 39 mmHg (ref 32.0–45.0)
pO2, Ven: 42 mmHg (ref 32.0–45.0)
pO2, Ven: 43 mmHg (ref 32.0–45.0)

## 2020-02-18 LAB — GLUCOSE, CAPILLARY: Glucose-Capillary: 100 mg/dL — ABNORMAL HIGH (ref 70–99)

## 2020-02-18 SURGERY — RIGHT/LEFT HEART CATH AND CORONARY/GRAFT ANGIOGRAPHY
Anesthesia: LOCAL

## 2020-02-18 MED ORDER — ASPIRIN 81 MG PO CHEW
81.0000 mg | CHEWABLE_TABLET | ORAL | Status: AC
  Administered 2020-02-18: 81 mg via ORAL
  Filled 2020-02-18: qty 1

## 2020-02-18 MED ORDER — LABETALOL HCL 5 MG/ML IV SOLN: 10.0000 mg | INTRAVENOUS | Status: DC | PRN

## 2020-02-18 MED ORDER — ACETAMINOPHEN 325 MG PO TABS: 650.0000 mg | ORAL_TABLET | ORAL | Status: DC | PRN

## 2020-02-18 MED ORDER — MIDAZOLAM HCL 2 MG/2ML IJ SOLN
INTRAMUSCULAR | Status: AC
  Filled 2020-02-18: qty 2

## 2020-02-18 MED ORDER — FENTANYL CITRATE (PF) 100 MCG/2ML IJ SOLN
INTRAMUSCULAR | Status: DC | PRN
  Administered 2020-02-18: 25 ug via INTRAVENOUS

## 2020-02-18 MED ORDER — SODIUM CHLORIDE 0.9 % IV SOLN: INTRAVENOUS | Status: AC

## 2020-02-18 MED ORDER — SODIUM CHLORIDE 0.9 % IV SOLN: INTRAVENOUS | Status: DC

## 2020-02-18 MED ORDER — VERAPAMIL HCL 2.5 MG/ML IV SOLN
INTRAVENOUS | Status: AC
  Filled 2020-02-18: qty 2

## 2020-02-18 MED ORDER — HYDRALAZINE HCL 20 MG/ML IJ SOLN: 10.0000 mg | INTRAMUSCULAR | Status: DC | PRN

## 2020-02-18 MED ORDER — FENTANYL CITRATE (PF) 100 MCG/2ML IJ SOLN
INTRAMUSCULAR | Status: AC
  Filled 2020-02-18: qty 2

## 2020-02-18 MED ORDER — HEPARIN (PORCINE) IN NACL 1000-0.9 UT/500ML-% IV SOLN
INTRAVENOUS | Status: AC
  Filled 2020-02-18: qty 1000

## 2020-02-18 MED ORDER — SODIUM CHLORIDE 0.9% FLUSH: 3.0000 mL | INTRAVENOUS | Status: DC | PRN

## 2020-02-18 MED ORDER — LIDOCAINE HCL (PF) 1 % IJ SOLN
INTRAMUSCULAR | Status: DC | PRN
  Administered 2020-02-18 (×2): 2 mL

## 2020-02-18 MED ORDER — HEPARIN (PORCINE) IN NACL 1000-0.9 UT/500ML-% IV SOLN
INTRAVENOUS | Status: DC | PRN
  Administered 2020-02-18 (×2): 500 mL

## 2020-02-18 MED ORDER — LIDOCAINE HCL (PF) 1 % IJ SOLN
INTRAMUSCULAR | Status: AC
  Filled 2020-02-18: qty 30

## 2020-02-18 MED ORDER — SODIUM CHLORIDE 0.9% FLUSH: 3.0000 mL | Freq: Two times a day (BID) | INTRAVENOUS | Status: DC

## 2020-02-18 MED ORDER — HEPARIN SODIUM (PORCINE) 1000 UNIT/ML IJ SOLN
INTRAMUSCULAR | Status: AC
  Filled 2020-02-18: qty 1

## 2020-02-18 MED ORDER — SODIUM CHLORIDE 0.9 % IV SOLN: 250.0000 mL | INTRAVENOUS | Status: DC | PRN

## 2020-02-18 MED ORDER — IOHEXOL 350 MG/ML SOLN
INTRAVENOUS | Status: DC | PRN
  Administered 2020-02-18: 70 mL

## 2020-02-18 MED ORDER — HEPARIN SODIUM (PORCINE) 1000 UNIT/ML IJ SOLN
INTRAMUSCULAR | Status: DC | PRN
  Administered 2020-02-18: 4000 [IU] via INTRAVENOUS

## 2020-02-18 MED ORDER — MIDAZOLAM HCL 2 MG/2ML IJ SOLN
INTRAMUSCULAR | Status: DC | PRN
  Administered 2020-02-18: 1 mg via INTRAVENOUS

## 2020-02-18 MED ORDER — VERAPAMIL HCL 2.5 MG/ML IV SOLN
INTRAVENOUS | Status: DC | PRN
  Administered 2020-02-18: 10 mL via INTRA_ARTERIAL

## 2020-02-18 SURGICAL SUPPLY — 13 items
CATH BALLN WEDGE 5F 110CM (CATHETERS) ×2 IMPLANT
CATH IMPULSE 5F ANG/FL3.5 (CATHETERS) ×2 IMPLANT
DEVICE RAD COMP TR BAND LRG (VASCULAR PRODUCTS) ×4 IMPLANT
GLIDESHEATH SLEND SS 6F .021 (SHEATH) ×2 IMPLANT
GUIDEWIRE .025 260CM (WIRE) ×2 IMPLANT
GUIDEWIRE INQWIRE 1.5J.035X260 (WIRE) ×1 IMPLANT
INQWIRE 1.5J .035X260CM (WIRE) ×2
KIT HEART LEFT (KITS) ×2 IMPLANT
PACK CARDIAC CATHETERIZATION (CUSTOM PROCEDURE TRAY) ×2 IMPLANT
SHEATH GLIDE SLENDER 4/5FR (SHEATH) ×2 IMPLANT
TRANSDUCER W/STOPCOCK (MISCELLANEOUS) ×2 IMPLANT
TUBING ART PRESS 72  MALE/FEM (TUBING) ×2
TUBING ART PRESS 72 MALE/FEM (TUBING) ×1 IMPLANT

## 2020-02-18 NOTE — Discharge Instructions (Signed)
Radial Site Care  This sheet gives you information about how to care for yourself after your procedure. Your health care provider may also give you more specific instructions. If you have problems or questions, contact your health care provider. What can I expect after the procedure? After the procedure, it is common to have:  Bruising and tenderness at the catheter insertion area. Follow these instructions at home: Medicines  Take over-the-counter and prescription medicines only as told by your health care provider. Insertion site care  Follow instructions from your health care provider about how to take care of your insertion site. Make sure you: ? Wash your hands with soap and water before you change your bandage (dressing). If soap and water are not available, use hand sanitizer. ? Change your dressing as told by your health care provider. ? Leave stitches (sutures), skin glue, or adhesive strips in place. These skin closures may need to stay in place for 2 weeks or longer. If adhesive strip edges start to loosen and curl up, you may trim the loose edges. Do not remove adhesive strips completely unless your health care provider tells you to do that.  Check your insertion site every day for signs of infection. Check for: ? Redness, swelling, or pain. ? Fluid or blood. ? Pus or a bad smell. ? Warmth.  Do not take baths, swim, or use a hot tub until your health care provider approves.  You may shower 24-48 hours after the procedure, or as directed by your health care provider. ? Remove the dressing and gently wash the site with plain soap and water. ? Pat the area dry with a clean towel. ? Do not rub the site. That could cause bleeding.  Do not apply powder or lotion to the site. Activity   For 24 hours after the procedure, or as directed by your health care provider: ? Do not flex or bend the affected arm. ? Do not push or pull heavy objects with the affected arm. ? Do not  drive yourself home from the hospital or clinic. You may drive 24 hours after the procedure unless your health care provider tells you not to. ? Do not operate machinery or power tools.  Do not lift anything that is heavier than 10 lb (4.5 kg), or the limit that you are told, until your health care provider says that it is safe.  Ask your health care provider when it is okay to: ? Return to work or school. ? Resume usual physical activities or sports. ? Resume sexual activity. General instructions  If the catheter site starts to bleed, raise your arm and put firm pressure on the site. If the bleeding does not stop, get help right away. This is a medical emergency.  If you went home on the same day as your procedure, a responsible adult should be with you for the first 24 hours after you arrive home.  Keep all follow-up visits as told by your health care provider. This is important. Contact a health care provider if:  You have a fever.  You have redness, swelling, or yellow drainage around your insertion site. Get help right away if:  You have unusual pain at the radial site.  The catheter insertion area swells very fast.  The insertion area is bleeding, and the bleeding does not stop when you hold steady pressure on the area.  Your arm or hand becomes pale, cool, tingly, or numb. These symptoms may represent a serious problem   that is an emergency. Do not wait to see if the symptoms will go away. Get medical help right away. Call your local emergency services (911 in the [place] Do not drive yourself to the hospital. Summary  After the procedure, it is common to have bruising and tenderness at the site.  Follow instructions from your health care provider about how to take care of your radial site wound. Check the wound every day for signs of infection.  Do not lift anything that is heavier than 10 lb (4.5 kg), or the limit that you are told, until your health care provider says  that it is safe. This information is not intended to replace advice given to you by your health care provider. Make sure you discuss any questions you have with your health care provider. Document Revised: 01/01/2018 Document Reviewed: 01/01/2018 Elsevier Patient Education  2020 [employer]  

## 2020-02-18 NOTE — Interval H&P Note (Signed)
History and Physical Interval Note:  02/18/2020 10:14 AM  Alec Taylor  has presented today for surgery, with the diagnosis of Heart failure.  The various methods of treatment have been discussed with the patient and family. After consideration of risks, benefits and other options for treatment, the patient has consented to  Procedure(s): RIGHT/LEFT HEART CATH AND CORONARY/GRAFT ANGIOGRAPHY (N/A) and possible coronary angioplasty as a surgical intervention.  The patient's history has been reviewed, patient examined, no change in status, stable for surgery.  I have reviewed the patient's chart and labs.  Questions were answered to the patient's satisfaction.     [name]

## 2020-02-18 NOTE — Progress Notes (Signed)
Per tech Parke Simmers, pt had right forearm skin tear when she arrived in the room pt was standing and trying to get out of bed. Tech placed a tegaderm dressing on site.

## 2020-02-22 ENCOUNTER — Other Ambulatory Visit: Payer: Self-pay | Admitting: Family

## 2020-02-22 ENCOUNTER — Other Ambulatory Visit: Payer: Self-pay

## 2020-02-22 ENCOUNTER — Other Ambulatory Visit: Payer: Self-pay | Admitting: Family Medicine

## 2020-02-22 ENCOUNTER — Inpatient Hospital Stay: Payer: BC Managed Care – PPO

## 2020-02-22 ENCOUNTER — Other Ambulatory Visit: Payer: Self-pay | Admitting: Hematology

## 2020-02-22 ENCOUNTER — Other Ambulatory Visit (HOSPITAL_COMMUNITY): Payer: Self-pay | Admitting: *Deleted

## 2020-02-22 VITALS — BP 119/57 | HR 79 | Temp 97.3°F | Resp 18 | Wt 185.0 lb

## 2020-02-22 DIAGNOSIS — D509 Iron deficiency anemia, unspecified: Secondary | ICD-10-CM | POA: Insufficient documentation

## 2020-02-22 DIAGNOSIS — F329 Major depressive disorder, single episode, unspecified: Secondary | ICD-10-CM | POA: Diagnosis not present

## 2020-02-22 DIAGNOSIS — E875 Hyperkalemia: Secondary | ICD-10-CM | POA: Diagnosis not present

## 2020-02-22 DIAGNOSIS — Z9884 Bariatric surgery status: Secondary | ICD-10-CM | POA: Diagnosis not present

## 2020-02-22 DIAGNOSIS — J449 Chronic obstructive pulmonary disease, unspecified: Secondary | ICD-10-CM | POA: Diagnosis not present

## 2020-02-22 DIAGNOSIS — I2510 Atherosclerotic heart disease of native coronary artery without angina pectoris: Secondary | ICD-10-CM | POA: Diagnosis not present

## 2020-02-22 DIAGNOSIS — I10 Essential (primary) hypertension: Secondary | ICD-10-CM | POA: Diagnosis not present

## 2020-02-22 DIAGNOSIS — E119 Type 2 diabetes mellitus without complications: Secondary | ICD-10-CM | POA: Diagnosis not present

## 2020-02-22 DIAGNOSIS — Z7984 Long term (current) use of oral hypoglycemic drugs: Secondary | ICD-10-CM | POA: Diagnosis not present

## 2020-02-22 MED ORDER — ATORVASTATIN CALCIUM 20 MG PO TABS: 20.0000 mg | ORAL_TABLET | Freq: Every day | ORAL | 3 refills | Status: DC

## 2020-02-22 MED ORDER — SODIUM CHLORIDE 0.9 % IV SOLN
Freq: Once | INTRAVENOUS | Status: AC
  Filled 2020-02-22: qty 250

## 2020-02-22 MED ORDER — SODIUM CHLORIDE 0.9 % IV SOLN: 510.0000 mg | Freq: Once | INTRAVENOUS | Status: DC

## 2020-02-22 MED ORDER — SODIUM CHLORIDE 0.9 % IV SOLN
Freq: Once | INTRAVENOUS | Status: DC
  Filled 2020-02-22: qty 250

## 2020-02-22 MED ORDER — SODIUM CHLORIDE 0.9 % IV SOLN
200.0000 mg | Freq: Once | INTRAVENOUS | Status: AC
  Administered 2020-02-22: 200 mg via INTRAVENOUS
  Filled 2020-02-22: qty 10

## 2020-02-22 MED ORDER — FUROSEMIDE 20 MG PO TABS: 20.0000 mg | ORAL_TABLET | ORAL | 3 refills | Status: DC

## 2020-02-22 NOTE — Patient Instructions (Signed)

## 2020-02-23 DIAGNOSIS — L639 Alopecia areata, unspecified: Secondary | ICD-10-CM | POA: Diagnosis not present

## 2020-02-23 DIAGNOSIS — L649 Androgenic alopecia, unspecified: Secondary | ICD-10-CM | POA: Diagnosis not present

## 2020-02-23 DIAGNOSIS — Z5181 Encounter for therapeutic drug level monitoring: Secondary | ICD-10-CM | POA: Diagnosis not present

## 2020-02-24 ENCOUNTER — Other Ambulatory Visit (HOSPITAL_COMMUNITY): Payer: Self-pay | Admitting: Internal Medicine

## 2020-02-25 ENCOUNTER — Other Ambulatory Visit (HOSPITAL_COMMUNITY): Payer: Self-pay

## 2020-02-25 MED ORDER — SPIRONOLACTONE 25 MG PO TABS: 12.5000 mg | ORAL_TABLET | Freq: Every day | ORAL | 3 refills | Status: DC

## 2020-02-25 MED ORDER — FUROSEMIDE 20 MG PO TABS: 20.0000 mg | ORAL_TABLET | ORAL | 3 refills | Status: DC

## 2020-02-29 ENCOUNTER — Other Ambulatory Visit: Payer: Self-pay | Admitting: Psychiatry

## 2020-02-29 ENCOUNTER — Other Ambulatory Visit: Payer: Self-pay | Admitting: *Deleted

## 2020-02-29 ENCOUNTER — Inpatient Hospital Stay: Payer: BC Managed Care – PPO

## 2020-02-29 ENCOUNTER — Other Ambulatory Visit: Payer: Self-pay

## 2020-02-29 VITALS — BP 127/52 | HR 79 | Temp 97.2°F | Resp 18

## 2020-02-29 DIAGNOSIS — Z9884 Bariatric surgery status: Secondary | ICD-10-CM | POA: Diagnosis not present

## 2020-02-29 DIAGNOSIS — F329 Major depressive disorder, single episode, unspecified: Secondary | ICD-10-CM | POA: Diagnosis not present

## 2020-02-29 DIAGNOSIS — Z7984 Long term (current) use of oral hypoglycemic drugs: Secondary | ICD-10-CM | POA: Diagnosis not present

## 2020-02-29 DIAGNOSIS — D509 Iron deficiency anemia, unspecified: Secondary | ICD-10-CM

## 2020-02-29 DIAGNOSIS — I2510 Atherosclerotic heart disease of native coronary artery without angina pectoris: Secondary | ICD-10-CM | POA: Diagnosis not present

## 2020-02-29 DIAGNOSIS — E119 Type 2 diabetes mellitus without complications: Secondary | ICD-10-CM | POA: Diagnosis not present

## 2020-02-29 DIAGNOSIS — I10 Essential (primary) hypertension: Secondary | ICD-10-CM | POA: Diagnosis not present

## 2020-02-29 DIAGNOSIS — E875 Hyperkalemia: Secondary | ICD-10-CM | POA: Diagnosis not present

## 2020-02-29 DIAGNOSIS — J449 Chronic obstructive pulmonary disease, unspecified: Secondary | ICD-10-CM | POA: Diagnosis not present

## 2020-02-29 MED ORDER — SODIUM CHLORIDE 0.9 % IV SOLN
200.0000 mg | Freq: Once | INTRAVENOUS | Status: AC
  Administered 2020-02-29: 200 mg via INTRAVENOUS
  Filled 2020-02-29: qty 200

## 2020-02-29 MED ORDER — SODIUM CHLORIDE 0.9 % IV SOLN
Freq: Once | INTRAVENOUS | Status: AC
  Filled 2020-02-29: qty 250

## 2020-02-29 NOTE — Patient Instructions (Signed)

## 2020-03-03 ENCOUNTER — Other Ambulatory Visit: Payer: Self-pay | Admitting: Psychiatry

## 2020-03-03 DIAGNOSIS — F411 Generalized anxiety disorder: Secondary | ICD-10-CM

## 2020-03-03 NOTE — Telephone Encounter (Signed)
Last apt 10/23/2019

## 2020-04-04 DIAGNOSIS — Z89412 Acquired absence of left great toe: Secondary | ICD-10-CM | POA: Diagnosis not present

## 2020-04-14 ENCOUNTER — Encounter (HOSPITAL_COMMUNITY): Payer: Self-pay | Admitting: Internal Medicine

## 2020-04-14 ENCOUNTER — Inpatient Hospital Stay (HOSPITAL_COMMUNITY): Admission: RE | Admit: 2020-04-14 | Payer: Self-pay | Source: Ambulatory Visit

## 2020-04-27 ENCOUNTER — Other Ambulatory Visit: Payer: Self-pay | Admitting: Psychiatry

## 2020-05-02 ENCOUNTER — Other Ambulatory Visit: Payer: Self-pay | Admitting: Psychiatry

## 2020-05-02 DIAGNOSIS — F5101 Primary insomnia: Secondary | ICD-10-CM

## 2020-05-02 DIAGNOSIS — F411 Generalized anxiety disorder: Secondary | ICD-10-CM

## 2020-05-02 DIAGNOSIS — F3341 Major depressive disorder, recurrent, in partial remission: Secondary | ICD-10-CM

## 2020-05-02 NOTE — Telephone Encounter (Signed)
Last apt was 10/2019

## 2020-05-16 ENCOUNTER — Inpatient Hospital Stay: Payer: BC Managed Care – PPO

## 2020-05-16 ENCOUNTER — Encounter: Payer: Self-pay | Admitting: Family

## 2020-05-16 ENCOUNTER — Inpatient Hospital Stay: Payer: BC Managed Care – PPO | Attending: Hematology & Oncology | Admitting: Family

## 2020-05-16 ENCOUNTER — Other Ambulatory Visit: Payer: Self-pay

## 2020-05-16 ENCOUNTER — Telehealth: Payer: Self-pay | Admitting: Family

## 2020-05-16 VITALS — BP 111/57 | HR 81 | Temp 96.8°F | Resp 16 | Wt 187.0 lb

## 2020-05-16 DIAGNOSIS — D508 Other iron deficiency anemias: Secondary | ICD-10-CM | POA: Diagnosis not present

## 2020-05-16 DIAGNOSIS — Z79899 Other long term (current) drug therapy: Secondary | ICD-10-CM | POA: Diagnosis not present

## 2020-05-16 DIAGNOSIS — R202 Paresthesia of skin: Secondary | ICD-10-CM | POA: Insufficient documentation

## 2020-05-16 DIAGNOSIS — D509 Iron deficiency anemia, unspecified: Secondary | ICD-10-CM | POA: Insufficient documentation

## 2020-05-16 DIAGNOSIS — Z9884 Bariatric surgery status: Secondary | ICD-10-CM | POA: Diagnosis not present

## 2020-05-16 DIAGNOSIS — K909 Intestinal malabsorption, unspecified: Secondary | ICD-10-CM

## 2020-05-16 DIAGNOSIS — R200 Anesthesia of skin: Secondary | ICD-10-CM | POA: Insufficient documentation

## 2020-05-16 LAB — CMP (CANCER CENTER ONLY)
ALT: 26 U/L (ref 0–44)
AST: 22 U/L (ref 15–41)
Albumin: 4.4 g/dL (ref 3.5–5.0)
Alkaline Phosphatase: 70 U/L (ref 38–126)
Anion gap: 6 (ref 5–15)
BUN: 49 mg/dL — ABNORMAL HIGH (ref 8–23)
CO2: 31 mmol/L (ref 22–32)
Calcium: 9.3 mg/dL (ref 8.9–10.3)
Chloride: 104 mmol/L (ref 98–111)
Creatinine: 1.12 mg/dL (ref 0.61–1.24)
GFR, Est AFR Am: >60 mL/min (ref >60)
GFR, Estimated: >60 mL/min (ref >60)
Glucose, Bld: 151 mg/dL — ABNORMAL HIGH (ref 70–99)
Potassium: 5.4 mmol/L — ABNORMAL HIGH (ref 3.5–5.1)
Sodium: 141 mmol/L (ref 135–145)
Total Bilirubin: 0.4 mg/dL (ref 0.3–1.2)
Total Protein: 7.3 g/dL (ref 6.5–8.1)

## 2020-05-16 LAB — FERRITIN: Ferritin: 30 ng/mL (ref 24–336)

## 2020-05-16 LAB — CBC WITH DIFFERENTIAL (CANCER CENTER ONLY)
Abs Immature Granulocytes: 0.04 10*3/uL (ref 0.00–0.07)
Basophils Absolute: 0.1 10*3/uL (ref 0.0–0.1)
Basophils Relative: 1 %
Eosinophils Absolute: 0.4 10*3/uL (ref 0.0–0.5)
Eosinophils Relative: 6 %
HCT: 35.1 % — ABNORMAL LOW (ref 39.0–52.0)
Hemoglobin: 10.3 g/dL — ABNORMAL LOW (ref 13.0–17.0)
Immature Granulocytes: 1 %
Lymphocytes Relative: 17 %
Lymphs Abs: 1.2 10*3/uL (ref 0.7–4.0)
MCH: 23 pg — ABNORMAL LOW (ref 26.0–34.0)
MCHC: 29.3 g/dL — ABNORMAL LOW (ref 30.0–36.0)
MCV: 78.5 fL — ABNORMAL LOW (ref 80.0–100.0)
Monocytes Absolute: 0.7 10*3/uL (ref 0.1–1.0)
Monocytes Relative: 10 %
Neutro Abs: 4.7 10*3/uL (ref 1.7–7.7)
Neutrophils Relative %: 65 %
Platelet Count: 312 10*3/uL (ref 150–400)
RBC: 4.47 MIL/uL (ref 4.22–5.81)
RDW: 20.5 % — ABNORMAL HIGH (ref 11.5–15.5)
WBC Count: 7.1 10*3/uL (ref 4.0–10.5)
nRBC: 0 % (ref 0.0–0.2)

## 2020-05-16 LAB — IRON AND TIBC
Iron: 34 ug/dL — ABNORMAL LOW (ref 42–163)
Saturation Ratios: 8 % — ABNORMAL LOW (ref 20–55)
TIBC: 445 ug/dL — ABNORMAL HIGH (ref 202–409)
UIBC: 411 ug/dL — ABNORMAL HIGH (ref 117–376)

## 2020-05-16 NOTE — Telephone Encounter (Signed)
Appointments scheduled calendar mailed per 6/7 los

## 2020-05-16 NOTE — Progress Notes (Signed)
Hematology and Oncology Follow Up Visit  Alec Taylor [id number] 20-Jul-1946 74 y.o. 05/16/2020   Principle Diagnosis:  Microcytic anemia, likely due to hx of gastric bypass  Current Therapy: IV iron as indicated    Interim History:  Alec Taylor is here today for follow-up. He is doing well and has responded nciely to the IV iron he received.  Hgb is 10.3, MCV 78, platelets 312.   He states that he did have some chest pain that radiated into the left shoulder and arm while golfing last week. He has made an appointment with his cardiologist Dr. Olivia Taylor for this week on Friday. He is asymptomatic at this time.  No fever, chills, n/v, cough, rash, dizziness, palpitations, abdominal pain or changes in bowel or bladder habits.  No episodes of bleeding. He does bruise easily on Aspirin.  No falls or syncopal episodes to report.  No swelling or tenderness in his extremities at this time.  He has intermittent numbness and tingling in his fingertips and feet.  He has a good appetite and is staying well hydrated. His weight is stable.   ECOG Performance Status: 1 - Symptomatic but completely ambulatory  Medications:  Allergies as of 05/16/2020      Reactions   Celecoxib Other (See Comments)   hyperkalemia   Losartan Other (See Comments)   hyperkalemia   Ramipril Cough         Medication List       Accurate as of May 16, 2020  9:36 AM. If you have any questions, ask your nurse or doctor.        ALPRAZolam 0.25 MG tablet Commonly known as: XANAX TAKE ONE-HALF TO ONE TABLET BY MOUTH ONE TIME DAILY AS NEEDED FOR ANXIETY   aspirin EC 81 MG tablet Take 81 mg by mouth at bedtime.   atorvastatin 20 MG tablet Commonly known as: LIPITOR Take 1 tablet (20 mg total) by mouth daily.   carvedilol 6.25 MG tablet Commonly known as: COREG Take 0.5 tablets (3.125 mg total) by mouth 2 (two) times daily with a meal.   DULoxetine 60 MG capsule Commonly known as: CYMBALTA TAKE ONE CAPSULE BY  MOUTH ONE TIME DAILY   Entresto 49-51 MG Generic drug: sacubitril-valsartan Take 1 tablet by mouth 2 (two) times daily.   finasteride 5 MG tablet Commonly known as: PROSCAR Take 5 mg by mouth daily.   folic acid 1 MG tablet Commonly known as: FOLVITE Take 1 mg by mouth daily.   furosemide 20 MG tablet Commonly known as: Lasix Take 1 tablet (20 mg total) by mouth every Monday, Wednesday, and Friday.   glimepiride 2 MG tablet Commonly known as: AMARYL Take 2 mg by mouth every evening.   Jardiance 10 MG Tabs tablet Generic drug: empagliflozin TAKE 1 TABLET BY MOUTH EVERY DAY   metFORMIN 500 MG 24 hr tablet Commonly known as: GLUCOPHAGE-XR Take 500 mg by mouth 2 (two) times daily.   methotrexate 2.5 MG tablet Commonly known as: RHEUMATREX Take 12.5 mg by mouth once a week. On Wednesday   MULTIVITAMIN ADULT PO Take 1 tablet by mouth daily. Centrum silver   ofloxacin 0.3 % ophthalmic solution Commonly known as: OCUFLOX Place 1 drop into both eyes as needed (Before procedure eye injections). Prior to procedures.   pantoprazole 40 MG tablet Commonly known as: PROTONIX Take 40 mg by mouth daily.   potassium chloride SA 20 MEQ tablet Commonly known as: KLOR-CON Take 1 tablet (20 mEq total) by mouth 3 (  three) times a week. Take 1 tablet ( 20 meq ) by mouth with Furosemide ( Lasix ) 3 Times A Week. What changed:   when to take this  additional instructions   spironolactone 25 MG tablet Commonly known as: ALDACTONE Take 0.5 tablets (12.5 mg total) by mouth daily.   triamcinolone cream 0.5 % Commonly known as: KENALOG Apply 1 application topically 2 (two) times daily. What changed:   when to take this  reasons to take this   Vitamin D3 125 MCG (5000 UT) Caps Take 5,000 Units by mouth daily.   zolpidem 12.5 MG CR tablet Commonly known as: AMBIEN CR TAKE ONE TABLET BY MOUTH ONE TIME DAILY AT BEDTIME AS NEEDED FOR SLEEP       Allergies:  Allergies    Allergen Reactions  . Celecoxib Other (See Comments)    hyperkalemia  . Losartan Other (See Comments)    hyperkalemia  . Ramipril Cough         Past Medical History, Surgical history, Social history, and Family History were reviewed and updated.  Review of Systems: All other 10 point review of systems is negative.   Physical Exam:  vitals were not taken for this visit.   Wt Readings from Last 3 Encounters:  02/22/20 185 lb (83.9 kg)  02/18/20 185 lb (83.9 kg)  02/15/20 186 lb 12.8 oz (84.7 kg)    Ocular: Sclerae unicteric, pupils equal, round and reactive to light Ear-nose-throat: Oropharynx clear, dentition fair Lymphatic: No cervical or supraclavicular adenopathy Lungs no rales or rhonchi, good excursion bilaterally Heart regular rate and rhythm, no murmur appreciated Abd soft, nontender, positive bowel sounds, no liver or spleen tip palpated on exam, no fluid wave  MSK no focal spinal tenderness, no joint edema Neuro: non-focal, well-oriented, appropriate affect Breasts: Deferred   Lab Results  Component Value Date   WBC 7.1 05/16/2020   HGB 10.3 (L) 05/16/2020   HCT 35.1 (L) 05/16/2020   MCV 78.5 (L) 05/16/2020   PLT 312 05/16/2020   Lab Results  Component Value Date   FERRITIN 20 (L) 02/15/2020   IRON 22 (L) 02/15/2020   TIBC 465 (H) 02/15/2020   UIBC 443 (H) 02/15/2020   IRONPCTSAT 5 (L) 02/15/2020   Lab Results  Component Value Date   RBC 4.47 05/16/2020   No results found for: KPAFRELGTCHN, LAMBDASER, KAPLAMBRATIO No results found for: IGGSERUM, IGA, IGMSERUM No results found for: Kathrynn Ducking, MSPIKE, SPEI   Chemistry      Component Value Date/Time   NA 146 (H) 02/18/2020 1046   NA 140 07/08/2018 0926   K 3.8 02/18/2020 1046   CL 103 02/15/2020 0838   CO2 31 02/15/2020 0838   BUN 33 (H) 02/15/2020 0838   BUN 27 07/08/2018 0926   CREATININE 0.95 02/15/2020 0838      Component Value Date/Time    CALCIUM 9.5 02/15/2020 0838   ALKPHOS 60 02/15/2020 0838   AST 21 02/15/2020 0838   ALT 26 02/15/2020 0838   BILITOT 0.4 02/15/2020 0838       Impression and Plan: Alec Taylor is a very pleasant 74 yo caucasian gentleman with history of iron deficiency anemia secondary to malabsorption after gastric bypass.  We will see what his iron studies look like and replace if needed.  We will plan to see him again in 3 months.  He will contact our office with any questions or concerns. We can certainly see him sooner if  needed.   Emeline Gins, NP 6/7/20219:36 AM

## 2020-05-17 DIAGNOSIS — H4312 Vitreous hemorrhage, left eye: Secondary | ICD-10-CM | POA: Diagnosis not present

## 2020-05-17 DIAGNOSIS — H35373 Puckering of macula, bilateral: Secondary | ICD-10-CM | POA: Diagnosis not present

## 2020-05-17 DIAGNOSIS — Z961 Presence of intraocular lens: Secondary | ICD-10-CM | POA: Diagnosis not present

## 2020-05-17 DIAGNOSIS — E113513 Type 2 diabetes mellitus with proliferative diabetic retinopathy with macular edema, bilateral: Secondary | ICD-10-CM | POA: Diagnosis not present

## 2020-05-20 ENCOUNTER — Encounter (HOSPITAL_COMMUNITY): Payer: Self-pay | Admitting: Internal Medicine

## 2020-05-20 ENCOUNTER — Other Ambulatory Visit: Payer: Self-pay

## 2020-05-20 ENCOUNTER — Ambulatory Visit (HOSPITAL_COMMUNITY)
Admission: RE | Admit: 2020-05-20 | Discharge: 2020-05-20 | Disposition: A | Discharge status: Home / Self Care | Payer: BC Managed Care – PPO | Source: Ambulatory Visit | Attending: Internal Medicine | Admitting: Internal Medicine

## 2020-05-20 ENCOUNTER — Encounter: Payer: Self-pay | Admitting: Family

## 2020-05-20 VITALS — BP 124/64 | HR 85 | Ht 70.0 in | Wt 190.0 lb

## 2020-05-20 DIAGNOSIS — D649 Anemia, unspecified: Secondary | ICD-10-CM | POA: Diagnosis not present

## 2020-05-20 DIAGNOSIS — Z888 Allergy status to other drugs, medicaments and biological substances status: Secondary | ICD-10-CM | POA: Diagnosis not present

## 2020-05-20 DIAGNOSIS — I2510 Atherosclerotic heart disease of native coronary artery without angina pectoris: Secondary | ICD-10-CM | POA: Diagnosis not present

## 2020-05-20 DIAGNOSIS — I5022 Chronic systolic (congestive) heart failure: Secondary | ICD-10-CM | POA: Insufficient documentation

## 2020-05-20 DIAGNOSIS — Z79899 Other long term (current) drug therapy: Secondary | ICD-10-CM | POA: Insufficient documentation

## 2020-05-20 DIAGNOSIS — Z955 Presence of coronary angioplasty implant and graft: Secondary | ICD-10-CM | POA: Diagnosis not present

## 2020-05-20 DIAGNOSIS — F329 Major depressive disorder, single episode, unspecified: Secondary | ICD-10-CM | POA: Insufficient documentation

## 2020-05-20 DIAGNOSIS — E119 Type 2 diabetes mellitus without complications: Secondary | ICD-10-CM | POA: Diagnosis not present

## 2020-05-20 DIAGNOSIS — I110 Hypertensive heart disease with heart failure: Secondary | ICD-10-CM | POA: Insufficient documentation

## 2020-05-20 DIAGNOSIS — Z9884 Bariatric surgery status: Secondary | ICD-10-CM | POA: Insufficient documentation

## 2020-05-20 DIAGNOSIS — J449 Chronic obstructive pulmonary disease, unspecified: Secondary | ICD-10-CM | POA: Diagnosis not present

## 2020-05-20 DIAGNOSIS — Z7984 Long term (current) use of oral hypoglycemic drugs: Secondary | ICD-10-CM | POA: Insufficient documentation

## 2020-05-20 DIAGNOSIS — I493 Ventricular premature depolarization: Secondary | ICD-10-CM | POA: Diagnosis not present

## 2020-05-20 DIAGNOSIS — Z951 Presence of aortocoronary bypass graft: Secondary | ICD-10-CM | POA: Insufficient documentation

## 2020-05-20 DIAGNOSIS — I255 Ischemic cardiomyopathy: Secondary | ICD-10-CM | POA: Diagnosis not present

## 2020-05-20 DIAGNOSIS — E785 Hyperlipidemia, unspecified: Secondary | ICD-10-CM | POA: Insufficient documentation

## 2020-05-20 DIAGNOSIS — I25119 Atherosclerotic heart disease of native coronary artery with unspecified angina pectoris: Secondary | ICD-10-CM | POA: Diagnosis not present

## 2020-05-20 DIAGNOSIS — D508 Other iron deficiency anemias: Secondary | ICD-10-CM

## 2020-05-20 DIAGNOSIS — E669 Obesity, unspecified: Secondary | ICD-10-CM | POA: Diagnosis not present

## 2020-05-20 DIAGNOSIS — Z7982 Long term (current) use of aspirin: Secondary | ICD-10-CM | POA: Diagnosis not present

## 2020-05-20 LAB — BASIC METABOLIC PANEL
Anion gap: 8 (ref 5–15)
BUN: 32 mg/dL — ABNORMAL HIGH (ref 8–23)
CO2: 26 mmol/L (ref 22–32)
Calcium: 9.1 mg/dL (ref 8.9–10.3)
Chloride: 103 mmol/L (ref 98–111)
Creatinine, Ser: 0.95 mg/dL (ref 0.61–1.24)
GFR calc Af Amer: >60 mL/min (ref >60)
GFR calc non Af Amer: >60 mL/min (ref >60)
Glucose, Bld: 147 mg/dL — ABNORMAL HIGH (ref 70–99)
Potassium: 5.3 mmol/L — ABNORMAL HIGH (ref 3.5–5.1)
Sodium: 137 mmol/L (ref 135–145)

## 2020-05-20 MED ORDER — CARVEDILOL 6.25 MG PO TABS: 6.2500 mg | ORAL_TABLET | Freq: Two times a day (BID) | ORAL | 6 refills | Status: DC

## 2020-05-20 NOTE — Addendum Note (Signed)
Encounter addended by: Noralee Space, RN on: 05/20/2020 2:40 PM  Actions taken: Order Reconciliation Section accessed, Diagnosis association updated, Pharmacy for encounter modified, Order list changed, Charge Capture section accepted, Clinical Note Signed

## 2020-05-20 NOTE — Patient Instructions (Signed)
Increase Carvedilol to 6.25 mg Twice daily   Labs done today, your results will be available in MyChart, we will contact you for abnormal readings.  Please follow up with your primary care provider about iron infusions  Please call our office in December to schedule your follow up appointment  If you have any questions or concerns before your next appointment please send [place] a message through [place] or call our office at [phone].    TO LEAVE A MESSAGE FOR THE NURSE SELECT OPTION 2, PLEASE LEAVE A MESSAGE INCLUDING: . YOUR NAME . DATE OF BIRTH . CALL BACK NUMBER . REASON FOR CALL**this is important as we prioritize the call backs  YOU WILL RECEIVE A CALL BACK THE SAME DAY AS LONG AS YOU CALL BEFORE 4:00 PM  At the [facility], you and your health needs are our priority. As part of our continuing mission to provide you with exceptional heart care, we have created designated Provider Care Teams. These Care Teams include your primary Cardiologist (physician) and Advanced Practice Providers (APPs- Physician Assistants and Nurse Practitioners) who all work together to provide you with the care you need, when you need it.   You may see any of the following providers on your designated Care Team at your next follow up: [name] Dr Arvilla Meres . Dr Marca Ancona . Tonye Becket, NP . Robbie Lis, PA . Karle Plumber, PharmD   Please be sure to bring in all your medications bottles to every appointment.

## 2020-05-20 NOTE — Progress Notes (Signed)
Pt unsure of Carvedilol dose, he felt he was cutting it in half, then changed to he only cuts the [place] in half so he thinks he take a whole tab of carvedilol 6.25 mg unsure if 1 or 2 times a day. I called his [facility], they state the orders they have says take 6.25 mg Daily with a quantity of 30, pt last picked that up on 5/21. Discussed w/pt, he now states that sounds like what he is doing and if that's what the bottle says he goes by that. Dr [name] aware and advises to increase dose to 6.25 mg BID, new rx sent in, explained to pt.

## 2020-05-20 NOTE — Progress Notes (Signed)
[facility] Note   Referring Physician: Dr Excell Seltzer Primary Care: Dr Derrell Lolling Primary [facility] Cardiologist: Dr Beverely Pace   HPI: Alec Taylor is a 74 y.o. male with history of CAD s/p CABG 2004 at [facility], HTN, hyperlipidemia, DMII, obesity s/p gastric bypass and systolic HF.   Echocardiogram 12/2017  revealed LVEF of 45 to 50%.   Admitted 04/28/18 with increased dyspnea. CTA negative for PE. ECHO performed and showed reduced EF 35-40%. He underwent LHC . Stable coronary disease and grafts. Diuresed well. Discharge weight was 177 pounds.   I saw him in March and was not feeling well despite med titration and fluid control. Underwent R/L cath with stable anatomy and no targets for PCI. Here for f/u. Says he is feeling better. Back playing golf. Was playing in the heat and developed heat exhaustion with CP and muscles cramps. Otherwise feels ok. No edema, orthopnea or PND.   Cath 3/21  1. Severe native 3v CAD with patent grafts to LAD and dRCA 2. EF 45-50%  Ao = 115/51 (76) LV = 107/17 RA = 6 RV = 45/6 PA =  51/20 (33) PCW = 19 (v wave 28) Fick cardiac output/index = 8.0/4.0 PVR = 1.4 WU FA sat = 99% PA sat = 69%, 74% SVC sat = 72%   Echo 3/21 . LV dilated EF 40-45% RV ok. Personally reviewed  Echo 1/20 EF 40-45%   CMRI 8/14  1. Normal left ventricular size with mild concentric hypertrophy and severely decreased systolic function (LVEF = 38%) with diffuse hypokinesis. There is pericardial late gadolinium enhancement in the basal and mid inferior walls. 2. Normal right ventricular size, thickness and borderline systolic function (LVEF = 45%). There are no regional wall motion abnormalities. 3.  Moderately dilated left atrium, mildly dilated right atrium. 4. Normal size of the aortic root, ascending aorta. Mildly dilated pulmonary artery measuring 31 mm. 5.  Mild aortic, mitral and trivial tricuspid regurgitation. 6.  Normal pericardium.  No pericardial  effusion  ECHO 04/29/2018  Left ventricle: The cavity size was normal. Wall thickness was   normal. Systolic function was moderately reduced. The estimated   ejection fraction was in the range of 35% to 40%. Diffuse   hypokinesis. The study is not technically sufficient to allow   evaluation of LV diastolic function. - Aortic valve: There was mild regurgitation. - Mitral valve: Severely calcified annulus. There was mild   regurgitation. - Left atrium: The atrium was mildly dilated. - Tricuspid valve: There was trivial regurgitation  LHC 04/30/2018   Prox RCA to Mid RCA lesion is 65% stenosed.  Dist RCA-1 lesion is 85% stenosed. Dist RCA-2 lesion is 95% stenosed with 80% stenosed side branch in Post Atrio.  SVG-rPDA graft was visualized by angiography and is normal in caliber and large. The graft exhibits no disease.  Ost RPDA lesion is 45% stenosed. Ost RPDA to prox RPDA lesion is 90% stenosed - This limits flow retrograde to the RPAV-RPL.  Ost LAD lesion is 55% stenosed.  Ost 1st Diag (small caliber vessel) lesion is 70% stenosed.  Prox LAD lesion is 100% stenosed.  LIMA-LAD graft is large caliber, widely patent perfusing a small caliber distal LAD  Prox Cx to Mid Cx stent is 25% stenosed.  Dist Cx lesion is 65% stenosed. Ost 3rd Mrg to 3rd Mrg lesion is 70% stenosed. -Both limbs of the distal bifurcation.  There is severe left ventricular systolic dysfunction. The left ventricular ejection fraction is less than 25%  by visual estimate.  LV end diastolic pressure is moderately elevated.  Significant aortic and mitral valve calcification.  Calcified pericardium  Review of systems complete and found to be negative unless listed in HPI.   Past Medical History:  Diagnosis Date   CAD (coronary artery disease) 09/2007   s/p CABG approximately 2004-[facility] (Cath - 17 Sep 2007:90% prox LAD with diffuse 85% narrowing to distal segment, 80% prox CXA, RCA: prox 90%, prox-mid 70%, mid  85%, distal 80%, D1 80%; Normal EF 65% --status post CABG--LIMA to LAD, SVG to PDA.     COPD (chronic obstructive pulmonary disease) (HCC)    Depression    Diabetes mellitus without complication (HCC)    Hypertension    Ischemic cardiomyopathy    Pleural effusion    S/P CABG x 2    CABG--LIMA to LAD, SVG to PDA.     Current Outpatient Medications  Medication Sig Dispense Refill   ALPRAZolam (XANAX) 0.25 MG tablet TAKE ONE-HALF TO ONE TABLET BY MOUTH ONE TIME DAILY AS NEEDED FOR ANXIETY 30 tablet 1   aspirin EC 81 MG tablet Take 81 mg by mouth at bedtime.      atorvastatin (LIPITOR) 20 MG tablet Take 1 tablet (20 mg total) by mouth daily. 90 tablet 3   carvedilol (COREG) 6.25 MG tablet Take 0.5 tablets (3.125 mg total) by mouth 2 (two) times daily with a meal. 60 tablet 6   Cholecalciferol (VITAMIN D3) 5000 units CAPS Take 5,000 Units by mouth daily.      DULoxetine (CYMBALTA) 60 MG capsule TAKE ONE CAPSULE BY MOUTH ONE TIME DAILY 90 capsule 0   finasteride (PROSCAR) 5 MG tablet Take 5 mg by mouth daily.   6   folic acid (FOLVITE) 1 MG tablet Take 1 mg by mouth daily.  2   furosemide (LASIX) 20 MG tablet Take 1 tablet (20 mg total) by mouth every Monday, Wednesday, and Friday. 39 tablet 3   glimepiride (AMARYL) 2 MG tablet Take 2 mg by mouth every evening.  1   JARDIANCE 10 MG TABS tablet TAKE 1 TABLET BY MOUTH EVERY DAY 30 tablet 6   metFORMIN (GLUCOPHAGE-XR) 500 MG 24 hr tablet Take 500 mg by mouth 2 (two) times daily.  1   methotrexate (RHEUMATREX) 2.5 MG tablet Take 12.5 mg by mouth once a week. On Wednesday  3   Multiple Vitamins-Minerals (MULTIVITAMIN ADULT PO) Take 1 tablet by mouth daily. Centrum silver     ofloxacin (OCUFLOX) 0.3 % ophthalmic solution Place 1 drop into both eyes as needed (Before procedure eye injections). Prior to procedures.     pantoprazole (PROTONIX) 40 MG tablet Take 40 mg by mouth daily.   0   potassium chloride SA (KLOR-CON) 20 MEQ  tablet Take 1 tablet (20 mEq total) by mouth 3 (three) times a week. Take 1 tablet ( 20 meq ) by mouth with Furosemide ( Lasix ) 3 Times A Week. 12 tablet 5   sacubitril-valsartan (ENTRESTO) 49-51 MG Take 1 tablet by mouth 2 (two) times daily. 180 tablet 3   spironolactone (ALDACTONE) 25 MG tablet Take 0.5 tablets (12.5 mg total) by mouth daily. 45 tablet 3   triamcinolone cream (KENALOG) 0.5 % Apply 1 application topically 2 (two) times daily. 30 g 0   zolpidem (AMBIEN CR) 12.5 MG CR tablet TAKE ONE TABLET BY MOUTH ONE TIME DAILY AT BEDTIME AS NEEDED FOR SLEEP 90 tablet 1   No current facility-administered medications for this encounter.  Allergies  Allergen Reactions   Celecoxib Other (See Comments)    hyperkalemia   Losartan Other (See Comments)    hyperkalemia   Ramipril Cough        Social History   Socioeconomic History   Marital status: Legally Separated    Spouse name: Not on file   Number of children: Not on file   Years of education: Not on file   Highest education level: Not on file  Occupational History   Not on file  Tobacco Use   Smoking status: Never Smoker   Smokeless tobacco: Never Used  Vaping Use   Vaping Use: Never used  Substance and Sexual Activity   Alcohol use: Yes    Alcohol/week: 2.0 standard drinks    Types: 2 Cans of beer per week    Comment: occ   Drug use: Never   Sexual activity: Not on file  Other Topics Concern   Not on file  Social History Narrative   Not on file   Social Determinants of Health   Financial Resource Strain:    Difficulty of Paying Living Expenses:   Food Insecurity:    Worried About Programme researcher, broadcasting/film/video in the Last Year:    Barista in the Last Year:   Transportation Needs:    Freight forwarder (Medical):    Lack of Transportation (Non-Medical):   Physical Activity:    Days of Exercise per Week:    Minutes of Exercise per Session:   Stress:    Feeling of Stress :    Social Connections:    Frequency of Communication with Friends and Family:    Frequency of Social Gatherings with Friends and Family:    Attends Religious Services:    Active Member of Clubs or Organizations:    Attends Banker Meetings:    Marital Status:   Intimate Partner Violence:    Fear of Current or Ex-Partner:    Emotionally Abused:    Physically Abused:    Sexually Abused:     Family History  Problem Relation Age of Onset   Hypertension Father    Vitals:   05/20/20 1342  BP: 124/64  Pulse: 85  SpO2: 96%  Weight: 86.2 kg (190 lb)  Height: 5\' 10"  (1.778 m)     Wt Readings from Last 3 Encounters:  05/20/20 86.2 kg (190 lb)  05/16/20 84.8 kg (187 lb)  02/22/20 83.9 kg (185 lb)     PHYSICAL EXAM: General:  Well appearing. No resp difficulty HEENT: normal Neck: supple. no JVD. Carotids 2+ bilat; no bruits. No lymphadenopathy or thryomegaly appreciated. Cor: PMI nondisplaced. Regular rate & rhythm. No rubs, gallops or murmurs. Lungs: clear Abdomen: soft, nontender, nondistended. No hepatosplenomegaly. No bruits or masses. Good bowel sounds. Extremities: no cyanosis, clubbing, rash, edema Neuro: alert & orientedx3, cranial nerves grossly intact. moves all 4 extremities w/o difficulty. Affect pleasant    ASSESSMENT & PLAN:  1. Chronic Systolic Heart Failure due to ICM - EF 45-50% in January 2019  - ECHO 04/2018 EF 35-40%. ?Viral versus PVC - CMRI 07/2018 EF 38% pericardial late gadolinium enhancement in the basal and mid inferior walls. Suspicious for recent myocarditis.  - Echo 1/20 EF 40-45% - Echo 3/21 EF 40-45% - Cath 3/21: Underwent R/L cath with stable anatomy and no targets for PCI. - Improved NYHA II-III - Increase carvedilol to 9.375 mg BID - Continue Entresto 49/51 mg BID.  - Continue Jardiance 10  -  Continue spironolactone 12.5 mg daily.  - Continue lasix 20mg  MWF  - K 5.4 with last recheck. Will recheck  2. CAD S/P CABG   - LHC 04/30/2018 severe multivessel disease and patent LIMA-LAD SVG-PDA - Occasional angina (stable) - Cath 3/21 stable anatomy. No PCI targets - Continue statin, BB, and ASA. Increase carvedilol 9.375 bid  3. Anemia - secondary to gastric bypass surgery - Iron stores low 05/28/18. Has received feraheme - he has recurrent iron deficiency. Followed by PCP and GI   4. DMII  - Follows with Dr. Alanson Aly (Endo) - Continue Jardiance  5. PVCs - No palpitations currently. Regular on exam    Glori Bickers, MD  1:54 PM

## 2020-05-20 NOTE — Addendum Note (Signed)
Encounter addended by: Noralee Space, RN on: 05/20/2020 2:28 PM  Actions taken: Order Reconciliation Section accessed

## 2020-05-23 ENCOUNTER — Telehealth (HOSPITAL_COMMUNITY): Payer: Self-pay | Admitting: *Deleted

## 2020-05-23 DIAGNOSIS — R202 Paresthesia of skin: Secondary | ICD-10-CM | POA: Diagnosis not present

## 2020-05-23 DIAGNOSIS — R200 Anesthesia of skin: Secondary | ICD-10-CM | POA: Diagnosis not present

## 2020-05-23 DIAGNOSIS — M2042 Other hammer toe(s) (acquired), left foot: Secondary | ICD-10-CM | POA: Diagnosis not present

## 2020-05-23 DIAGNOSIS — Z89422 Acquired absence of other left toe(s): Secondary | ICD-10-CM | POA: Diagnosis not present

## 2020-05-23 DIAGNOSIS — I5022 Chronic systolic (congestive) heart failure: Secondary | ICD-10-CM

## 2020-05-23 NOTE — Telephone Encounter (Signed)
Pt aware, agreeable and verbalizes understanding. He request labs be done at [facility], order placed.

## 2020-05-23 NOTE — Telephone Encounter (Signed)
-----   Message from Dolores Patty, MD sent at 05/22/2020  9:44 PM EDT ----- Stop spiro. Repeat bmet 1 week

## 2020-05-27 DIAGNOSIS — L639 Alopecia areata, unspecified: Secondary | ICD-10-CM | POA: Diagnosis not present

## 2020-05-27 DIAGNOSIS — L661 Lichen planopilaris: Secondary | ICD-10-CM | POA: Diagnosis not present

## 2020-05-27 DIAGNOSIS — L089 Local infection of the skin and subcutaneous tissue, unspecified: Secondary | ICD-10-CM | POA: Diagnosis not present

## 2020-05-27 DIAGNOSIS — Z79899 Other long term (current) drug therapy: Secondary | ICD-10-CM | POA: Diagnosis not present

## 2020-05-27 DIAGNOSIS — L649 Androgenic alopecia, unspecified: Secondary | ICD-10-CM | POA: Diagnosis not present

## 2020-05-27 DIAGNOSIS — L308 Other specified dermatitis: Secondary | ICD-10-CM | POA: Diagnosis not present

## 2020-05-27 DIAGNOSIS — Z5181 Encounter for therapeutic drug level monitoring: Secondary | ICD-10-CM | POA: Diagnosis not present

## 2020-05-31 ENCOUNTER — Other Ambulatory Visit: Payer: Self-pay

## 2020-05-31 ENCOUNTER — Inpatient Hospital Stay: Payer: BC Managed Care – PPO

## 2020-05-31 DIAGNOSIS — Z9884 Bariatric surgery status: Secondary | ICD-10-CM | POA: Diagnosis not present

## 2020-05-31 DIAGNOSIS — R202 Paresthesia of skin: Secondary | ICD-10-CM | POA: Diagnosis not present

## 2020-05-31 DIAGNOSIS — Z79899 Other long term (current) drug therapy: Secondary | ICD-10-CM | POA: Diagnosis not present

## 2020-05-31 DIAGNOSIS — R200 Anesthesia of skin: Secondary | ICD-10-CM | POA: Diagnosis not present

## 2020-05-31 DIAGNOSIS — D509 Iron deficiency anemia, unspecified: Secondary | ICD-10-CM

## 2020-05-31 MED ORDER — SODIUM CHLORIDE 0.9 % IV SOLN
Freq: Once | INTRAVENOUS | Status: AC
  Filled 2020-05-31: qty 250

## 2020-05-31 MED ORDER — SODIUM CHLORIDE 0.9 % IV SOLN
200.0000 mg | Freq: Once | INTRAVENOUS | Status: AC
  Administered 2020-05-31: 200 mg via INTRAVENOUS
  Filled 2020-05-31: qty 200

## 2020-05-31 NOTE — Patient Instructions (Signed)

## 2020-06-06 ENCOUNTER — Inpatient Hospital Stay: Payer: BC Managed Care – PPO

## 2020-06-06 ENCOUNTER — Inpatient Hospital Stay: Payer: BC Managed Care – PPO | Admitting: Family

## 2020-06-06 ENCOUNTER — Other Ambulatory Visit: Payer: Self-pay

## 2020-06-06 VITALS — BP 120/51 | HR 68 | Temp 96.9°F | Resp 17

## 2020-06-06 DIAGNOSIS — Z79899 Other long term (current) drug therapy: Secondary | ICD-10-CM | POA: Diagnosis not present

## 2020-06-06 DIAGNOSIS — D509 Iron deficiency anemia, unspecified: Secondary | ICD-10-CM | POA: Diagnosis not present

## 2020-06-06 DIAGNOSIS — R202 Paresthesia of skin: Secondary | ICD-10-CM | POA: Diagnosis not present

## 2020-06-06 DIAGNOSIS — R200 Anesthesia of skin: Secondary | ICD-10-CM | POA: Diagnosis not present

## 2020-06-06 DIAGNOSIS — Z9884 Bariatric surgery status: Secondary | ICD-10-CM | POA: Diagnosis not present

## 2020-06-06 MED ORDER — SODIUM CHLORIDE 0.9 % IV SOLN
200.0000 mg | Freq: Once | INTRAVENOUS | Status: AC
  Administered 2020-06-06: 200 mg via INTRAVENOUS
  Filled 2020-06-06: qty 200

## 2020-06-06 MED ORDER — SODIUM CHLORIDE 0.9 % IV SOLN
Freq: Once | INTRAVENOUS | Status: AC
  Filled 2020-06-06: qty 250

## 2020-06-06 NOTE — Patient Instructions (Signed)

## 2020-06-06 NOTE — Progress Notes (Signed)
Pt declined to stay for post infusion observation period. Pt stated he has tolerated medication multiple times prior without difficulty. Pt aware to call clinic with any questions or concerns. Pt verbalized understanding and had no further questions.   

## 2020-06-10 ENCOUNTER — Ambulatory Visit: Payer: BC Managed Care – PPO

## 2020-06-14 ENCOUNTER — Other Ambulatory Visit: Payer: Self-pay

## 2020-06-14 ENCOUNTER — Inpatient Hospital Stay: Payer: BC Managed Care – PPO | Attending: Hematology & Oncology

## 2020-06-14 VITALS — BP 113/44 | HR 71 | Temp 98.7°F | Resp 18 | Ht 70.0 in | Wt 190.0 lb

## 2020-06-14 DIAGNOSIS — D509 Iron deficiency anemia, unspecified: Secondary | ICD-10-CM | POA: Diagnosis not present

## 2020-06-14 MED ORDER — SODIUM CHLORIDE 0.9 % IV SOLN
Freq: Once | INTRAVENOUS | Status: AC
  Filled 2020-06-14: qty 250

## 2020-06-14 MED ORDER — SODIUM CHLORIDE 0.9 % IV SOLN
200.0000 mg | Freq: Once | INTRAVENOUS | Status: AC
  Administered 2020-06-14: 200 mg via INTRAVENOUS
  Filled 2020-06-14: qty 200

## 2020-06-14 NOTE — Patient Instructions (Signed)

## 2020-06-17 ENCOUNTER — Inpatient Hospital Stay: Payer: BC Managed Care – PPO

## 2020-06-21 ENCOUNTER — Inpatient Hospital Stay: Payer: BC Managed Care – PPO

## 2020-06-21 ENCOUNTER — Other Ambulatory Visit: Payer: Self-pay

## 2020-06-21 VITALS — BP 119/59 | HR 72 | Temp 98.8°F | Resp 18

## 2020-06-21 DIAGNOSIS — D509 Iron deficiency anemia, unspecified: Secondary | ICD-10-CM | POA: Diagnosis not present

## 2020-06-21 MED ORDER — SODIUM CHLORIDE 0.9 % IV SOLN
200.0000 mg | Freq: Once | INTRAVENOUS | Status: AC
  Administered 2020-06-21: 200 mg via INTRAVENOUS
  Filled 2020-06-21: qty 200

## 2020-06-21 NOTE — Patient Instructions (Signed)

## 2020-07-11 DIAGNOSIS — R809 Proteinuria, unspecified: Secondary | ICD-10-CM | POA: Diagnosis not present

## 2020-07-11 DIAGNOSIS — I10 Essential (primary) hypertension: Secondary | ICD-10-CM | POA: Diagnosis not present

## 2020-07-11 DIAGNOSIS — E119 Type 2 diabetes mellitus without complications: Secondary | ICD-10-CM | POA: Diagnosis not present

## 2020-07-11 DIAGNOSIS — I739 Peripheral vascular disease, unspecified: Secondary | ICD-10-CM | POA: Diagnosis not present

## 2020-07-19 DIAGNOSIS — E113513 Type 2 diabetes mellitus with proliferative diabetic retinopathy with macular edema, bilateral: Secondary | ICD-10-CM | POA: Diagnosis not present

## 2020-07-19 DIAGNOSIS — H35373 Puckering of macula, bilateral: Secondary | ICD-10-CM | POA: Diagnosis not present

## 2020-07-19 DIAGNOSIS — H4312 Vitreous hemorrhage, left eye: Secondary | ICD-10-CM | POA: Diagnosis not present

## 2020-07-19 DIAGNOSIS — Z961 Presence of intraocular lens: Secondary | ICD-10-CM | POA: Diagnosis not present

## 2020-07-26 ENCOUNTER — Other Ambulatory Visit: Payer: Self-pay | Admitting: Psychiatry

## 2020-07-31 ENCOUNTER — Other Ambulatory Visit: Payer: Self-pay | Admitting: Psychiatry

## 2020-07-31 DIAGNOSIS — F411 Generalized anxiety disorder: Secondary | ICD-10-CM

## 2020-07-31 DIAGNOSIS — F3341 Major depressive disorder, recurrent, in partial remission: Secondary | ICD-10-CM

## 2020-08-01 NOTE — Telephone Encounter (Signed)
Last apt 10/2019 

## 2020-08-07 ENCOUNTER — Other Ambulatory Visit: Payer: Self-pay | Admitting: Family Medicine

## 2020-08-16 ENCOUNTER — Other Ambulatory Visit: Payer: BC Managed Care – PPO

## 2020-08-16 ENCOUNTER — Inpatient Hospital Stay: Payer: BC Managed Care – PPO | Attending: Hematology & Oncology

## 2020-08-16 ENCOUNTER — Inpatient Hospital Stay (HOSPITAL_BASED_OUTPATIENT_CLINIC_OR_DEPARTMENT_OTHER): Payer: BC Managed Care – PPO | Admitting: Family

## 2020-08-16 ENCOUNTER — Encounter: Payer: Self-pay | Admitting: Family

## 2020-08-16 ENCOUNTER — Other Ambulatory Visit: Payer: Self-pay

## 2020-08-16 ENCOUNTER — Telehealth: Payer: Self-pay | Admitting: Family

## 2020-08-16 ENCOUNTER — Ambulatory Visit: Payer: BC Managed Care – PPO | Admitting: Family

## 2020-08-16 VITALS — BP 122/37 | HR 85 | Temp 98.2°F | Resp 18 | Ht 70.0 in | Wt 197.1 lb

## 2020-08-16 DIAGNOSIS — K909 Intestinal malabsorption, unspecified: Secondary | ICD-10-CM | POA: Diagnosis not present

## 2020-08-16 DIAGNOSIS — D508 Other iron deficiency anemias: Secondary | ICD-10-CM

## 2020-08-16 DIAGNOSIS — D509 Iron deficiency anemia, unspecified: Secondary | ICD-10-CM | POA: Diagnosis not present

## 2020-08-16 DIAGNOSIS — Z9884 Bariatric surgery status: Secondary | ICD-10-CM | POA: Insufficient documentation

## 2020-08-16 LAB — CBC WITH DIFFERENTIAL (CANCER CENTER ONLY)
Abs Immature Granulocytes: 0.03 10*3/uL (ref 0.00–0.07)
Basophils Absolute: 0.1 10*3/uL (ref 0.0–0.1)
Basophils Relative: 1 %
Eosinophils Absolute: 0.2 10*3/uL (ref 0.0–0.5)
Eosinophils Relative: 2 %
HCT: 42.8 % (ref 39.0–52.0)
Hemoglobin: 13.5 g/dL (ref 13.0–17.0)
Immature Granulocytes: 0 %
Lymphocytes Relative: 11 %
Lymphs Abs: 1 10*3/uL (ref 0.7–4.0)
MCH: 27.6 pg (ref 26.0–34.0)
MCHC: 31.5 g/dL (ref 30.0–36.0)
MCV: 87.3 fL (ref 80.0–100.0)
Monocytes Absolute: 0.7 10*3/uL (ref 0.1–1.0)
Monocytes Relative: 7 %
Neutro Abs: 7.5 10*3/uL (ref 1.7–7.7)
Neutrophils Relative %: 79 %
Platelet Count: 192 10*3/uL (ref 150–400)
RBC: 4.9 MIL/uL (ref 4.22–5.81)
RDW: 16.6 % — ABNORMAL HIGH (ref 11.5–15.5)
WBC Count: 9.5 10*3/uL (ref 4.0–10.5)
nRBC: 0 % (ref 0.0–0.2)

## 2020-08-16 LAB — CMP (CANCER CENTER ONLY)
ALT: 50 U/L — ABNORMAL HIGH (ref 0–44)
AST: 34 U/L (ref 15–41)
Albumin: 4.5 g/dL (ref 3.5–5.0)
Alkaline Phosphatase: 79 U/L (ref 38–126)
Anion gap: 7 (ref 5–15)
BUN: 33 mg/dL — ABNORMAL HIGH (ref 8–23)
CO2: 29 mmol/L (ref 22–32)
Calcium: 9.9 mg/dL (ref 8.9–10.3)
Chloride: 103 mmol/L (ref 98–111)
Creatinine: 0.92 mg/dL (ref 0.61–1.24)
GFR, Est AFR Am: >60 mL/min (ref >60)
GFR, Estimated: >60 mL/min (ref >60)
Glucose, Bld: 158 mg/dL — ABNORMAL HIGH (ref 70–99)
Potassium: 4.8 mmol/L (ref 3.5–5.1)
Sodium: 139 mmol/L (ref 135–145)
Total Bilirubin: 0.6 mg/dL (ref 0.3–1.2)
Total Protein: 7.4 g/dL (ref 6.5–8.1)

## 2020-08-16 LAB — RETICULOCYTES
Immature Retic Fract: 13.2 % (ref 2.3–15.9)
RBC.: 4.94 MIL/uL (ref 4.22–5.81)
Retic Count, Absolute: 93.9 10*3/uL (ref 19.0–186.0)
Retic Ct Pct: 1.9 % (ref 0.4–3.1)

## 2020-08-16 NOTE — Telephone Encounter (Signed)
Appointments scheduled calendar printed per 9/7 los 

## 2020-08-16 NOTE — Progress Notes (Signed)
Hematology and Oncology Follow Up Visit  Alec Taylor [id number] October 08, 1946 74 y.o. 08/16/2020   Principle Diagnosis:  Microcytic anemia, likely due to hx of gastric bypass  Current Therapy: IV iron as indicated    Interim History:  Alec Taylor is here today for follow-up. He is doing well and is feeling much better since receiving IV iron. Hgb is now up to 13.2, MCV 91.  He denies fatigue. He takes Ambien at bedtime and sleeps well.  No fever, chills, n/v, cough, rash, dizziness, SOB, chest pain, palpitations, abdominal pain or changes in bowel or bladder habits.  No hot flashes or night sweats.  No episodes of bleeding. No bruising or petechiae.  No swelling, tenderness, numbness or tingling in his extremities.  No falls or syncope.  He has maintained a good appetite and is staying well hydrated. His weight is stable.   ECOG Performance Status: 0 - Asymptomatic  Medications:  Allergies as of 08/16/2020      Reactions   Celecoxib Other (See Comments)   hyperkalemia   Losartan Other (See Comments)   hyperkalemia   Ramipril Cough         Medication List       Accurate as of August 16, 2020 11:00 AM. If you have any questions, ask your nurse or doctor.        ALPRAZolam 0.25 MG tablet Commonly known as: XANAX TAKE ONE-HALF TO ONE TABLET BY MOUTH ONE TIME DAILY AS NEEDED FOR ANXIETY   aspirin EC 81 MG tablet Take 81 mg by mouth at bedtime.   atorvastatin 20 MG tablet Commonly known as: LIPITOR TAKE 1 TABLET BY MOUTH EVERY DAY   carvedilol 6.25 MG tablet Commonly known as: COREG Take 1 tablet (6.25 mg total) by mouth 2 (two) times daily with a meal.   DULoxetine 60 MG capsule Commonly known as: CYMBALTA TAKE ONE CAPSULE BY MOUTH ONE TIME DAILY   Entresto 49-51 MG Generic drug: sacubitril-valsartan Take 1 tablet by mouth 2 (two) times daily.   finasteride 5 MG tablet Commonly known as: PROSCAR Take 5 mg by mouth daily.   folic acid 1 MG tablet Commonly  known as: FOLVITE Take 1 mg by mouth daily.   furosemide 20 MG tablet Commonly known as: Lasix Take 1 tablet (20 mg total) by mouth every Monday, Wednesday, and Friday.   glimepiride 2 MG tablet Commonly known as: AMARYL Take 2 mg by mouth every evening.   Jardiance 10 MG Tabs tablet Generic drug: empagliflozin TAKE 1 TABLET BY MOUTH EVERY DAY   metFORMIN 500 MG 24 hr tablet Commonly known as: GLUCOPHAGE-XR Take 500 mg by mouth 2 (two) times daily.   methotrexate 2.5 MG tablet Commonly known as: RHEUMATREX Take 12.5 mg by mouth once a week. On Wednesday   MULTIVITAMIN ADULT PO Take 1 tablet by mouth daily. Centrum silver   ofloxacin 0.3 % ophthalmic solution Commonly known as: OCUFLOX Place 1 drop into both eyes as needed (Before procedure eye injections). Prior to procedures.   pantoprazole 40 MG tablet Commonly known as: PROTONIX Take 40 mg by mouth daily.   potassium chloride SA 20 MEQ tablet Commonly known as: KLOR-CON Take 1 tablet (20 mEq total) by mouth 3 (three) times a week. Take 1 tablet ( 20 meq ) by mouth with Furosemide ( Lasix ) 3 Times A Week.   spironolactone 25 MG tablet Commonly known as: ALDACTONE Take 0.5 tablets (12.5 mg total) by mouth daily.   triamcinolone cream 0.5 %  Commonly known as: KENALOG Apply 1 application topically 2 (two) times daily.   Vitamin D3 125 MCG (5000 UT) Caps Take 5,000 Units by mouth daily.   zolpidem 12.5 MG CR tablet Commonly known as: AMBIEN CR TAKE ONE TABLET BY MOUTH ONE TIME DAILY AT BEDTIME AS NEEDED FOR SLEEP       Allergies:  Allergies  Allergen Reactions  . Celecoxib Other (See Comments)    hyperkalemia  . Losartan Other (See Comments)    hyperkalemia  . Ramipril Cough         Past Medical History, Surgical history, Social history, and Family History were reviewed and updated.  Review of Systems: All other 10 point review of systems is negative.   Physical Exam:  height is 5\' 10"  (1.778  m) and weight is 197 lb 1.9 oz (89.4 kg). His oral temperature is 98.2 F (36.8 C). His blood pressure is 122/37 (abnormal) and his pulse is 85. His respiration is 18 and oxygen saturation is 100%.   Wt Readings from Last 3 Encounters:  08/16/20 197 lb 1.9 oz (89.4 kg)  06/14/20 190 lb (86.2 kg)  05/20/20 190 lb (86.2 kg)    Ocular: Sclerae unicteric, pupils equal, round and reactive to light Ear-nose-throat: Oropharynx clear, dentition fair Lymphatic: No cervical or supraclavicular adenopathy Lungs no rales or rhonchi, good excursion bilaterally Heart regular rate and rhythm, no murmur appreciated Abd soft, nontender, positive bowel sounds MSK no focal spinal tenderness, no joint edema Neuro: non-focal, well-oriented, appropriate affect Breasts: Deferred   Lab Results  Component Value Date   WBC 9.5 08/16/2020   HGB 13.5 08/16/2020   HCT 42.8 08/16/2020   MCV 87.3 08/16/2020   PLT 192 08/16/2020   Lab Results  Component Value Date   FERRITIN 30 05/16/2020   IRON 34 (L) 05/16/2020   TIBC 445 (H) 05/16/2020   UIBC 411 (H) 05/16/2020   IRONPCTSAT 8 (L) 05/16/2020   Lab Results  Component Value Date   RETICCTPCT 1.9 08/16/2020   RBC 4.90 08/16/2020   No results found for: KPAFRELGTCHN, LAMBDASER, KAPLAMBRATIO No results found for: IGGSERUM, IGA, IGMSERUM No results found for: 10/16/2020, SPEI   Chemistry      Component Value Date/Time   NA 139 08/16/2020 1019   NA 140 07/08/2018 0926   K 4.8 08/16/2020 1019   CL 103 08/16/2020 1019   CO2 29 08/16/2020 1019   BUN 33 (H) 08/16/2020 1019   BUN 27 07/08/2018 0926   CREATININE 0.92 08/16/2020 1019      Component Value Date/Time   CALCIUM 9.9 08/16/2020 1019   ALKPHOS 79 08/16/2020 1019   AST 34 08/16/2020 1019   ALT 50 (H) 08/16/2020 1019   BILITOT 0.6 08/16/2020 1019       Impression and Plan: [name] is a very pleasant 74 yo caucasian gentleman with  history of iron deficiency anemia secondary to malabsorption after gastric bypass.  Iron studies are pending. We will replace if needed.  We will see him again in another 3 months.  He can contact our office with any questions or concerns.   61, NP 9/7/202111:00 AM

## 2020-08-17 ENCOUNTER — Telehealth: Payer: Self-pay | Admitting: Family

## 2020-08-17 LAB — IRON AND TIBC
Iron: 71 ug/dL (ref 42–163)
Saturation Ratios: 17 % — ABNORMAL LOW (ref 20–55)
TIBC: 414 ug/dL — ABNORMAL HIGH (ref 202–409)
UIBC: 342 ug/dL (ref 117–376)

## 2020-08-17 LAB — FERRITIN: Ferritin: 46 ng/mL (ref 24–336)

## 2020-08-17 NOTE — Telephone Encounter (Signed)
I called and reviewed appointments with patient.  He was on with dates.  He will double check when he gets home with My Chart to be sure he does not have any conflicts  in schedule. Per 9/8 sch msg

## 2020-08-22 ENCOUNTER — Inpatient Hospital Stay: Payer: BC Managed Care – PPO

## 2020-08-22 ENCOUNTER — Other Ambulatory Visit: Payer: Self-pay

## 2020-08-22 VITALS — BP 116/52 | HR 72 | Resp 16

## 2020-08-22 DIAGNOSIS — D509 Iron deficiency anemia, unspecified: Secondary | ICD-10-CM | POA: Diagnosis not present

## 2020-08-22 DIAGNOSIS — Z9884 Bariatric surgery status: Secondary | ICD-10-CM | POA: Diagnosis not present

## 2020-08-22 MED ORDER — SODIUM CHLORIDE 0.9 % IV SOLN
INTRAVENOUS | Status: DC
  Filled 2020-08-22: qty 250

## 2020-08-22 MED ORDER — SODIUM CHLORIDE 0.9 % IV SOLN
200.0000 mg | Freq: Once | INTRAVENOUS | Status: AC
  Administered 2020-08-22: 200 mg via INTRAVENOUS
  Filled 2020-08-22: qty 200

## 2020-08-22 NOTE — Patient Instructions (Signed)

## 2020-08-23 DIAGNOSIS — R200 Anesthesia of skin: Secondary | ICD-10-CM | POA: Diagnosis not present

## 2020-08-23 DIAGNOSIS — Z89422 Acquired absence of other left toe(s): Secondary | ICD-10-CM | POA: Diagnosis not present

## 2020-08-23 DIAGNOSIS — R202 Paresthesia of skin: Secondary | ICD-10-CM | POA: Diagnosis not present

## 2020-08-23 DIAGNOSIS — M2042 Other hammer toe(s) (acquired), left foot: Secondary | ICD-10-CM | POA: Diagnosis not present

## 2020-08-26 ENCOUNTER — Other Ambulatory Visit: Payer: Self-pay

## 2020-08-26 ENCOUNTER — Inpatient Hospital Stay: Payer: BC Managed Care – PPO

## 2020-08-26 VITALS — BP 117/43 | HR 77 | Resp 16

## 2020-08-26 DIAGNOSIS — D509 Iron deficiency anemia, unspecified: Secondary | ICD-10-CM | POA: Diagnosis not present

## 2020-08-26 DIAGNOSIS — Z9884 Bariatric surgery status: Secondary | ICD-10-CM | POA: Diagnosis not present

## 2020-08-26 MED ORDER — SODIUM CHLORIDE 0.9 % IV SOLN
200.0000 mg | Freq: Once | INTRAVENOUS | Status: AC
  Administered 2020-08-26: 200 mg via INTRAVENOUS
  Filled 2020-08-26: qty 200

## 2020-08-26 MED ORDER — SODIUM CHLORIDE 0.9 % IV SOLN
INTRAVENOUS | Status: DC
  Filled 2020-08-26: qty 250

## 2020-08-26 NOTE — Progress Notes (Signed)
Patient declined to stay for the post infusion observation period. Patient denies any difficulty with this infusion in the past and is aware to call with any questions or concerns.   Pt verbalized understanding and had no further questions today.   

## 2020-08-26 NOTE — Patient Instructions (Signed)

## 2020-08-29 ENCOUNTER — Other Ambulatory Visit: Payer: Self-pay

## 2020-08-29 ENCOUNTER — Inpatient Hospital Stay: Payer: BC Managed Care – PPO

## 2020-08-29 VITALS — BP 106/41 | HR 68 | Temp 98.7°F | Resp 19

## 2020-08-29 DIAGNOSIS — Z9884 Bariatric surgery status: Secondary | ICD-10-CM | POA: Diagnosis not present

## 2020-08-29 DIAGNOSIS — D509 Iron deficiency anemia, unspecified: Secondary | ICD-10-CM

## 2020-08-29 MED ORDER — SODIUM CHLORIDE 0.9 % IV SOLN
200.0000 mg | Freq: Once | INTRAVENOUS | Status: AC
  Administered 2020-08-29: 200 mg via INTRAVENOUS
  Filled 2020-08-29: qty 200

## 2020-08-29 NOTE — Patient Instructions (Signed)

## 2020-09-05 ENCOUNTER — Other Ambulatory Visit: Payer: Self-pay | Admitting: Psychiatry

## 2020-09-05 DIAGNOSIS — F411 Generalized anxiety disorder: Secondary | ICD-10-CM

## 2020-09-06 DIAGNOSIS — L089 Local infection of the skin and subcutaneous tissue, unspecified: Secondary | ICD-10-CM | POA: Diagnosis not present

## 2020-09-06 DIAGNOSIS — Z5181 Encounter for therapeutic drug level monitoring: Secondary | ICD-10-CM | POA: Diagnosis not present

## 2020-09-06 DIAGNOSIS — L639 Alopecia areata, unspecified: Secondary | ICD-10-CM | POA: Diagnosis not present

## 2020-09-07 ENCOUNTER — Other Ambulatory Visit: Payer: Self-pay | Admitting: Psychiatry

## 2020-09-07 DIAGNOSIS — F411 Generalized anxiety disorder: Secondary | ICD-10-CM

## 2020-09-07 NOTE — Telephone Encounter (Signed)
Last apt over a year ago 07/2019, not sure he's still under your care?

## 2020-10-10 ENCOUNTER — Other Ambulatory Visit: Payer: Self-pay | Admitting: Psychiatry

## 2020-10-10 DIAGNOSIS — F3341 Major depressive disorder, recurrent, in partial remission: Secondary | ICD-10-CM

## 2020-10-10 DIAGNOSIS — F411 Generalized anxiety disorder: Secondary | ICD-10-CM

## 2020-10-11 ENCOUNTER — Other Ambulatory Visit: Payer: Self-pay | Admitting: Psychiatry

## 2020-10-11 DIAGNOSIS — F411 Generalized anxiety disorder: Secondary | ICD-10-CM

## 2020-10-11 NOTE — Telephone Encounter (Signed)
Last apt 10/2019 

## 2020-10-18 ENCOUNTER — Telehealth: Payer: Self-pay

## 2020-10-18 NOTE — Telephone Encounter (Signed)
Called [phone] and left a vm with r/s appt from 11/16/20 to 11/18/20... AOM

## 2020-10-29 ENCOUNTER — Other Ambulatory Visit: Payer: Self-pay | Admitting: Psychiatry

## 2020-10-29 DIAGNOSIS — F5101 Primary insomnia: Secondary | ICD-10-CM

## 2020-10-29 DIAGNOSIS — F411 Generalized anxiety disorder: Secondary | ICD-10-CM

## 2020-11-08 NOTE — Telephone Encounter (Signed)
Last apt 10/2019 

## 2020-11-09 MED ORDER — ZOLPIDEM TARTRATE ER 12.5 MG PO TBCR: EXTENDED_RELEASE_TABLET | ORAL | 0 refills | Status: DC

## 2020-11-09 MED ORDER — ALPRAZOLAM 0.25 MG PO TABS: ORAL_TABLET | ORAL | 1 refills | Status: DC

## 2020-11-09 NOTE — Addendum Note (Signed)
Addended by: Derenda Mis on: 11/09/2020 12:46 PM   Modules accepted: Orders

## 2020-11-09 NOTE — Telephone Encounter (Signed)
Left a message to call back for appointment.

## 2020-11-09 NOTE — Telephone Encounter (Signed)
Next avail appt made for Jan 20th.

## 2020-11-15 ENCOUNTER — Ambulatory Visit: Payer: BC Managed Care – PPO | Admitting: Family

## 2020-11-15 ENCOUNTER — Other Ambulatory Visit: Payer: BC Managed Care – PPO

## 2020-11-18 ENCOUNTER — Telehealth: Payer: Self-pay | Admitting: Family

## 2020-11-18 ENCOUNTER — Inpatient Hospital Stay (HOSPITAL_BASED_OUTPATIENT_CLINIC_OR_DEPARTMENT_OTHER): Payer: Medicare Other | Admitting: Family

## 2020-11-18 ENCOUNTER — Other Ambulatory Visit: Payer: Self-pay

## 2020-11-18 ENCOUNTER — Inpatient Hospital Stay: Payer: Medicare Other | Attending: Hematology & Oncology

## 2020-11-18 ENCOUNTER — Encounter: Payer: Self-pay | Admitting: Family

## 2020-11-18 ENCOUNTER — Telehealth: Payer: Self-pay | Admitting: Hematology & Oncology

## 2020-11-18 VITALS — BP 112/45 | HR 73 | Temp 98.0°F | Resp 18 | Ht 70.0 in | Wt 198.1 lb

## 2020-11-18 DIAGNOSIS — Z9884 Bariatric surgery status: Secondary | ICD-10-CM | POA: Diagnosis not present

## 2020-11-18 DIAGNOSIS — D508 Other iron deficiency anemias: Secondary | ICD-10-CM

## 2020-11-18 DIAGNOSIS — D509 Iron deficiency anemia, unspecified: Secondary | ICD-10-CM | POA: Diagnosis present

## 2020-11-18 DIAGNOSIS — G629 Polyneuropathy, unspecified: Secondary | ICD-10-CM | POA: Insufficient documentation

## 2020-11-18 DIAGNOSIS — I2510 Atherosclerotic heart disease of native coronary artery without angina pectoris: Secondary | ICD-10-CM

## 2020-11-18 DIAGNOSIS — K909 Intestinal malabsorption, unspecified: Secondary | ICD-10-CM

## 2020-11-18 LAB — CMP (CANCER CENTER ONLY)
ALT: 59 U/L — ABNORMAL HIGH (ref 0–44)
AST: 33 U/L (ref 15–41)
Albumin: 4.5 g/dL (ref 3.5–5.0)
Alkaline Phosphatase: 58 U/L (ref 38–126)
Anion gap: 5 (ref 5–15)
BUN: 49 mg/dL — ABNORMAL HIGH (ref 8–23)
CO2: 30 mmol/L (ref 22–32)
Calcium: 9.7 mg/dL (ref 8.9–10.3)
Chloride: 107 mmol/L (ref 98–111)
Creatinine: 1.19 mg/dL (ref 0.61–1.24)
GFR, Estimated: >60 mL/min (ref >60)
Glucose, Bld: 83 mg/dL (ref 70–99)
Potassium: 5.9 mmol/L — ABNORMAL HIGH (ref 3.5–5.1)
Sodium: 142 mmol/L (ref 135–145)
Total Bilirubin: 0.4 mg/dL (ref 0.3–1.2)
Total Protein: 6.9 g/dL (ref 6.5–8.1)

## 2020-11-18 LAB — CBC WITH DIFFERENTIAL (CANCER CENTER ONLY)
Abs Immature Granulocytes: 0.03 10*3/uL (ref 0.00–0.07)
Basophils Absolute: 0 10*3/uL (ref 0.0–0.1)
Basophils Relative: 0 %
Eosinophils Absolute: 0.2 10*3/uL (ref 0.0–0.5)
Eosinophils Relative: 2 %
HCT: 42 % (ref 39.0–52.0)
Hemoglobin: 13.3 g/dL (ref 13.0–17.0)
Immature Granulocytes: 0 %
Lymphocytes Relative: 20 %
Lymphs Abs: 1.4 10*3/uL (ref 0.7–4.0)
MCH: 29.8 pg (ref 26.0–34.0)
MCHC: 31.7 g/dL (ref 30.0–36.0)
MCV: 94.2 fL (ref 80.0–100.0)
Monocytes Absolute: 0.6 10*3/uL (ref 0.1–1.0)
Monocytes Relative: 9 %
Neutro Abs: 4.5 10*3/uL (ref 1.7–7.7)
Neutrophils Relative %: 69 %
Platelet Count: 204 10*3/uL (ref 150–400)
RBC: 4.46 MIL/uL (ref 4.22–5.81)
RDW: 12.7 % (ref 11.5–15.5)
WBC Count: 6.7 10*3/uL (ref 4.0–10.5)
nRBC: 0 % (ref 0.0–0.2)

## 2020-11-18 LAB — RETICULOCYTES
Immature Retic Fract: 11.2 % (ref 2.3–15.9)
RBC.: 4.44 MIL/uL (ref 4.22–5.81)
Retic Count, Absolute: 87.9 10*3/uL (ref 19.0–186.0)
Retic Ct Pct: 2 % (ref 0.4–3.1)

## 2020-11-18 NOTE — Progress Notes (Signed)
Hematology and Oncology Follow Up Visit  Alec Taylor [id number] 1946/03/07 74 y.o. 11/18/2020   Principle Diagnosis:  Microcytic anemia, likely due to hx of gastric bypass  Current Therapy: IV iron as indicated   Interim History:  Alec Taylor is here today for follow-up. He is doing well and has no complaints at this time.  He has even started playing a little golf again.  No blood loss noted. He does bruise easily on daily baby aspirin.  No fever, chills, n/v, cough, rash, dizziness, SOB, chest pain, palpitations, abdominal pain or changes in bowel or bladder habits.  No swelling or tenderness in his extremities at this time.  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 This is described as stable/unchanged.  No syncopal episodes to report.  He has maintained a good appetite and is staying well hydrated. His weight is stable.   ECOG Performance Status: 0 - Asymptomatic  Medications:  Allergies as of 11/18/2020      Reactions   Celecoxib Other (See Comments)   hyperkalemia   Losartan Other (See Comments)   hyperkalemia   Ramipril Cough         Medication List       Accurate as of November 18, 2020 12:06 PM. If you have any questions, ask your nurse or doctor.        ALPRAZolam 0.25 MG tablet Commonly known as: XANAX TAKE ONE-HALF TO ONE TABLET BY MOUTH ONE TIME DAILY AS NEEDED FOR ANXIETY   aspirin EC 81 MG tablet Take 81 mg by mouth at bedtime.   atorvastatin 20 MG tablet Commonly known as: LIPITOR TAKE 1 TABLET BY MOUTH EVERY DAY   carvedilol 6.25 MG tablet Commonly known as: COREG Take 1 tablet (6.25 mg total) by mouth 2 (two) times daily with a meal.   DULoxetine 60 MG capsule Commonly known as: CYMBALTA TAKE ONE CAPSULE BY MOUTH ONE TIME DAILY   Entresto 49-51 MG Generic drug: sacubitril-valsartan Take 1 tablet by mouth 2 (two) times daily.   finasteride 5 MG tablet Commonly known as: PROSCAR Take 5 mg by mouth daily.   folic acid 1 MG  tablet Commonly known as: FOLVITE Take 1 mg by mouth daily.   furosemide 20 MG tablet Commonly known as: Lasix Take 1 tablet (20 mg total) by mouth every Monday, Wednesday, and Friday.   glimepiride 2 MG tablet Commonly known as: AMARYL Take 2 mg by mouth every evening.   Jardiance 10 MG Tabs tablet Generic drug: empagliflozin TAKE 1 TABLET BY MOUTH EVERY DAY   metFORMIN 500 MG 24 hr tablet Commonly known as: GLUCOPHAGE-XR Take 500 mg by mouth 2 (two) times daily.   methotrexate 2.5 MG tablet Commonly known as: RHEUMATREX Take 12.5 mg by mouth once a week. On Wednesday   MULTIVITAMIN ADULT PO Take 1 tablet by mouth daily. Centrum silver   ofloxacin 0.3 % ophthalmic solution Commonly known as: OCUFLOX Place 1 drop into both eyes as needed (Before procedure eye injections). Prior to procedures.   pantoprazole 40 MG tablet Commonly known as: PROTONIX Take 40 mg by mouth daily.   potassium chloride SA 20 MEQ tablet Commonly known as: KLOR-CON Take 1 tablet (20 mEq total) by mouth 3 (three) times a week. Take 1 tablet ( 20 meq ) by mouth with Furosemide ( Lasix ) 3 Times A Week.   spironolactone 25 MG tablet Commonly known as: ALDACTONE Take 0.5 tablets (12.5 mg total) by mouth daily.   triamcinolone cream 0.5 %  Commonly known as: KENALOG Apply 1 application topically 2 (two) times daily.   Vitamin D3 125 MCG (5000 UT) Caps Take 5,000 Units by mouth daily.   zolpidem 12.5 MG CR tablet Commonly known as: AMBIEN CR TAKE ONE TABLET BY MOUTH ONE TIME DAILY AT BEDTIME AS NEEDED FOR SLEEP Start taking on: December 06, 2020       Allergies:  Allergies  Allergen Reactions  . Celecoxib Other (See Comments)    hyperkalemia  . Losartan Other (See Comments)    hyperkalemia  . Ramipril Cough         Past Medical History, Surgical history, Social history, and Family History were reviewed and updated.  Review of Systems: All other 10 point review of systems is  negative.   Physical Exam:  height is 5\' 10"  (1.778 m) and weight is 198 lb 1.9 oz (89.9 kg). His oral temperature is 98 F (36.7 C). His blood pressure is 112/45 (abnormal) and his pulse is 73. His respiration is 18 and oxygen saturation is 100%.   Wt Readings from Last 3 Encounters:  11/18/20 198 lb 1.9 oz (89.9 kg)  08/16/20 197 lb 1.9 oz (89.4 kg)  06/14/20 190 lb (86.2 kg)    Ocular: Sclerae unicteric, pupils equal, round and reactive to light Ear-nose-throat: Oropharynx clear, dentition fair Lymphatic: No cervical or supraclavicular adenopathy Lungs no rales or rhonchi, good excursion bilaterally Heart regular rate and rhythm, no murmur appreciated Abd soft, nontender, positive bowel sounds MSK no focal spinal tenderness, no joint edema Neuro: non-focal, well-oriented, appropriate affect Breasts: Deferred   Lab Results  Component Value Date   WBC 6.7 11/18/2020   HGB 13.3 11/18/2020   HCT 42.0 11/18/2020   MCV 94.2 11/18/2020   PLT 204 11/18/2020   Lab Results  Component Value Date   FERRITIN 46 08/16/2020   IRON 71 08/16/2020   TIBC 414 (H) 08/16/2020   UIBC 342 08/16/2020   IRONPCTSAT 17 (L) 08/16/2020   Lab Results  Component Value Date   RETICCTPCT 2.0 11/18/2020   RBC 4.44 11/18/2020   No results found for: KPAFRELGTCHN, LAMBDASER, KAPLAMBRATIO No results found for: IGGSERUM, IGA, IGMSERUM No results found for: 14/09/2020, SPEI   Chemistry      Component Value Date/Time   NA 142 11/18/2020 1129   NA 140 07/08/2018 0926   K 5.9 (H) 11/18/2020 1129   CL 107 11/18/2020 1129   CO2 30 11/18/2020 1129   BUN 49 (H) 11/18/2020 1129   BUN 27 07/08/2018 0926   CREATININE 1.19 11/18/2020 1129      Component Value Date/Time   CALCIUM 9.7 11/18/2020 1129   ALKPHOS 58 11/18/2020 1129   AST 33 11/18/2020 1129   ALT 59 (H) 11/18/2020 1129   BILITOT 0.4 11/18/2020 1129       Impression and Plan: Mr.  Alec Taylor is a very pleasant 74 yo caucasian gentleman with history of iron deficiency anemia secondary to malabsorption after gastric bypass.  We will see what his iron studies look like and replace if needed.  Follow-up in 3 months.  He was encouraged to contact our office with any questions or concerns. We can certainly see him sooner if needed.   61, NP 12/10/202112:06 PM

## 2020-11-18 NOTE — Telephone Encounter (Signed)
Appointments scheduled calendar printed & mailed per 12/10 los 

## 2020-11-21 LAB — FERRITIN: Ferritin: 102 ng/mL (ref 24–336)

## 2020-11-21 LAB — IRON AND TIBC
Iron: 83 ug/dL (ref 42–163)
Saturation Ratios: 24 % (ref 20–55)
TIBC: 348 ug/dL (ref 202–409)
UIBC: 264 ug/dL (ref 117–376)

## 2020-11-24 ENCOUNTER — Other Ambulatory Visit (HOSPITAL_COMMUNITY): Payer: Self-pay | Admitting: *Deleted

## 2020-11-24 MED ORDER — FUROSEMIDE 20 MG PO TABS: 20.0000 mg | ORAL_TABLET | ORAL | 3 refills | Status: DC

## 2020-11-24 MED ORDER — ATORVASTATIN CALCIUM 20 MG PO TABS: 20.0000 mg | ORAL_TABLET | Freq: Every day | ORAL | 11 refills | Status: DC

## 2020-11-24 MED ORDER — POTASSIUM CHLORIDE CRYS ER 20 MEQ PO TBCR: 20.0000 meq | EXTENDED_RELEASE_TABLET | ORAL | 5 refills | Status: DC

## 2020-11-24 MED ORDER — CARVEDILOL 6.25 MG PO TABS: 6.2500 mg | ORAL_TABLET | Freq: Two times a day (BID) | ORAL | 6 refills | Status: DC

## 2020-11-24 MED ORDER — EMPAGLIFLOZIN 10 MG PO TABS: 10.0000 mg | ORAL_TABLET | Freq: Every day | ORAL | 6 refills | Status: DC

## 2020-11-24 MED ORDER — SPIRONOLACTONE 25 MG PO TABS: 12.5000 mg | ORAL_TABLET | Freq: Every day | ORAL | 3 refills | Status: DC

## 2020-11-24 MED ORDER — ENTRESTO 49-51 MG PO TABS: 1.0000 | ORAL_TABLET | Freq: Two times a day (BID) | ORAL | 3 refills | Status: DC

## 2020-11-28 ENCOUNTER — Other Ambulatory Visit: Payer: Self-pay | Admitting: Psychiatry

## 2020-11-28 DIAGNOSIS — F5101 Primary insomnia: Secondary | ICD-10-CM

## 2020-12-10 ENCOUNTER — Emergency Department (HOSPITAL_BASED_OUTPATIENT_CLINIC_OR_DEPARTMENT_OTHER)
Admission: EM | Admit: 2020-12-10 | Discharge: 2020-12-10 | Disposition: A | Discharge status: Home / Self Care | Payer: Medicare Other | Attending: Emergency Medicine | Admitting: Emergency Medicine

## 2020-12-10 ENCOUNTER — Other Ambulatory Visit: Payer: Self-pay

## 2020-12-10 ENCOUNTER — Encounter (HOSPITAL_BASED_OUTPATIENT_CLINIC_OR_DEPARTMENT_OTHER): Payer: Self-pay | Admitting: Emergency Medicine

## 2020-12-10 ENCOUNTER — Emergency Department (HOSPITAL_BASED_OUTPATIENT_CLINIC_OR_DEPARTMENT_OTHER): Payer: Medicare Other

## 2020-12-10 DIAGNOSIS — I110 Hypertensive heart disease with heart failure: Secondary | ICD-10-CM | POA: Diagnosis not present

## 2020-12-10 DIAGNOSIS — Z79899 Other long term (current) drug therapy: Secondary | ICD-10-CM | POA: Diagnosis not present

## 2020-12-10 DIAGNOSIS — J449 Chronic obstructive pulmonary disease, unspecified: Secondary | ICD-10-CM | POA: Diagnosis not present

## 2020-12-10 DIAGNOSIS — E1142 Type 2 diabetes mellitus with diabetic polyneuropathy: Secondary | ICD-10-CM | POA: Diagnosis not present

## 2020-12-10 DIAGNOSIS — Y93G3 Activity, cooking and baking: Secondary | ICD-10-CM | POA: Diagnosis not present

## 2020-12-10 DIAGNOSIS — I5023 Acute on chronic systolic (congestive) heart failure: Secondary | ICD-10-CM | POA: Insufficient documentation

## 2020-12-10 DIAGNOSIS — S68622A Partial traumatic transphalangeal amputation of right middle finger, initial encounter: Secondary | ICD-10-CM | POA: Insufficient documentation

## 2020-12-10 DIAGNOSIS — Z951 Presence of aortocoronary bypass graft: Secondary | ICD-10-CM | POA: Diagnosis not present

## 2020-12-10 DIAGNOSIS — W268XXA Contact with other sharp object(s), not elsewhere classified, initial encounter: Secondary | ICD-10-CM | POA: Diagnosis not present

## 2020-12-10 DIAGNOSIS — I2510 Atherosclerotic heart disease of native coronary artery without angina pectoris: Secondary | ICD-10-CM | POA: Diagnosis not present

## 2020-12-10 DIAGNOSIS — S68119A Complete traumatic metacarpophalangeal amputation of unspecified finger, initial encounter: Secondary | ICD-10-CM

## 2020-12-10 DIAGNOSIS — Z7982 Long term (current) use of aspirin: Secondary | ICD-10-CM | POA: Insufficient documentation

## 2020-12-10 DIAGNOSIS — Z7984 Long term (current) use of oral hypoglycemic drugs: Secondary | ICD-10-CM | POA: Insufficient documentation

## 2020-12-10 DIAGNOSIS — S60942A Unspecified superficial injury of right middle finger, initial encounter: Secondary | ICD-10-CM | POA: Diagnosis present

## 2020-12-10 DIAGNOSIS — Z23 Encounter for immunization: Secondary | ICD-10-CM | POA: Insufficient documentation

## 2020-12-10 DIAGNOSIS — T1490XA Injury, unspecified, initial encounter: Secondary | ICD-10-CM

## 2020-12-10 MED ORDER — CEPHALEXIN 250 MG PO CAPS: 500.0000 mg | ORAL_CAPSULE | Freq: Once | ORAL | Status: AC

## 2020-12-10 MED ORDER — CEPHALEXIN 500 MG PO CAPS: 500.0000 mg | ORAL_CAPSULE | Freq: Four times a day (QID) | ORAL | 0 refills | Status: AC

## 2020-12-10 MED ORDER — CEPHALEXIN 250 MG PO CAPS
ORAL_CAPSULE | ORAL | Status: AC
  Administered 2020-12-10: 500 mg via ORAL
  Filled 2020-12-10: qty 2

## 2020-12-10 MED ORDER — TETANUS-DIPHTH-ACELL PERTUSSIS 5-2.5-18.5 LF-MCG/0.5 IM SUSY
0.5000 mL | PREFILLED_SYRINGE | Freq: Once | INTRAMUSCULAR | Status: AC
  Administered 2020-12-10: 0.5 mL via INTRAMUSCULAR
  Filled 2020-12-10: qty 0.5

## 2020-12-10 NOTE — ED Notes (Signed)
ED Provider at bedside. 

## 2020-12-10 NOTE — Discharge Instructions (Addendum)
Please keep wound clean and dry. Take antibiotics as indicated to cover for an infection.  Follow up with Dr. 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 You can also follow up with your PCP.   Return to the ED for any worsening symptoms

## 2020-12-10 NOTE — ED Notes (Signed)
Called for patient for x-ray, no answer. Will try later

## 2020-12-10 NOTE — ED Provider Notes (Signed)
[facility] EMERGENCY DEPARTMENT Provider Note   CSN: [id number] Arrival date & time: 12/10/20  1039     History Chief Complaint  Patient presents with  . Finger Injury    Alec Taylor is a 75 y.o. male with PMHx HTN, diabetes, COPD, CAD who presents to the ED today with finger injury to R middle finger that occurred yesterday around 2 PM (approximately 26 hours ago).  Patient states that he was cooking dinner and using a mandolin while chopping potatoes when he accidentally sliced his right middle finger.  He noticed the tip in the potatoes.  He washed his hand and bandage the area however given he is diabetic he wanted to come to the ED for further evaluation today to make sure that he would not have an infection.  He is unsure regarding his tetanus status.  He has no other complaints at this time.  The history is provided by the patient and medical records.       Past Medical History:  Diagnosis Date  . CAD (coronary artery disease) 09/2007   s/p CABG approximately 2004-[facility] (Cath - 17 Sep 2007:90% prox LAD with diffuse 85% narrowing to distal segment, 80% prox CXA, RCA: prox 90%, prox-mid 70%, mid 85%, distal 80%, D1 80%; Normal EF 65% --status post CABG--LIMA to LAD, SVG to PDA.    [name] COPD (chronic obstructive pulmonary disease) (HCC)   . Depression   . Diabetes mellitus without complication (HCC)   . Hypertension   . Ischemic cardiomyopathy   . Pleural effusion   . S/P CABG x 2    CABG--LIMA to LAD, SVG to PDA.      Patient Active Problem List   Diagnosis Date Noted  . Microcytic anemia 02/22/2020  . Generalized anxiety disorder 09/24/2018  . Major depressive disorder, single episode, unspecified 09/24/2018  . Abrasion of great toe, left 07/03/2018  . Chronic systolic heart failure (HCC) 06/13/2018  . Hyperlipidemia LDL goal <70 05/07/2018  . Essential (primary) hypertension 05/07/2018  . Ischemic cardiomyopathy 05/01/2018  . Pleural effusion 04/29/2018  .  Demand ischemia (HCC) 04/29/2018  . Prolonged QT interval 04/29/2018  . Diabetes mellitus type 2 in nonobese (HCC) 04/29/2018  . Anxiety and depression 04/29/2018  . CAD (coronary artery disease) 04/29/2018  . Acute systolic CHF (congestive heart failure) (HCC) 04/28/2018  . Normocytic anemia 04/21/2018  . Multiple pulmonary nodules 12/25/2017  . Callus of foot 10/23/2017  . Metatarsalgia of both feet 10/23/2017  . Hx of CABG 04/28/2017  . Plantar fat pad atrophy of left foot 12/11/2016  . Hallux rigidus of left foot 09/11/2016  . Diabetic toe ulcer (HCC) 05/16/2016  . Contracture of left Achilles tendon 01/18/2016  . Hyperlipidemia LDL goal <100 05/12/2015  . Iron deficiency anemia 07/07/2014  . Major depressive disorder, recurrent episode (HCC) 07/07/2014  . Microalbuminuria due to type 2 diabetes mellitus (HCC) 07/07/2014  . Osteoarthritis 07/07/2014  . Peripheral vascular disease (HCC) 07/07/2014  . Polyneuropathy in diabetes (HCC) 07/07/2014  . Preproliferative diabetic retinopathy (HCC) 07/07/2014  . Testicular hypofunction 07/07/2014  . Status post amputation of foot (HCC) 07/07/2014  . Type 2 diabetes mellitus without complication (HCC) 07/07/2014  . Esophageal reflux 07/06/2014  . Fatty (change of) liver, not elsewhere classified 07/06/2014  . History of colon polyps 07/06/2014  . History of gastric bypass 07/06/2014  . Insomnia 07/06/2014  . Other acquired hammer toe 07/06/2014  . Coronary atherosclerosis 07/06/2014  . Essential hypertension 07/06/2014    Past  Surgical History:  Procedure Laterality Date  . APPENDECTOMY    . CHOLECYSTECTOMY    . CORONARY ARTERY BYPASS GRAFT    . GASTRIC BYPASS    . LEFT HEART CATH AND CORS/GRAFTS ANGIOGRAPHY N/A 04/30/2018   Procedure: LEFT HEART CATH AND CORS/GRAFTS ANGIOGRAPHY;  Surgeon: Marykay Lex, MD;  Location: [facility] INVASIVE CV LAB;  Service: Cardiovascular;  Laterality: N/A;  . RIGHT/LEFT HEART CATH AND CORONARY/GRAFT  ANGIOGRAPHY N/A 02/18/2020   Procedure: RIGHT/LEFT HEART CATH AND CORONARY/GRAFT ANGIOGRAPHY;  Surgeon: Dolores Patty, MD;  Location: [facility] INVASIVE CV LAB;  Service: Cardiovascular;  Laterality: N/A;  . TOE AMPUTATION         Family History  Problem Relation Age of Onset  . Hypertension Father     Social History   Tobacco Use  . Smoking status: Never Smoker  . Smokeless tobacco: Never Used  Vaping Use  . Vaping Use: Never used  Substance Use Topics  . Alcohol use: Yes    Alcohol/week: 2.0 standard drinks    Types: 2 Cans of beer per week    Comment: occ  . Drug use: Never    Home Medications Prior to Admission medications   Medication Sig Start Date End Date Taking? Authorizing Provider  cephALEXin (KEFLEX) 500 MG capsule Take 1 capsule (500 mg total) by mouth 4 (four) times daily for 7 days. 12/10/20 12/17/20 Yes Ivah Girardot, PA-C  ALPRAZolam Prudy Feeler) 0.25 MG tablet TAKE ONE-HALF TO ONE TABLET BY MOUTH ONE TIME DAILY AS NEEDED FOR ANXIETY 11/09/20   Corie Chiquito, PMHNP  aspirin EC 81 MG tablet Take 81 mg by mouth at bedtime.     [provider]  atorvastatin (LIPITOR) 20 MG tablet Take 1 tablet (20 mg total) by mouth daily. 11/24/20   Bensimhon, Bevelyn Buckles, MD  carvedilol (COREG) 6.25 MG tablet Take 1 tablet (6.25 mg total) by mouth 2 (two) times daily with a meal. 11/24/20   Bensimhon, Bevelyn Buckles, MD  Cholecalciferol (VITAMIN D3) 5000 units CAPS Take 5,000 Units by mouth daily.  12/19/17   [provider]  DULoxetine (CYMBALTA) 60 MG capsule TAKE ONE CAPSULE BY MOUTH ONE TIME DAILY 10/12/20   Corie Chiquito, PMHNP  empagliflozin (JARDIANCE) 10 MG TABS tablet Take 1 tablet (10 mg total) by mouth daily. 11/24/20   Bensimhon, Bevelyn Buckles, MD  finasteride (PROSCAR) 5 MG tablet Take 5 mg by mouth daily.  04/20/18   [provider]  folic acid (FOLVITE) 1 MG tablet Take 1 mg by mouth daily. 04/20/18   [provider]  furosemide (LASIX) 20 MG tablet  Take 1 tablet (20 mg total) by mouth every Monday, Wednesday, and Friday. 11/25/20   Bensimhon, Bevelyn Buckles, MD  glimepiride (AMARYL) 2 MG tablet Take 2 mg by mouth every evening. 04/20/18   [provider]  metFORMIN (GLUCOPHAGE-XR) 500 MG 24 hr tablet Take 500 mg by mouth 2 (two) times daily. 04/20/18   [provider]  methotrexate (RHEUMATREX) 2.5 MG tablet Take 12.5 mg by mouth once a week. On Wednesday 04/24/18   [provider]  Multiple Vitamins-Minerals (MULTIVITAMIN ADULT PO) Take 1 tablet by mouth daily. Centrum silver    [provider]  ofloxacin (OCUFLOX) 0.3 % ophthalmic solution Place 1 drop into both eyes as needed (Before procedure eye injections). Prior to procedures.    [provider]  pantoprazole (PROTONIX) 40 MG tablet Take 40 mg by mouth daily.  04/20/18   [provider]  potassium  chloride SA (KLOR-CON) 20 MEQ tablet Take 1 tablet (20 mEq total) by mouth 3 (three) times a week. Take 1 tablet ( 20 meq ) by mouth with Furosemide ( Lasix ) 3 Times A Week. 11/25/20   Bensimhon, Bevelyn Buckles, MD  sacubitril-valsartan (ENTRESTO) 49-51 MG Take 1 tablet by mouth 2 (two) times daily. 11/24/20   Bensimhon, Bevelyn Buckles, MD  spironolactone (ALDACTONE) 25 MG tablet Take 0.5 tablets (12.5 mg total) by mouth daily. 11/24/20   Bensimhon, Bevelyn Buckles, MD  triamcinolone cream (KENALOG) 0.5 % Apply 1 application topically 2 (two) times daily. 01/29/20   Sharlene Dory, DO  zolpidem (AMBIEN CR) 12.5 MG CR tablet TAKE ONE TABLET BY MOUTH ONE TIME DAILY AT BEDTIME AS NEEDED FOR SLEEP 11/28/20   Corie Chiquito, PMHNP    Allergies    Celecoxib, Losartan, and Ramipril  Review of Systems   Review of Systems  Constitutional: Negative for chills and fever.  Musculoskeletal: Positive for arthralgias.  Skin: Positive for wound.  All other systems reviewed and are negative.   Physical Exam Updated Vital Signs BP (!) 111/52 (BP Location: Left Arm)    Pulse 73   Temp 98.3 F (36.8 C)   Resp 18   Ht 5\' 10"  (1.778 m)   Wt 86.2 kg   SpO2 97%   BMI 27.26 kg/m   Physical Exam Vitals and nursing note reviewed.  Constitutional:      Appearance: He is not ill-appearing or diaphoretic.  HENT:     Head: Normocephalic and atraumatic.  Eyes:     Conjunctiva/sclera: Conjunctivae normal.  Cardiovascular:     Rate and Rhythm: Normal rate and regular rhythm.     Pulses: Normal pulses.  Pulmonary:     Effort: Pulmonary effort is normal.     Breath sounds: Normal breath sounds. No wheezing, rhonchi or rales.  Abdominal:     Palpations: Abdomen is soft.     Tenderness: There is no abdominal tenderness.  Musculoskeletal:     Cervical back: Neck supple.     Comments: Right middle finger bandaged; bandage removed. Pt with amputation to distal aspect of finger. Bleeding controlled at this time. Sensation intact to the distal aspect. ROM intact to MCP, PIP, and DIP joint. 2+ radial pulse.   Skin:    General: Skin is warm and dry.  Neurological:     Mental Status: He is alert.        ED Results / Procedures / Treatments   Labs (all labs ordered are listed, but only abnormal results are displayed) Labs Reviewed - No data to display  EKG None  Radiology DG Finger Middle Right  Result Date: 12/10/2020 CLINICAL DATA:  Middle finger laceration EXAM: RIGHT MIDDLE FINGER 2+V COMPARISON:  None. FINDINGS: Soft tissue laceration/defect at the tip of the right middle finger. No underlying bony abnormality. No fracture, subluxation or dislocation. No radiopaque foreign body. Degenerative changes in the IP joint. IMPRESSION: No fracture or radiopaque foreign body. Electronically Signed   By: 02/07/2021 M.D.   On: 12/10/2020 11:39    Procedures Procedures (including critical care time)  Medications Ordered in ED Medications  Tdap (BOOSTRIX) injection 0.5 mL (has no administration in time range)    ED Course  I have reviewed the triage  vital signs and the nursing notes.  Pertinent labs & imaging results that were available during my care of the patient were reviewed by me and considered in my medical decision making (see chart  for details).    MDM Rules/Calculators/A&P                          75 year old male presents to the ED today with amputation to distal aspect of his right middle finger that occurred yesterday afternoon, greater than 24 hours old at this time.  His tetanus status is unknown.  On arrival to the ED vitals are stable.  He had an x-ray done which does not show any fracture or foreign body.  On exam he does have amputation to the distal aspect, bleeding controlled.  Given it is 48 hours old there is nothing to do at this time.  We will plan to update tetanus, clean wound, bandage, discharge patient home with antibiotics given he is a diabetic. Pt in agreement with plan and stable for discharge home.   This note was prepared using Dragon voice recognition software and may include unintentional dictation errors due to the inherent limitations of voice recognition software.  Final Clinical Impression(s) / ED Diagnoses Final diagnoses:  Amputation of finger, initial encounter    Rx / DC Orders ED Discharge Orders         Ordered    cephALEXin (KEFLEX) 500 MG capsule  4 times daily        12/10/20 1718           Discharge Instructions     Please keep wound clean and dry. Take antibiotics as indicated to cover for an infection.  Follow up with Dr. Caralyn Guile orthopedics for further evaluation of your distal finger amputation. You can also follow up with your PCP.   Return to the ED for any worsening symptoms        Eustaquio Maize, Hershal Coria 12/10/20 1742    Davonna Belling, MD 12/10/20 2329

## 2020-12-10 NOTE — ED Triage Notes (Signed)
States he amputated the top part of his R middle finger yesterday while chopping potatoes.

## 2020-12-27 ENCOUNTER — Telehealth: Payer: Self-pay

## 2020-12-27 NOTE — Telephone Encounter (Signed)
Prior Authorization submitted and approved for ZOLPIDEM 12.5 MG CR #30/30 OR #90/90 DAY unless on Tier 5. Effective 12/27/2020-12/26/2021. With [facility] ID# [id number]

## 2020-12-29 ENCOUNTER — Ambulatory Visit: Payer: Medicare Other | Admitting: Psychiatry

## 2021-01-06 ENCOUNTER — Ambulatory Visit (INDEPENDENT_AMBULATORY_CARE_PROVIDER_SITE_OTHER): Payer: Medicare Other | Admitting: Family Medicine

## 2021-01-06 ENCOUNTER — Other Ambulatory Visit: Payer: Self-pay

## 2021-01-06 ENCOUNTER — Encounter: Payer: Self-pay | Admitting: Family Medicine

## 2021-01-06 VITALS — BP 126/64 | HR 94 | Resp 15 | Ht 70.0 in | Wt 199.0 lb

## 2021-01-06 DIAGNOSIS — M546 Pain in thoracic spine: Secondary | ICD-10-CM

## 2021-01-06 MED ORDER — PREDNISONE 20 MG PO TABS: 40.0000 mg | ORAL_TABLET | Freq: Every day | ORAL | 0 refills | Status: AC

## 2021-01-06 MED ORDER — CYCLOBENZAPRINE HCL 10 MG PO TABS: 5.0000 mg | ORAL_TABLET | Freq: Three times a day (TID) | ORAL | 0 refills | Status: DC | PRN

## 2021-01-06 NOTE — Progress Notes (Signed)
Musculoskeletal Exam  Patient: Alec Taylor DOB: 22-May-1946  DOS: 01/06/2021  SUBJECTIVE:  Chief Complaint:   Chief Complaint  Patient presents with  . Back Pain    Tuesday-Fall on deck on vacation, fell backwards-mid back pain, hit head     Vernon Maish is a 75 y.o.  male for evaluation and treatment of R sided mid-low pain.   Onset:  3 days ago. Slipped while waling down the steps of a boat.  Location: R sided low back Character:  aching and sharp  Progression of issue:  has slightly improved Associated symptoms: no redness, bruising, swelling Treatment: to date has been none.   Neurovascular symptoms: no  Past Medical History:  Diagnosis Date  . CAD (coronary artery disease) 09/2007   s/p CABG approximately 2004-[facility] (Cath - 17 Sep 2007:90% prox LAD with diffuse 85% narrowing to distal segment, 80% prox CXA, RCA: prox 90%, prox-mid 70%, mid 85%, distal 80%, D1 80%; Normal EF 65% --status post CABG--LIMA to LAD, SVG to PDA.    [name] COPD (chronic obstructive pulmonary disease) (HCC)   . Depression   . Diabetes mellitus without complication (HCC)   . Hypertension   . Ischemic cardiomyopathy   . Pleural effusion   . S/P CABG x 2    CABG--LIMA to LAD, SVG to PDA.      Objective: VITAL SIGNS: BP 126/64 (BP Location: Right Arm, Patient Position: Sitting, Cuff Size: Normal)   Pulse 94   Resp 15   Ht 5\' 10"  (1.778 m)   Wt 199 lb (90.3 kg)   SpO2 99%   BMI 28.55 kg/m  Constitutional: Well formed, well developed. No acute distress. Thorax & Lungs: No accessory muscle use Musculoskeletal: Low back.   Tenderness to palpation: R parasp msc at T8-L1 There is no bony ttp.  Deformity: no Ecchymosis: no  Tests positive: none Tests negative: Straight leg, [place] Tight hamstrings b/l.  Neurologic: Normal sensory function. No focal deficits noted. DTR's equal and symmetric in LE's. No clonus. Psychiatric: Normal mood. Age appropriate judgment and insight. Alert & oriented x 3.     Assessment:  Acute right-sided thoracic back pain - Plan: predniSONE (DELTASONE) 20 MG tablet, cyclobenzaprine (FLEXERIL) 10 MG tablet  Plan: Stretches/exercises, heat, ice, Tylenol. 5 d pred burst 40 mg/d. Warnings about Flexeril verbalized and written down. He has been on it in the past and has done well with tolerating it.  F/u as originally scheduled. The patient voiced understanding and agreement to the plan.   [place], DO 01/06/21  11:48 AM

## 2021-01-06 NOTE — Patient Instructions (Signed)
Heat (pad or rice pillow in microwave) over affected area, 10-15 minutes twice daily.   Ice/cold pack over area for 10-15 min twice daily.  OK to take Tylenol 1000 mg (2 extra strength tabs) or 975 mg (3 regular strength tabs) every 6 hours as needed.  Take Flexeril (cyclobenzaprine) 1-2 hours before planned bedtime. If it makes you drowsy, do not take during the day. You can try half a tab the following night.  Let us know if you need anything.  Mid-Back Strain Rehab It is normal to feel mild stretching, pulling, tightness, or discomfort as you do these exercises, but you should stop right away if you feel sudden pain or your pain gets worse.   Stretching and range of motion exercises This exercise warms up your muscles and joints and improves the movement and flexibility of your back and shoulders. This exercise also help to relieve pain. Exercise A: Chest and spine stretch    1. Lie down on your back on a firm surface. 2. Roll a towel or a small blanket so it is about 4 inches (10 cm) in diameter. 3. Put the towel lengthwise under the middle of your back so it is under your spine, but not under your shoulder blades. 4. To increase the stretch, you may put your hands behind your head and let your elbows fall to your sides. 5. Hold for 30 seconds. Repeat exercise 2 times. Complete this exercise 3 times per week.  Strengthening exercises These exercises build strength and endurance in your back and your shoulder blade muscles. Endurance is the ability to use your muscles for a long time, even after they get tired. Exercise B: Alternating arm and leg raises    1. Get on your hands and knees on a firm surface. If you are on a hard floor, you may want to use padding to cushion your knees, such as an exercise mat. 2. Line up your arms and legs. Your hands should be below your shoulders, and your knees should be below your hips. 3. Lift your left leg behind you. At the same time, raise  your right arm and straighten it in front of you. ? Do not lift your leg higher than your hip. ? Do not lift your arm higher than your shoulder. ? Keep your abdominal and back muscles tight. ? Keep your hips facing the ground. ? Do not arch your back. ? Keep your balance carefully, and do not hold your breath. 4. Hold for 3 seconds. 5. Slowly return to the starting position and repeat with your right leg and your left arm. Repeat 2 times. Complete this exercise 3 times per week. Exercise C: Straight arm rows (shoulder extension)     1. Stand with your feet shoulder width apart. 2. Secure an exercise band to a stable object in front of you so the band is at or above shoulder height. 3. Hold one end of the exercise band in each hand. 4. Straighten your elbows and lift your hands up to shoulder height. 5. Step back, away from the secured end of the exercise band, until the band stretches. 6. Squeeze your shoulder blades together and pull your hands down to the sides of your thighs. Stop when your hands are straight down by your sides. Do not let your hands go behind your body. 7. Hold for 3 seconds. 8. Slowly return to the starting position. Repeat 2 times. Complete this exercise 3 times per week. Exercise D: Shoulder external  external rotation, prone 1. Lie on your abdomen on a firm bed so your left / right forearm hangs over the edge of the bed and your upper arm is on the bed, straight out from your body. ? Your elbow should be bent. ? Your palm should be facing your feet. 2. If instructed, hold a 5 lb weight in your hand. 3. Squeeze your shoulder blade toward the middle of your back. Do not let your shoulder lift toward your ear. 4. Keep your elbow bent in an "L" shape (90 degrees) while you slowly move your forearm up toward the ceiling. Move your forearm up to the height of the bed, toward your head. ? Your upper arm should not move. ? At the top of the movement, your palm should face the  floor. 5. Hold for 3 seconds. 6. Slowly return to the starting position and relax your muscles. Repeat 3 times. Complete this exercise 3 times per week. Exercise E: Scapular retraction and external rotation, rowing    1. Sit in a stable chair without armrests, or stand. 2. Secure an exercise band to a stable object in front of you so it is at shoulder height. 3. Hold one end of the exercise band in each hand. 4. Bring your arms out straight in front of you. 5. Step back, away from the secured end of the exercise band, until the band stretches. 6. Pull the band backward. As you do this, bend your elbows and squeeze your shoulder blades together, but avoid letting the rest of your body move. Do not let your shoulders lift up toward your ears. 7. Stop when your elbows are at your sides or slightly behind your body. 8. Hold for 5 seconds. 9. Slowly straighten your arms to return to the starting position. Repeat 2 times. Complete this exercise 3 times per week. Posture and body mechanics    Body mechanics refers to the movements and positions of your body while you do your daily activities. Posture is part of body mechanics. Good posture and healthy body mechanics can help to relieve stress in your body's tissues and joints. Good posture means that your spine is in its natural S-curve position (your spine is neutral), your shoulders are pulled back slightly, and your head is not tipped forward. The following are general guidelines for applying improved posture and body mechanics to your everyday activities. Standing     When standing, keep your spine neutral and your feet about hip-width apart. Keep a slight bend in your knees. Your ears, shoulders, and hips should line up.  When you do a task in which you lean forward while standing in one place for a long time, place one foot up on a stable object that is 2-4 inches (5-10 cm) high, such as a footstool. This helps keep your spine  neutral. Sitting     When sitting, keep your spine neutral and keep your feet flat on the floor. Use a footrest, if necessary, and keep your thighs parallel to the floor. Avoid rounding your shoulders, and avoid tilting your head forward.  When working at a desk or a computer, keep your desk at a height where your hands are slightly lower than your elbows. Slide your chair under your desk so you are close enough to maintain good posture.  When working at a computer, place your monitor at a height where you are looking straight ahead and you do not have to tilt your head forward or downward to   at the screen. Resting    When lying down and resting, avoid positions that are most painful for you.  If you have pain with activities such as sitting, bending, stooping, or squatting (flexion-based activities), lie in a position in which your body does not bend very much. For example, avoid curling up on your side with your arms and knees near your chest (fetal position).  If you have pain with activities such as standing for a long time or reaching with your arms (extension-based activities), lie with your spine in a neutral position and bend your knees slightly. Try the following positions:  Lying on your side with a pillow between your knees.  Lying on your back with a pillow under your knees.   Lifting     When lifting objects, keep your feet at least shoulder-width apart and tighten your abdominal muscles.  Bend your knees and hips and keep your spine neutral. It is important to lift using the strength of your legs, not your back. Do not lock your knees straight out.  Always ask for help to lift heavy or awkward objects. Make sure you discuss any questions you have with your health care provider.

## 2021-01-20 ENCOUNTER — Telehealth: Payer: Self-pay | Admitting: Psychiatry

## 2021-01-20 DIAGNOSIS — F3341 Major depressive disorder, recurrent, in partial remission: Secondary | ICD-10-CM

## 2021-01-20 DIAGNOSIS — F5101 Primary insomnia: Secondary | ICD-10-CM

## 2021-01-20 DIAGNOSIS — F411 Generalized anxiety disorder: Secondary | ICD-10-CM

## 2021-01-20 MED ORDER — ZOLPIDEM TARTRATE ER 12.5 MG PO TBCR
EXTENDED_RELEASE_TABLET | ORAL | 1 refills | Status: DC
Start: 2021-01-20 — End: 2021-02-13

## 2021-01-20 MED ORDER — DULOXETINE HCL 60 MG PO CPEP: 60.0000 mg | ORAL_CAPSULE | Freq: Every day | ORAL | 0 refills | Status: DC

## 2021-01-20 NOTE — Telephone Encounter (Signed)
Rec'd faxed refill request for Cymbalta from pharmacy. Script sent.

## 2021-01-20 NOTE — Telephone Encounter (Signed)
Pt did not answer,LVM

## 2021-01-20 NOTE — Telephone Encounter (Signed)
Sent!

## 2021-01-20 NOTE — Telephone Encounter (Signed)
Pt called asking about rx for Ambien. Pt said that Pharmacy faxed over a request on Wednesday. He does not have any to take tonight. Please send to [facility] on [address] [facility].

## 2021-02-13 ENCOUNTER — Encounter: Payer: Self-pay | Admitting: Psychiatry

## 2021-02-13 ENCOUNTER — Other Ambulatory Visit: Payer: Self-pay

## 2021-02-13 ENCOUNTER — Ambulatory Visit (INDEPENDENT_AMBULATORY_CARE_PROVIDER_SITE_OTHER): Payer: Medicare Other | Admitting: Psychiatry

## 2021-02-13 DIAGNOSIS — F3341 Major depressive disorder, recurrent, in partial remission: Secondary | ICD-10-CM | POA: Diagnosis not present

## 2021-02-13 DIAGNOSIS — F411 Generalized anxiety disorder: Secondary | ICD-10-CM | POA: Diagnosis not present

## 2021-02-13 DIAGNOSIS — F5101 Primary insomnia: Secondary | ICD-10-CM | POA: Diagnosis not present

## 2021-02-13 MED ORDER — ALPRAZOLAM 0.25 MG PO TABS: ORAL_TABLET | ORAL | 2 refills | Status: DC

## 2021-02-13 MED ORDER — DULOXETINE HCL 60 MG PO CPEP: 60.0000 mg | ORAL_CAPSULE | Freq: Every day | ORAL | 0 refills | Status: DC

## 2021-02-13 MED ORDER — BUPROPION HCL ER (XL) 150 MG PO TB24: 150.0000 mg | ORAL_TABLET | Freq: Every day | ORAL | 2 refills | Status: DC

## 2021-02-13 MED ORDER — ZOLPIDEM TARTRATE ER 12.5 MG PO TBCR: EXTENDED_RELEASE_TABLET | ORAL | 2 refills | Status: DC

## 2021-02-13 NOTE — Progress Notes (Signed)
Alec Taylor [id number] January 09, 1946 75 y.o.  Subjective:   Patient ID:  Alec Taylor is a 75 y.o. (DOB February 03, 1946) male.  Chief Complaint:  Chief Complaint  Patient presents with  . Depression  . Follow-up    H/o Anxiety, Insomnia    HPI Alec Taylor presents to the office today for follow-up of depression, anxiety, and insomnia. He reports that his mood has been good. 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 He reports that he will use Xanax prn prior to some situations that will typically trigger anxiety. He reports that he gets up and makes a latte. He reports that his energy and motivation. He reports insomnia without medication. He is able to sleep 5-6 hours with Ambien CR. Appetite has been ok. He reports concentration is fair. "I've got a lot to live for." Denies SI.   Enjoys watching and playing golf.   He reports that Duloxetine has been helpful for his depression and irritability.   He is pursing his lips repeatedly and at times and he does not recognize this. He reports that his wife notices this.  Denies SI.  Daughter has not had any recurrence of brain cancer. Daughter had a grand mal seizure about 6 months ago. Daughter is getting married in a couple of weeks in [place]. Daughter is getting a Education administrator in art history. 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   Past Psychiatric Medication Trials: Cymbalta Aricept Wellbutrin BuSpar- Dizziness Ambien Ambien CR Xanax Klonopin    Review of Systems:  Review of Systems  Musculoskeletal: Negative for gait problem.       Shoulder pain  Neurological: Negative for tremors and headaches.  Psychiatric/Behavioral:       Please refer to HPI    Medications: I have reviewed the patient's current medications.  Current Outpatient Medications  Medication Sig Dispense Refill  . aspirin EC 81 MG tablet Take 81 mg by mouth at bedtime.     [name] atorvastatin (LIPITOR) 20 MG tablet  Take 1 tablet (20 mg total) by mouth daily. 30 tablet 11  . buPROPion (WELLBUTRIN XL) 150 MG 24 hr tablet Take 1 tablet (150 mg total) by mouth daily. 30 tablet 2  . carvedilol (COREG) 6.25 MG tablet Take 1 tablet (6.25 mg total) by mouth 2 (two) times daily with a meal. 60 tablet 6  . Cholecalciferol (VITAMIN D3) 5000 units CAPS Take 5,000 Units by mouth daily.     . empagliflozin (JARDIANCE) 10 MG TABS tablet Take 1 tablet (10 mg total) by mouth daily. 30 tablet 6  . finasteride (PROSCAR) 5 MG tablet Take 5 mg by mouth daily.   6  . folic acid (FOLVITE) 1 MG tablet Take 1 mg by mouth daily.  2  . furosemide (LASIX) 20 MG tablet Take 1 tablet (20 mg total) by mouth every Monday, Wednesday, and Friday. 39 tablet 3  . glimepiride (AMARYL) 2 MG tablet Take 2 mg by mouth every evening.  1  . metFORMIN (GLUCOPHAGE-XR) 500 MG 24 hr tablet Take 500 mg by mouth 2 (two) times daily.  1  . Multiple Vitamins-Minerals (MULTIVITAMIN ADULT PO) Take 1 tablet by mouth daily. Centrum silver    . sacubitril-valsartan (ENTRESTO) 49-51 MG Take 1 tablet by mouth 2 (two) times daily. 180 tablet 3  . [START ON 03/13/2021] ALPRAZolam (XANAX) 0.25 MG tablet TAKE ONE-HALF TO ONE TABLET BY MOUTH ONE TIME DAILY AS NEEDED FOR ANXIETY 30 tablet 2  .  cephALEXin (KEFLEX) 500 MG capsule TAKE 1 CAPSULE BY MOUTH FOUR TIMES DAILY FOR 10 DAYS (Patient not taking: Reported on 02/13/2021)    . cyclobenzaprine (FLEXERIL) 10 MG tablet Take 0.5-1 tablets (5-10 mg total) by mouth 3 (three) times daily as needed for muscle spasms. 21 tablet 0  . DULoxetine (CYMBALTA) 60 MG capsule Take 1 capsule (60 mg total) by mouth daily. 90 capsule 0  . methotrexate (RHEUMATREX) 2.5 MG tablet Take 12.5 mg by mouth once a week. On Wednesday (Patient not taking: Reported on 02/13/2021)  3  . ofloxacin (OCUFLOX) 0.3 % ophthalmic solution Place 1 drop into both eyes as needed (Before procedure eye injections). Prior to procedures.    . pantoprazole (PROTONIX) 40  MG tablet Take 40 mg by mouth daily.  (Patient not taking: Reported on 02/13/2021)  0  . potassium chloride SA (KLOR-CON) 20 MEQ tablet Take 1 tablet (20 mEq total) by mouth 3 (three) times a week. Take 1 tablet ( 20 meq ) by mouth with Furosemide ( Lasix ) 3 Times A Week. (Patient not taking: Reported on 02/13/2021) 12 tablet 5  . spironolactone (ALDACTONE) 25 MG tablet Take 0.5 tablets (12.5 mg total) by mouth daily. (Patient not taking: Reported on 02/13/2021) 45 tablet 3  . triamcinolone cream (KENALOG) 0.5 % Apply 1 application topically 2 (two) times daily. 30 g 0  . [START ON 03/20/2021] zolpidem (AMBIEN CR) 12.5 MG CR tablet TAKE ONE TABLET BY MOUTH ONE TIME DAILY AT BEDTIME AS NEEDED FOR SLEEP 30 tablet 2   No current facility-administered medications for this visit.    Medication Side Effects: None  Allergies:  Allergies  Allergen Reactions  . Celecoxib Other (See Comments)    hyperkalemia  . Losartan Other (See Comments)    hyperkalemia  . Ramipril Cough         Past Medical History:  Diagnosis Date  . CAD (coronary artery disease) 09/2007   s/p CABG approximately 2004-[facility] (Cath - 17 Sep 2007:90% prox LAD with diffuse 85% narrowing to distal segment, 80% prox CXA, RCA: prox 90%, prox-mid 70%, mid 85%, distal 80%, D1 80%; Normal EF 65% --status post CABG--LIMA to LAD, SVG to PDA.    [name] COPD (chronic obstructive pulmonary disease) (HCC)   . Depression   . Diabetes mellitus without complication (HCC)   . Hypertension   . Ischemic cardiomyopathy   . Pleural effusion   . S/P CABG x 2    CABG--LIMA to LAD, SVG to PDA.      Family History  Problem Relation Age of Onset  . Hypertension Father     Social History   Socioeconomic History  . Marital status: Legally Separated    Spouse name: Not on file  . Number of children: Not on file  . Years of education: Not on file  . Highest education level: Not on file  Occupational History  . Not on file  Tobacco Use  . Smoking  status: Never Smoker  . Smokeless tobacco: Never Used  Vaping Use  . Vaping Use: Never used  Substance and Sexual Activity  . Alcohol use: Yes    Alcohol/week: 2.0 standard drinks    Types: 2 Cans of beer per week    Comment: occ  . Drug use: Never  . Sexual activity: Not on file  Other Topics Concern  . Not on file  Social History Narrative  . Not on file   Social Determinants of Health   Financial Resource Strain:  Not on file  Food Insecurity: Not on file  Transportation Needs: Not on file  Physical Activity: Not on file  Stress: Not on file  Social Connections: Not on file  Intimate Partner Violence: Not on file    Past Medical History, Surgical history, Social history, and Family history were reviewed and updated as appropriate.   Please see review of systems for further details on the patient's review from today.   Objective:   Physical Exam:  Wt 196 lb (88.9 kg)   BMI 28.12 kg/m   Physical Exam Constitutional:      General: He is not in acute distress. Musculoskeletal:        General: No deformity.  Neurological:     Mental Status: He is alert and oriented to person, place, and time.     Coordination: Coordination normal.  Psychiatric:        Attention and Perception: Attention and perception normal. He does not perceive auditory or visual hallucinations.        Mood and Affect: Mood is depressed. Mood is not anxious. Affect is not labile, blunt, angry or inappropriate.        Speech: Speech normal.        Behavior: Behavior normal.        Thought Content: Thought content normal. Thought content is not paranoid or delusional. Thought content does not include homicidal or suicidal ideation. Thought content does not include homicidal or suicidal plan.        Cognition and Memory: Cognition and memory normal.        Judgment: Judgment normal.     Comments: Insight intact     Lab Review:     Component Value Date/Time   NA 142 11/18/2020 1129   NA 140  07/08/2018 0926   K 5.9 (H) 11/18/2020 1129   CL 107 11/18/2020 1129   CO2 30 11/18/2020 1129   GLUCOSE 83 11/18/2020 1129   BUN 49 (H) 11/18/2020 1129   BUN 27 07/08/2018 0926   CREATININE 1.19 11/18/2020 1129   CALCIUM 9.7 11/18/2020 1129   PROT 6.9 11/18/2020 1129   ALBUMIN 4.5 11/18/2020 1129   AST 33 11/18/2020 1129   ALT 59 (H) 11/18/2020 1129   ALKPHOS 58 11/18/2020 1129   BILITOT 0.4 11/18/2020 1129   GFRNONAA >60 11/18/2020 1129   GFRAA >60 08/16/2020 1019       Component Value Date/Time   WBC 6.7 11/18/2020 1129   WBC 6.8 02/12/2020 1601   RBC 4.44 11/18/2020 1130   RBC 4.46 11/18/2020 1129   HGB 13.3 11/18/2020 1129   HCT 42.0 11/18/2020 1129   PLT 204 11/18/2020 1129   MCV 94.2 11/18/2020 1129   MCH 29.8 11/18/2020 1129   MCHC 31.7 11/18/2020 1129   RDW 12.7 11/18/2020 1129   LYMPHSABS 1.4 11/18/2020 1129   MONOABS 0.6 11/18/2020 1129   EOSABS 0.2 11/18/2020 1129   BASOSABS 0.0 11/18/2020 1129    No results found for: POCLITH, LITHIUM   No results found for: PHENYTOIN, PHENOBARB, VALPROATE, CBMZ   .res Assessment: Plan:     Patient seen for 30 minutes and time spent counseling patient regarding possible treatment options for depression.  Discussed considering restart of Wellbutrin XL to improve depression since patient reports that it may have been helpful for his depression in the past.  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  Patient agrees to restarting Wellbutrin XL 150 mg daily for depression. Continue Cymbalta 60 mg daily for anxiety and depression. Continue alprazolam as needed for anxiety. Continue Ambien CR 12.5 mg at bedtime as needed for insomnia. Patient to follow-up  in 1 to 2 months or sooner if clinically indicated. Patient advised to contact office with any questions, adverse effects, or acute worsening in signs and symptoms.   Sixto was seen today for depression and follow-up.  Diagnoses and all orders for this visit:  Generalized anxiety disorder -     DULoxetine (CYMBALTA) 60 MG capsule; Take 1 capsule (60 mg total) by mouth daily. -     ALPRAZolam (XANAX) 0.25 MG tablet; TAKE ONE-HALF TO ONE TABLET BY MOUTH ONE TIME DAILY AS NEEDED FOR ANXIETY  Recurrent major depressive disorder, in partial remission (HCC) -     buPROPion (WELLBUTRIN XL) 150 MG 24 hr tablet; Take 1 tablet (150 mg total) by mouth daily. -     DULoxetine (CYMBALTA) 60 MG capsule; Take 1 capsule (60 mg total) by mouth daily.  Primary insomnia -     zolpidem (AMBIEN CR) 12.5 MG CR tablet; TAKE ONE TABLET BY MOUTH ONE TIME DAILY AT BEDTIME AS NEEDED FOR SLEEP     Please see After Visit Summary for patient specific instructions.  Future Appointments  Date Time Provider Department Center  02/16/2021 11:15 AM [facility] None  02/16/2021 11:45 AM [place], Brand Males, NP [facility] None  04/03/2021  9:30 AM Corie Chiquito, PMHNP [facility] None    No orders of the defined types were placed in this encounter.   -------------------------------

## 2021-02-15 ENCOUNTER — Other Ambulatory Visit (HOSPITAL_COMMUNITY): Payer: Self-pay

## 2021-02-15 MED ORDER — EMPAGLIFLOZIN 10 MG PO TABS: 10.0000 mg | ORAL_TABLET | Freq: Every day | ORAL | 0 refills | Status: DC

## 2021-02-16 ENCOUNTER — Other Ambulatory Visit: Payer: Self-pay

## 2021-02-16 ENCOUNTER — Telehealth: Payer: Self-pay

## 2021-02-16 ENCOUNTER — Encounter: Payer: Self-pay | Admitting: Family

## 2021-02-16 ENCOUNTER — Inpatient Hospital Stay: Payer: Medicare Other | Attending: Hematology & Oncology

## 2021-02-16 ENCOUNTER — Inpatient Hospital Stay (HOSPITAL_BASED_OUTPATIENT_CLINIC_OR_DEPARTMENT_OTHER): Payer: Medicare Other | Admitting: Family

## 2021-02-16 VITALS — BP 97/42 | HR 70 | Temp 98.0°F | Resp 17 | Wt 195.0 lb

## 2021-02-16 DIAGNOSIS — K909 Intestinal malabsorption, unspecified: Secondary | ICD-10-CM | POA: Insufficient documentation

## 2021-02-16 DIAGNOSIS — D509 Iron deficiency anemia, unspecified: Secondary | ICD-10-CM | POA: Diagnosis present

## 2021-02-16 DIAGNOSIS — D508 Other iron deficiency anemias: Secondary | ICD-10-CM | POA: Diagnosis not present

## 2021-02-16 DIAGNOSIS — Z9884 Bariatric surgery status: Secondary | ICD-10-CM | POA: Insufficient documentation

## 2021-02-16 LAB — CBC WITH DIFFERENTIAL (CANCER CENTER ONLY)
Abs Immature Granulocytes: 0.07 10*3/uL (ref 0.00–0.07)
Basophils Absolute: 0 10*3/uL (ref 0.0–0.1)
Basophils Relative: 1 %
Eosinophils Absolute: 0.2 10*3/uL (ref 0.0–0.5)
Eosinophils Relative: 3 %
HCT: 40.6 % (ref 39.0–52.0)
Hemoglobin: 12.6 g/dL — ABNORMAL LOW (ref 13.0–17.0)
Immature Granulocytes: 1 %
Lymphocytes Relative: 17 %
Lymphs Abs: 1 10*3/uL (ref 0.7–4.0)
MCH: 28.3 pg (ref 26.0–34.0)
MCHC: 31 g/dL (ref 30.0–36.0)
MCV: 91.2 fL (ref 80.0–100.0)
Monocytes Absolute: 0.6 10*3/uL (ref 0.1–1.0)
Monocytes Relative: 10 %
Neutro Abs: 4.2 10*3/uL (ref 1.7–7.7)
Neutrophils Relative %: 68 %
Platelet Count: 198 10*3/uL (ref 150–400)
RBC: 4.45 MIL/uL (ref 4.22–5.81)
RDW: 13.2 % (ref 11.5–15.5)
WBC Count: 6.1 10*3/uL (ref 4.0–10.5)
nRBC: 0 % (ref 0.0–0.2)

## 2021-02-16 LAB — RETICULOCYTES
Immature Retic Fract: 11.3 % (ref 2.3–15.9)
RBC.: 4.48 MIL/uL (ref 4.22–5.81)
Retic Count, Absolute: 88.3 10*3/uL (ref 19.0–186.0)
Retic Ct Pct: 2 % (ref 0.4–3.1)

## 2021-02-16 NOTE — Telephone Encounter (Signed)
appt s made per 02/16/21 los and p t will get a calendar and view mychart    anne

## 2021-02-16 NOTE — Progress Notes (Signed)
Hematology and Oncology Follow Up Visit  Alec Taylor [id number] [birth year] 75 y.o. 02/16/2021   Principle Diagnosis:  Iron deficiency anemia secondary to gastric bypass  Current Therapy: IV iron as indicated   Interim History:  Alec Taylor is here today for follow-up. He is symptomatic with fatigue and dizziness. His BP was low when he first got here at 84/51, HR 70.  He states that his wife has put them on a vegan diet in order to trim down for their daughter's upcoming wedding. He has not been eating well and admits that he needs to better hydrated.  He drank to cups of water while here and repeat BP was 97/42 and his fatigue and dizziness were improved.  I suggested he avoid crash dieting and increase his hydration. Weight is down 4 lbs since his last visit.  He has not noted any blood loss. No bruising or petechiae.  No fever, chills, n/v, cough, rash, SOB, chest pain, palpitations, abdominal pain or changes in bowel or bladder habits.  No swelling, tenderness, numbness or tingling in her extremities.  No falls or syncope to report.   ECOG Performance Status: 1 - Symptomatic but completely ambulatory  Medications:  Allergies as of 02/16/2021      Reactions   Celecoxib Other (See Comments)   hyperkalemia   Losartan Other (See Comments)   hyperkalemia   Ramipril Cough         Medication List       Accurate as of February 16, 2021 11:38 AM. If you have any questions, ask your nurse or doctor.        ALPRAZolam 0.25 MG tablet Commonly known as: XANAX TAKE ONE-HALF TO ONE TABLET BY MOUTH ONE TIME DAILY AS NEEDED FOR ANXIETY Start taking on: March 13, 2021   aspirin EC 81 MG tablet Take 81 mg by mouth at bedtime.   atorvastatin 20 MG tablet Commonly known as: LIPITOR Take 1 tablet (20 mg total) by mouth daily.   buPROPion 150 MG 24 hr tablet Commonly known as: Wellbutrin XL Take 1 tablet (150 mg total) by mouth daily.   carvedilol 6.25 MG tablet Commonly known as:  COREG Take 1 tablet (6.25 mg total) by mouth 2 (two) times daily with a meal.   cephALEXin 500 MG capsule Commonly known as: KEFLEX TAKE 1 CAPSULE BY MOUTH FOUR TIMES DAILY FOR 10 DAYS   cyclobenzaprine 10 MG tablet Commonly known as: FLEXERIL Take 0.5-1 tablets (5-10 mg total) by mouth 3 (three) times daily as needed for muscle spasms.   DULoxetine 60 MG capsule Commonly known as: CYMBALTA Take 1 capsule (60 mg total) by mouth daily.   empagliflozin 10 MG Tabs tablet Commonly known as: Jardiance Take 1 tablet (10 mg total) by mouth daily. Please make an appointment for refills   Entresto 49-51 MG Generic drug: sacubitril-valsartan Take 1 tablet by mouth 2 (two) times daily.   finasteride 5 MG tablet Commonly known as: PROSCAR Take 5 mg by mouth daily.   folic acid 1 MG tablet Commonly known as: FOLVITE Take 1 mg by mouth daily.   furosemide 20 MG tablet Commonly known as: Lasix Take 1 tablet (20 mg total) by mouth every Monday, Wednesday, and Friday.   glimepiride 2 MG tablet Commonly known as: AMARYL Take 2 mg by mouth every evening.   metFORMIN 500 MG 24 hr tablet Commonly known as: GLUCOPHAGE-XR Take 500 mg by mouth 2 (two) times daily.   methotrexate 2.5 MG tablet Commonly known  as: RHEUMATREX Take 12.5 mg by mouth once a week. On Wednesday   MULTIVITAMIN ADULT PO Take 1 tablet by mouth daily. Centrum silver   ofloxacin 0.3 % ophthalmic solution Commonly known as: OCUFLOX Place 1 drop into both eyes as needed (Before procedure eye injections). Prior to procedures.   pantoprazole 40 MG tablet Commonly known as: PROTONIX Take 40 mg by mouth daily.   potassium chloride SA 20 MEQ tablet Commonly known as: KLOR-CON Take 1 tablet (20 mEq total) by mouth 3 (three) times a week. Take 1 tablet ( 20 meq ) by mouth with Furosemide ( Lasix ) 3 Times A Week.   spironolactone 25 MG tablet Commonly known as: ALDACTONE Take 0.5 tablets (12.5 mg total) by mouth  daily.   triamcinolone cream 0.5 % Commonly known as: KENALOG Apply 1 application topically 2 (two) times daily.   Vitamin D3 125 MCG (5000 UT) Caps Take 5,000 Units by mouth daily.   zolpidem 12.5 MG CR tablet Commonly known as: AMBIEN CR TAKE ONE TABLET BY MOUTH ONE TIME DAILY AT BEDTIME AS NEEDED FOR SLEEP Start taking on: March 20, 2021       Allergies:  Allergies  Allergen Reactions  . Celecoxib Other (See Comments)    hyperkalemia  . Losartan Other (See Comments)    hyperkalemia  . Ramipril Cough         Past Medical History, Surgical history, Social history, and Family History were reviewed and updated.  Review of Systems: All other 10 point review of systems is negative.   Physical Exam:  weight is 195 lb 0.6 oz (88.5 kg). His oral temperature is 98 F (36.7 C). His blood pressure is 84/51 (abnormal) and his pulse is 70. His respiration is 17 and oxygen saturation is 97%.   Wt Readings from Last 3 Encounters:  02/16/21 195 lb 0.6 oz (88.5 kg)  01/06/21 199 lb (90.3 kg)  12/10/20 190 lb (86.2 kg)    Ocular: Sclerae unicteric, pupils equal, round and reactive to light Ear-nose-throat: Oropharynx clear, dentition fair Lymphatic: No cervical or supraclavicular adenopathy Lungs no rales or rhonchi, good excursion bilaterally Heart regular rate and rhythm, no murmur appreciated Abd soft, nontender, positive bowel sounds MSK no focal spinal tenderness, no joint edema Neuro: non-focal, well-oriented, appropriate affect Breasts: Deferred   Lab Results  Component Value Date   WBC 6.1 02/16/2021   HGB 12.6 (L) 02/16/2021   HCT 40.6 02/16/2021   MCV 91.2 02/16/2021   PLT 198 02/16/2021   Lab Results  Component Value Date   FERRITIN 102 11/18/2020   IRON 83 11/18/2020   TIBC 348 11/18/2020   UIBC 264 11/18/2020   IRONPCTSAT 24 11/18/2020   Lab Results  Component Value Date   RETICCTPCT 2.0 02/16/2021   RBC 4.48 02/16/2021   RBC 4.45 02/16/2021    No results found for: KPAFRELGTCHN, LAMBDASER, KAPLAMBRATIO No results found for: IGGSERUM, IGA, IGMSERUM No results found for: Marda Stalker, SPEI   Chemistry      Component Value Date/Time   NA 142 11/18/2020 1129   NA 140 07/08/2018 0926   K 5.9 (H) 11/18/2020 1129   CL 107 11/18/2020 1129   CO2 30 11/18/2020 1129   BUN 49 (H) 11/18/2020 1129   BUN 27 07/08/2018 0926   CREATININE 1.19 11/18/2020 1129      Component Value Date/Time   CALCIUM 9.7 11/18/2020 1129   ALKPHOS 58 11/18/2020 1129   AST  33 11/18/2020 1129   ALT 59 (H) 11/18/2020 1129   BILITOT 0.4 11/18/2020 1129       Impression and Plan: Alec Taylor is a very pleasant 75 yo caucasian gentleman with history of iron deficiency anemia secondary to malabsorption after gastric bypass. Iron studies are pending. We will replace if needed.  Follow-up in 4 months.  He was encouraged to contact our office with any questions or concerns.   Emeline Gins, NP 3/10/202211:38 AM

## 2021-02-17 LAB — IRON AND TIBC
Iron: 63 ug/dL (ref 42–163)
Saturation Ratios: 19 % — ABNORMAL LOW (ref 20–55)
TIBC: 338 ug/dL (ref 202–409)
UIBC: 275 ug/dL (ref 117–376)

## 2021-02-17 LAB — FERRITIN: Ferritin: 102 ng/mL (ref 24–336)

## 2021-02-20 ENCOUNTER — Other Ambulatory Visit: Payer: Self-pay

## 2021-02-20 ENCOUNTER — Inpatient Hospital Stay: Payer: Medicare Other

## 2021-02-20 VITALS — BP 116/53 | HR 64 | Temp 98.2°F | Resp 17

## 2021-02-20 DIAGNOSIS — D509 Iron deficiency anemia, unspecified: Secondary | ICD-10-CM | POA: Diagnosis not present

## 2021-02-20 MED ORDER — SODIUM CHLORIDE 0.9 % IV SOLN
INTRAVENOUS | Status: DC
  Filled 2021-02-20: qty 250

## 2021-02-20 MED ORDER — SODIUM CHLORIDE 0.9 % IV SOLN
200.0000 mg | Freq: Once | INTRAVENOUS | Status: AC
  Administered 2021-02-20: 200 mg via INTRAVENOUS
  Filled 2021-02-20: qty 200

## 2021-02-20 NOTE — Patient Instructions (Signed)

## 2021-02-27 ENCOUNTER — Other Ambulatory Visit: Payer: Self-pay

## 2021-02-27 ENCOUNTER — Inpatient Hospital Stay: Payer: Medicare Other

## 2021-02-27 VITALS — BP 122/60 | HR 69 | Temp 98.2°F | Resp 17

## 2021-02-27 DIAGNOSIS — D509 Iron deficiency anemia, unspecified: Secondary | ICD-10-CM

## 2021-02-27 MED ORDER — SODIUM CHLORIDE 0.9 % IV SOLN
INTRAVENOUS | Status: DC
  Filled 2021-02-27: qty 250

## 2021-02-27 MED ORDER — IRON SUCROSE 20 MG/ML IV SOLN
200.0000 mg | Freq: Once | INTRAVENOUS | Status: AC
  Administered 2021-02-27: 200 mg via INTRAVENOUS
  Filled 2021-02-27: qty 200

## 2021-02-27 NOTE — Patient Instructions (Signed)

## 2021-03-07 ENCOUNTER — Other Ambulatory Visit (HOSPITAL_COMMUNITY): Payer: Self-pay

## 2021-03-07 MED ORDER — ATORVASTATIN CALCIUM 20 MG PO TABS: 20.0000 mg | ORAL_TABLET | Freq: Every day | ORAL | 0 refills | Status: DC

## 2021-03-17 ENCOUNTER — Other Ambulatory Visit: Payer: Self-pay

## 2021-03-17 ENCOUNTER — Ambulatory Visit (INDEPENDENT_AMBULATORY_CARE_PROVIDER_SITE_OTHER): Payer: Medicare Other | Admitting: Family

## 2021-03-17 VITALS — BP 123/76 | HR 85 | Temp 98.3°F | Resp 16 | Ht 70.0 in | Wt 196.0 lb

## 2021-03-17 DIAGNOSIS — H8111 Benign paroxysmal vertigo, right ear: Secondary | ICD-10-CM

## 2021-03-17 DIAGNOSIS — H8110 Benign paroxysmal vertigo, unspecified ear: Secondary | ICD-10-CM | POA: Insufficient documentation

## 2021-03-17 NOTE — Progress Notes (Signed)
Subjective:   By signing my name below, I, Estrella Deeds, attest that this documentation has been prepared under the direction and in the presence of Sandford Craze 03/17/21    Patient ID: Alec Taylor, male    DOB: 11-27-1946, 75 y.o.   MRN: [id number]  Chief Complaint  Patient presents with  . Dizziness    Complains of dizziness during the day, this has been going on for 2 weeks.     HPI Patient is in today for an office visit. He has episodes of dizziness that happens around 10am-1pm. When these episodes occur he often has to sit down to improve the symptoms. He notes seeing stars while resting. Lying down typically worsens his symptoms. He often times feel like he is about to vomit but never does. However yesterday he vomited after eating breakfast, he contributes this to having low blood glucose levels. He notes that his oxygen stat has improved from low 90% ranges to 91-99%, after taking his prescribed medication. He has no complaints about his blood pressure medications.  BP Readings from Last 3 Encounters:  03/17/21 123/76  02/27/21 122/60  02/20/21 (!) 116/53   Diabetes: this is followed by endocrinology and reports most recent A1C was "6 point something." Lab Results  Component Value Date   HGBA1C 6.9 (H) 07/14/2019    He does have left ear pain. He denies any chest pain, shortness of breath, fever, abdominal pain, cough, chills, sore throat, dysuria, urinary incontinence, back pain,  HA.     Past Medical History:  Diagnosis Date  . CAD (coronary artery disease) 09/2007   s/p CABG approximately 2004-[facility] (Cath - 17 Sep 2007:90% prox LAD with diffuse 85% narrowing to distal segment, 80% prox CXA, RCA: prox 90%, prox-mid 70%, mid 85%, distal 80%, D1 80%; Normal EF 65% --status post CABG--LIMA to LAD, SVG to PDA.    [name] COPD (chronic obstructive pulmonary disease) (HCC)   . Depression   . Diabetes mellitus without complication (HCC)   . Hypertension   . Ischemic  cardiomyopathy   . Pleural effusion   . S/P CABG x 2    CABG--LIMA to LAD, SVG to PDA.      Past Surgical History:  Procedure Laterality Date  . APPENDECTOMY    . CHOLECYSTECTOMY    . CORONARY ARTERY BYPASS GRAFT    . GASTRIC BYPASS    . LEFT HEART CATH AND CORS/GRAFTS ANGIOGRAPHY N/A 04/30/2018   Procedure: LEFT HEART CATH AND CORS/GRAFTS ANGIOGRAPHY;  Surgeon: Marykay Lex, MD;  Location: [facility] INVASIVE CV LAB;  Service: Cardiovascular;  Laterality: N/A;  . RIGHT/LEFT HEART CATH AND CORONARY/GRAFT ANGIOGRAPHY N/A 02/18/2020   Procedure: RIGHT/LEFT HEART CATH AND CORONARY/GRAFT ANGIOGRAPHY;  Surgeon: Dolores Patty, MD;  Location: [facility] INVASIVE CV LAB;  Service: Cardiovascular;  Laterality: N/A;  . TOE AMPUTATION      Family History  Problem Relation Age of Onset  . Hypertension Father     Social History   Socioeconomic History  . Marital status: Legally Separated    Spouse name: Not on file  . Number of children: Not on file  . Years of education: Not on file  . Highest education level: Not on file  Occupational History  . Not on file  Tobacco Use  . Smoking status: Never Smoker  . Smokeless tobacco: Never Used  Vaping Use  . Vaping Use: Never used  Substance and Sexual Activity  . Alcohol use: Yes    Alcohol/week: 2.0 standard  drinks    Types: 2 Cans of beer per week    Comment: occ  . Drug use: Never  . Sexual activity: Not on file  Other Topics Concern  . Not on file  Social History Narrative  . Not on file   Social Determinants of Health   Financial Resource Strain: Not on file  Food Insecurity: Not on file  Transportation Needs: Not on file  Physical Activity: Not on file  Stress: Not on file  Social Connections: Not on file  Intimate Partner Violence: Not on file    Outpatient Medications Prior to Visit  Medication Sig Dispense Refill  . ALPRAZolam (XANAX) 0.25 MG tablet TAKE ONE-HALF TO ONE TABLET BY MOUTH ONE TIME DAILY AS NEEDED FOR ANXIETY  30 tablet 2  . aspirin EC 81 MG tablet Take 81 mg by mouth at bedtime.     [name] atorvastatin (LIPITOR) 20 MG tablet Take 1 tablet (20 mg total) by mouth daily. Please make an appointment for further refills 30 tablet 0  . buPROPion (WELLBUTRIN XL) 150 MG 24 hr tablet Take 1 tablet (150 mg total) by mouth daily. 30 tablet 2  . carvedilol (COREG) 6.25 MG tablet Take 1 tablet (6.25 mg total) by mouth 2 (two) times daily with a meal. 60 tablet 6  . Cholecalciferol (VITAMIN D3) 5000 units CAPS Take 5,000 Units by mouth daily.     [name] COVID-19 ANTIBODY TEST VI COVID-19 test specimen collection  TEST AS DIRECTED TODAY    . DULoxetine (CYMBALTA) 60 MG capsule Take 1 capsule (60 mg total) by mouth daily. 90 capsule 0  . empagliflozin (JARDIANCE) 10 MG TABS tablet Take 1 tablet (10 mg total) by mouth daily. Please make an appointment for refills 90 tablet 0  . finasteride (PROSCAR) 5 MG tablet Take 5 mg by mouth daily.   6  . folic acid (FOLVITE) 1 MG tablet Take 1 mg by mouth daily.  2  . furosemide (LASIX) 20 MG tablet Take 1 tablet (20 mg total) by mouth every Monday, Wednesday, and Friday. 39 tablet 3  . glimepiride (AMARYL) 2 MG tablet Take 2 mg by mouth every evening.  1  . metFORMIN (GLUCOPHAGE-XR) 500 MG 24 hr tablet Take 500 mg by mouth 2 (two) times daily.  1  . methotrexate (RHEUMATREX) 2.5 MG tablet Take 12.5 mg by mouth once a week. On Wednesday  3  . Multiple Vitamins-Minerals (MULTIVITAMIN ADULT PO) Take 1 tablet by mouth daily. Centrum silver    . mycophenolate (CELLCEPT) 500 MG tablet Take 1,000 mg by mouth 2 (two) times daily.    [name] ofloxacin (OCUFLOX) 0.3 % ophthalmic solution Place 1 drop into both eyes as needed (Before procedure eye injections). Prior to procedures.    . polyvinyl alcohol (LIQUIFILM TEARS) 1.4 % ophthalmic solution 1 drop as needed.    . sacubitril-valsartan (ENTRESTO) 49-51 MG Take 1 tablet by mouth 2 (two) times daily. 180 tablet 3  . [START ON 03/20/2021] zolpidem  (AMBIEN CR) 12.5 MG CR tablet TAKE ONE TABLET BY MOUTH ONE TIME DAILY AT BEDTIME AS NEEDED FOR SLEEP 30 tablet 2  . cephALEXin (KEFLEX) 500 MG capsule     . cyclobenzaprine (FLEXERIL) 10 MG tablet Take 0.5-1 tablets (5-10 mg total) by mouth 3 (three) times daily as needed for muscle spasms. 21 tablet 0  . pantoprazole (PROTONIX) 40 MG tablet Take 40 mg by mouth daily.  0  . potassium chloride SA (KLOR-CON) 20 MEQ tablet Take 1  tablet (20 mEq total) by mouth 3 (three) times a week. Take 1 tablet ( 20 meq ) by mouth with Furosemide ( Lasix ) 3 Times A Week. 12 tablet 5  . predniSONE (DELTASONE) 20 MG tablet prednisone 20 mg tablet    . spironolactone (ALDACTONE) 25 MG tablet Take 0.5 tablets (12.5 mg total) by mouth daily. 45 tablet 3  . triamcinolone cream (KENALOG) 0.5 % Apply 1 application topically 2 (two) times daily. 30 g 0   No facility-administered medications prior to visit.    Allergies  Allergen Reactions  . Celecoxib Other (See Comments)    hyperkalemia  . Losartan Other (See Comments)    hyperkalemia  . Ramipril Cough         Review of Systems  HENT: Positive for ear pain (left). Negative for congestion.   Respiratory: Negative for shortness of breath.   Gastrointestinal: Positive for vomiting. Negative for blood in stool, constipation, diarrhea and nausea.  Genitourinary: Negative for frequency and urgency.  Neurological: Positive for dizziness.       Objective:    Physical Exam Constitutional:      General: He is not in acute distress.    Appearance: Normal appearance. He is not ill-appearing.  HENT:     Head: Normocephalic and atraumatic.     Right Ear: Tympanic membrane, ear canal and external ear normal.     Left Ear: External ear normal.     Ears:     Comments: Fluid noted in his left ear Eyes:     Extraocular Movements: Extraocular movements intact.     Pupils: Pupils are equal, round, and reactive to light.  Cardiovascular:     Rate and Rhythm:  Normal rate and regular rhythm.     Pulses: Normal pulses.     Heart sounds: Normal heart sounds. No murmur heard.   Pulmonary:     Effort: Pulmonary effort is normal. No respiratory distress.     Breath sounds: Normal breath sounds. No wheezing, rhonchi or rales.  Skin:    General: Skin is warm and dry.  Neurological:     Mental Status: He is alert and oriented to person, place, and time.     Comments: + Lucious Groves Dix maneuver right.  Negative on left  EOM intact, Bilateral UE/LE strength is 5/5  Psychiatric:        Behavior: Behavior normal.     BP 123/76 (BP Location: Right Arm, Patient Position: Sitting, Cuff Size: Small)   Pulse 85   Temp 98.3 F (36.8 C) (Oral)   Resp 16   Ht 5\' 10"  (1.778 m)   Wt 196 lb (88.9 kg)   SpO2 99%   BMI 28.12 kg/m  Wt Readings from Last 3 Encounters:  03/17/21 196 lb (88.9 kg)  02/16/21 195 lb 0.6 oz (88.5 kg)  01/06/21 199 lb (90.3 kg)    Diabetic Foot Exam - Simple   No data filed    Lab Results  Component Value Date   WBC 6.1 02/16/2021   HGB 12.6 (L) 02/16/2021   HCT 40.6 02/16/2021   PLT 198 02/16/2021   GLUCOSE 83 11/18/2020   CHOL 117 07/14/2019   TRIG 137.0 07/14/2019   HDL 39.20 07/14/2019   LDLCALC 50 07/14/2019   ALT 59 (H) 11/18/2020   AST 33 11/18/2020   NA 142 11/18/2020   K 5.9 (H) 11/18/2020   CL 107 11/18/2020   CREATININE 1.19 11/18/2020   BUN 49 (H) 11/18/2020  CO2 30 11/18/2020   TSH 2.630 02/12/2020   INR 1.0 02/12/2020   HGBA1C 6.9 (H) 07/14/2019   MICROALBUR 1.9 07/14/2019    Lab Results  Component Value Date   TSH 2.630 02/12/2020   Lab Results  Component Value Date   WBC 6.1 02/16/2021   HGB 12.6 (L) 02/16/2021   HCT 40.6 02/16/2021   MCV 91.2 02/16/2021   PLT 198 02/16/2021   Lab Results  Component Value Date   NA 142 11/18/2020   K 5.9 (H) 11/18/2020   CO2 30 11/18/2020   GLUCOSE 83 11/18/2020   BUN 49 (H) 11/18/2020   CREATININE 1.19 11/18/2020   BILITOT 0.4 11/18/2020    ALKPHOS 58 11/18/2020   AST 33 11/18/2020   ALT 59 (H) 11/18/2020   PROT 6.9 11/18/2020   ALBUMIN 4.5 11/18/2020   CALCIUM 9.7 11/18/2020   ANIONGAP 5 11/18/2020   GFR 68.08 07/14/2019   Lab Results  Component Value Date   CHOL 117 07/14/2019   Lab Results  Component Value Date   HDL 39.20 07/14/2019   Lab Results  Component Value Date   LDLCALC 50 07/14/2019   Lab Results  Component Value Date   TRIG 137.0 07/14/2019   Lab Results  Component Value Date   CHOLHDL 3 07/14/2019   Lab Results  Component Value Date   HGBA1C 6.9 (H) 07/14/2019       Assessment & Plan:   Problem List Items Addressed This Visit   None   Visit Diagnoses    Benign paroxysmal positional vertigo of right ear    -  Primary   Relevant Orders   Ambulatory referral to Physical Therapy      No orders of the defined types were placed in this encounter.   I, Estrella Deeds, personally preformed the services described in this documentation.  All medical record entries made by the scribe were at my direction and in my presence.  I have reviewed the chart and discharge instructions (if applicable) and agree that the record reflects my personal performance and is accurate and complete. 03/17/21  I,Alexis Bryant,acting as a scribe for Lemont Fillers, NP.,have documented all relevant documentation on the behalf of Lemont Fillers, NP,as directed by  Lemont Fillers, NP while in the presence of Lemont Fillers, NP.  Estrella Deeds

## 2021-03-17 NOTE — Patient Instructions (Addendum)
You should be contacted about scheduling your appointment with vestibular rehab.  Benign Positional Vertigo Vertigo is the feeling that you or your surroundings are moving when they are not. Benign positional vertigo is the most common form of vertigo. This is usually a harmless condition (benign). This condition is positional. This means that symptoms are triggered by certain movements and positions. This condition can be dangerous if it occurs while you are doing something that could cause harm to you or others. This includes activities such as driving or operating machinery. What are the causes? The inner ear has fluid-filled canals that help your brain sense movement and balance. When the fluid moves, the brain receives messages about your body's position. With benign positional vertigo, crystals in the inner ear break free and disturb the inner ear area. This causes your brain to receive confusing messages about your body's position. What increases the risk? You are more likely to develop this condition if:  You are a woman.  You are 73 years of age or older.  You have recently had a head injury.  You have an inner ear disease. What are the signs or symptoms? Symptoms of this condition usually happen when you move your head or your eyes in different directions. Symptoms may start suddenly, and usually last for less than a minute. They include:  Loss of balance and falling.  Feeling like you are spinning or moving.  Feeling like your surroundings are spinning or moving.  Nausea and vomiting.  Blurred vision.  Dizziness.  Involuntary eye movement (nystagmus). Symptoms can be mild and cause only minor problems, or they can be severe and interfere with daily life. Episodes of benign positional vertigo may return (recur) over time. Symptoms may improve over time. How is this diagnosed? This condition may be diagnosed based on:  Your medical history.  Physical exam of the head,  neck, and ears.  Positional tests to check for or stimulate vertigo. You may be asked to turn your head and change positions, such as going from sitting to lying down. A health care provider will watch for symptoms of vertigo. You may be referred to a health care provider who specializes in ear, nose, and throat problems (ENT, or otolaryngologist) or a provider who specializes in disorders of the nervous system (neurologist). How is this treated? This condition may be treated in a session in which your health care provider moves your head in specific positions to help the displaced crystals in your inner ear move. Treatment for this condition may take several sessions. Surgery may be needed in severe cases, but this is rare. In some cases, benign positional vertigo may resolve on its own in 2-4 weeks.   Follow these instructions at home: Safety  Move slowly. Avoid sudden body or head movements or certain positions, as told by your health care provider.  Avoid driving until your health care provider says it is safe for you to do so.  Avoid operating heavy machinery until your health care provider says it is safe for you to do so.  Avoid doing any tasks that would be dangerous to you or others if vertigo occurs.  If you have trouble walking or keeping your balance, try using a cane for stability. If you feel dizzy or unstable, sit down right away.  Return to your normal activities as told by your health care provider. Ask your health care provider what activities are safe for you. General instructions  Take over-the-counter and prescription medicines  only as told by your health care provider.  Drink enough fluid to keep your urine pale yellow.  Keep all follow-up visits as told by your health care provider. This is important. Contact a health care provider if:  You have a fever.  Your condition gets worse or you develop new symptoms.  Your family or friends notice any behavioral  changes.  You have nausea or vomiting that gets worse.  You have numbness or a prickling and tingling sensation. Get help right away if you:  Have difficulty speaking or moving.  Are always dizzy.  Faint.  Develop severe headaches.  Have weakness in your legs or arms.  Have changes in your hearing or vision.  Develop a stiff neck.  Develop sensitivity to light. Summary  Vertigo is the feeling that you or your surroundings are moving when they are not. Benign positional vertigo is the most common form of vertigo.  This condition is caused by crystals in the inner ear that become displaced. This causes a disturbance in an area of the inner ear that helps your brain sense movement and balance.  Symptoms include loss of balance and falling, feeling that you or your surroundings are moving, nausea and vomiting, and blurred vision.  This condition can be diagnosed based on symptoms, a physical exam, and positional tests.  Follow safety instructions as told by your health care provider. You will also be told when to contact your health care provider in case of problems. This information is not intended to replace advice given to you by your health care provider. Make sure you discuss any questions you have with your health care provider. Document Revised: 10/20/2019 Document Reviewed: 05/07/2018 Elsevier Patient Education  2021 [employer].

## 2021-03-17 NOTE — Assessment & Plan Note (Addendum)
New. Case discussed with pt's PCP. Will refer for PT Vestibular rehab.  I have also asked him to keep track of his blood pressure, sugar heart rate when he has dizzy spells and let us know if any of these measures run low.  Would like to try to avoid meclizine due to his age if possible. Discussed standing slowly to decrease risk of falls.

## 2021-03-20 ENCOUNTER — Telehealth: Payer: Self-pay | Admitting: *Deleted

## 2021-03-20 NOTE — Telephone Encounter (Signed)
Caller Name Alec Taylor Relationship To Patient Spouse Return Phone Number [phone] (Primary) Chief Complaint Dizziness Reason for Call Symptomatic / Request for Health Information Initial Comment Caller states he is having constant dizziness. He is wanting to make an appt but they want to have him triaged first. He is also experiencing new symptoms which are nausea and vomiting. Translation No Nurse Assessment Nurse: Henri Medal, RN, Amy Date/Time (Eastern Time): 03/17/2021 3:50:02 PM Confirm and document reason for call. If symptomatic, describe symptoms. ---Caller states he is having constant dizziness. It will pass in about 30 minutes or so, but it is happening every day. He had it before, & his doctor adjusted his BP meds, but this is different. He is wanting to make an appt. but they want to have him triaged first. He is also experiencing new symptoms which are nausea and vomiting. He vomited yesterday morning at 11AM. Over the last 6 months he has done it a few times. He never used to throw up at all before he was 52. It seems to happen to where he gets really dizzy like his head is spinning. Sometimes he will get white flecks or stars in his vision & will start feeling weird in his body, like the feeling you get when you have low blood sugar.

## 2021-03-20 NOTE — Telephone Encounter (Signed)
Patient has not scheduled appointment yet.  Please call to schedule.

## 2021-03-20 NOTE — Telephone Encounter (Signed)
Called the patient and he was seen on 03/17/21 by Sandford Craze. She has put in a referral for him to have PT. The patient is doing a little better today and will call to schedule soon a followup with PCP.

## 2021-03-21 ENCOUNTER — Encounter: Payer: Self-pay | Admitting: Physical Therapy

## 2021-03-21 ENCOUNTER — Other Ambulatory Visit: Payer: Self-pay

## 2021-03-21 ENCOUNTER — Ambulatory Visit: Payer: Medicare Other | Attending: Family | Admitting: Physical Therapy

## 2021-03-21 DIAGNOSIS — H8111 Benign paroxysmal vertigo, right ear: Secondary | ICD-10-CM | POA: Diagnosis not present

## 2021-03-21 DIAGNOSIS — R42 Dizziness and giddiness: Secondary | ICD-10-CM

## 2021-03-21 DIAGNOSIS — R2681 Unsteadiness on feet: Secondary | ICD-10-CM | POA: Diagnosis present

## 2021-03-21 NOTE — Therapy (Signed)
[facility] [address]  [address] [place], [place], [address] Phone: [phone]   Fax:  [phone]  Physical Therapy Evaluation  Patient Details  Name: Alec Taylor MRN: [id number] Date of Birth: 09-28-1946 Referring Provider (PT): Sandford Craze, NP   Encounter Date: 03/21/2021   PT End of Session - 03/21/21 1145    Visit Number 1    Number of Visits 13    Date for PT Re-Evaluation 05/02/21    Authorization Type Medicare & Aetna    PT Start Time 1017    PT Stop Time 1059    PT Time Calculation (min) 42 min    Activity Tolerance Patient tolerated treatment well    Behavior During Therapy [facility] for tasks assessed/performed           Past Medical History:  Diagnosis Date  . CAD (coronary artery disease) 09/2007   s/p CABG approximately 2004-HP (Cath - 17 Sep 2007:90% prox LAD with diffuse 85% narrowing to distal segment, 80% prox CXA, RCA: prox 90%, prox-mid 70%, mid 85%, distal 80%, D1 80%; Normal EF 65% --status post CABG--LIMA to LAD, SVG to PDA.    [name] COPD (chronic obstructive pulmonary disease) (HCC)   . Depression   . Diabetes mellitus without complication (HCC)   . Hypertension   . Ischemic cardiomyopathy   . Pleural effusion   . S/P CABG x 2    CABG--LIMA to LAD, SVG to PDA.      Past Surgical History:  Procedure Laterality Date  . APPENDECTOMY    . CHOLECYSTECTOMY    . CORONARY ARTERY BYPASS GRAFT    . GASTRIC BYPASS    . LEFT HEART CATH AND CORS/GRAFTS ANGIOGRAPHY N/A 04/30/2018   Procedure: LEFT HEART CATH AND CORS/GRAFTS ANGIOGRAPHY;  Surgeon: Marykay Lex, MD;  Location: [facility] INVASIVE CV LAB;  Service: Cardiovascular;  Laterality: N/A;  . RIGHT/LEFT HEART CATH AND CORONARY/GRAFT ANGIOGRAPHY N/A 02/18/2020   Procedure: RIGHT/LEFT HEART CATH AND CORONARY/GRAFT ANGIOGRAPHY;  Surgeon: Dolores Patty, MD;  Location: [facility] INVASIVE CV LAB;  Service: Cardiovascular;  Laterality: N/A;  . TOE  AMPUTATION      There were no vitals filed for this visit.    Subjective Assessment - 03/21/21 1019    Subjective Patient reports dizziness consistently for the past 2 weeks, however noting intermittent dizziness for several years. Believes it is d/ his meds. Dizziness lasts 30 minutes and describes it as "someone is squeezing my head," "seeing stars" and "spinning." Worse when looking up, bending down while playing golf. Denies dizziness with bed mobility. Denies recent head trauma, infection/illness, change in meds around the time of onset. Notes L ear pain for the last year, denies otorrhea, tinnitus, hearing loss.    Pertinent History CABG x2, HTN, DM, depression, COPD, CAD, toe amputation    Limitations Standing;Walking;House hold activities    Diagnostic tests none recent    Patient Stated Goals decrease dizziness    Currently in Pain? No/denies              [facility] PT Assessment - 03/21/21 1024      Assessment   Medical Diagnosis R ear BPPV    Referring Provider (PT) Sandford Craze, NP    Onset Date/Surgical Date 03/07/21    Prior Therapy no      Precautions   Precautions None      Balance Screen   Has the patient fallen in the past 6 months Yes  How many times? 3   standing on a boat 2x, fell back on steps 1x   Has the patient had a decrease in activity level because of a fear of falling?  No    Is the patient reluctant to leave their home because of a fear of falling?  No      Home Tourist information centre manager residence    Living Arrangements Spouse/significant other    Available Help at Discharge Family    Type of Home House    Home Access Level entry    Home Layout Two level   avoids going upstairs d/t fear of falling   Home Equipment None      Prior Function   Level of Independence Independent    Vocation Retired    Leisure golf                  Vestibular Assessment - 03/21/21 1027      Oculomotor Exam   Oculomotor Alignment  Normal    Ocular ROM WFL    Spontaneous Absent    Smooth Pursuits Saccades   to R/L; mild dizziness   Saccades Undershoots   to L; mild dizziness     Oculomotor Exam-Fixation Suppressed    Left Head Impulse intact   testing limited d/t muscle guarding   Right Head Impulse intact   testing limited d/t muscle guarding     Vestibulo-Ocular Reflex   VOR 1 Head Only (x 1 viewing) c/o dizziness with horizontal VOR with more trouble fixating w/ R head rotation; c/o mild dizziness with vertical VOR    VOR Cancellation Corrective saccades   no dizziness     Positional Testing   Sidelying Test Sidelying Right;Sidelying Left      Sidelying Right   Sidelying Right Duration 10 sec    Sidelying Right Symptoms Upbeat, right rotatory nystagmus   c/o dizziness     Sidelying Left   Sidelying Left Duration 0    Sidelying Left Symptoms No nystagmus              Objective measurements completed on examination: See above findings.        Vestibular Treatment/Exercise - 03/21/21 0001      Vestibular Treatment/Exercise   Vestibular Treatment Provided Canalith Repositioning    Canalith Repositioning Epley Manuever Right       EPLEY MANUEVER RIGHT   Number of Reps  2    Overall Response Improved Symptoms    Response Details  tolerated well                 PT Education - 03/21/21 1145    Education Details prognosis, POC, edu on BPPV and its risk factors, post epley instructions    Person(s) Educated Patient    Methods Explanation;Demonstration;Tactile cues;Verbal cues;Handout    Comprehension Verbalized understanding            PT Short Term Goals - 03/21/21 1201      PT SHORT TERM GOAL #1   Title Patient to report understanding and compliance with post-epley instructions.    Time 2    Period Weeks    Status New    Target Date 04/04/21             PT Long Term Goals - 03/21/21 1201      PT LONG TERM GOAL #1   Title Patient to be independent with vestibular  HEP.    Time 6  Period Weeks    Status New    Target Date 05/02/21      PT LONG TERM GOAL #2   Title Patient to report no dizziness with positional testing.    Time 6    Period Weeks    Status New    Target Date 05/02/21      PT LONG TERM GOAL #3   Title Patient to ascend/descend 13 stairs with 1 handrail as needed with good stability.    Time 6    Period Weeks    Status New    Target Date 05/02/21      PT LONG TERM GOAL #4   Title Patient to return to golf without dizziness limiting.    Time 6    Period Weeks    Status New    Target Date 05/02/21                  Plan - 03/21/21 1147    Clinical Impression Statement Patient is a 74y/o M presenting to OPPT with c/o recently worsening dizziness of 2 weeks duration. Dizziness lasts minutes and describes it as "someone is squeezing my head," "seeing stars" and "spinning." Aggravating factors include looking up, bending down while playing golf. Denies dizziness with bed mobility. Notes L ear pain for the last year, denies otorrhea, tinnitus, hearing loss. Oculomotor exam today revealed undershooting with R/L saccadic testing, saccades with R/L smooth pursuits, dizziness with vertical and horizontal VOR, and corrective saccades with VOR cancellation. Patient with positive R sidelying test for dizziness and nystagmus. Patient was treated with R Epley maneuver x2 which he tolerated well. Administered post-Epley instructions to be followed x 1 night. Patient reported understanding and without complaints at end of session. Would benefit from skilled PT services 1-2x/week for 6 weeks to address aforementioned impairments.    Personal Factors and Comorbidities Age;Comorbidity 3+;Fitness;Past/Current Experience;Time since onset of injury/illness/exacerbation    Comorbidities CABG x2, HTN, DM, depression, COPD, CAD, toe amputation    Examination-Activity Limitations Bend;Squat;Stand;Transfers;Dressing;Hygiene/Grooming;Lift;Locomotion  Level;Reach Overhead;Stairs    Examination-Participation Restrictions Church;Cleaning;Shop;Yard Work;Laundry;Meal Prep    Stability/Clinical Decision Making Stable/Uncomplicated    Clinical Decision Making Low    Rehab Potential Good    PT Frequency --   1-2x/week   PT Duration 6 weeks    PT Treatment/Interventions ADLs/Self Care Home Management;Canalith Repostioning;Cryotherapy;Moist Heat;Balance training;Therapeutic exercise;Therapeutic activities;Functional mobility training;Stair training;Gait training;Neuromuscular re-education;Patient/family education;Vestibular;Energy conservation;Passive range of motion    PT Next Visit Plan reassess R sidelying test    Consulted and Agree with Plan of Care Patient           Patient will benefit from skilled therapeutic intervention in order to improve the following deficits and impairments:  Decreased activity tolerance,Decreased balance,Difficulty walking,Improper body mechanics,Dizziness,Decreased range of motion,Impaired flexibility,Postural dysfunction  Visit Diagnosis: BPPV (benign paroxysmal positional vertigo), right  Dizziness and giddiness  Unsteadiness on feet     Problem List Patient Active Problem List   Diagnosis Date Noted  . BPPV (benign paroxysmal positional vertigo) 03/17/2021  . Microcytic anemia 02/22/2020  . Generalized anxiety disorder 09/24/2018  . Major depressive disorder, single episode, unspecified 09/24/2018  . Abrasion of great toe, left 07/03/2018  . Chronic systolic heart failure (HCC) 06/13/2018  . Hyperlipidemia LDL goal <70 05/07/2018  . Essential (primary) hypertension 05/07/2018  . Ischemic cardiomyopathy 05/01/2018  . Pleural effusion 04/29/2018  . Demand ischemia (HCC) 04/29/2018  . Prolonged QT interval 04/29/2018  . Diabetes mellitus type 2 in nonobese (HCC) 04/29/2018  .  Anxiety and depression 04/29/2018  . CAD (coronary artery disease) 04/29/2018  . Acute systolic CHF (congestive heart  failure) (HCC) 04/28/2018  . Normocytic anemia 04/21/2018  . Multiple pulmonary nodules 12/25/2017  . Callus of foot 10/23/2017  . Metatarsalgia of both feet 10/23/2017  . Hx of CABG 04/28/2017  . Plantar fat pad atrophy of left foot 12/11/2016  . Hallux rigidus of left foot 09/11/2016  . Diabetic toe ulcer (HCC) 05/16/2016  . Contracture of left Achilles tendon 01/18/2016  . Hyperlipidemia LDL goal <100 05/12/2015  . Iron deficiency anemia 07/07/2014  . Major depressive disorder, recurrent episode (HCC) 07/07/2014  . Microalbuminuria due to type 2 diabetes mellitus (HCC) 07/07/2014  . Osteoarthritis 07/07/2014  . Peripheral vascular disease (HCC) 07/07/2014  . Polyneuropathy in diabetes (HCC) 07/07/2014  . Preproliferative diabetic retinopathy (HCC) 07/07/2014  . Testicular hypofunction 07/07/2014  . Status post amputation of foot (HCC) 07/07/2014  . Type 2 diabetes mellitus without complication (HCC) 07/07/2014  . Esophageal reflux 07/06/2014  . Fatty (change of) liver, not elsewhere classified 07/06/2014  . History of colon polyps 07/06/2014  . History of gastric bypass 07/06/2014  . Insomnia 07/06/2014  . Other acquired hammer toe 07/06/2014  . Coronary atherosclerosis 07/06/2014  . Essential hypertension 07/06/2014     Anette Guarneri, PT, DPT 03/21/21 12:04 PM   [facility] [address]  [address] [place], [place], [address] Phone: [phone]   Fax:  [phone]  Name: Younes Degeorge MRN: [id number] Date of Birth: 1946-04-28

## 2021-03-27 ENCOUNTER — Other Ambulatory Visit: Payer: Self-pay

## 2021-03-27 ENCOUNTER — Encounter: Payer: Self-pay | Admitting: Physical Therapy

## 2021-03-27 ENCOUNTER — Ambulatory Visit: Payer: Medicare Other | Admitting: Physical Therapy

## 2021-03-27 DIAGNOSIS — H8111 Benign paroxysmal vertigo, right ear: Secondary | ICD-10-CM

## 2021-03-27 DIAGNOSIS — R42 Dizziness and giddiness: Secondary | ICD-10-CM

## 2021-03-27 DIAGNOSIS — R2681 Unsteadiness on feet: Secondary | ICD-10-CM

## 2021-03-27 NOTE — Therapy (Addendum)
[facility] [address]  [address] [place], [place], [address] Phone: [phone]   Fax:  [phone]  Physical Therapy Treatment  Patient Details  Name: Alec Taylor MRN: [id number] Date of Birth: 1946-05-15 Referring Provider (PT): Sandford Craze, NP   Encounter Date: 03/27/2021   PT End of Session - 03/27/21 0929    Visit Number 2    Number of Visits 13    Date for PT Re-Evaluation 05/02/21    Authorization Type Medicare & Aetna    PT Start Time 0848    PT Stop Time 0927    PT Time Calculation (min) 39 min    Activity Tolerance Patient tolerated treatment well    Behavior During Therapy [facility] for tasks assessed/performed           Past Medical History:  Diagnosis Date  . CAD (coronary artery disease) 09/2007   s/p CABG approximately 2004-HP (Cath - 17 Sep 2007:90% prox LAD with diffuse 85% narrowing to distal segment, 80% prox CXA, RCA: prox 90%, prox-mid 70%, mid 85%, distal 80%, D1 80%; Normal EF 65% --status post CABG--LIMA to LAD, SVG to PDA.    [name] COPD (chronic obstructive pulmonary disease) (HCC)   . Depression   . Diabetes mellitus without complication (HCC)   . Hypertension   . Ischemic cardiomyopathy   . Pleural effusion   . S/P CABG x 2    CABG--LIMA to LAD, SVG to PDA.      Past Surgical History:  Procedure Laterality Date  . APPENDECTOMY    . CHOLECYSTECTOMY    . CORONARY ARTERY BYPASS GRAFT    . GASTRIC BYPASS    . LEFT HEART CATH AND CORS/GRAFTS ANGIOGRAPHY N/A 04/30/2018   Procedure: LEFT HEART CATH AND CORS/GRAFTS ANGIOGRAPHY;  Surgeon: Marykay Lex, MD;  Location: [facility] INVASIVE CV LAB;  Service: Cardiovascular;  Laterality: N/A;  . RIGHT/LEFT HEART CATH AND CORONARY/GRAFT ANGIOGRAPHY N/A 02/18/2020   Procedure: RIGHT/LEFT HEART CATH AND CORONARY/GRAFT ANGIOGRAPHY;  Surgeon: Dolores Patty, MD;  Location: [facility] INVASIVE CV LAB;  Service: Cardiovascular;  Laterality: N/A;  . TOE AMPUTATION       Vitals:     03/27/21 8:57 AM  03/27/21 9:00 AM  03/27/21 9:04 AM    Orthostatic BP  130/62  122/55  115/51   BP Location  LeftArm  LeftArm  LeftArm   Patient Position  Supine  Sitting  Standing        Subjective Assessment - 03/27/21 0849    Subjective Did not experience dizziness until 10-11am over the weekend. Still noticing dizziness during golf when he is teeing the ball- feels more like lightheadedness. Was able to follow post-epley instructions. No dizziness currently.    Pertinent History CABG x2, HTN, DM, depression, COPD, CAD, toe amputation    Diagnostic tests none recent    Patient Stated Goals decrease dizziness    Currently in Pain? No/denies                   Vestibular Assessment - 03/27/21 0001      Positional Testing   Dix-Hallpike Dix-Hallpike Right      Dix-Hallpike Right   Dix-Hallpike Right Duration 10    Dix-Hallpike Right Symptoms Upbeat, right rotatory nystagmus   dizziness; less on 2nd trail     Sidelying Right   Sidelying Right Duration 0    Sidelying Right Symptoms No nystagmus   no dizziness     Sidelying Left  Sidelying Left Duration 0    Sidelying Left Symptoms No nystagmus                     Vestibular Treatment/Exercise - 03/27/21 0001       EPLEY MANUEVER RIGHT   Number of Reps  2    Overall Response Improved Symptoms    Response Details  tolerated well   improved positioning acheived 2nd trial                PT Education - 03/27/21 0929    Education Details review of post-epley instructions and HEP; edu on proper hydration and use of ankle pumps to address lob BP    Person(s) Educated Patient    Methods Explanation;Demonstration;Verbal cues;Tactile cues;Handout    Comprehension Verbalized understanding;Returned demonstration            PT Short Term Goals - 03/27/21 0946      PT SHORT TERM GOAL #1   Title Patient to report understanding and compliance with post-epley  instructions.    Time 2    Period Weeks    Status Achieved    Target Date 04/04/21             PT Long Term Goals - 03/27/21 0946      PT LONG TERM GOAL #1   Title Patient to be independent with vestibular HEP.    Time 6    Period Weeks    Status On-going      PT LONG TERM GOAL #2   Title Patient to report no dizziness with positional testing.    Time 6    Period Weeks    Status On-going      PT LONG TERM GOAL #3   Title Patient to ascend/descend 13 stairs with 1 handrail as needed with good stability.    Time 6    Period Weeks    Status On-going      PT LONG TERM GOAL #4   Title Patient to return to golf without dizziness limiting.    Time 6    Period Weeks    Status On-going                 Plan - 03/27/21 0944    Clinical Impression Statement Patient arrived to session with report of delay in dizziness until later during his golf game yesterday. Notes more sensation of lightheadedness rather than dizziness when teeing the ball during golf. R and L sidelying test was negative for nystagmus and dizziness. D/t patient's c/o dizziness, took orthostatics. Patient did demonstrate drop in BP with each position, but not enough to be truly considered orthostatic. Assessed R DH, which was again positive for dizziness and R upbeating torsional nystagmus. Tolerated R epley maneuver x2 without complaints. Initially had difficulty with proper positioning on position 3, which was better-achieved during 2nd trial. Dizziness and nystagmus was also less prominent on 2nd trial of DH. Updated HEP with R Austin Miles- patient reported understanding and without complaints at end of session.    Comorbidities CABG x2, HTN, DM, depression, COPD, CAD, toe amputation    PT Treatment/Interventions ADLs/Self Care Home Management;Canalith Repostioning;Cryotherapy;Moist Heat;Balance training;Therapeutic exercise;Therapeutic activities;Functional mobility training;Stair training;Gait  training;Neuromuscular re-education;Patient/family education;Vestibular;Energy conservation;Passive range of motion    PT Next Visit Plan reassess R DH    Consulted and Agree with Plan of Care Patient           Patient will benefit from skilled therapeutic intervention in  order to improve the following deficits and impairments:  Decreased activity tolerance,Decreased balance,Difficulty walking,Improper body mechanics,Dizziness,Decreased range of motion,Impaired flexibility,Postural dysfunction  Visit Diagnosis: BPPV (benign paroxysmal positional vertigo), right  Dizziness and giddiness  Unsteadiness on feet     Problem List Patient Active Problem List   Diagnosis Date Noted  . BPPV (benign paroxysmal positional vertigo) 03/17/2021  . Microcytic anemia 02/22/2020  . Generalized anxiety disorder 09/24/2018  . Major depressive disorder, single episode, unspecified 09/24/2018  . Abrasion of great toe, left 07/03/2018  . Chronic systolic heart failure (HCC) 06/13/2018  . Hyperlipidemia LDL goal <70 05/07/2018  . Essential (primary) hypertension 05/07/2018  . Ischemic cardiomyopathy 05/01/2018  . Pleural effusion 04/29/2018  . Demand ischemia (HCC) 04/29/2018  . Prolonged QT interval 04/29/2018  . Diabetes mellitus type 2 in nonobese (HCC) 04/29/2018  . Anxiety and depression 04/29/2018  . CAD (coronary artery disease) 04/29/2018  . Acute systolic CHF (congestive heart failure) (HCC) 04/28/2018  . Normocytic anemia 04/21/2018  . Multiple pulmonary nodules 12/25/2017  . Callus of foot 10/23/2017  . Metatarsalgia of both feet 10/23/2017  . Hx of CABG 04/28/2017  . Plantar fat pad atrophy of left foot 12/11/2016  . Hallux rigidus of left foot 09/11/2016  . Diabetic toe ulcer (HCC) 05/16/2016  . Contracture of left Achilles tendon 01/18/2016  . Hyperlipidemia LDL goal <100 05/12/2015  . Iron deficiency anemia 07/07/2014  . Major depressive disorder, recurrent episode (HCC)  07/07/2014  . Microalbuminuria due to type 2 diabetes mellitus (HCC) 07/07/2014  . Osteoarthritis 07/07/2014  . Peripheral vascular disease (HCC) 07/07/2014  . Polyneuropathy in diabetes (HCC) 07/07/2014  . Preproliferative diabetic retinopathy (HCC) 07/07/2014  . Testicular hypofunction 07/07/2014  . Status post amputation of foot (HCC) 07/07/2014  . Type 2 diabetes mellitus without complication (HCC) 07/07/2014  . Esophageal reflux 07/06/2014  . Fatty (change of) liver, not elsewhere classified 07/06/2014  . History of colon polyps 07/06/2014  . History of gastric bypass 07/06/2014  . Insomnia 07/06/2014  . Other acquired hammer toe 07/06/2014  . Coronary atherosclerosis 07/06/2014  . Essential hypertension 07/06/2014    Tyrone Sage 03/27/2021, 10:16 AM  [facility] [address]  [address] [place], [place], [address] Phone: [phone]   Fax:  [phone]  Name: Alec Taylor MRN: [id number] Date of Birth: [birth year]

## 2021-04-03 ENCOUNTER — Ambulatory Visit: Payer: Medicare Other | Admitting: Physical Therapy

## 2021-04-03 ENCOUNTER — Other Ambulatory Visit: Payer: Self-pay

## 2021-04-03 ENCOUNTER — Ambulatory Visit (INDEPENDENT_AMBULATORY_CARE_PROVIDER_SITE_OTHER): Payer: Medicare Other | Admitting: Psychiatry

## 2021-04-03 ENCOUNTER — Encounter: Payer: Self-pay | Admitting: Psychiatry

## 2021-04-03 ENCOUNTER — Encounter: Payer: Self-pay | Admitting: Physical Therapy

## 2021-04-03 VITALS — Wt 195.0 lb

## 2021-04-03 DIAGNOSIS — R2681 Unsteadiness on feet: Secondary | ICD-10-CM

## 2021-04-03 DIAGNOSIS — F3341 Major depressive disorder, recurrent, in partial remission: Secondary | ICD-10-CM

## 2021-04-03 DIAGNOSIS — H8111 Benign paroxysmal vertigo, right ear: Secondary | ICD-10-CM

## 2021-04-03 DIAGNOSIS — R42 Dizziness and giddiness: Secondary | ICD-10-CM

## 2021-04-03 MED ORDER — DULOXETINE HCL 30 MG PO CPEP: 30.0000 mg | ORAL_CAPSULE | Freq: Every day | ORAL | 0 refills | Status: DC

## 2021-04-03 NOTE — Therapy (Signed)
[facility] [address]  [address] [place], [place], [address] Phone: [phone]   Fax:  [phone]  Physical Therapy Treatment  Patient Details  Name: Alec Taylor MRN: [id number] Date of Birth: 05/09/1946 Referring Provider (PT): Sandford Craze, NP   Encounter Date: 04/03/2021   PT End of Session - 04/03/21 1450    Visit Number 3    Number of Visits 13    Date for PT Re-Evaluation 05/02/21    Authorization Type Medicare & Aet[employer]    PT Start Time 1401    PT Stop Time 1444    PT Time Calculation (min) 43 min    Activity Tolerance Patient tolerated treatment well    Behavior During Therapy [facility] for tasks assessed/performed           Past Medical History:  Diagnosis Date  . CAD (coronary artery disease) 09/2007   s/p CABG approximately 2004[facility] (Cath - 17 Sep 2007:90% prox LAD with diffuse 85% narrowing to distal segment, 80% prox CXA, RCA: prox 90%, prox-mid 70%, mid 85%, distal 80%, D1 80%; Normal EF 65% --status post CABG--LIMA to LAD, SVG to PDA.    [name] COPD (chronic obstructive pulmonary disease) (HCC)   . Depression   . Diabetes mellitus without complication (HCC)   . Hypertension   . Ischemic cardiomyopathy   . Pleural effusion   . S/P CABG x 2    CABG--LIMA to LAD, SVG to PDA.      Past Surgical History:  Procedure Laterality Date  . APPENDECTOMY    . CHOLECYSTECTOMY    . CORONARY ARTERY BYPASS GRAFT    . GASTRIC BYPASS    . LEFT HEART CATH AND CORS/GRAFTS ANGIOGRAPHY N/A 04/30/2018   Procedure: LEFT HEART CATH AND CORS/GRAFTS ANGIOGRAPHY;  Surgeon: Marykay Lex, MD;  Location: [facility] INVASIVE CV LAB;  Service: Cardiovascular;  Laterality: N/A;  . RIGHT/LEFT HEART CATH AND CORONARY/GRAFT ANGIOGRAPHY N/A 02/18/2020   Procedure: RIGHT/LEFT HEART CATH AND CORONARY/GRAFT ANGIOGRAPHY;  Surgeon: Dolores Patty, MD;  Location: [facility] INVASIVE CV LAB;  Service: Cardiovascular;  Laterality: N/A;  . TOE AMPUTATION       There were no vitals filed for this visit.   Subjective Assessment - 04/03/21 1403    Subjective Dizziness is better- still experiences it with golf. Denies dizziness with bed mobility. Denies dizziness at rest. Notes 80% improvemen tin dizziness since initial eval.    Pertinent History CABG x2, HTN, DM, depression, COPD, CAD, toe amputation    Diagnostic tests none recent    Patient Stated Goals decrease dizziness    Currently in Pain? No/denies                   Vestibular Assessment - 04/03/21 0001      Positional Testing   Dix-Hallpike Dix-Hallpike Right      Dix-Hallpike Right   Dix-Hallpike Right Duration 15    Dix-Hallpike Right Symptoms Upbeat, right rotatory nystagmus   x2                   [facility] Adult PT Treatment/Exercise - 04/03/21 0001      Ambulation/Gait   Stairs Yes    Stairs Assistance 7: Independent    Stair Management Technique One rail Right;One rail Left;Alternating pattern    Number of Stairs 13    Height of Stairs 8           Vestibular Treatment/Exercise - 04/03/21 0001  Vestibular Treatment/Exercise   Habituation Exercises Comment   sitting R/L anterior canal habituation 10sec each; additional L side with EC 10x sec; walking to collect cones/bean bags from floor- c/o 3-4/10 dizziness      EPLEY MANUEVER RIGHT   Number of Reps  2    Overall Response Improved Symptoms    Response Details  c/o mild dizziness upon sitting trial 1; no dizziness trial 2   2nd trial with vibration over the R mastoid duriing position 1 and 3                PT Education - 04/03/21 1449    Education Details update to HEP    Person(s) Educated Patient    Methods Explanation;Demonstration;Tactile cues;Verbal cues;Handout    Comprehension Verbalized understanding;Returned demonstration            PT Short Term Goals - 03/27/21 0946      PT SHORT TERM GOAL #1   Title Patient to report understanding and compliance with  post-epley instructions.    Time 2    Period Weeks    Status Achieved    Target Date 04/04/21             PT Long Term Goals - 03/27/21 0946      PT LONG TERM GOAL #1   Title Patient to be independent with vestibular HEP.    Time 6    Period Weeks    Status On-going      PT LONG TERM GOAL #2   Title Patient to report no dizziness with positional testing.    Time 6    Period Weeks    Status On-going      PT LONG TERM GOAL #3   Title Patient to ascend/descend 13 stairs with 1 handrail as needed with good stability.    Time 6    Period Weeks    Status On-going      PT LONG TERM GOAL #4   Title Patient to return to golf without dizziness limiting.    Time 6    Period Weeks    Status On-going                 Plan - 04/03/21 1452    Clinical Impression Statement Patient reporting improvement in dizziness by 80% at this time. Still noticing it when bending forward while playing golf but denies dizziness with bed mobility. Upon discussion, patient also reports imbalance with stairs, thus this was assessed. Patient demonstrating heavy handrail use especially when ascending and slightly decreased eccentric control when descending B. Initiated sitting R/L anterior canal habituation with patient reporting mild dizziness upon sitting up from L side. Continued with standing habituation exercises requiring patient to bend forward with the same complaint. R DH was positive for R posterior canal BPPV again today. Patient was treated with R Epley x2, with 2nd trial including vibration over the R mastoid. Patient reported no dizziness upon sitting up from 2nd trial. Reviewed post Epley precautions and updated HEP with anterior habituation. Patient reported understanding and without complaints at end of session.    Comorbidities CABG x2, HTN, DM, depression, COPD, CAD, toe amputation    PT Treatment/Interventions ADLs/Self Care Home Management;Canalith Repostioning;Cryotherapy;Moist  Heat;Balance training;Therapeutic exercise;Therapeutic activities;Functional mobility training;Stair training;Gait training;Neuromuscular re-education;Patient/family education;Vestibular;Energy conservation;Passive range of motion    PT Next Visit Plan reassess R DH    Consulted and Agree with Plan of Care Patient  Patient will benefit from skilled therapeutic intervention in order to improve the following deficits and impairments:  Decreased activity tolerance,Decreased balance,Difficulty walking,Improper body mechanics,Dizziness,Decreased range of motion,Impaired flexibility,Postural dysfunction  Visit Diagnosis: BPPV (benign paroxysmal positional vertigo), right  Dizziness and giddiness  Unsteadiness on feet     Problem List Patient Active Problem List   Diagnosis Date Noted  . BPPV (benign paroxysmal positional vertigo) 03/17/2021  . Microcytic anemia 02/22/2020  . Generalized anxiety disorder 09/24/2018  . Major depressive disorder, single episode, unspecified 09/24/2018  . Abrasion of great toe, left 07/03/2018  . Chronic systolic heart failure (HCC) 06/13/2018  . Hyperlipidemia LDL goal <70 05/07/2018  . Essential (primary) hypertension 05/07/2018  . Ischemic cardiomyopathy 05/01/2018  . Pleural effusion 04/29/2018  . Demand ischemia (HCC) 04/29/2018  . Prolonged QT interval 04/29/2018  . Diabetes mellitus type 2 in nonobese (HCC) 04/29/2018  . Anxiety and depression 04/29/2018  . CAD (coronary artery disease) 04/29/2018  . Acute systolic CHF (congestive heart failure) (HCC) 04/28/2018  . Normocytic anemia 04/21/2018  . Multiple pulmonary nodules 12/25/2017  . Callus of foot 10/23/2017  . Metatarsalgia of both feet 10/23/2017  . Hx of CABG 04/28/2017  . Plantar fat pad atrophy of left foot 12/11/2016  . Hallux rigidus of left foot 09/11/2016  . Diabetic toe ulcer (HCC) 05/16/2016  . Contracture of left Achilles tendon 01/18/2016  . Hyperlipidemia LDL  goal <100 05/12/2015  . Iron deficiency anemia 07/07/2014  . Major depressive disorder, recurrent episode (HCC) 07/07/2014  . Microalbuminuria due to type 2 diabetes mellitus (HCC) 07/07/2014  . Osteoarthritis 07/07/2014  . Peripheral vascular disease (HCC) 07/07/2014  . Polyneuropathy in diabetes (HCC) 07/07/2014  . Preproliferative diabetic retinopathy (HCC) 07/07/2014  . Testicular hypofunction 07/07/2014  . Status post amputation of foot (HCC) 07/07/2014  . Type 2 diabetes mellitus without complication (HCC) 07/07/2014  . Esophageal reflux 07/06/2014  . Fatty (change of) liver, not elsewhere classified 07/06/2014  . History of colon polyps 07/06/2014  . History of gastric bypass 07/06/2014  . Insomnia 07/06/2014  . Other acquired hammer toe 07/06/2014  . Coronary atherosclerosis 07/06/2014  . Essential hypertension 07/06/2014     Anette Guarneri, PT, DPT 04/03/21 2:59 PM   [facility] [address]  [address] [place], [place], [address] Phone: [phone]   Fax:  [phone]  Name: Alec Taylor MRN: [id number] Date of Birth: 1946/10/08

## 2021-04-03 NOTE — Progress Notes (Signed)
Alec Taylor [id number] 03/21/1946 75 y.o.  Subjective:   Patient ID:  Alec Taylor is a 75 y.o. (DOB [birth year]) male.  Chief Complaint:  Chief Complaint  Patient presents with  . Depression    HPI Rodin Pullium presents to the office today for follow-up of depression, anxiety, and insomnia. He reports that he stopped Wellbutrin XL due to dizziness. He reports that he "already has some dizziness" and Wellbutrin XL exacerbated this. He reports that he has vertigo and has apt today.   He reports feeling "a little lost" now that he is no longer working and they are no longer planning daughter's wedding. Friend invited him to golf trip in [place] and then they are traveling to [place]. He reports that she is looking forward to this. He reports low energy and motivation. He reports that he has diminished interest in things. He reports anxiety when he feels like he is being rushed or hurried and that this does not happen often. Sleeping ok with ambien. Appetite has been ok. Denies cravings and is not very interested in food. Concentration has been ok. He reports having passive death wishes. Denies SI.   Daughter's wedding went well. Was able to see old friends and his brothers. Most of his nephews and nieces came to the wedding. Reports that his wife is very busy with golf teams and rating golf courses. Wife also does rowing.    Past Psychiatric Medication Trials: Cymbalta Aricept Wellbutrin XL- Dizziness BuSpar- Dizziness Ambien Ambien CR Xanax Klonopin  Review of Systems:  Review of Systems  Musculoskeletal: Negative for gait problem.  Neurological: Negative for tremors.  Psychiatric/Behavioral:       Please refer to HPI    Medications: I have reviewed the patient's current medications.  Current Outpatient Medications  Medication Sig Dispense Refill  . DULoxetine (CYMBALTA) 30 MG capsule Take 1 capsule (30 mg total) by mouth daily. Take with a 60 mg capsule to equal total dose of 90  mg 90 capsule 0  . ALPRAZolam (XANAX) 0.25 MG tablet TAKE ONE-HALF TO ONE TABLET BY MOUTH ONE TIME DAILY AS NEEDED FOR ANXIETY 30 tablet 2  . aspirin EC 81 MG tablet Take 81 mg by mouth at bedtime.     [name] atorvastatin (LIPITOR) 20 MG tablet Take 1 tablet (20 mg total) by mouth daily. Please make an appointment for further refills 30 tablet 0  . carvedilol (COREG) 6.25 MG tablet Take 1 tablet (6.25 mg total) by mouth 2 (two) times daily with a meal. 60 tablet 6  . Cholecalciferol (VITAMIN D3) 5000 units CAPS Take 5,000 Units by mouth daily.     [name] COVID-19 ANTIBODY TEST VI COVID-19 test specimen collection  TEST AS DIRECTED TODAY    . DULoxetine (CYMBALTA) 60 MG capsule Take 1 capsule (60 mg total) by mouth daily. 90 capsule 0  . empagliflozin (JARDIANCE) 10 MG TABS tablet Take 1 tablet (10 mg total) by mouth daily. Please make an appointment for refills 90 tablet 0  . finasteride (PROSCAR) 5 MG tablet Take 5 mg by mouth daily.   6  . folic acid (FOLVITE) 1 MG tablet Take 1 mg by mouth daily.  2  . furosemide (LASIX) 20 MG tablet Take 1 tablet (20 mg total) by mouth every Monday, Wednesday, and Friday. 39 tablet 3  . glimepiride (AMARYL) 2 MG tablet Take 2 mg by mouth every evening.  1  . metFORMIN (GLUCOPHAGE-XR) 500 MG 24 hr tablet Take 500 mg by mouth 2 (  two) times daily.  1  . methotrexate (RHEUMATREX) 2.5 MG tablet Take 12.5 mg by mouth once a week. On Wednesday (Patient not taking: Reported on 03/21/2021)  3  . Multiple Vitamins-Minerals (MULTIVITAMIN ADULT PO) Take 1 tablet by mouth daily. Centrum silver    . mycophenolate (CELLCEPT) 500 MG tablet Take 1,000 mg by mouth 2 (two) times daily.    [name] ofloxacin (OCUFLOX) 0.3 % ophthalmic solution Place 1 drop into both eyes as needed (Before procedure eye injections). Prior to procedures.    . polyvinyl alcohol (LIQUIFILM TEARS) 1.4 % ophthalmic solution 1 drop as needed.    . sacubitril-valsartan (ENTRESTO) 49-51 MG Take 1 tablet by mouth 2 (two)  times daily. 180 tablet 3  . zolpidem (AMBIEN CR) 12.5 MG CR tablet TAKE ONE TABLET BY MOUTH ONE TIME DAILY AT BEDTIME AS NEEDED FOR SLEEP 30 tablet 2   No current facility-administered medications for this visit.    Medication Side Effects: None  Allergies:  Allergies  Allergen Reactions  . Celecoxib Other (See Comments)    hyperkalemia  . Losartan Other (See Comments)    hyperkalemia  . Ramipril Cough         Past Medical History:  Diagnosis Date  . CAD (coronary artery disease) 09/2007   s/p CABG approximately 2004-[facility] (Cath - 17 Sep 2007:90% prox LAD with diffuse 85% narrowing to distal segment, 80% prox CXA, RCA: prox 90%, prox-mid 70%, mid 85%, distal 80%, D1 80%; Normal EF 65% --status post CABG--LIMA to LAD, SVG to PDA.    [name] COPD (chronic obstructive pulmonary disease) (HCC)   . Depression   . Diabetes mellitus without complication (HCC)   . Hypertension   . Ischemic cardiomyopathy   . Pleural effusion   . S/P CABG x 2    CABG--LIMA to LAD, SVG to PDA.      Past Medical History, Surgical history, Social history, and Family history were reviewed and updated as appropriate.   Please see review of systems for further details on the patient's review from today.   Objective:   Physical Exam:  Wt 195 lb (88.5 kg)   BMI 27.98 kg/m   Physical Exam Constitutional:      General: He is not in acute distress. Musculoskeletal:        General: No deformity.  Neurological:     Mental Status: He is alert and oriented to person, place, and time.     Coordination: Coordination normal.  Psychiatric:        Attention and Perception: Attention and perception normal. He does not perceive auditory or visual hallucinations.        Mood and Affect: Mood normal. Mood is not anxious or depressed. Affect is not labile, blunt, angry or inappropriate.        Speech: Speech normal.        Behavior: Behavior normal.        Thought Content: Thought content normal. Thought content is  not paranoid or delusional. Thought content does not include homicidal or suicidal ideation. Thought content does not include homicidal or suicidal plan.        Cognition and Memory: Cognition and memory normal.        Judgment: Judgment normal.     Comments: Insight intact     Lab Review:     Component Value Date/Time   NA 142 11/18/2020 1129   NA 140 07/08/2018 0926   K 5.9 (H) 11/18/2020 1129   CL 107 11/18/2020  1129   CO2 30 11/18/2020 1129   GLUCOSE 83 11/18/2020 1129   BUN 49 (H) 11/18/2020 1129   BUN 27 07/08/2018 0926   CREATININE 1.19 11/18/2020 1129   CALCIUM 9.7 11/18/2020 1129   PROT 6.9 11/18/2020 1129   ALBUMIN 4.5 11/18/2020 1129   AST 33 11/18/2020 1129   ALT 59 (H) 11/18/2020 1129   ALKPHOS 58 11/18/2020 1129   BILITOT 0.4 11/18/2020 1129   GFRNONAA >60 11/18/2020 1129   GFRAA >60 08/16/2020 1019       Component Value Date/Time   WBC 6.1 02/16/2021 1108   WBC 6.8 02/12/2020 1601   RBC 4.48 02/16/2021 1108   RBC 4.45 02/16/2021 1108   HGB 12.6 (L) 02/16/2021 1108   HCT 40.6 02/16/2021 1108   PLT 198 02/16/2021 1108   MCV 91.2 02/16/2021 1108   MCH 28.3 02/16/2021 1108   MCHC 31.0 02/16/2021 1108   RDW 13.2 02/16/2021 1108   LYMPHSABS 1.0 02/16/2021 1108   MONOABS 0.6 02/16/2021 1108   EOSABS 0.2 02/16/2021 1108   BASOSABS 0.0 02/16/2021 1108    No results found for: POCLITH, LITHIUM   No results found for: PHENYTOIN, PHENOBARB, VALPROATE, CBMZ   .res Assessment: Plan:    Pt seen for 30 minutes and time spent counseling pt regarding potential benefits, risks, and side effects of increasing Cymbalta to 90 mg daily. Discussed that this dose if off label. Pt agrees to increase in Cymablta and reports that he would prefer increased dose over starting a new medication.  Will increase Cymbalta to 90 mg daily for depression.  Continue Xanax prn anxiety.  Continue Ambien CR 12.5 mg po QHS prn insomnia.  Pt to follow-up in 1-2 months or sooner if  clinically indicated.  Patient advised to contact office with any questions, adverse effects, or acute worsening in signs and symptoms.   Davan was seen today for depression.  Diagnoses and all orders for this visit:  Recurrent major depressive disorder, in partial remission (HCC) -     DULoxetine (CYMBALTA) 30 MG capsule; Take 1 capsule (30 mg total) by mouth daily. Take with a 60 mg capsule to equal total dose of 90 mg     Please see After Visit Summary for patient specific instructions.  Future Appointments  Date Time Provider Department Center  04/03/2021  2:00 PM Maryruth Bun, [place] [facility]  05/16/2021 10:00 AM Corie Chiquito, [facility] None  06/19/2021 11:15 AM [facility] None  06/19/2021 11:45 AM [place], Brand Males, NP [facility] None    No orders of the defined types were placed in this encounter.   -------------------------------

## 2021-04-05 ENCOUNTER — Other Ambulatory Visit (HOSPITAL_COMMUNITY): Payer: Self-pay

## 2021-04-05 MED ORDER — ATORVASTATIN CALCIUM 20 MG PO TABS: 20.0000 mg | ORAL_TABLET | Freq: Every day | ORAL | 0 refills | Status: DC

## 2021-04-10 ENCOUNTER — Other Ambulatory Visit (HOSPITAL_COMMUNITY): Payer: Self-pay

## 2021-04-10 MED ORDER — CARVEDILOL 6.25 MG PO TABS: 6.2500 mg | ORAL_TABLET | Freq: Two times a day (BID) | ORAL | 4 refills | Status: DC

## 2021-04-19 ENCOUNTER — Encounter: Payer: Self-pay | Admitting: Physical Therapy

## 2021-04-19 ENCOUNTER — Ambulatory Visit: Payer: Medicare Other | Attending: Family | Admitting: Physical Therapy

## 2021-04-19 ENCOUNTER — Other Ambulatory Visit: Payer: Self-pay

## 2021-04-19 VITALS — BP 108/55 | HR 76

## 2021-04-19 DIAGNOSIS — R42 Dizziness and giddiness: Secondary | ICD-10-CM | POA: Insufficient documentation

## 2021-04-19 DIAGNOSIS — H8111 Benign paroxysmal vertigo, right ear: Secondary | ICD-10-CM | POA: Diagnosis present

## 2021-04-19 DIAGNOSIS — R2681 Unsteadiness on feet: Secondary | ICD-10-CM | POA: Diagnosis present

## 2021-04-19 NOTE — Therapy (Addendum)
Alec Taylor [address]  [place], [place], [address] Phone: [phone]   Fax:  [phone]  Physical Therapy Treatment  Patient Details  Name: Alec Taylor MRN: [id number] Date of Birth: 1946-03-11 Referring Provider (PT): Alec Alar, NP   Progress Note Reporting Period 03/21/21 to 04/19/21  See note below for Objective Data and Assessment of Progress/Goals.     Encounter Date: 04/19/2021   PT End of Session - 04/19/21 1051     Visit Number 4    Number of Visits 13    Date for PT Re-Evaluation 05/02/21    Authorization Type Medicare & Aetna    PT Start Time 1009    PT Stop Time 1050    PT Time Calculation (min) 41 min    Equipment Utilized During Treatment Gait belt    Activity Tolerance Patient tolerated treatment well    Behavior During Therapy WFL for tasks assessed/performed             Past Medical History:  Diagnosis Date   CAD (coronary artery disease) 09/2007   s/p CABG approximately 2004-[facility] (Cath - 17 Sep 2007:90% prox LAD with diffuse 85% narrowing to distal segment, 80% prox CXA, RCA: prox 90%, prox-mid 70%, mid 85%, distal 80%, D1 80%; Normal EF 65% --status post CABG--LIMA to LAD, SVG to PDA.     COPD (chronic obstructive pulmonary disease) (HCC)    Depression    Diabetes mellitus without complication (HCC)    Hypertension    Ischemic cardiomyopathy    Pleural effusion    S/P CABG x 2    CABG--LIMA to LAD, SVG to PDA.      Past Surgical History:  Procedure Laterality Date   APPENDECTOMY     CHOLECYSTECTOMY     CORONARY ARTERY BYPASS GRAFT     GASTRIC BYPASS     LEFT HEART CATH AND CORS/GRAFTS ANGIOGRAPHY N/A 04/30/2018   Procedure: LEFT HEART CATH AND CORS/GRAFTS ANGIOGRAPHY;  Surgeon: Alec Man, MD;  Location: [facility];  Service: Cardiovascular;  Laterality: N/A;   RIGHT/LEFT HEART CATH AND CORONARY/GRAFT ANGIOGRAPHY N/A 02/18/2020   Procedure:  RIGHT/LEFT HEART CATH AND CORONARY/GRAFT ANGIOGRAPHY;  Surgeon: Alec Artist, MD;  Location: [facility];  Service: Cardiovascular;  Laterality: N/A;   TOE AMPUTATION      Vitals:   04/19/21 1014  BP: (!) 108/55  Pulse: 76  SpO2: 99%     Subjective Assessment - 04/19/21 1010     Subjective Has been feeling pretty good. Went on a trip to [place] where he played golf about 8 times. Got a little dizzy and out of breath- hills made him tired and this did not occur during each game. Was able to play 36 holes which he had not done in 20 years. Requesting BP check. Reports compliance with HEP and reports that it is no longer giving him dizziness.    Pertinent History CABG x2, HTN, DM, depression, COPD, CAD, toe amputation    Diagnostic tests none recent    Patient Stated Goals decrease dizziness    Currently in Pain? No/denies                                Vestibular Treatment/Exercise - 04/19/21 0001       Vestibular Treatment/Exercise   Habituation Exercises Alec Taylor Daroff   sitting anterior habituation to R with EO/EC  3x- no dizziness; cues to quicken pace; standing 3x with EC- c/o 3-4/10 dizziness and mild imbalnce     Alec Taylor   Number of Reps  3    Symptom Description  3x EO- mild dizziness which resolved with subsequent reps; 3x EC with feet on dynadisc- mild dizziness upon sitting   unable to keep feet on dynadisc d/t LBP               Balance Exercises - 04/19/21 0001       Balance Exercises: Standing   Other Standing Exercises STS on foam 10x   CGA; cues to lean chest forward for ease of transfer   Other Standing Exercises Comments R/L forward/back stepping + head nods to targets 10x each with CGA/min A for balance   no c/o dizziness; cues to widen BOS for imbalance              PT Education - 04/19/21 1051     Education Details update to Alec Taylor) Educated Patient    Methods Explanation;Demonstration;Tactile  cues;Verbal cues;Handout    Comprehension Verbalized understanding;Returned demonstration              PT Short Term Goals - 03/27/21 0946       PT SHORT TERM GOAL #1   Title Patient to report understanding and compliance with post-epley instructions.    Time 2    Period Weeks    Status Achieved    Target Date 04/04/21               PT Long Term Goals - 03/27/21 0946       PT LONG TERM GOAL #1   Title Patient to be independent with vestibular HEP.    Time 6    Period Weeks    Status On-going      PT LONG TERM GOAL #2   Title Patient to report no dizziness with positional testing.    Time 6    Period Weeks    Status On-going      PT LONG TERM GOAL #3   Title Patient to ascend/descend 13 stairs with 1 handrail as needed with good stability.    Time 6    Period Weeks    Status On-going      PT LONG TERM GOAL #4   Title Patient to return to golf without dizziness limiting.    Time 6    Period Weeks    Status On-going                   Plan - 04/19/21 1051     Clinical Impression Statement Patient arrived to session after a trip to [facility] where he went golfing several times. Does report some episodes of SOB from walking up hills and some dizziness. Notes no longer having dizziness with HEP. Patient requested BP check, which showed slightly low BP, however patient asymptomatic. Patient performed R Alec Taylor with c/o mild dizziness upon laying down which improved with subsequent reps.  Correction of head position required. Increased challenge with removal of visual fixation and proprioception, with patient still reporting only mild dizziness. Increased challenge with anterior habituation, with patient reporting mild dizziness and demonstrating some imbalance. Dynamic balance work required CGA and min A for safety; improved with cues to widen BOS. Updated HEP with today's habituation exercises as they were well-tolerated today and provided instruction for max  safety. Patient reported understanding and without complaints at end of  session.    Comorbidities CABG x2, HTN, DM, depression, COPD, CAD, toe amputation    PT Treatment/Interventions ADLs/Self Care Home Management;Canalith Repostioning;Cryotherapy;Moist Heat;Balance training;Therapeutic exercise;Therapeutic activities;Functional mobility training;Stair training;Gait training;Neuromuscular re-education;Patient/family education;Vestibular;Energy conservation;Passive range of motion    PT Next Visit Plan progress habituation and balance work    Consulted and Agree with Plan of Care Patient             Patient will benefit from skilled therapeutic intervention in order to improve the following deficits and impairments:  Decreased activity tolerance,Decreased balance,Difficulty walking,Improper body mechanics,Dizziness,Decreased range of motion,Impaired flexibility,Postural dysfunction  Visit Diagnosis: BPPV (benign paroxysmal positional vertigo), right  Dizziness and giddiness  Unsteadiness on feet     Problem List Patient Active Problem List   Diagnosis Date Noted   BPPV (benign paroxysmal positional vertigo) 03/17/2021   Microcytic anemia 02/22/2020   Generalized anxiety disorder 09/24/2018   Major depressive disorder, single episode, unspecified 09/24/2018   Abrasion of great toe, left 60/60/0459   Chronic systolic heart failure ([place]) 06/13/2018   Hyperlipidemia LDL goal <70 05/07/2018   Essential (primary) hypertension 05/07/2018   Ischemic cardiomyopathy 05/01/2018   Pleural effusion 04/29/2018   Demand ischemia ([place]) 04/29/2018   Prolonged QT interval 04/29/2018   Diabetes mellitus type 2 in nonobese ([place]) 04/29/2018   Anxiety and depression 04/29/2018   CAD (coronary artery disease) 97/74/1423   Acute systolic CHF (congestive heart failure) ([place]) 04/28/2018   Normocytic anemia 04/21/2018   Multiple pulmonary nodules 12/25/2017   Callus of foot 10/23/2017    Metatarsalgia of both feet 10/23/2017   Hx of CABG 04/28/2017   Plantar fat pad atrophy of left foot 12/11/2016   Hallux rigidus of left foot 09/11/2016   Diabetic toe ulcer ([place]) 05/16/2016   Contracture of left Achilles tendon 01/18/2016   Hyperlipidemia LDL goal <100 05/12/2015   Iron deficiency anemia 07/07/2014   Major depressive disorder, recurrent episode ([place]) 07/07/2014   Microalbuminuria due to type 2 diabetes mellitus ([place]) 07/07/2014   Osteoarthritis 07/07/2014   Peripheral vascular disease ([place]) 07/07/2014   Polyneuropathy in diabetes ([place]) 07/07/2014   Preproliferative diabetic retinopathy ([place]) 07/07/2014   Testicular hypofunction 07/07/2014   Status post amputation of foot ([place]) 07/07/2014   Type 2 diabetes mellitus without complication ([place]) 95/32/0233   Esophageal reflux 07/06/2014   Fatty (change of) liver, not elsewhere classified 07/06/2014   History of colon polyps 07/06/2014   History of gastric bypass 07/06/2014   Insomnia 07/06/2014   Other acquired hammer toe 07/06/2014   Coronary atherosclerosis 07/06/2014   Essential hypertension 07/06/2014    Janene Harvey, PT, DPT 04/19/21 10:57 AM   [facility] [address]  [place], [place], [address] Phone: [phone]   Fax:  [phone]  Name: Alec Taylor MRN: [id number] Date of Birth: 05/05/1946  PHYSICAL THERAPY DISCHARGE SUMMARY  Visits from Start of Care: 4  Current functional level related to goals / functional outcomes: Unable to assess; patient did not return since last session   Remaining deficits: Dizziness with golf   Education / Equipment: HEP  Plan: Patient agrees to discharge.  Patient goals were not met. Patient is being discharged due to not returning since last session.    Janene Harvey, PT, DPT 05/23/21 11:21 AM

## 2021-04-28 ENCOUNTER — Ambulatory Visit: Payer: Medicare Other | Admitting: Physical Therapy

## 2021-05-03 ENCOUNTER — Ambulatory Visit: Payer: Medicare Other | Admitting: Physical Therapy

## 2021-05-08 NOTE — Progress Notes (Signed)
Advanced [facility] Failure Clinic Note   Referring Physician: Dr Excell Seltzer Primary Care: Dr Derrell Lolling Primary [facility] Cardiologist: Dr Beverely Pace   HPI: Alec Taylor is a 75 y.o. male with history of CAD s/p CABG 2004 at [facility], HTN, hyperlipidemia, DMII, obesity s/p gastric bypass and systolic HF.   Echocardiogram 12/2017  revealed LVEF of 45 to 50%.   Admitted 04/28/18 with increased dyspnea. CTA negative for PE. ECHO performed and showed reduced EF 35-40%. He underwent LHC . Stable coronary disease and grafts. Diuresed well. Discharge weight was 177 pounds.   Last seen 05/2020 by Dr [name]. Carvedilol was increased to 9.375 mg twice a day.   Today he returns for HF follow up. On 05/04/21 had left arm pain that he thinks was associated with stress. Went away after taking Ambien. Says his wife was just diagnosed with bladder cancer.  SOB with steps but this is his baseline.  Denies PND/Orthopnea. Appetite ok. No fever or chills. Weight at home has been stable. Taking all medications.   Cath 3/21 1. Severe native 3v CAD with patent grafts to LAD and dRCA 2. EF 45-50% Ao = 115/51 (76) LV = 107/17 RA = 6 RV = 45/6 PA =  51/20 (33) PCW = 19 (v wave 28) Fick cardiac output/index = 8.0/4.0 PVR = 1.4 WU FA sat = 99% PA sat = 69%, 74% SVC sat = 72%  Echo 3/21 . LV dilated EF 40-45% RV ok. Personally reviewed Echo 1/20 EF 40-45%   CMRI 8/14  1. Normal left ventricular size with mild concentric hypertrophy and severely decreased systolic function (LVEF = 38%) with diffuse hypokinesis. There is pericardial late gadolinium enhancement in the basal and mid inferior walls. 2. Normal right ventricular size, thickness and borderline systolic function (LVEF = 45%). There are no regional wall motion abnormalities. 3.  Moderately dilated left atrium, mildly dilated right atrium. 4. Normal size of the aortic root, ascending aorta. Mildly dilated pulmonary artery measuring 31 mm. 5.  Mild aortic,  mitral and trivial tricuspid regurgitation. 6.  Normal pericardium.  No pericardial effusion  ECHO 04/29/2018  Left ventricle: The cavity size was normal. Wall thickness was   normal. Systolic function was moderately reduced. The estimated   ejection fraction was in the range of 35% to 40%. Diffuse   hypokinesis. The study is not technically sufficient to allow   evaluation of LV diastolic function. - Aortic valve: There was mild regurgitation. - Mitral valve: Severely calcified annulus. There was mild   regurgitation. - Left atrium: The atrium was mildly dilated. - Tricuspid valve: There was trivial regurgitation  LHC 04/30/2018   Prox RCA to Mid RCA lesion is 65% stenosed.  Dist RCA-1 lesion is 85% stenosed. Dist RCA-2 lesion is 95% stenosed with 80% stenosed side branch in Post Atrio.  SVG-rPDA graft was visualized by angiography and is normal in caliber and large. The graft exhibits no disease.  Ost RPDA lesion is 45% stenosed. Ost RPDA to prox RPDA lesion is 90% stenosed - This limits flow retrograde to the RPAV-RPL.  Ost LAD lesion is 55% stenosed.  Ost 1st Diag (small caliber vessel) lesion is 70% stenosed.  Prox LAD lesion is 100% stenosed.  LIMA-LAD graft is large caliber, widely patent perfusing a small caliber distal LAD  Prox Cx to Mid Cx stent is 25% stenosed.  Dist Cx lesion is 65% stenosed. Ost 3rd Mrg to 3rd Mrg lesion is 70% stenosed. -Both limbs of the distal bifurcation.  There is  severe left ventricular systolic dysfunction. The left ventricular ejection fraction is less than 25% by visual estimate.  LV end diastolic pressure is moderately elevated.  Significant aortic and mitral valve calcification.  Calcified pericardium  Review of systems complete and found to be negative unless listed in HPI.   Past Medical History:  Diagnosis Date  . CAD (coronary artery disease) 09/2007   s/p CABG approximately 2004-[facility] (Cath - 17 Sep 2007:90% prox LAD with  diffuse 85% narrowing to distal segment, 80% prox CXA, RCA: prox 90%, prox-mid 70%, mid 85%, distal 80%, D1 80%; Normal EF 65% --status post CABG--LIMA to LAD, SVG to PDA.    [name] COPD (chronic obstructive pulmonary disease) (HCC)   . Depression   . Diabetes mellitus without complication (HCC)   . Hypertension   . Ischemic cardiomyopathy   . Pleural effusion   . S/P CABG x 2    CABG--LIMA to LAD, SVG to PDA.     Current Outpatient Medications  Medication Sig Dispense Refill  . ALPRAZolam (XANAX) 0.25 MG tablet TAKE ONE-HALF TO ONE TABLET BY MOUTH ONE TIME DAILY AS NEEDED FOR ANXIETY 30 tablet 2  . aspirin EC 81 MG tablet Take 81 mg by mouth at bedtime.     [name] atorvastatin (LIPITOR) 20 MG tablet Take 1 tablet (20 mg total) by mouth daily. Please make an appointment for further refills 30 tablet 0  . carvedilol (COREG) 6.25 MG tablet Take 1 tablet (6.25 mg total) by mouth 2 (two) times daily with a meal. 60 tablet 4  . Cholecalciferol (VITAMIN D3) 5000 units CAPS Take 5,000 Units by mouth daily.     . DULoxetine (CYMBALTA) 60 MG capsule Take 1 capsule (60 mg total) by mouth daily. 90 capsule 0  . empagliflozin (JARDIANCE) 10 MG TABS tablet Take 1 tablet (10 mg total) by mouth daily. Please make an appointment for refills 90 tablet 0  . finasteride (PROSCAR) 5 MG tablet Take 5 mg by mouth daily.   6  . folic acid (FOLVITE) 1 MG tablet Take 1 mg by mouth daily.  2  . furosemide (LASIX) 20 MG tablet Take 1 tablet (20 mg total) by mouth every Monday, Wednesday, and Friday. 39 tablet 3  . glimepiride (AMARYL) 2 MG tablet Take 2 mg by mouth every evening.  1  . metFORMIN (GLUCOPHAGE-XR) 500 MG 24 hr tablet Take 500 mg by mouth 2 (two) times daily.  1  . Multiple Vitamins-Minerals (MULTIVITAMIN ADULT PO) Take 1 tablet by mouth daily. Centrum silver    . ofloxacin (OCUFLOX) 0.3 % ophthalmic solution Place 1 drop into both eyes as needed (Before procedure eye injections). Prior to procedures.    .  polyvinyl alcohol (LIQUIFILM TEARS) 1.4 % ophthalmic solution 1 drop as needed.    . sacubitril-valsartan (ENTRESTO) 49-51 MG Take 1 tablet by mouth 2 (two) times daily. 180 tablet 3  . zolpidem (AMBIEN CR) 12.5 MG CR tablet TAKE ONE TABLET BY MOUTH ONE TIME DAILY AT BEDTIME AS NEEDED FOR SLEEP 30 tablet 2  . COVID-19 ANTIBODY TEST VI COVID-19 test specimen collection  TEST AS DIRECTED TODAY (Patient not taking: Reported on 05/09/2021)    . methotrexate (RHEUMATREX) 2.5 MG tablet Take 12.5 mg by mouth once a week. On Wednesday (Patient not taking: No sig reported)  3  . mycophenolate (CELLCEPT) 500 MG tablet Take 1,000 mg by mouth 2 (two) times daily. (Patient not taking: Reported on 05/09/2021)     No current facility-administered medications  for this encounter.   Allergies  Allergen Reactions  . Celecoxib Other (See Comments)    hyperkalemia  . Losartan Other (See Comments)    hyperkalemia  . Ramipril Cough        Social History   Socioeconomic History  . Marital status: Legally Separated    Spouse name: Not on file  . Number of children: Not on file  . Years of education: Not on file  . Highest education level: Not on file  Occupational History  . Not on file  Tobacco Use  . Smoking status: Never Smoker  . Smokeless tobacco: Never Used  Vaping Use  . Vaping Use: Never used  Substance and Sexual Activity  . Alcohol use: Yes    Alcohol/week: 2.0 standard drinks    Types: 2 Cans of beer per week    Comment: occ  . Drug use: Never  . Sexual activity: Not on file  Other Topics Concern  . Not on file  Social History Narrative  . Not on file   Social Determinants of Health   Financial Resource Strain: Not on file  Food Insecurity: Not on file  Transportation Needs: Not on file  Physical Activity: Not on file  Stress: Not on file  Social Connections: Not on file  Intimate Partner Violence: Not on file    Family History  Problem Relation Age of Onset  . Hypertension  Father    Vitals:   05/09/21 1011  BP: 140/78  Pulse: 88  SpO2: 97%  Weight: 85.1 kg (187 lb 9.6 oz)     Wt Readings from Last 3 Encounters:  05/09/21 85.1 kg (187 lb 9.6 oz)  03/17/21 88.9 kg (196 lb)  02/16/21 88.5 kg (195 lb 0.6 oz)     PHYSICAL EXAM: General:  Well appearing. No resp difficulty. Walked in the clinic.  HEENT: normal Neck: supple. no JVD. Carotids 2+ bilat; no bruits. No lymphadenopathy or thryomegaly appreciated. Cor: PMI nondisplaced. Regular rate & rhythm. No rubs, gallops or murmurs. Lungs: clear Abdomen: soft, nontender, nondistended. No hepatosplenomegaly. No bruits or masses. Good bowel sounds. Extremities: no cyanosis, clubbing, rash, edema Neuro: alert & orientedx3, cranial nerves grossly intact. moves all 4 extremities w/o difficulty. Affect pleasant   ASSESSMENT & PLAN:  1. Chronic Systolic Heart Failure due to ICM - EF 45-50% in January 2019  - ECHO 04/2018 EF 35-40%. ?Viral versus PVC - CMRI 07/2018 EF 38% pericardial late gadolinium enhancement in the basal and mid inferior walls. Suspicious for recent myocarditis.  - Echo 1/20 EF 40-45% - Echo 3/21 EF 40-45% - Cath 3/21: Underwent R/L cath with stable anatomy and no targets for PCI. -NYHA III. Volume status stable.  - Continue carvedilol to 9.375 mg BID - Continue Entresto 49/51 mg BID.  - Continue Jardiance 10  - Continue spironolactone 12.5 mg daily.  - Continue lasix 20mg  MWF  - Check BMET  - Set up ECHO    2. CAD S/P CABG  - LHC 04/30/2018 severe multivessel disease and patent LIMA-LAD SVG-PDA - Occasional angina (stable) - Cath 3/21 stable anatomy. No PCI targets - No chest pain. Given script for SL Nitro.  - Continue statin, BB, and ASA. Increase carvedilol 9.375 bid  3. Anemia - secondary to gastric bypass surgery - Iron stores low 05/28/18.  -He continues to receive feraheme - he has recurrent iron deficiency. Followed by PCP and GI  4. DMII  - Follows with Dr. 05/30/18 (Endo) - Continue Jardiance  5. PVCs - No palpitations currently.    Follow up in 2-3 months with an ECHO and Dr [name].   Tonye Becket, NP  10:27 AM

## 2021-05-09 ENCOUNTER — Other Ambulatory Visit: Payer: Self-pay

## 2021-05-09 ENCOUNTER — Encounter (HOSPITAL_COMMUNITY): Payer: Self-pay

## 2021-05-09 ENCOUNTER — Ambulatory Visit (HOSPITAL_COMMUNITY)
Admission: RE | Admit: 2021-05-09 | Discharge: 2021-05-09 | Disposition: A | Discharge status: Home / Self Care | Payer: Medicare Other | Source: Ambulatory Visit | Attending: Cardiology | Admitting: Cardiology

## 2021-05-09 VITALS — BP 140/78 | HR 88 | Wt 187.6 lb

## 2021-05-09 DIAGNOSIS — Z79899 Other long term (current) drug therapy: Secondary | ICD-10-CM | POA: Diagnosis not present

## 2021-05-09 DIAGNOSIS — E785 Hyperlipidemia, unspecified: Secondary | ICD-10-CM | POA: Insufficient documentation

## 2021-05-09 DIAGNOSIS — I2510 Atherosclerotic heart disease of native coronary artery without angina pectoris: Secondary | ICD-10-CM | POA: Diagnosis not present

## 2021-05-09 DIAGNOSIS — I493 Ventricular premature depolarization: Secondary | ICD-10-CM | POA: Diagnosis not present

## 2021-05-09 DIAGNOSIS — E669 Obesity, unspecified: Secondary | ICD-10-CM | POA: Insufficient documentation

## 2021-05-09 DIAGNOSIS — Z8249 Family history of ischemic heart disease and other diseases of the circulatory system: Secondary | ICD-10-CM | POA: Insufficient documentation

## 2021-05-09 DIAGNOSIS — Z951 Presence of aortocoronary bypass graft: Secondary | ICD-10-CM | POA: Insufficient documentation

## 2021-05-09 DIAGNOSIS — D508 Other iron deficiency anemias: Secondary | ICD-10-CM | POA: Insufficient documentation

## 2021-05-09 DIAGNOSIS — I110 Hypertensive heart disease with heart failure: Secondary | ICD-10-CM | POA: Insufficient documentation

## 2021-05-09 DIAGNOSIS — Z7982 Long term (current) use of aspirin: Secondary | ICD-10-CM | POA: Insufficient documentation

## 2021-05-09 DIAGNOSIS — Z6826 Body mass index (BMI) 26.0-26.9, adult: Secondary | ICD-10-CM | POA: Diagnosis not present

## 2021-05-09 DIAGNOSIS — I5022 Chronic systolic (congestive) heart failure: Secondary | ICD-10-CM | POA: Insufficient documentation

## 2021-05-09 DIAGNOSIS — I255 Ischemic cardiomyopathy: Secondary | ICD-10-CM | POA: Diagnosis not present

## 2021-05-09 DIAGNOSIS — Z7984 Long term (current) use of oral hypoglycemic drugs: Secondary | ICD-10-CM | POA: Diagnosis not present

## 2021-05-09 DIAGNOSIS — I25118 Atherosclerotic heart disease of native coronary artery with other forms of angina pectoris: Secondary | ICD-10-CM | POA: Diagnosis not present

## 2021-05-09 DIAGNOSIS — K9589 Other complications of other bariatric procedure: Secondary | ICD-10-CM | POA: Insufficient documentation

## 2021-05-09 DIAGNOSIS — E119 Type 2 diabetes mellitus without complications: Secondary | ICD-10-CM | POA: Diagnosis not present

## 2021-05-09 LAB — BASIC METABOLIC PANEL
Anion gap: 6 (ref 5–15)
BUN: 38 mg/dL — ABNORMAL HIGH (ref 8–23)
CO2: 26 mmol/L (ref 22–32)
Calcium: 9 mg/dL (ref 8.9–10.3)
Chloride: 104 mmol/L (ref 98–111)
Creatinine, Ser: 1.06 mg/dL (ref 0.61–1.24)
GFR, Estimated: >60 mL/min (ref >60)
Glucose, Bld: 168 mg/dL — ABNORMAL HIGH (ref 70–99)
Potassium: 5.6 mmol/L — ABNORMAL HIGH (ref 3.5–5.1)
Sodium: 136 mmol/L (ref 135–145)

## 2021-05-09 MED ORDER — NITROGLYCERIN 0.4 MG SL SUBL: 0.4000 mg | SUBLINGUAL_TABLET | SUBLINGUAL | 3 refills | Status: AC | PRN

## 2021-05-09 NOTE — Patient Instructions (Signed)
It was great to see you today! No medication changes are needed at this time.  Labs today We will only contact you if something comes back abnormal or we need to make some changes. Otherwise no news is good news!   Your physician recommends that you schedule a follow-up appointment in: 2 months with Dr [name]   Do the following things EVERYDAY: 1) Weigh yourself in the morning before breakfast. Write it down and keep it in a log. 2) Take your medicines as prescribed 3) Eat low salt foods--Limit salt (sodium) to 2000 mg per day.  4) Stay as active as you can everyday 5) Limit all fluids for the day to less than 2 liters  At the [facility], you and your health needs are our priority. As part of our continuing mission to provide you with exceptional heart care, we have created designated Provider Care Teams. These Care Teams include your primary Cardiologist (physician) and Advanced Practice Providers (APPs- Physician Assistants and Nurse Practitioners) who all work together to provide you with the care you need, when you need it.   You may see any of the following providers on your designated Care Team at your next follow up: [name] Dr Arvilla Meres . Dr Marca Ancona . Dr Thornell Mule . Tonye Becket, NP . Robbie Lis, PA . Shanda Bumps [place],NP . Karle Plumber, PharmD   Please be sure to bring in all your medications bottles to every appointment.   If you have any questions or concerns before your next appointment please send [place] a message through [place] or call our office at [phone].    TO LEAVE A MESSAGE FOR THE NURSE SELECT OPTION 2, PLEASE LEAVE A MESSAGE INCLUDING: . YOUR NAME . DATE OF BIRTH . CALL BACK NUMBER . REASON FOR CALL**this is important as we prioritize the call backs  YOU WILL RECEIVE A CALL BACK THE SAME DAY AS LONG AS YOU CALL BEFORE 4:00 PM

## 2021-05-10 ENCOUNTER — Telehealth: Payer: Self-pay | Admitting: Family Medicine

## 2021-05-10 NOTE — Telephone Encounter (Signed)
Can discuss with provider he will be seeing.

## 2021-05-10 NOTE — Telephone Encounter (Signed)
The patient states he went to the dentist and while they were taking an x-ray of his teeth, they told him something was wrong with his sinuses and that could be the reason why it was affecting his teeth. An appt was scheduled with Ramon Dredge. The patient is wondering if an ENT referral can be made.

## 2021-05-11 ENCOUNTER — Telehealth (HOSPITAL_COMMUNITY): Payer: Self-pay | Admitting: Cardiology

## 2021-05-11 DIAGNOSIS — I5022 Chronic systolic (congestive) heart failure: Secondary | ICD-10-CM

## 2021-05-11 NOTE — Telephone Encounter (Signed)
[phone] (M) pt aware and voiced understanding  He will have labs repeated at pcp /3

## 2021-05-11 NOTE — Telephone Encounter (Signed)
-----   Message from Sherald Hess, NP sent at 05/09/2021  1:16 PM EDT ----- Potassium elevated. Stop Multivitamin. Avoid high potassium foods. Repeat BMET Thursday.

## 2021-05-12 ENCOUNTER — Other Ambulatory Visit: Payer: Self-pay

## 2021-05-12 ENCOUNTER — Ambulatory Visit (INDEPENDENT_AMBULATORY_CARE_PROVIDER_SITE_OTHER): Payer: Medicare Other | Admitting: Medical

## 2021-05-12 VITALS — BP 134/53 | HR 85 | Resp 18 | Ht 70.0 in | Wt 191.0 lb

## 2021-05-12 DIAGNOSIS — K635 Polyp of colon: Secondary | ICD-10-CM

## 2021-05-12 DIAGNOSIS — E875 Hyperkalemia: Secondary | ICD-10-CM

## 2021-05-12 DIAGNOSIS — J019 Acute sinusitis, unspecified: Secondary | ICD-10-CM

## 2021-05-12 DIAGNOSIS — I2510 Atherosclerotic heart disease of native coronary artery without angina pectoris: Secondary | ICD-10-CM

## 2021-05-12 NOTE — Patient Instructions (Addendum)
For sinus infection placed ENT referral. Continue on augmentin. Give me update in 7 days in regards to when your ent appointment is scheduled.   For high potassium get cmp today.  Placed referral for screening colonoscopy.  Follow up in 7 days or as needed

## 2021-05-12 NOTE — Progress Notes (Signed)
Subjective:    Patient ID: Alec Taylor, male    DOB: [birth year], 75 y.o.   MRN: [id number]  HPI  Pt in for some recent sneezing, coughing while he was on a trip 2 weeks ago. He did have some nasal congestion. Also some left upper maxillary region pain.    Pt went to dentist and initially thought  left upper tooth pain related in implant. Implant removed and tightened up. He went to second dentist who stated was sinus infection after imaging studies.   Pt has been on augmentin for 3-4 days. Pt not sure how many days has been on. Pt was told to see ent by the dentist.  Pt also wants to be referred for colonoscopy.  Pt has mild high potassium recently. He stopped his multivitamin.     Review of Systems  Constitutional: Negative for chills, fatigue and fever.  Respiratory: Negative for cough, chest tightness, shortness of breath and wheezing.   Cardiovascular: Negative for chest pain and palpitations.  Gastrointestinal: Negative for abdominal pain, nausea and vomiting.  Musculoskeletal: Negative for back pain, gait problem and neck pain.  Skin: Negative for rash.  Neurological: Negative for dizziness, numbness and headaches.  Hematological: Negative for adenopathy. Does not bruise/bleed easily.  Psychiatric/Behavioral: Negative for behavioral problems, confusion, sleep disturbance and suicidal ideas. The patient is not nervous/anxious.    Past Medical History:  Diagnosis Date  . CAD (coronary artery disease) 09/2007   s/p CABG approximately 2004-[facility] (Cath - 17 Sep 2007:90% prox LAD with diffuse 85% narrowing to distal segment, 80% prox CXA, RCA: prox 90%, prox-mid 70%, mid 85%, distal 80%, D1 80%; Normal EF 65% --status post CABG--LIMA to LAD, SVG to PDA.    [name] COPD (chronic obstructive pulmonary disease) (HCC)   . Depression   . Diabetes mellitus without complication (HCC)   . Hypertension   . Ischemic cardiomyopathy   . Pleural effusion   . S/P CABG x 2    CABG--LIMA to LAD,  SVG to PDA.       Social History   Socioeconomic History  . Marital status: Legally Separated    Spouse name: Not on file  . Number of children: Not on file  . Years of education: Not on file  . Highest education level: Not on file  Occupational History  . Not on file  Tobacco Use  . Smoking status: Never Smoker  . Smokeless tobacco: Never Used  Vaping Use  . Vaping Use: Never used  Substance and Sexual Activity  . Alcohol use: Yes    Alcohol/week: 2.0 standard drinks    Types: 2 Cans of beer per week    Comment: occ  . Drug use: Never  . Sexual activity: Not on file  Other Topics Concern  . Not on file  Social History Narrative  . Not on file   Social Determinants of Health   Financial Resource Strain: Not on file  Food Insecurity: Not on file  Transportation Needs: Not on file  Physical Activity: Not on file  Stress: Not on file  Social Connections: Not on file  Intimate Partner Violence: Not on file    Past Surgical History:  Procedure Laterality Date  . APPENDECTOMY    . CHOLECYSTECTOMY    . CORONARY ARTERY BYPASS GRAFT    . GASTRIC BYPASS    . LEFT HEART CATH AND CORS/GRAFTS ANGIOGRAPHY N/A 04/30/2018   Procedure: LEFT HEART CATH AND CORS/GRAFTS ANGIOGRAPHY;  Surgeon: Marykay Lex, MD;  Location: [facility];  Service: Cardiovascular;  Laterality: N/A;  . RIGHT/LEFT HEART CATH AND CORONARY/GRAFT ANGIOGRAPHY N/A 02/18/2020   Procedure: RIGHT/LEFT HEART CATH AND CORONARY/GRAFT ANGIOGRAPHY;  Surgeon: Dolores Patty, MD;  Location: [facility];  Service: Cardiovascular;  Laterality: N/A;  . TOE AMPUTATION      Family History  Problem Relation Age of Onset  . Hypertension Father     Allergies  Allergen Reactions  . Celecoxib Other (See Comments)    hyperkalemia  . Losartan Other (See Comments)    hyperkalemia  . Ramipril Cough         Current Outpatient Medications on File Prior to Visit  Medication Sig Dispense Refill  .  ALPRAZolam (XANAX) 0.25 MG tablet TAKE ONE-HALF TO ONE TABLET BY MOUTH ONE TIME DAILY AS NEEDED FOR ANXIETY 30 tablet 2  . aspirin EC 81 MG tablet Take 81 mg by mouth at bedtime.     [name] atorvastatin (LIPITOR) 20 MG tablet Take 1 tablet (20 mg total) by mouth daily. Please make an appointment for further refills 30 tablet 0  . carvedilol (COREG) 6.25 MG tablet Take 1 tablet (6.25 mg total) by mouth 2 (two) times daily with a meal. 60 tablet 4  . Cholecalciferol (VITAMIN D3) 5000 units CAPS Take 5,000 Units by mouth daily.     [name] COVID-19 ANTIBODY TEST VI COVID-19 test specimen collection  TEST AS DIRECTED TODAY (Patient not taking: Reported on 05/09/2021)    . DULoxetine (CYMBALTA) 60 MG capsule Take 1 capsule (60 mg total) by mouth daily. 90 capsule 0  . empagliflozin (JARDIANCE) 10 MG TABS tablet Take 1 tablet (10 mg total) by mouth daily. Please make an appointment for refills 90 tablet 0  . finasteride (PROSCAR) 5 MG tablet Take 5 mg by mouth daily.   6  . folic acid (FOLVITE) 1 MG tablet Take 1 mg by mouth daily.  2  . furosemide (LASIX) 20 MG tablet Take 1 tablet (20 mg total) by mouth every Monday, Wednesday, and Friday. 39 tablet 3  . glimepiride (AMARYL) 2 MG tablet Take 2 mg by mouth every evening.  1  . metFORMIN (GLUCOPHAGE-XR) 500 MG 24 hr tablet Take 500 mg by mouth 2 (two) times daily.  1  . methotrexate (RHEUMATREX) 2.5 MG tablet Take 12.5 mg by mouth once a week. On Wednesday (Patient not taking: No sig reported)  3  . mycophenolate (CELLCEPT) 500 MG tablet Take 1,000 mg by mouth 2 (two) times daily. (Patient not taking: Reported on 05/09/2021)    . nitroGLYCERIN (NITROSTAT) 0.4 MG SL tablet Place 1 tablet (0.4 mg total) under the tongue every 5 (five) minutes as needed for chest pain. 100 tablet 3  . ofloxacin (OCUFLOX) 0.3 % ophthalmic solution Place 1 drop into both eyes as needed (Before procedure eye injections). Prior to procedures.    . polyvinyl alcohol (LIQUIFILM TEARS) 1.4 %  ophthalmic solution 1 drop as needed.    . sacubitril-valsartan (ENTRESTO) 49-51 MG Take 1 tablet by mouth 2 (two) times daily. 180 tablet 3  . zolpidem (AMBIEN CR) 12.5 MG CR tablet TAKE ONE TABLET BY MOUTH ONE TIME DAILY AT BEDTIME AS NEEDED FOR SLEEP 30 tablet 2   No current facility-administered medications on file prior to visit.    BP (!) 134/53   Pulse 85   Resp 18   Ht 5\' 10"  (1.778 m)   Wt 191 lb (86.6 kg)   SpO2 98%  BMI 27.41 kg/m       Objective:   Physical Exam  General- No acute distress. Pleasant patient. Neck- Full range of motion, no jvd Lungs- Clear, even and unlabored. Heart- regular rate and rhythm. Neurologic- CNII- XII grossly intact. heent- faint left maxillary sinus pressure to palpation.      Assessment & Plan:  For sinus infection placed ENT referral. Continue on augmentin. Give me update in 7 days in regards to when your ent appointment is scheduled.   For high potassium get cmp today.  Placed referral for screening colonoscopy.  Follow up in 7 days or as needed

## 2021-05-12 NOTE — Addendum Note (Signed)
Addended by: Rosita Kea on: 05/12/2021 04:07 PM   Modules accepted: Orders

## 2021-05-13 LAB — COMPREHENSIVE METABOLIC PANEL
AG Ratio: 1.7 (calc) (ref 1.0–2.5)
ALT: 67 U/L — ABNORMAL HIGH (ref 9–46)
AST: 35 U/L (ref 10–35)
Albumin: 4.3 g/dL (ref 3.6–5.1)
Alkaline phosphatase (APISO): 80 U/L (ref 35–144)
BUN/Creatinine Ratio: 46 (calc) — ABNORMAL HIGH (ref 6–22)
BUN: 45 mg/dL — ABNORMAL HIGH (ref 7–25)
CO2: 24 mmol/L (ref 20–32)
Calcium: 8.9 mg/dL (ref 8.6–10.3)
Chloride: 107 mmol/L (ref 98–110)
Creat: 0.97 mg/dL (ref 0.70–1.18)
Globulin: 2.5 g/dL (calc) (ref 1.9–3.7)
Glucose, Bld: 92 mg/dL (ref 65–99)
Potassium: 5.8 mmol/L — ABNORMAL HIGH (ref 3.5–5.3)
Sodium: 140 mmol/L (ref 135–146)
Total Bilirubin: 0.3 mg/dL (ref 0.2–1.2)
Total Protein: 6.8 g/dL (ref 6.1–8.1)

## 2021-05-13 MED ORDER — SODIUM POLYSTYRENE SULFONATE 15 GM/60ML PO SUSP: ORAL | 0 refills | Status: DC

## 2021-05-13 NOTE — Addendum Note (Signed)
Addended by: Gwenevere Abbot on: 05/13/2021 09:34 AM   Modules accepted: Orders

## 2021-05-15 ENCOUNTER — Encounter: Payer: Self-pay | Admitting: Family

## 2021-05-15 ENCOUNTER — Other Ambulatory Visit (HOSPITAL_COMMUNITY): Payer: Self-pay | Admitting: Unknown Physician Specialty

## 2021-05-15 MED ORDER — ATORVASTATIN CALCIUM 20 MG PO TABS: 20.0000 mg | ORAL_TABLET | Freq: Every day | ORAL | 6 refills | Status: DC

## 2021-05-16 ENCOUNTER — Ambulatory Visit (INDEPENDENT_AMBULATORY_CARE_PROVIDER_SITE_OTHER): Payer: Medicare Other | Admitting: Psychiatry

## 2021-05-16 ENCOUNTER — Encounter: Payer: Self-pay | Admitting: Psychiatry

## 2021-05-16 ENCOUNTER — Other Ambulatory Visit: Payer: Self-pay

## 2021-05-16 DIAGNOSIS — F5101 Primary insomnia: Secondary | ICD-10-CM | POA: Diagnosis not present

## 2021-05-16 DIAGNOSIS — F411 Generalized anxiety disorder: Secondary | ICD-10-CM | POA: Diagnosis not present

## 2021-05-16 DIAGNOSIS — F3341 Major depressive disorder, recurrent, in partial remission: Secondary | ICD-10-CM

## 2021-05-16 MED ORDER — DULOXETINE HCL 60 MG PO CPEP: 60.0000 mg | ORAL_CAPSULE | Freq: Every day | ORAL | 0 refills | Status: DC

## 2021-05-16 MED ORDER — ALPRAZOLAM 0.25 MG PO TABS: ORAL_TABLET | ORAL | 2 refills | Status: DC

## 2021-05-16 MED ORDER — ZOLPIDEM TARTRATE ER 12.5 MG PO TBCR
EXTENDED_RELEASE_TABLET | ORAL | 2 refills | Status: DC
Start: 2021-06-19 — End: 2021-09-20

## 2021-05-16 MED ORDER — BUPROPION HCL ER (XL) 150 MG PO TB24: 150.0000 mg | ORAL_TABLET | Freq: Every day | ORAL | 0 refills | Status: DC

## 2021-05-16 NOTE — Progress Notes (Signed)
Alec Taylor [id number] 06/06/1946 75 y.o.  Subjective:   Patient ID:  Alec Taylor is a 75 y.o. (DOB 23-Mar-1946) male.  Chief Complaint:  Chief Complaint  Patient presents with   Depression   Anxiety    HPI [name] presents to the office today for follow-up of depression and anxiety. Wife was diagnosed with bladder cancer a few weeks ago. He reports that it was an aggressive cancer in the early stages and had it removed last week. She will be having chemotherapy. He reports that he is "dealing with it." Daughter continues to have brain cancer.   He reports that he did not start increase in Cymbalta due to health issues and stressors. He reports that he re-started Wellbutrin a few days ago. He reports that he continues to have some depression. He reports energy is ok if he gets up and gets going in the morning and is trying to push himself to get up every morning. Motivation is somewhat low. He reports adequate sleep with Ambien. Appetite has been ok. He reports that he lost about 5 lbs. Eating a low salt diet and typically avoiding red meat. He reports difficulty with concentration. Denies SI.   "This gives me a purpose, taking care of Lendon Collar on trip to [place] and [place]. Played golf on Sunday. Trip [place]    Past Psychiatric Medication Trials: Cymbalta Aricept Wellbutrin XL- Dizziness BuSpar- Dizziness Ambien Ambien CR Xanax Klonopin    Review of Systems:  Review of Systems  HENT:  Positive for sinus pain.   Musculoskeletal:  Negative for gait problem.  Neurological:  Negative for tremors.  Psychiatric/Behavioral:         Please refer to HPI   Medications: I have reviewed the patient's current medications.  Current Outpatient Medications  Medication Sig Dispense Refill   amoxicillin-clavulanate (AUGMENTIN) 500-125 MG tablet Take by mouth.     aspirin EC 81 MG tablet Take 81 mg by mouth at bedtime.      atorvastatin (LIPITOR) 20  MG tablet Take 1 tablet (20 mg total) by mouth daily. Please make an appointment for further refills 30 tablet 6   carvedilol (COREG) 6.25 MG tablet Take 1 tablet (6.25 mg total) by mouth 2 (two) times daily with a meal. 60 tablet 4   Cholecalciferol (VITAMIN D3) 5000 units CAPS Take 5,000 Units by mouth daily.      COVID-19 ANTIBODY TEST VI      empagliflozin (JARDIANCE) 10 MG TABS tablet Take 1 tablet (10 mg total) by mouth daily. Please make an appointment for refills 90 tablet 0   finasteride (PROSCAR) 5 MG tablet Take 5 mg by mouth daily.   6   folic acid (FOLVITE) 1 MG tablet Take 1 mg by mouth daily.  2   furosemide (LASIX) 20 MG tablet Take 1 tablet (20 mg total) by mouth every Monday, Wednesday, and Friday. 39 tablet 3   glimepiride (AMARYL) 2 MG tablet Take 2 mg by mouth every evening.  1   metFORMIN (GLUCOPHAGE-XR) 500 MG 24 hr tablet Take 500 mg by mouth 2 (two) times daily.  1   polyvinyl alcohol (LIQUIFILM TEARS) 1.4 % ophthalmic solution 1 drop as needed.     sacubitril-valsartan (ENTRESTO) 49-51 MG Take 1 tablet by mouth 2 (two) times daily. 180 tablet 3   sodium polystyrene (KAYEXALATE) 15 GM/60ML suspension 60 ml po x 4 days 240 mL 0   [START ON 06/19/2021] ALPRAZolam (XANAX) 0.25  MG tablet TAKE ONE-HALF TO ONE TABLET BY MOUTH ONE TIME DAILY AS NEEDED FOR ANXIETY 30 tablet 2   buPROPion (WELLBUTRIN XL) 150 MG 24 hr tablet Take 1 tablet (150 mg total) by mouth daily. 90 tablet 0   DULoxetine (CYMBALTA) 60 MG capsule Take 1 capsule (60 mg total) by mouth daily. 90 capsule 0   methotrexate (RHEUMATREX) 2.5 MG tablet Take 12.5 mg by mouth once a week. On Wednesday (Patient not taking: No sig reported)  3   mycophenolate (CELLCEPT) 500 MG tablet Take 1,000 mg by mouth 2 (two) times daily. (Patient not taking: No sig reported)     nitroGLYCERIN (NITROSTAT) 0.4 MG SL tablet Place 1 tablet (0.4 mg total) under the tongue every 5 (five) minutes as needed for chest pain. 100 tablet 3    ofloxacin (OCUFLOX) 0.3 % ophthalmic solution Place 1 drop into both eyes as needed (Before procedure eye injections). Prior to procedures.     [START ON 06/19/2021] zolpidem (AMBIEN CR) 12.5 MG CR tablet TAKE ONE TABLET BY MOUTH ONE TIME DAILY AT BEDTIME AS NEEDED FOR SLEEP 30 tablet 2   No current facility-administered medications for this visit.    Medication Side Effects: None  Allergies:  Allergies  Allergen Reactions   Celecoxib Other (See Comments)    hyperkalemia   Losartan Other (See Comments)    hyperkalemia   Ramipril Cough         Past Medical History:  Diagnosis Date   CAD (coronary artery disease) 09/2007   s/p CABG approximately 2004-[facility] (Cath - 17 Sep 2007:90% prox LAD with diffuse 85% narrowing to distal segment, 80% prox CXA, RCA: prox 90%, prox-mid 70%, mid 85%, distal 80%, D1 80%; Normal EF 65% --status post CABG--LIMA to LAD, SVG to PDA.     COPD (chronic obstructive pulmonary disease) (HCC)    Depression    Diabetes mellitus without complication (HCC)    Hypertension    Ischemic cardiomyopathy    Pleural effusion    S/P CABG x 2    CABG--LIMA to LAD, SVG to PDA.      Past Medical History, Surgical history, Social history, and Family history were reviewed and updated as appropriate.   Please see review of systems for further details on the patient's review from today.   Objective:   Physical Exam:  There were no vitals taken for this visit.  Physical Exam Constitutional:      General: He is not in acute distress. Musculoskeletal:        General: No deformity.  Neurological:     Mental Status: He is alert and oriented to person, place, and time.     Coordination: Coordination normal.  Psychiatric:        Attention and Perception: Attention and perception normal. He does not perceive auditory or visual hallucinations.        Mood and Affect: Affect is not labile, blunt, angry or inappropriate.        Speech: Speech normal.        Behavior:  Behavior normal.        Thought Content: Thought content normal. Thought content is not paranoid or delusional. Thought content does not include homicidal or suicidal ideation. Thought content does not include homicidal or suicidal plan.        Cognition and Memory: Cognition and memory normal.        Judgment: Judgment normal.     Comments: Insight intact Mood presents as less depressed compared  to last exam. Affect is appropriate to content.     Lab Review:     Component Value Date/Time   NA 140 05/12/2021 1607   NA 140 07/08/2018 0926   K 5.8 (H) 05/12/2021 1607   CL 107 05/12/2021 1607   CO2 24 05/12/2021 1607   GLUCOSE 92 05/12/2021 1607   BUN 45 (H) 05/12/2021 1607   BUN 27 07/08/2018 0926   CREATININE 0.97 05/12/2021 1607   CALCIUM 8.9 05/12/2021 1607   PROT 6.8 05/12/2021 1607   ALBUMIN 4.5 11/18/2020 1129   AST 35 05/12/2021 1607   AST 33 11/18/2020 1129   ALT 67 (H) 05/12/2021 1607   ALT 59 (H) 11/18/2020 1129   ALKPHOS 58 11/18/2020 1129   BILITOT 0.3 05/12/2021 1607   BILITOT 0.4 11/18/2020 1129   GFRNONAA >60 05/09/2021 1040   GFRNONAA >60 11/18/2020 1129   GFRAA >60 08/16/2020 1019       Component Value Date/Time   WBC 6.1 02/16/2021 1108   WBC 6.8 02/12/2020 1601   RBC 4.48 02/16/2021 1108   RBC 4.45 02/16/2021 1108   HGB 12.6 (L) 02/16/2021 1108   HCT 40.6 02/16/2021 1108   PLT 198 02/16/2021 1108   MCV 91.2 02/16/2021 1108   MCH 28.3 02/16/2021 1108   MCHC 31.0 02/16/2021 1108   RDW 13.2 02/16/2021 1108   LYMPHSABS 1.0 02/16/2021 1108   MONOABS 0.6 02/16/2021 1108   EOSABS 0.2 02/16/2021 1108   BASOSABS 0.0 02/16/2021 1108    No results found for: POCLITH, LITHIUM   No results found for: PHENYTOIN, PHENOBARB, VALPROATE, CBMZ   .res Assessment: Plan:   Pt seen for 30 minutes and time spent discussing treatment plan. Discussed that more time is needed to evaluate response to recent re-start of Wellbutrin XL. Discussed that Wellbutrin XL has  seemed to be effective for his mood, energy, and motivation in the past. Will continue Wellbutrin XL 150 mg po qd for depression  Continue Cymbalta 60 mg po qd for depression and anxiety.  Continue Ambien CR 12.5 mg po QHS prn insomnia.  Continue Xanax prn anxiety.  Pt to follow-up in 2 months or sooner if clinically indicated.  Patient advised to contact office with any questions, adverse effects, or acute worsening in signs and symptoms.  Thelbert was seen today for depression and anxiety.  Diagnoses and all orders for this visit:  Generalized anxiety disorder -     DULoxetine (CYMBALTA) 60 MG capsule; Take 1 capsule (60 mg total) by mouth daily. -     ALPRAZolam (XANAX) 0.25 MG tablet; TAKE ONE-HALF TO ONE TABLET BY MOUTH ONE TIME DAILY AS NEEDED FOR ANXIETY  Recurrent major depressive disorder, in partial remission (HCC) -     DULoxetine (CYMBALTA) 60 MG capsule; Take 1 capsule (60 mg total) by mouth daily. -     buPROPion (WELLBUTRIN XL) 150 MG 24 hr tablet; Take 1 tablet (150 mg total) by mouth daily.  Primary insomnia -     zolpidem (AMBIEN CR) 12.5 MG CR tablet; TAKE ONE TABLET BY MOUTH ONE TIME DAILY AT BEDTIME AS NEEDED FOR SLEEP    Please see After Visit Summary for patient specific instructions.  Future Appointments  Date Time Provider Department Center  05/22/2021  8:45 AM [facility]  06/19/2021 11:15 AM [facility] None  06/19/2021 11:45 AM [place], Brand Males, NP [facility] None  06/21/2021 10:45 AM Drema Halon, MD [facility]NT[facility] None  07/17/2021 10:30 AM Montez Morita,  Shanda Bumps, PMHNP [facility] None  07/28/2021  9:00 AM [facility] 1 [facility]  07/28/2021 10:40 AM Bensimhon, Bevelyn Buckles, MD [facility] None    No orders of the defined types were placed in this encounter.   -------------------------------

## 2021-05-19 ENCOUNTER — Telehealth: Payer: Self-pay | Admitting: *Deleted

## 2021-05-19 NOTE — Telephone Encounter (Signed)
Pt has lab appointment Monday morning but I do not see any lab orders in Epic.  Please place orders if appropriate or call pt to cancel appt.

## 2021-05-22 ENCOUNTER — Other Ambulatory Visit (INDEPENDENT_AMBULATORY_CARE_PROVIDER_SITE_OTHER): Payer: Medicare Other

## 2021-05-22 ENCOUNTER — Other Ambulatory Visit: Payer: Self-pay

## 2021-05-22 ENCOUNTER — Other Ambulatory Visit: Payer: Self-pay | Admitting: Family Medicine

## 2021-05-22 DIAGNOSIS — E875 Hyperkalemia: Secondary | ICD-10-CM

## 2021-05-22 LAB — BASIC METABOLIC PANEL
BUN: 32 mg/dL — ABNORMAL HIGH (ref 6–23)
CO2: 29 mEq/L (ref 19–32)
Calcium: 9 mg/dL (ref 8.4–10.5)
Chloride: 102 mEq/L (ref 96–112)
Creatinine, Ser: 0.93 mg/dL (ref 0.40–1.50)
GFR: 81.69 mL/min (ref >60.00)
Glucose, Bld: 192 mg/dL — ABNORMAL HIGH (ref 70–99)
Potassium: 5 mEq/L (ref 3.5–5.1)
Sodium: 139 mEq/L (ref 135–145)

## 2021-05-22 NOTE — Telephone Encounter (Signed)
Orders placed.

## 2021-05-25 ENCOUNTER — Other Ambulatory Visit (HOSPITAL_COMMUNITY): Payer: Self-pay | Admitting: *Deleted

## 2021-05-25 MED ORDER — ENTRESTO 49-51 MG PO TABS: 1.0000 | ORAL_TABLET | Freq: Two times a day (BID) | ORAL | 3 refills | Status: DC

## 2021-06-13 ENCOUNTER — Other Ambulatory Visit (HOSPITAL_COMMUNITY): Payer: Self-pay | Admitting: Unknown Physician Specialty

## 2021-06-13 MED ORDER — EMPAGLIFLOZIN 10 MG PO TABS: 10.0000 mg | ORAL_TABLET | Freq: Every day | ORAL | 3 refills | Status: DC

## 2021-06-14 ENCOUNTER — Other Ambulatory Visit (HOSPITAL_COMMUNITY): Payer: Self-pay | Admitting: *Deleted

## 2021-06-14 DIAGNOSIS — I5022 Chronic systolic (congestive) heart failure: Secondary | ICD-10-CM

## 2021-06-14 MED ORDER — EMPAGLIFLOZIN 10 MG PO TABS: 10.0000 mg | ORAL_TABLET | Freq: Every day | ORAL | 3 refills | Status: AC

## 2021-06-19 ENCOUNTER — Telehealth: Payer: Self-pay

## 2021-06-19 ENCOUNTER — Inpatient Hospital Stay (HOSPITAL_BASED_OUTPATIENT_CLINIC_OR_DEPARTMENT_OTHER): Payer: Medicare Other | Admitting: Family

## 2021-06-19 ENCOUNTER — Inpatient Hospital Stay: Payer: Medicare Other | Attending: Family

## 2021-06-19 ENCOUNTER — Encounter: Payer: Self-pay | Admitting: Family

## 2021-06-19 ENCOUNTER — Other Ambulatory Visit: Payer: Self-pay

## 2021-06-19 VITALS — BP 124/55 | HR 75 | Temp 97.6°F | Resp 18 | Ht 70.0 in | Wt 187.0 lb

## 2021-06-19 DIAGNOSIS — Z9884 Bariatric surgery status: Secondary | ICD-10-CM | POA: Insufficient documentation

## 2021-06-19 DIAGNOSIS — D508 Other iron deficiency anemias: Secondary | ICD-10-CM | POA: Diagnosis not present

## 2021-06-19 DIAGNOSIS — D509 Iron deficiency anemia, unspecified: Secondary | ICD-10-CM | POA: Insufficient documentation

## 2021-06-19 LAB — CBC WITH DIFFERENTIAL (CANCER CENTER ONLY)
Abs Immature Granulocytes: 0.01 10*3/uL (ref 0.00–0.07)
Basophils Absolute: 0 10*3/uL (ref 0.0–0.1)
Basophils Relative: 1 %
Eosinophils Absolute: 0.2 10*3/uL (ref 0.0–0.5)
Eosinophils Relative: 3 %
HCT: 42.9 % (ref 39.0–52.0)
Hemoglobin: 13.3 g/dL (ref 13.0–17.0)
Immature Granulocytes: 0 %
Lymphocytes Relative: 25 %
Lymphs Abs: 1.6 10*3/uL (ref 0.7–4.0)
MCH: 28.2 pg (ref 26.0–34.0)
MCHC: 31 g/dL (ref 30.0–36.0)
MCV: 91.1 fL (ref 80.0–100.0)
Monocytes Absolute: 0.7 10*3/uL (ref 0.1–1.0)
Monocytes Relative: 11 %
Neutro Abs: 4.1 10*3/uL (ref 1.7–7.7)
Neutrophils Relative %: 60 %
Platelet Count: 225 10*3/uL (ref 150–400)
RBC: 4.71 MIL/uL (ref 4.22–5.81)
RDW: 12.9 % (ref 11.5–15.5)
WBC Count: 6.7 10*3/uL (ref 4.0–10.5)
nRBC: 0 % (ref 0.0–0.2)

## 2021-06-19 LAB — RETICULOCYTES
Immature Retic Fract: 10.8 % (ref 2.3–15.9)
RBC.: 4.71 MIL/uL (ref 4.22–5.81)
Retic Count, Absolute: 51.3 10*3/uL (ref 19.0–186.0)
Retic Ct Pct: 1.1 % (ref 0.4–3.1)

## 2021-06-19 NOTE — Progress Notes (Signed)
Hematology and Oncology Follow Up Visit  Alec Taylor [id number] 21-Sep-1946 75 y.o. 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 He doing quite well and had a wonderful time at his daughter's wedding.  Hgb is 13.3, MCV 91, platelets 225 and WBC count 6.7.  He has some mild SOB and fatigue with over exertion and takes a break to rest when needed.  No fever, chills, n/v, cough, rash, dizziness, chest pain, palpitations, abdominal pain or changes in bowel or bladder habits.  He has not noted any blood loss. No abnormal bruising or petechiae.  No swelling, tenderness, numbness or tingling in her extremities.  No falls or syncope.  He has maintained a good appetite and is staying well hydrated. His weight is 187 lbs.   ECOG Performance Status: 1 - Symptomatic but completely ambulatory  Medications:  Allergies as of 06/19/2021       Reactions   Celecoxib Other (See Comments)   hyperkalemia   Losartan Other (See Comments)   hyperkalemia   Ramipril Cough           Medication List        Accurate as of June 19, 2021 11:40 AM. If you have any questions, ask your nurse or doctor.          ALPRAZolam 0.25 MG tablet Commonly known as: XANAX TAKE ONE-HALF TO ONE TABLET BY MOUTH ONE TIME DAILY AS NEEDED FOR ANXIETY   amoxicillin-clavulanate 500-125 MG tablet Commonly known as: AUGMENTIN Take by mouth.   aspirin EC 81 MG tablet Take 81 mg by mouth at bedtime.   atorvastatin 20 MG tablet Commonly known as: LIPITOR Take 1 tablet (20 mg total) by mouth daily. Please make an appointment for further refills   buPROPion 150 MG 24 hr tablet Commonly known as: WELLBUTRIN XL Take 1 tablet (150 mg total) by mouth daily.   carvedilol 6.25 MG tablet Commonly known as: COREG Take 1 tablet (6.25 mg total) by mouth 2 (two) times daily with a  meal.   COVID-19 ANTIBODY TEST VI   DULoxetine 60 MG capsule Commonly known as: CYMBALTA Take 1 capsule (60 mg total) by mouth daily.   empagliflozin 10 MG Tabs tablet Commonly known as: Jardiance Take 1 tablet (10 mg total) by mouth daily. Please make an appointment for refills   Entresto 49-51 MG Generic drug: sacubitril-valsartan Take 1 tablet by mouth 2 (two) times daily.   finasteride 5 MG tablet Commonly known as: PROSCAR Take 5 mg by mouth daily.   folic acid 1 MG tablet Commonly known as: FOLVITE Take 1 mg by mouth daily.   furosemide 20 MG tablet Commonly known as: Lasix Take 1 tablet (20 mg total) by mouth every Monday, Wednesday, and Friday.   glimepiride 2 MG tablet Commonly known as: AMARYL Take 2 mg by mouth every evening.   metFORMIN 500 MG 24 hr tablet Commonly known as: GLUCOPHAGE-XR Take 500 mg by mouth 2 (two) times daily.   methotrexate 2.5 MG tablet Commonly known as: RHEUMATREX Take 12.5 mg by mouth once a week. On Wednesday   mycophenolate 500 MG tablet Commonly known as: CELLCEPT Take 1,000 mg by mouth 2 (two) times daily.   nitroGLYCERIN 0.4 MG SL tablet Commonly known as: Nitrostat Place 1 tablet (0.4 mg total) under the tongue every 5 (five) minutes as needed for  chest pain.   ofloxacin 0.3 % ophthalmic solution Commonly known as: OCUFLOX Place 1 drop into both eyes as needed (Before procedure eye injections). Prior to procedures.   polyvinyl alcohol 1.4 % ophthalmic solution Commonly known as: LIQUIFILM TEARS 1 drop as needed.   sodium polystyrene 15 GM/60ML suspension Commonly known as: KAYEXALATE 60 ml po x 4 days   Vitamin D3 125 MCG (5000 UT) Caps Take 5,000 Units by mouth daily.   zolpidem 12.5 MG CR tablet Commonly known as: AMBIEN CR TAKE ONE TABLET BY MOUTH ONE TIME DAILY AT BEDTIME AS NEEDED FOR SLEEP        Allergies:  Allergies  Allergen Reactions   Celecoxib Other (See Comments)    hyperkalemia    Losartan Other (See Comments)    hyperkalemia   Ramipril Cough         Past Medical History, Surgical history, Social history, and Family History were reviewed and updated.  Review of Systems: All other 10 point review of systems is negative.   Physical Exam:  vitals were not taken for this visit.   Wt Readings from Last 3 Encounters:  05/12/21 191 lb (86.6 kg)  05/09/21 187 lb 9.6 oz (85.1 kg)  03/17/21 196 lb (88.9 kg)    Ocular: Sclerae unicteric, pupils equal, round and reactive to light Ear-nose-throat: Oropharynx clear, dentition fair Lymphatic: No cervical or supraclavicular adenopathy Lungs no rales or rhonchi, good excursion bilaterally Heart regular rate and rhythm, no murmur appreciated Abd soft, nontender, positive bowel sounds MSK no focal spinal tenderness, no joint edema Neuro: non-focal, well-oriented, appropriate affect Breasts: Deferred   Lab Results  Component Value Date   WBC 6.1 02/16/2021   HGB 12.6 (L) 02/16/2021   HCT 40.6 02/16/2021   MCV 91.2 02/16/2021   PLT 198 02/16/2021   Lab Results  Component Value Date   FERRITIN 102 02/16/2021   IRON 63 02/16/2021   TIBC 338 02/16/2021   UIBC 275 02/16/2021   IRONPCTSAT 19 (L) 02/16/2021   Lab Results  Component Value Date   RETICCTPCT 2.0 02/16/2021   RBC 4.48 02/16/2021   RBC 4.45 02/16/2021   No results found for: KPAFRELGTCHN, LAMBDASER, KAPLAMBRATIO No results found for: IGGSERUM, IGA, IGMSERUM No results found for: Marda Stalker, SPEI   Chemistry      Component Value Date/Time   NA 139 05/22/2021 0827   NA 140 07/08/2018 0926   K 5.0 05/22/2021 0827   CL 102 05/22/2021 0827   CO2 29 05/22/2021 0827   BUN 32 (H) 05/22/2021 0827   BUN 27 07/08/2018 0926   CREATININE 0.93 05/22/2021 0827   CREATININE 0.97 05/12/2021 1607      Component Value Date/Time   CALCIUM 9.0 05/22/2021 0827   ALKPHOS 58 11/18/2020 1129   AST 35  05/12/2021 1607   AST 33 11/18/2020 1129   ALT 67 (H) 05/12/2021 1607   ALT 59 (H) 11/18/2020 1129   BILITOT 0.3 05/12/2021 1607   BILITOT 0.4 11/18/2020 1129       Impression and Plan: Alec Taylor with history of iron deficiency anemia secondary to malabsorption after gastric bypass.  Iron studies are pending. We will replace if needed.  Follow-up in 4 months.  He can contact our office with any questions or concerns.   Emeline Gins, NP 7/11/202211:40 AM

## 2021-06-20 ENCOUNTER — Encounter: Payer: Self-pay | Admitting: Family

## 2021-06-20 LAB — IRON AND TIBC
Iron: 78 ug/dL (ref 42–163)
Saturation Ratios: 22 % (ref 20–55)
TIBC: 358 ug/dL (ref 202–409)
UIBC: 279 ug/dL (ref 117–376)

## 2021-06-20 LAB — FERRITIN: Ferritin: 92 ng/mL (ref 24–336)

## 2021-06-21 ENCOUNTER — Other Ambulatory Visit: Payer: Self-pay

## 2021-06-21 ENCOUNTER — Ambulatory Visit (INDEPENDENT_AMBULATORY_CARE_PROVIDER_SITE_OTHER): Payer: Medicare Other | Admitting: Otolaryngology

## 2021-06-21 DIAGNOSIS — J320 Chronic maxillary sinusitis: Secondary | ICD-10-CM

## 2021-06-21 DIAGNOSIS — I2510 Atherosclerotic heart disease of native coronary artery without angina pectoris: Secondary | ICD-10-CM

## 2021-06-21 MED ORDER — AMOXICILLIN-POT CLAVULANATE 875-125 MG PO TABS: 1.0000 | ORAL_TABLET | Freq: Two times a day (BID) | ORAL | 0 refills | Status: DC

## 2021-06-21 NOTE — Progress Notes (Signed)
HPI: Alec Taylor is a 75 y.o. male who presents is referred by his PCP for evaluation of left maxillary sinus infection.  He apparently had a bad upper tooth ache on the left side 5 weeks ago.  He was seen by dentist and referred to another dentist who performed a MRI scan that showed some questionable sinus disease and recommended follow-up with ENT.  He was treated with Augmentin at that time because he had a lot of pain in the left cheek area and this is doing better. He does have a little bit of a cough but not really a productive cough.  Denies any significant discharge from his nose.  He has had previous septoplasty and turbinate reductions. .  Past Medical History:  Diagnosis Date   CAD (coronary artery disease) 09/2007   s/p CABG approximately 2004-[facility] (Cath - 17 Sep 2007:90% prox LAD with diffuse 85% narrowing to distal segment, 80% prox CXA, RCA: prox 90%, prox-mid 70%, mid 85%, distal 80%, D1 80%; Normal EF 65% --status post CABG--LIMA to LAD, SVG to PDA.     COPD (chronic obstructive pulmonary disease) (HCC)    Depression    Diabetes mellitus without complication (HCC)    Hypertension    Ischemic cardiomyopathy    Pleural effusion    S/P CABG x 2    CABG--LIMA to LAD, SVG to PDA.     Past Surgical History:  Procedure Laterality Date   APPENDECTOMY     CHOLECYSTECTOMY     CORONARY ARTERY BYPASS GRAFT     GASTRIC BYPASS     LEFT HEART CATH AND CORS/GRAFTS ANGIOGRAPHY N/A 04/30/2018   Procedure: LEFT HEART CATH AND CORS/GRAFTS ANGIOGRAPHY;  Surgeon: Marykay Lex, MD;  Location: [facility] INVASIVE CV LAB;  Service: Cardiovascular;  Laterality: N/A;   RIGHT/LEFT HEART CATH AND CORONARY/GRAFT ANGIOGRAPHY N/A 02/18/2020   Procedure: RIGHT/LEFT HEART CATH AND CORONARY/GRAFT ANGIOGRAPHY;  Surgeon: Dolores Patty, MD;  Location: [facility] INVASIVE CV LAB;  Service: Cardiovascular;  Laterality: N/A;   TOE AMPUTATION     Social History   Socioeconomic History   Marital status: Legally  Separated    Spouse name: Not on file   Number of children: Not on file   Years of education: Not on file   Highest education level: Not on file  Occupational History   Not on file  Tobacco Use   Smoking status: Never   Smokeless tobacco: Never  Vaping Use   Vaping Use: Never used  Substance and Sexual Activity   Alcohol use: Yes    Alcohol/week: 2.0 standard drinks    Types: 2 Cans of beer per week    Comment: occ   Drug use: Never   Sexual activity: Not on file  Other Topics Concern   Not on file  Social History Narrative   Not on file   Social Determinants of Health   Financial Resource Strain: Not on file  Food Insecurity: Not on file  Transportation Needs: Not on file  Physical Activity: Not on file  Stress: Not on file  Social Connections: Not on file   Family History  Problem Relation Age of Onset   Hypertension Father    Allergies  Allergen Reactions   Celecoxib Other (See Comments)    hyperkalemia   Losartan Other (See Comments)    hyperkalemia   Ramipril Cough        Prior to Admission medications   Medication Sig Start Date End Date Taking? Authorizing Provider  ALPRAZolam (XANAX) 0.25 MG tablet TAKE ONE-HALF TO ONE TABLET BY MOUTH ONE TIME DAILY AS NEEDED FOR ANXIETY 06/19/21   Corie Chiquito, PMHNP  aspirin EC 81 MG tablet Take 81 mg by mouth at bedtime.     [provider]  atorvastatin (LIPITOR) 20 MG tablet Take 1 tablet (20 mg total) by mouth daily. Please make an appointment for further refills 05/15/21   Bensimhon, Bevelyn Buckles, MD  buPROPion (WELLBUTRIN XL) 150 MG 24 hr tablet Take 1 tablet (150 mg total) by mouth daily. 05/16/21   Corie Chiquito, PMHNP  carvedilol (COREG) 6.25 MG tablet Take 1 tablet (6.25 mg total) by mouth 2 (two) times daily with a meal. 04/10/21   Bensimhon, Bevelyn Buckles, MD  Cholecalciferol (VITAMIN D3) 5000 units CAPS Take 5,000 Units by mouth daily.  12/19/17   [provider]  DULoxetine (CYMBALTA) 60 MG capsule  Take 1 capsule (60 mg total) by mouth daily. 05/16/21   Corie Chiquito, PMHNP  empagliflozin (JARDIANCE) 10 MG TABS tablet Take 1 tablet (10 mg total) by mouth daily. Please make an appointment for refills 06/14/21   Bensimhon, Bevelyn Buckles, MD  finasteride (PROSCAR) 5 MG tablet Take 5 mg by mouth daily.  04/20/18   [provider]  folic acid (FOLVITE) 1 MG tablet Take 1 mg by mouth daily. 04/20/18   [provider]  furosemide (LASIX) 20 MG tablet Take 1 tablet (20 mg total) by mouth every Monday, Wednesday, and Friday. 11/25/20   Bensimhon, Bevelyn Buckles, MD  glimepiride (AMARYL) 2 MG tablet Take 2 mg by mouth every evening. 04/20/18   [provider]  metFORMIN (GLUCOPHAGE-XR) 500 MG 24 hr tablet Take 500 mg by mouth 2 (two) times daily. 04/20/18   [provider]  methotrexate (RHEUMATREX) 2.5 MG tablet Take 12.5 mg by mouth once a week. On Wednesday Patient not taking: No sig reported 04/24/18   [provider]  mycophenolate (CELLCEPT) 500 MG tablet Take 1,000 mg by mouth 2 (two) times daily. Patient not taking: No sig reported 02/14/21   [provider]  nitroGLYCERIN (NITROSTAT) 0.4 MG SL tablet Place 1 tablet (0.4 mg total) under the tongue every 5 (five) minutes as needed for chest pain. Patient not taking: Reported on 06/19/2021 05/09/21 05/09/22  Tonye Becket D, NP  ofloxacin (OCUFLOX) 0.3 % ophthalmic solution Place 1 drop into both eyes as needed (Before procedure eye injections). Prior to procedures.    [provider]  polyvinyl alcohol (LIQUIFILM TEARS) 1.4 % ophthalmic solution 1 drop as needed.    [provider]  sacubitril-valsartan (ENTRESTO) 49-51 MG Take 1 tablet by mouth 2 (two) times daily. 05/25/21   Bensimhon, Bevelyn Buckles, MD  zolpidem (AMBIEN CR) 12.5 MG CR tablet TAKE ONE TABLET BY MOUTH ONE TIME DAILY AT BEDTIME AS NEEDED FOR SLEEP 06/19/21   Corie Chiquito, PMHNP     Positive ROS: Otherwise negative  All other systems  have been reviewed and were otherwise negative with the exception of those mentioned in the HPI and as above.  Physical Exam: Constitutional: Alert, well-appearing, no acute distress Ears: External ears without lesions or tenderness. Ear canals are clear bilaterally.  Both TMs are clear with good mobility pneumatic otoscopy.  On hearing screening with a tuning forks he has a mild hearing loss in both ears.  Dix-Hallpike testing revealed no evidence of BPPV. Nasal: External nose without lesions. Septum is midline.  The right middle meatus region is clear.  He has a  small amount of colored discharge from the left middle meatus.  No polyps noted.  On palpation of the maxillary sinus he has slight tenderness on the left side compared to the right but this is minimal. Oral: Lips and gums without lesions. Tongue and palate mucosa without lesions. Posterior oropharynx clear. Neck: No palpable adenopathy or masses Respiratory: Breathing comfortably  Skin: No facial/neck lesions or rash noted.  Procedures  Assessment: Probably resolving left maxillary sinusitis.  He does a small amount of purulent discharge from the left maxillary sinus on clinical exam today.  Plan: Resolving left maxillary sinusitis. Prescribed Augmentin 875 mg twice daily for 10 days since he still has a small amount of purulent discharge from the left middle meatus and has been having a cough.   Narda Bonds, MD   CC:

## 2021-06-28 ENCOUNTER — Other Ambulatory Visit: Payer: Self-pay | Admitting: Psychiatry

## 2021-06-28 DIAGNOSIS — F3341 Major depressive disorder, recurrent, in partial remission: Secondary | ICD-10-CM

## 2021-07-17 ENCOUNTER — Other Ambulatory Visit: Payer: Self-pay

## 2021-07-17 ENCOUNTER — Encounter: Payer: Self-pay | Admitting: Psychiatry

## 2021-07-17 ENCOUNTER — Ambulatory Visit (INDEPENDENT_AMBULATORY_CARE_PROVIDER_SITE_OTHER): Payer: Medicare Other | Admitting: Psychiatry

## 2021-07-17 DIAGNOSIS — F411 Generalized anxiety disorder: Secondary | ICD-10-CM | POA: Diagnosis not present

## 2021-07-17 DIAGNOSIS — F3341 Major depressive disorder, recurrent, in partial remission: Secondary | ICD-10-CM

## 2021-07-17 MED ORDER — DONEPEZIL HCL 5 MG PO TABS: 5.0000 mg | ORAL_TABLET | Freq: Every day | ORAL | 1 refills | Status: DC

## 2021-07-17 MED ORDER — BUPROPION HCL ER (XL) 150 MG PO TB24: 150.0000 mg | ORAL_TABLET | Freq: Every day | ORAL | 1 refills | Status: DC

## 2021-07-17 MED ORDER — DULOXETINE HCL 60 MG PO CPEP: 60.0000 mg | ORAL_CAPSULE | Freq: Every day | ORAL | 0 refills | Status: DC

## 2021-07-17 NOTE — Progress Notes (Signed)
Alec Taylor [id number] 12-Jun-1946 75 y.o.  Subjective:   Patient ID:  Alec Taylor is a 75 y.o. (DOB 1946/10/29) male.  Chief Complaint:  Chief Complaint  Patient presents with   Follow-up    Depression, anxiety, and insomnia    HPI Alec Taylor presents to the office today for follow-up of depression, anxiety, and insomnia. He reports, "I think everything has kind of smoothed out." He reports that he notices some difficulty with word finding. He will frequently misplace things.   He reports that his mood is periodically lower when he thinks about getting older. Denies anxiety. Sleeping well with Ambien. Appetite has been good. He reports that he has been doing all the cooking. Energy and motivation are low unless he has a purpose. Concentration is adequate. Denies SI.   He occasionally plays golf. Will watch TV. Has been trying to limit watching the news. Enjoys working with people and has contemplated volunteering. Enjoys traveling and cooking.   Wife had procedure for bladder cancer and is having ongoing treatments. Learned wife does not have most aggressive form of bladder cancer.   Daughter will start teaching. Has 2 brothers, 24 yo and 42 yo.    Past Psychiatric Medication Trials: Cymbalta Aricept Wellbutrin XL BuSpar- Dizziness Ambien Ambien CR Xanax Klonopin  Review of Systems:  Review of Systems  Musculoskeletal:  Negative for gait problem.  Neurological:  Negative for tremors.  Psychiatric/Behavioral:         Please refer to HPI   Medications: I have reviewed the patient's current medications.  Current Outpatient Medications  Medication Sig Dispense Refill   ALPRAZolam (XANAX) 0.25 MG tablet TAKE ONE-HALF TO ONE TABLET BY MOUTH ONE TIME DAILY AS NEEDED FOR ANXIETY 30 tablet 2   aspirin EC 81 MG tablet Take 81 mg by mouth at bedtime.      atorvastatin (LIPITOR) 20 MG tablet Take 1 tablet (20 mg total) by mouth daily. Please make an appointment for further  refills 30 tablet 6   carvedilol (COREG) 6.25 MG tablet Take 1 tablet (6.25 mg total) by mouth 2 (two) times daily with a meal. 60 tablet 4   Cholecalciferol (VITAMIN D3) 5000 units CAPS Take 5,000 Units by mouth daily.      empagliflozin (JARDIANCE) 10 MG TABS tablet Take 1 tablet (10 mg total) by mouth daily. Please make an appointment for refills 90 tablet 3   finasteride (PROSCAR) 5 MG tablet Take 5 mg by mouth daily.   6   folic acid (FOLVITE) 1 MG tablet Take 1 mg by mouth daily.  2   furosemide (LASIX) 20 MG tablet Take 1 tablet (20 mg total) by mouth every Monday, Wednesday, and Friday. 39 tablet 3   glimepiride (AMARYL) 2 MG tablet Take 2 mg by mouth every evening.  1   metFORMIN (GLUCOPHAGE-XR) 500 MG 24 hr tablet Take 500 mg by mouth 2 (two) times daily.  1   sacubitril-valsartan (ENTRESTO) 49-51 MG Take 1 tablet by mouth 2 (two) times daily. 180 tablet 3   zolpidem (AMBIEN CR) 12.5 MG CR tablet TAKE ONE TABLET BY MOUTH ONE TIME DAILY AT BEDTIME AS NEEDED FOR SLEEP 30 tablet 2   amoxicillin-clavulanate (AUGMENTIN) 875-125 MG tablet Take 1 tablet by mouth 2 (two) times daily. (Patient not taking: Reported on 07/17/2021) 20 tablet 0   buPROPion (WELLBUTRIN XL) 150 MG 24 hr tablet Take 1 tablet (150 mg total) by mouth daily. 90 tablet 1   donepezil (ARICEPT) 5 MG tablet  Take 1 tablet (5 mg total) by mouth daily. 90 tablet 1   DULoxetine (CYMBALTA) 60 MG capsule Take 1 capsule (60 mg total) by mouth daily. 90 capsule 0   methotrexate (RHEUMATREX) 2.5 MG tablet Take 12.5 mg by mouth once a week. On Wednesday (Patient not taking: No sig reported)  3   mycophenolate (CELLCEPT) 500 MG tablet Take 1,000 mg by mouth 2 (two) times daily. (Patient not taking: No sig reported)     nitroGLYCERIN (NITROSTAT) 0.4 MG SL tablet Place 1 tablet (0.4 mg total) under the tongue every 5 (five) minutes as needed for chest pain. (Patient not taking: Reported on 06/19/2021) 100 tablet 3   ofloxacin (OCUFLOX) 0.3 %  ophthalmic solution Place 1 drop into both eyes as needed (Before procedure eye injections). Prior to procedures.     polyvinyl alcohol (LIQUIFILM TEARS) 1.4 % ophthalmic solution 1 drop as needed.     No current facility-administered medications for this visit.    Medication Side Effects: None  Allergies:  Allergies  Allergen Reactions   Celecoxib Other (See Comments)    hyperkalemia   Losartan Other (See Comments)    hyperkalemia   Ramipril Cough         Past Medical History:  Diagnosis Date   CAD (coronary artery disease) 09/2007   s/p CABG approximately 2004-[facility] (Cath - 17 Sep 2007:90% prox LAD with diffuse 85% narrowing to distal segment, 80% prox CXA, RCA: prox 90%, prox-mid 70%, mid 85%, distal 80%, D1 80%; Normal EF 65% --status post CABG--LIMA to LAD, SVG to PDA.     COPD (chronic obstructive pulmonary disease) (HCC)    Depression    Diabetes mellitus without complication (HCC)    Hypertension    Ischemic cardiomyopathy    Pleural effusion    S/P CABG x 2    CABG--LIMA to LAD, SVG to PDA.      Past Medical History, Surgical history, Social history, and Family history were reviewed and updated as appropriate.   Please see review of systems for further details on the patient's review from today.   Objective:   Physical Exam:  Wt 185 lb (83.9 kg)   BMI 26.54 kg/m   Physical Exam Constitutional:      General: He is not in acute distress. Musculoskeletal:        General: No deformity.  Neurological:     Mental Status: He is alert and oriented to person, place, and time.     Coordination: Coordination normal.  Psychiatric:        Attention and Perception: Attention and perception normal. He does not perceive auditory or visual hallucinations.        Mood and Affect: Mood normal. Mood is not anxious or depressed. Affect is not labile, blunt, angry or inappropriate.        Speech: Speech normal.        Behavior: Behavior normal.        Thought Content:  Thought content normal. Thought content is not paranoid or delusional. Thought content does not include homicidal or suicidal ideation. Thought content does not include homicidal or suicidal plan.        Cognition and Memory: Cognition and memory normal.        Judgment: Judgment normal.     Comments: Insight intact    Lab Review:     Component Value Date/Time   NA 139 05/22/2021 0827   NA 140 07/08/2018 0926   K 5.0 05/22/2021 0827  CL 102 05/22/2021 0827   CO2 29 05/22/2021 0827   GLUCOSE 192 (H) 05/22/2021 0827   BUN 32 (H) 05/22/2021 0827   BUN 27 07/08/2018 0926   CREATININE 0.93 05/22/2021 0827   CREATININE 0.97 05/12/2021 1607   CALCIUM 9.0 05/22/2021 0827   PROT 6.8 05/12/2021 1607   ALBUMIN 4.5 11/18/2020 1129   AST 35 05/12/2021 1607   AST 33 11/18/2020 1129   ALT 67 (H) 05/12/2021 1607   ALT 59 (H) 11/18/2020 1129   ALKPHOS 58 11/18/2020 1129   BILITOT 0.3 05/12/2021 1607   BILITOT 0.4 11/18/2020 1129   GFRNONAA >60 05/09/2021 1040   GFRNONAA >60 11/18/2020 1129   GFRAA >60 08/16/2020 1019       Component Value Date/Time   WBC 6.7 06/19/2021 1119   WBC 6.8 02/12/2020 1601   RBC 4.71 06/19/2021 1119   RBC 4.71 06/19/2021 1119   HGB 13.3 06/19/2021 1119   HCT 42.9 06/19/2021 1119   PLT 225 06/19/2021 1119   MCV 91.1 06/19/2021 1119   MCH 28.2 06/19/2021 1119   MCHC 31.0 06/19/2021 1119   RDW 12.9 06/19/2021 1119   LYMPHSABS 1.6 06/19/2021 1119   MONOABS 0.7 06/19/2021 1119   EOSABS 0.2 06/19/2021 1119   BASOSABS 0.0 06/19/2021 1119    No results found for: POCLITH, LITHIUM   No results found for: PHENYTOIN, PHENOBARB, VALPROATE, CBMZ   .res Assessment: Plan:   Pt reports concern about preventing cognitive decline and asks about re-starting Aricept, which is stopped on his own about 2 years ago. MMSE completed at time of exam and pt scored 30/30 possible points. Will re-start Aricept 5 mg po qd to prevent cognitive decline since he tolerated this  well in the past. Discussed that Aricept has a low risk of causing QT prolongation and recommend that he discuss risk with cardiologist at his appointment with cardiology this week.  Continue Cymbalta 60 mg po qd for anxiety and depression.  Continue Wellbutrin XL 150 mg po qd for depression.  Continue Xanax 0.25 mg 1/2-1 tab po qd prn anxiety. Pt has refills on file.  Continue Ambien CR 12.5 mg po QHS prn insomnia.  Pt to follow-up in 3 months or sooner if clinically indicated.  Patient advised to contact office with any questions, adverse effects, or acute worsening in signs and symptoms.    Alec Taylor was seen today for follow-up.  Diagnoses and all orders for this visit:  Recurrent major depressive disorder, in partial remission (HCC) -     buPROPion (WELLBUTRIN XL) 150 MG 24 hr tablet; Take 1 tablet (150 mg total) by mouth daily. -     DULoxetine (CYMBALTA) 60 MG capsule; Take 1 capsule (60 mg total) by mouth daily.  Generalized anxiety disorder -     DULoxetine (CYMBALTA) 60 MG capsule; Take 1 capsule (60 mg total) by mouth daily.  Other orders -     donepezil (ARICEPT) 5 MG tablet; Take 1 tablet (5 mg total) by mouth daily.    Please see After Visit Summary for patient specific instructions.  Future Appointments  Date Time Provider Department Center  07/28/2021  9:00 AM [facility] OP 1 [facility]  07/28/2021 10:40 AM Bensimhon, Bevelyn Buckles, MD [facility] None  10/17/2021  9:30 AM Corie Chiquito, PMHNP [facility] None  10/23/2021 10:15 AM [facility] None  10/23/2021 10:45 AM [place], Brand Males, NP [facility] None    No orders of the defined types were placed in this encounter.   -------------------------------

## 2021-07-19 ENCOUNTER — Other Ambulatory Visit (HOSPITAL_COMMUNITY): Payer: Self-pay | Admitting: Internal Medicine

## 2021-07-28 ENCOUNTER — Ambulatory Visit (HOSPITAL_BASED_OUTPATIENT_CLINIC_OR_DEPARTMENT_OTHER)
Admission: RE | Admit: 2021-07-28 | Discharge: 2021-07-28 | Disposition: A | Discharge status: Home / Self Care | Payer: Medicare Other | Source: Ambulatory Visit | Attending: Internal Medicine | Admitting: Internal Medicine

## 2021-07-28 ENCOUNTER — Ambulatory Visit (HOSPITAL_COMMUNITY)
Admission: RE | Admit: 2021-07-28 | Discharge: 2021-07-28 | Disposition: A | Discharge status: Home / Self Care | Payer: Medicare Other | Source: Ambulatory Visit | Attending: Family Medicine | Admitting: Family Medicine

## 2021-07-28 ENCOUNTER — Other Ambulatory Visit: Payer: Self-pay

## 2021-07-28 ENCOUNTER — Encounter (HOSPITAL_COMMUNITY): Payer: Self-pay | Admitting: Internal Medicine

## 2021-07-28 VITALS — BP 100/57 | HR 64 | Wt 195.2 lb

## 2021-07-28 DIAGNOSIS — I5022 Chronic systolic (congestive) heart failure: Secondary | ICD-10-CM | POA: Insufficient documentation

## 2021-07-28 DIAGNOSIS — Z6828 Body mass index (BMI) 28.0-28.9, adult: Secondary | ICD-10-CM | POA: Insufficient documentation

## 2021-07-28 DIAGNOSIS — E785 Hyperlipidemia, unspecified: Secondary | ICD-10-CM | POA: Diagnosis not present

## 2021-07-28 DIAGNOSIS — Z9884 Bariatric surgery status: Secondary | ICD-10-CM | POA: Insufficient documentation

## 2021-07-28 DIAGNOSIS — J90 Pleural effusion, not elsewhere classified: Secondary | ICD-10-CM | POA: Insufficient documentation

## 2021-07-28 DIAGNOSIS — Z79899 Other long term (current) drug therapy: Secondary | ICD-10-CM | POA: Diagnosis not present

## 2021-07-28 DIAGNOSIS — I110 Hypertensive heart disease with heart failure: Secondary | ICD-10-CM | POA: Insufficient documentation

## 2021-07-28 DIAGNOSIS — G4719 Other hypersomnia: Secondary | ICD-10-CM

## 2021-07-28 DIAGNOSIS — Z7982 Long term (current) use of aspirin: Secondary | ICD-10-CM | POA: Insufficient documentation

## 2021-07-28 DIAGNOSIS — Z8249 Family history of ischemic heart disease and other diseases of the circulatory system: Secondary | ICD-10-CM | POA: Diagnosis not present

## 2021-07-28 DIAGNOSIS — I255 Ischemic cardiomyopathy: Secondary | ICD-10-CM | POA: Insufficient documentation

## 2021-07-28 DIAGNOSIS — Z7984 Long term (current) use of oral hypoglycemic drugs: Secondary | ICD-10-CM | POA: Insufficient documentation

## 2021-07-28 DIAGNOSIS — I2510 Atherosclerotic heart disease of native coronary artery without angina pectoris: Secondary | ICD-10-CM

## 2021-07-28 DIAGNOSIS — I5021 Acute systolic (congestive) heart failure: Secondary | ICD-10-CM | POA: Diagnosis not present

## 2021-07-28 DIAGNOSIS — E119 Type 2 diabetes mellitus without complications: Secondary | ICD-10-CM | POA: Insufficient documentation

## 2021-07-28 DIAGNOSIS — J449 Chronic obstructive pulmonary disease, unspecified: Secondary | ICD-10-CM | POA: Insufficient documentation

## 2021-07-28 DIAGNOSIS — E669 Obesity, unspecified: Secondary | ICD-10-CM | POA: Insufficient documentation

## 2021-07-28 DIAGNOSIS — Z888 Allergy status to other drugs, medicaments and biological substances status: Secondary | ICD-10-CM | POA: Insufficient documentation

## 2021-07-28 DIAGNOSIS — Z951 Presence of aortocoronary bypass graft: Secondary | ICD-10-CM | POA: Insufficient documentation

## 2021-07-28 LAB — IRON AND TIBC
Iron: 71 ug/dL (ref 45–182)
Saturation Ratios: 20 % (ref 17.9–39.5)
TIBC: 356 ug/dL (ref 250–450)
UIBC: 285 ug/dL

## 2021-07-28 LAB — ECHOCARDIOGRAM COMPLETE
Area-P 1/2: 3.42 cm2
Calc EF: 52.9 %
S' Lateral: 3.8 cm
Single Plane A2C EF: 57.2 %
Single Plane A4C EF: 43.9 %

## 2021-07-28 LAB — CBC
HCT: 40.7 % (ref 39.0–52.0)
Hemoglobin: 12.8 g/dL — ABNORMAL LOW (ref 13.0–17.0)
MCH: 28.4 pg (ref 26.0–34.0)
MCHC: 31.4 g/dL (ref 30.0–36.0)
MCV: 90.4 fL (ref 80.0–100.0)
Platelets: 248 10*3/uL (ref 150–400)
RBC: 4.5 MIL/uL (ref 4.22–5.81)
RDW: 13.5 % (ref 11.5–15.5)
WBC: 7 10*3/uL (ref 4.0–10.5)
nRBC: 0 % (ref 0.0–0.2)

## 2021-07-28 LAB — BASIC METABOLIC PANEL
Anion gap: 7 (ref 5–15)
BUN: 38 mg/dL — ABNORMAL HIGH (ref 8–23)
CO2: 26 mmol/L (ref 22–32)
Calcium: 8.9 mg/dL (ref 8.9–10.3)
Chloride: 105 mmol/L (ref 98–111)
Creatinine, Ser: 0.97 mg/dL (ref 0.61–1.24)
GFR, Estimated: >60 mL/min (ref >60)
Glucose, Bld: 171 mg/dL — ABNORMAL HIGH (ref 70–99)
Potassium: 5.6 mmol/L — ABNORMAL HIGH (ref 3.5–5.1)
Sodium: 138 mmol/L (ref 135–145)

## 2021-07-28 LAB — BRAIN NATRIURETIC PEPTIDE: B Natriuretic Peptide: 425.6 pg/mL — ABNORMAL HIGH (ref 0.0–100.0)

## 2021-07-28 LAB — FERRITIN: Ferritin: 68 ng/mL (ref 24–336)

## 2021-07-28 NOTE — Progress Notes (Signed)
  Echocardiogram 2D Echocardiogram has been performed.  Tichina Koebel G Tildon Silveria 07/28/2021, 10:10 AM

## 2021-07-28 NOTE — Progress Notes (Signed)
ReDS Vest / Clip - 07/28/21 1100       ReDS Vest / Clip   Station Marker C    Ruler Value 28.5    ReDS Value Range Moderate volume overload    ReDS Actual Value 40

## 2021-07-28 NOTE — Progress Notes (Signed)
Height:  5'10"    Weight: 195 lb BMI: 26.83  Today's Date: 07/28/21  STOP BANG RISK ASSESSMENT S (snore) Have you been told that you snore?     YES   T (tired) Are you often tired, fatigued, or sleepy during the day?   YES  O (obstruction) Do you stop breathing, choke, or gasp during sleep? NO   P (pressure) Do you have or are you being treated for high blood pressure? YES   B (BMI) Is your body index greater than 35 kg/m? NO   A (age) Are you 73 years old or older? YES   N (neck) Do you have a neck circumference greater than 16 inches?      G (gender) Are you a male? YES   TOTAL STOP/BANG "YES" ANSWERS 5                                                                       For Office Use Only              Procedure Order Form    YES to 3+ Stop Bang questions OR two clinical symptoms - patient qualifies for WatchPAT (CPT 95800)      Clinical Notes: Will consult Sleep Specialist and refer for management of therapy due to patient increased risk of Sleep Apnea. Ordering a sleep study due to the following two clinical symptoms: Excessive daytime sleepiness G47.10 / Loud snoring R06.83

## 2021-07-28 NOTE — Patient Instructions (Addendum)
Increase Furosemide for 2 DOSES ONLY  Labs done today, your results will be available in MyChart, we will contact you for abnormal readings.  Your provider has recommended that you have a home sleep study.  We have provided you with the equipment in our office today. Please download the app and follow the instructions. YOUR PIN NUMBER IS: [id number]. Once you have completed the test you just dispose of the equipment, the information is automatically uploaded to [place] via blue-tooth technology. If your test is positive for sleep apnea and you need a home CPAP machine you will be contacted by Dr Norris Cross office [facility]) to set this up.  Please call our office in April 2023 to schedule your follow up appointment  If you have any questions or concerns before your next appointment please send [place] a message through [place] or call our office at [phone].    TO LEAVE A MESSAGE FOR THE NURSE SELECT OPTION 2, PLEASE LEAVE A MESSAGE INCLUDING: YOUR NAME DATE OF BIRTH CALL BACK NUMBER REASON FOR CALL**this is important as we prioritize the call backs  YOU WILL RECEIVE A CALL BACK THE SAME DAY AS LONG AS YOU CALL BEFORE 4:00 PM  milAt the [facility], you and your health needs are our priority. As part of our continuing mission to provide you with exceptional heart care, we have created designated Provider Care Teams. These Care Teams include your primary Cardiologist (physician) and Advanced Practice Providers (APPs- Physician Assistants and Nurse Practitioners) who all work together to provide you with the care you need, when you need it.   You may see any of the following providers on your designated Care Team at your next follow up: Dr Arvilla Meres Dr Marca Ancona Dr Brandon Melnick, NP Robbie Lis, [place] Mikki Santee Karle Plumber, PharmD   Please be sure to bring in all your medications bottles to every appointment.

## 2021-07-28 NOTE — Progress Notes (Signed)
Advanced [facility] Failure Clinic Note   Referring Physician: Dr Excell Seltzer Primary Care: Dr Derrell Lolling [facility] Cardiologist: Dr Beverely Pace   HPI: Alec Taylor is a 75 y.o. male with history of CAD s/p CABG 2004 at [facility], HTN, hyperlipidemia, DMII, obesity s/p gastric bypass and systolic HF.   Echocardiogram 12/2017  revealed LVEF of 45 to 50%.   Admitted 04/28/18 with increased dyspnea. CTA negative for PE. ECHO performed and showed reduced EF 35-40%. He underwent LHC . Stable coronary disease and grafts. Diuresed well. Discharge weight was 177 pounds.   Here for routine f/u. Says he is feeling pretty good. Was having some dizziness and saw ENT. Thought it was inner ear issues. Had balance training.  Says it mostly happens when he gets up too quickly. No edema, orthopnea or PND. Says he gets SOB pretty easily. No change. Taking lasix 20 mg MWF. + snoring.   Echo today 07/28/21 EF 45-50% (read as 50-55%) Personally reviewed   Cardiac studies:  Cath 3/21 1. Severe native 3v CAD with patent grafts to LAD and dRCA 2. EF 45-50% Ao = 115/51 (76) LV = 107/17 RA = 6 RV = 45/6 PA =  51/20 (33) PCW = 19 (v wave 28) Fick cardiac output/index = 8.0/4.0 PVR = 1.4 WU FA sat = 99% PA sat = 69%, 74% SVC sat = 72%  Echo 3/21 . LV dilated EF 40-45% RV ok. Personally reviewed Echo 1/20 EF 40-45%   CMRI 8/14  1. Normal left ventricular size with mild concentric hypertrophy and severely decreased systolic function (LVEF = 38%) with diffuse hypokinesis.  There is pericardial late gadolinium enhancement in the basal and mid inferior walls.  2. Normal right ventricular size, thickness and borderline systolic function (LVEF = 45%). There are no regional wall motion abnormalities.  3.  Moderately dilated left atrium, mildly dilated right atrium.  4. Normal size of the aortic root, ascending aorta. Mildly dilated pulmonary artery measuring 31 mm.  5.  Mild aortic, mitral and trivial tricuspid  regurgitation.  6.  Normal pericardium.  No pericardial effusion  ECHO 04/29/2018  Left ventricle: The cavity size was normal. Wall thickness was   normal. Systolic function was moderately reduced. The estimated   ejection fraction was in the range of 35% to 40%. Diffuse   hypokinesis. The study is not technically sufficient to allow   evaluation of LV diastolic function. - Aortic valve: There was mild regurgitation. - Mitral valve: Severely calcified annulus. There was mild   regurgitation. - Left atrium: The atrium was mildly dilated. - Tricuspid valve: There was trivial regurgitation  LHC 04/30/2018  Prox RCA to Mid RCA lesion is 65% stenosed. Dist RCA-1 lesion is 85% stenosed. Dist RCA-2 lesion is 95% stenosed with 80% stenosed side branch in Post Atrio. SVG-rPDA graft was visualized by angiography and is normal in caliber and large. The graft exhibits no disease. Ost RPDA lesion is 45% stenosed. Ost RPDA to prox RPDA lesion is 90% stenosed - This limits flow retrograde to the RPAV-RPL. Ost LAD lesion is 55% stenosed. Ost 1st Diag (small caliber vessel) lesion is 70% stenosed. Prox LAD lesion is 100% stenosed. LIMA-LAD graft is large caliber, widely patent perfusing a small caliber distal LAD Prox Cx to Mid Cx stent is 25% stenosed. Dist Cx lesion is 65% stenosed. Ost 3rd Mrg to 3rd Mrg lesion is 70% stenosed. -Both limbs of the distal bifurcation. There is severe left ventricular systolic dysfunction. The left ventricular ejection fraction is  less than 25% by visual estimate. LV end diastolic pressure is moderately elevated. Significant aortic and mitral valve calcification. Calcified pericardium  Review of systems complete and found to be negative unless listed in HPI.   Past Medical History:  Diagnosis Date   CAD (coronary artery disease) 09/2007   s/p CABG approximately 2004-[facility] (Cath - 17 Sep 2007:90% prox LAD with diffuse 85% narrowing to distal segment, 80% prox CXA, RCA:  prox 90%, prox-mid 70%, mid 85%, distal 80%, D1 80%; Normal EF 65% --status post CABG--LIMA to LAD, SVG to PDA.     COPD (chronic obstructive pulmonary disease) (HCC)    Depression    Diabetes mellitus without complication (HCC)    Hypertension    Ischemic cardiomyopathy    Pleural effusion    S/P CABG x 2    CABG--LIMA to LAD, SVG to PDA.     Current Outpatient Medications  Medication Sig Dispense Refill   ALPRAZolam (XANAX) 0.25 MG tablet TAKE ONE-HALF TO ONE TABLET BY MOUTH ONE TIME DAILY AS NEEDED FOR ANXIETY 30 tablet 2   aspirin EC 81 MG tablet Take 81 mg by mouth at bedtime.      atorvastatin (LIPITOR) 20 MG tablet Take 1 tablet (20 mg total) by mouth daily. Please make an appointment for further refills 30 tablet 6   buPROPion (WELLBUTRIN XL) 150 MG 24 hr tablet Take 1 tablet (150 mg total) by mouth daily. 90 tablet 1   carvedilol (COREG) 6.25 MG tablet TAKE 1 TABLET BY MOUTH 2 TIMES DAILY WITH A MEAL 180 tablet 3   Cholecalciferol (VITAMIN D3) 5000 units CAPS Take 5,000 Units by mouth daily.      donepezil (ARICEPT) 5 MG tablet Take 1 tablet (5 mg total) by mouth daily. 90 tablet 1   DULoxetine (CYMBALTA) 60 MG capsule Take 1 capsule (60 mg total) by mouth daily. 90 capsule 0   empagliflozin (JARDIANCE) 10 MG TABS tablet Take 1 tablet (10 mg total) by mouth daily. Please make an appointment for refills 90 tablet 3   finasteride (PROSCAR) 5 MG tablet Take 5 mg by mouth daily.   6   folic acid (FOLVITE) 1 MG tablet Take 1 mg by mouth daily.  2   furosemide (LASIX) 20 MG tablet Take 1 tablet (20 mg total) by mouth every Monday, Wednesday, and Friday. 39 tablet 3   glimepiride (AMARYL) 2 MG tablet Take 2 mg by mouth every evening.  1   metFORMIN (GLUCOPHAGE-XR) 500 MG 24 hr tablet Take 500 mg by mouth 2 (two) times daily.  1   nitroGLYCERIN (NITROSTAT) 0.4 MG SL tablet Place 1 tablet (0.4 mg total) under the tongue every 5 (five) minutes as needed for chest pain. 100 tablet 3    ofloxacin (OCUFLOX) 0.3 % ophthalmic solution Place 1 drop into both eyes as needed (Before procedure eye injections). Prior to procedures.     polyvinyl alcohol (LIQUIFILM TEARS) 1.4 % ophthalmic solution 1 drop as needed.     sacubitril-valsartan (ENTRESTO) 49-51 MG Take 1 tablet by mouth 2 (two) times daily. 180 tablet 3   zolpidem (AMBIEN CR) 12.5 MG CR tablet TAKE ONE TABLET BY MOUTH ONE TIME DAILY AT BEDTIME AS NEEDED FOR SLEEP 30 tablet 2   No current facility-administered medications for this encounter.   Allergies  Allergen Reactions   Celecoxib Other (See Comments)    hyperkalemia   Losartan Other (See Comments)    hyperkalemia   Ramipril Cough  Social History   Socioeconomic History   Marital status: Legally Separated    Spouse name: Not on file   Number of children: Not on file   Years of education: Not on file   Highest education level: Not on file  Occupational History   Not on file  Tobacco Use   Smoking status: Never   Smokeless tobacco: Never  Vaping Use   Vaping Use: Never used  Substance and Sexual Activity   Alcohol use: Yes    Alcohol/week: 2.0 standard drinks    Types: 2 Cans of beer per week    Comment: occ   Drug use: Never   Sexual activity: Not on file  Other Topics Concern   Not on file  Social History Narrative   Not on file   Social Determinants of Health   Financial Resource Strain: Not on file  Food Insecurity: Not on file  Transportation Needs: Not on file  Physical Activity: Not on file  Stress: Not on file  Social Connections: Not on file  Intimate Partner Violence: Not on file    Family History  Problem Relation Age of Onset   Hypertension Father    Vitals:   07/28/21 1031  BP: (!) 100/57  Pulse: 64  SpO2: 95%  Weight: 88.5 kg (195 lb 3.2 oz)     Wt Readings from Last 3 Encounters:  07/28/21 88.5 kg (195 lb 3.2 oz)  06/19/21 84.8 kg (187 lb)  05/12/21 86.6 kg (191 lb)     PHYSICAL EXAM: General:  Elderly   No resp difficulty HEENT: normal Neck: supple. no obvious JVD. Carotids 2+ bilat; no bruits. No lymphadenopathy or thryomegaly appreciated. Cor: PMI nondisplaced. Mildly irregular  No rubs, gallops or murmurs. Lungs: clear Abdomen: soft, nontender, nondistended. No hepatosplenomegaly. No bruits or masses. Good bowel sounds. Extremities: no cyanosis, clubbing, rash, edema Neuro: alert & orientedx3, cranial nerves grossly intact. moves all 4 extremities w/o difficulty. Affect pleasant  ECG: SR 60 with frequent PACs nonspecific ST-Ts    ASSESSMENT & PLAN:  1. Chronic Systolic Heart Failure due to ICM - EF 1/19 45-50% - ECHO 5/19 EF 35-40%. ?Viral versus PVC on top of iCM - cMRI 8/19 EF 38% pericardial late gadolinium enhancement in the basal and mid inferior walls. Suspicious for recent myocarditis. - Echo 3/21 EF 40-45% - Cath 3/21: Underwent R/L cath with stable anatomy and no targets for PCI. -  Echo today 07/28/21 EF 45-50% (read as 50-55%) Personally reviewed - Stable NYHA III.  - Based on exam and history, I initially felt volume status ok or even a bit low but ReDS 40% - Will double lasix for 2 doses and see if he feels any better. O/w continue lasix 20mg  MWF - Continue carvedilol to 9.375 mg BID - Continue Entresto 49/51 mg BID.  - Continue Jardiance 10  - Continue spironolactone 12.5 mg daily.  - Check labs  2. CAD S/P CABG  - LHC 04/30/2018 severe multivessel disease and patent LIMA-LAD SVG-PDA - No s/s angina - Cath 3/21 stable anatomy. No PCI targets - Continue statin, BB, and ASA.  3. Anemia - secondary to gastric bypass surgery - Iron stores low 05/28/18.  - He continues to receive feraheme - he has recurrent iron deficiency. Followed by PCP and GI - recheck labs today  4. DM2 - Follows with Dr. 05/30/18 (Endo) - Continue Jardiance  5. PVCs - No palpitations currently.   6. Snoring with with frequent PACs and daytime  fatigue - check home sleep  study   Arvilla Meres, MD  10:56 AM

## 2021-08-03 ENCOUNTER — Telehealth (HOSPITAL_COMMUNITY): Payer: Self-pay | Admitting: Cardiology

## 2021-08-03 NOTE — Telephone Encounter (Signed)
Patient left Vm on triage line with a request to hhave someone call him back to review results while wife was present  Returned call no answer LMOm

## 2021-08-10 ENCOUNTER — Other Ambulatory Visit: Payer: Medicare Other

## 2021-08-10 ENCOUNTER — Other Ambulatory Visit: Payer: Self-pay

## 2021-08-10 ENCOUNTER — Other Ambulatory Visit: Payer: Self-pay | Admitting: *Deleted

## 2021-08-10 ENCOUNTER — Other Ambulatory Visit (HOSPITAL_COMMUNITY): Payer: Self-pay | Admitting: *Deleted

## 2021-08-10 ENCOUNTER — Telehealth: Payer: Self-pay | Admitting: *Deleted

## 2021-08-10 DIAGNOSIS — I5022 Chronic systolic (congestive) heart failure: Secondary | ICD-10-CM

## 2021-08-10 NOTE — Telephone Encounter (Signed)
Spoke with pt. He was hoping to come in here for Dr Bensimhon's labs (cardiology). Explained to pt that we are not able to draw cardiology's labs but he can [place] the [facility] service center upstairs in our building. Contacted Dr Bensimhon's CMA and she placed orders. Verified with [facility] that they could see lab order and directed pt to [address]. Pt appreciative of help.

## 2021-08-10 NOTE — Telephone Encounter (Signed)
Pt scheduled for lab appointment today at 9:15. No future orders in Epic and I am unclear what labs he is needing. PCP not in office today. Left message for pt to return my call. May need to reschedule until I can get verification from Dr Carmelia Roller.

## 2021-08-11 LAB — BASIC METABOLIC PANEL
BUN/Creatinine Ratio: 31 — ABNORMAL HIGH (ref 10–24)
BUN: 39 mg/dL — ABNORMAL HIGH (ref 8–27)
CO2: 22 mmol/L (ref 20–29)
Calcium: 8.9 mg/dL (ref 8.6–10.2)
Chloride: 104 mmol/L (ref 96–106)
Creatinine, Ser: 1.25 mg/dL (ref 0.76–1.27)
Glucose: 109 mg/dL — ABNORMAL HIGH (ref 65–99)
Potassium: 5.5 mmol/L — ABNORMAL HIGH (ref 3.5–5.2)
Sodium: 141 mmol/L (ref 134–144)
eGFR: 61 mL/min/{1.73_m2} (ref >59)

## 2021-08-18 ENCOUNTER — Encounter (INDEPENDENT_AMBULATORY_CARE_PROVIDER_SITE_OTHER): Payer: Medicare Other | Admitting: Cardiology

## 2021-08-18 DIAGNOSIS — G4733 Obstructive sleep apnea (adult) (pediatric): Secondary | ICD-10-CM | POA: Diagnosis not present

## 2021-08-20 NOTE — Progress Notes (Signed)
Advanced Heart Failure Clinic Note    Primary Care: Sharlene Dory, DO Primary [facility] Cardiologist: Dr Beverely Pace  HF Cardiologist: Dr. [name]  HPI: Alec Taylor is a 75 y.o. male with history of CAD s/p CABG 2004 at [facility], HTN, hyperlipidemia, DMII, obesity s/p gastric bypass and systolic HF.   Echocardiogram 12/2017  revealed LVEF of 45 to 50%.   Admitted 04/28/18 with increased dyspnea. CTA negative for PE. ECHO performed and showed reduced EF 35-40%. He underwent LHC . Stable coronary disease and grafts. Diuresed well. Discharge weight was 177 pounds.   Follow up with Dr. [name] 07/28/21, stable NYHA II, volume mildly elevated. Instructed to double lasix x 2 days. Sleep study arranged.  Echo 07/28/21 EF 45-50% (read as 50-55%).  Today he returns for an acute visit with complaints of dizziness and low BP. Here with his wife . "Feels crappy." Took extra lasix for 2 days and did not feel well. Wife says fatigue has gotten worse over past 4 months. Saw ENT for dizziness, had balance training which helped. Breathing about the same, SOB walking further distances on flat ground. Denies CP, edema, or PND/Orthopnea. Appetite ok. No fever or chills. Weight at home 187 pounds. Taking all medications. BP at home 120-130s/50s.  Cardiac studies:  - Cath 3/21 1. Severe native 3v CAD with patent grafts to LAD and dRCA 2. EF 45-50% Ao = 115/51 (76) LV = 107/17 RA = 6 RV = 45/6 PA =  51/20 (33) PCW = 19 (v wave 28) Fick cardiac output/index = 8.0/4.0 PVR = 1.4 WU FA sat = 99% PA sat = 69%, 74% SVC sat = 72%  - Echo (8/22): EF 45-50% (read as 50-55%). - Echo (3/21):[name] LV dilated EF 40-45% RV ok. Personally reviewed - Echo (1/20): EF 40-45%  cMRI (8/14):  1. Normal left ventricular size with mild concentric hypertrophy and severely decreased systolic function (LVEF = 38%) with diffuse hypokinesis.  There is pericardial late gadolinium enhancement in the basal and mid inferior  walls.  2. Normal right ventricular size, thickness and borderline systolic function (LVEF = 45%). There are no regional wall motion abnormalities.  3.  Moderately dilated left atrium, mildly dilated right atrium.  4. Normal size of the aortic root, ascending aorta. Mildly dilated pulmonary artery measuring 31 mm.  5.  Mild aortic, mitral and trivial tricuspid regurgitation.  6.  Normal pericardium.  No pericardial effusion  - ECHO 04/29/2018  EF 35% to 40%. Diffuse hypokinesis, Mitral valve: Severely calcified annulus. There was mild regurgitation.  - LHC 04/30/2018  Prox RCA to Mid RCA lesion is 65% stenosed. Dist RCA-1 lesion is 85% stenosed. Dist RCA-2 lesion is 95% stenosed with 80% stenosed side branch in Post Atrio. SVG-rPDA graft was visualized by angiography and is normal in caliber and large. The graft exhibits no disease. Ost RPDA lesion is 45% stenosed. Ost RPDA to prox RPDA lesion is 90% stenosed - This limits flow retrograde to the RPAV-RPL. Ost LAD lesion is 55% stenosed. Ost 1st Diag (small caliber vessel) lesion is 70% stenosed. Prox LAD lesion is 100% stenosed. LIMA-LAD graft is large caliber, widely patent perfusing a small caliber distal LAD Prox Cx to Mid Cx stent is 25% stenosed. Dist Cx lesion is 65% stenosed. Ost 3rd Mrg to 3rd Mrg lesion is 70% stenosed. -Both limbs of the distal bifurcation. There is severe left ventricular systolic dysfunction. The left ventricular ejection fraction is less than 25% by visual estimate. LV end diastolic pressure  is moderately elevated. Significant aortic and mitral valve calcification. Calcified pericardium  Review of systems complete and found to be negative unless listed in HPI.   Past Medical History:  Diagnosis Date   CAD (coronary artery disease) 09/2007   s/p CABG approximately 2004-[facility] (Cath - 17 Sep 2007:90% prox LAD with diffuse 85% narrowing to distal segment, 80% prox CXA, RCA: prox 90%, prox-mid 70%, mid 85%,  distal 80%, D1 80%; Normal EF 65% --status post CABG--LIMA to LAD, SVG to PDA.     COPD (chronic obstructive pulmonary disease) (HCC)    Depression    Diabetes mellitus without complication (HCC)    Hypertension    Ischemic cardiomyopathy    Pleural effusion    S/P CABG x 2    CABG--LIMA to LAD, SVG to PDA.     Current Outpatient Medications  Medication Sig Dispense Refill   ALPRAZolam (XANAX) 0.25 MG tablet TAKE ONE-HALF TO ONE TABLET BY MOUTH ONE TIME DAILY AS NEEDED FOR ANXIETY 30 tablet 2   aspirin EC 81 MG tablet Take 81 mg by mouth at bedtime.      atorvastatin (LIPITOR) 20 MG tablet Take 1 tablet (20 mg total) by mouth daily. Please make an appointment for further refills 30 tablet 6   buPROPion (WELLBUTRIN XL) 150 MG 24 hr tablet Take 1 tablet (150 mg total) by mouth daily. 90 tablet 1   carvedilol (COREG) 6.25 MG tablet 6.25 mg. Patient is taking 1 tablet daily.     Cholecalciferol (VITAMIN D3) 5000 units CAPS Take 5,000 Units by mouth daily.      donepezil (ARICEPT) 5 MG tablet Take 1 tablet (5 mg total) by mouth daily. 90 tablet 1   DULoxetine (CYMBALTA) 60 MG capsule Take 1 capsule (60 mg total) by mouth daily. 90 capsule 0   empagliflozin (JARDIANCE) 10 MG TABS tablet Take 1 tablet (10 mg total) by mouth daily. Please make an appointment for refills 90 tablet 3   finasteride (PROSCAR) 5 MG tablet Take 5 mg by mouth daily.   6   folic acid (FOLVITE) 1 MG tablet Take 1 mg by mouth daily.  2   furosemide (LASIX) 20 MG tablet Take 1 tablet (20 mg total) by mouth every Monday, Wednesday, and Friday. 39 tablet 3   glimepiride (AMARYL) 2 MG tablet Take 2 mg by mouth every evening.  1   metFORMIN (GLUCOPHAGE-XR) 500 MG 24 hr tablet Take 500 mg by mouth 2 (two) times daily.  1   nitroGLYCERIN (NITROSTAT) 0.4 MG SL tablet Place 1 tablet (0.4 mg total) under the tongue every 5 (five) minutes as needed for chest pain. 100 tablet 3   ofloxacin (OCUFLOX) 0.3 % ophthalmic solution Place 1  drop into both eyes as needed (Before procedure eye injections). Prior to procedures.     polyvinyl alcohol (LIQUIFILM TEARS) 1.4 % ophthalmic solution 1 drop as needed.     sacubitril-valsartan (ENTRESTO) 49-51 MG Take 1 tablet by mouth 2 (two) times daily. 180 tablet 3   zolpidem (AMBIEN CR) 12.5 MG CR tablet TAKE ONE TABLET BY MOUTH ONE TIME DAILY AT BEDTIME AS NEEDED FOR SLEEP 30 tablet 2   No current facility-administered medications for this encounter.   Allergies  Allergen Reactions   Celecoxib Other (See Comments)    hyperkalemia   Losartan Other (See Comments)    hyperkalemia   Ramipril Cough        Social History   Socioeconomic History   Marital status: Legally  Separated    Spouse name: Not on file   Number of children: Not on file   Years of education: Not on file   Highest education level: Not on file  Occupational History   Not on file  Tobacco Use   Smoking status: Never   Smokeless tobacco: Never  Vaping Use   Vaping Use: Never used  Substance and Sexual Activity   Alcohol use: Yes    Alcohol/week: 2.0 standard drinks    Types: 2 Cans of beer per week    Comment: occ   Drug use: Never   Sexual activity: Not on file  Other Topics Concern   Not on file  Social History Narrative   Not on file   Social Determinants of Health   Financial Resource Strain: Not on file  Food Insecurity: Not on file  Transportation Needs: Not on file  Physical Activity: Not on file  Stress: Not on file  Social Connections: Not on file  Intimate Partner Violence: Not on file    Family History  Problem Relation Age of Onset   Hypertension Father    BP 124/70   Pulse 98   Wt 87.7 kg (193 lb 6.4 oz)   SpO2 90%   BMI 27.75 kg/m   Wt Readings from Last 3 Encounters:  08/21/21 87.7 kg (193 lb 6.4 oz)  07/28/21 88.5 kg (195 lb 3.2 oz)  06/19/21 84.8 kg (187 lb)     PHYSICAL EXAM: General:  NAD. No resp difficulty HEENT: Normal Neck: Supple. No JVD. Carotids 2+  bilat; no bruits. No lymphadenopathy or thryomegaly appreciated. Cor: PMI nondisplaced. Regular rate & rhythm. No rubs, gallops or murmurs. Lungs: Clear Abdomen: Soft, nontender, nondistended. No hepatosplenomegaly. No bruits or masses. Good bowel sounds. Extremities: No cyanosis, clubbing, rash, edema Neuro: Alert & oriented x 3, cranial nerves grossly intact. Moves all 4 extremities w/o difficulty. Affect pleasant.  ReDs: 39%   ASSESSMENT & PLAN:  1. Chronic Systolic Heart Failure due to ICM - EF 1/19 45-50% - Echo 5/19 EF 35-40%. ?Viral versus PVC on top of iCM - cMRI 8/19 EF 38% pericardial late gadolinium enhancement in the basal and mid inferior walls. Suspicious for recent myocarditis. - Echo 3/21 EF 40-45% - Cath 3/21: Underwent R/L cath with stable anatomy and no targets for PCI. - Echo 07/28/21 EF 45-50% (read as 50-55%)  - Stable NYHA III, volume looks ok on exam but ReDs 39%. - With dizziness, decrease Entresto to 24/26 mg bid. - Increase lasix to 40 mg bid x 2 days then 20 mg daily (previously on 20 mg MWF). - Continue carvedilol 6.25 mg daily. - Continue Jardiance 10 mg daily. - Off spiro with hyperkalemia. - I have asked him to check his BP daily and bring log to next appt. - BMET and BNP today; repeat BMET in 10 days.  2. CAD S/P CABG  - LHC 04/30/2018 severe multivessel disease and patent LIMA-LAD SVG-PDA - Cath 3/21 stable anatomy. No PCI targets. - No S/S angina. - Continue statin, BB, and ASA.  3. Anemia - Secondary to gastric bypass surgery - Iron stores low 05/28/18.  - He continues to receive feraheme. - He has recurrent iron deficiency. Followed by PCP and GI. - Labs 8/22 ok.  4. DM2 - Follows with Dr. Ocie Cornfield (Endo). - Continue Jardiance 10 mg daily.  5. PVCs - No palpitations currently.   6. Snoring with with frequent PACs and daytime fatigue - Home sleep study results  pending.  Follow up with APP in 3 weeks. Consider stopping Entresto and  starting losartan if dizziness persists after above med changes.  Jacklynn Ganong, FNP  3:49 PM

## 2021-08-21 ENCOUNTER — Ambulatory Visit (HOSPITAL_COMMUNITY)
Admission: RE | Admit: 2021-08-21 | Discharge: 2021-08-21 | Disposition: A | Discharge status: Home / Self Care | Payer: Medicare Other | Source: Ambulatory Visit | Attending: Family Medicine | Admitting: Family Medicine

## 2021-08-21 ENCOUNTER — Encounter (HOSPITAL_COMMUNITY): Payer: Self-pay

## 2021-08-21 ENCOUNTER — Other Ambulatory Visit: Payer: Self-pay

## 2021-08-21 ENCOUNTER — Other Ambulatory Visit (HOSPITAL_COMMUNITY): Payer: Self-pay | Admitting: Family Medicine

## 2021-08-21 VITALS — BP 124/70 | HR 98 | Wt 193.4 lb

## 2021-08-21 DIAGNOSIS — Z8249 Family history of ischemic heart disease and other diseases of the circulatory system: Secondary | ICD-10-CM | POA: Diagnosis not present

## 2021-08-21 DIAGNOSIS — Z79899 Other long term (current) drug therapy: Secondary | ICD-10-CM | POA: Diagnosis not present

## 2021-08-21 DIAGNOSIS — R0683 Snoring: Secondary | ICD-10-CM | POA: Insufficient documentation

## 2021-08-21 DIAGNOSIS — Z951 Presence of aortocoronary bypass graft: Secondary | ICD-10-CM | POA: Insufficient documentation

## 2021-08-21 DIAGNOSIS — I110 Hypertensive heart disease with heart failure: Secondary | ICD-10-CM | POA: Insufficient documentation

## 2021-08-21 DIAGNOSIS — Z7984 Long term (current) use of oral hypoglycemic drugs: Secondary | ICD-10-CM | POA: Diagnosis not present

## 2021-08-21 DIAGNOSIS — I5021 Acute systolic (congestive) heart failure: Secondary | ICD-10-CM

## 2021-08-21 DIAGNOSIS — E785 Hyperlipidemia, unspecified: Secondary | ICD-10-CM | POA: Diagnosis not present

## 2021-08-21 DIAGNOSIS — G4719 Other hypersomnia: Secondary | ICD-10-CM

## 2021-08-21 DIAGNOSIS — Z9884 Bariatric surgery status: Secondary | ICD-10-CM | POA: Insufficient documentation

## 2021-08-21 DIAGNOSIS — Z888 Allergy status to other drugs, medicaments and biological substances status: Secondary | ICD-10-CM | POA: Insufficient documentation

## 2021-08-21 DIAGNOSIS — I5022 Chronic systolic (congestive) heart failure: Secondary | ICD-10-CM | POA: Insufficient documentation

## 2021-08-21 DIAGNOSIS — D649 Anemia, unspecified: Secondary | ICD-10-CM | POA: Insufficient documentation

## 2021-08-21 DIAGNOSIS — E119 Type 2 diabetes mellitus without complications: Secondary | ICD-10-CM | POA: Diagnosis not present

## 2021-08-21 DIAGNOSIS — Z7982 Long term (current) use of aspirin: Secondary | ICD-10-CM | POA: Diagnosis not present

## 2021-08-21 DIAGNOSIS — D508 Other iron deficiency anemias: Secondary | ICD-10-CM

## 2021-08-21 DIAGNOSIS — I2510 Atherosclerotic heart disease of native coronary artery without angina pectoris: Secondary | ICD-10-CM | POA: Insufficient documentation

## 2021-08-21 DIAGNOSIS — I255 Ischemic cardiomyopathy: Secondary | ICD-10-CM | POA: Diagnosis not present

## 2021-08-21 DIAGNOSIS — I493 Ventricular premature depolarization: Secondary | ICD-10-CM | POA: Diagnosis not present

## 2021-08-21 LAB — BASIC METABOLIC PANEL
Anion gap: 8 (ref 5–15)
BUN: 40 mg/dL — ABNORMAL HIGH (ref 8–23)
CO2: 25 mmol/L (ref 22–32)
Calcium: 8.9 mg/dL (ref 8.9–10.3)
Chloride: 105 mmol/L (ref 98–111)
Creatinine, Ser: 1.12 mg/dL (ref 0.61–1.24)
GFR, Estimated: >60 mL/min (ref >60)
Glucose, Bld: 117 mg/dL — ABNORMAL HIGH (ref 70–99)
Potassium: 5.9 mmol/L — ABNORMAL HIGH (ref 3.5–5.1)
Sodium: 138 mmol/L (ref 135–145)

## 2021-08-21 LAB — BRAIN NATRIURETIC PEPTIDE: B Natriuretic Peptide: 547.2 pg/mL — ABNORMAL HIGH (ref 0.0–100.0)

## 2021-08-21 MED ORDER — LOKELMA 10 G PO PACK: 10.0000 g | PACK | Freq: Once | ORAL | 0 refills | Status: AC

## 2021-08-21 MED ORDER — FUROSEMIDE 20 MG PO TABS: 20.0000 mg | ORAL_TABLET | ORAL | 3 refills | Status: DC

## 2021-08-21 MED ORDER — ENTRESTO 24-26 MG PO TABS: 1.0000 | ORAL_TABLET | Freq: Two times a day (BID) | ORAL | 4 refills | Status: DC

## 2021-08-21 NOTE — Patient Instructions (Addendum)
Labs were done today, if any labs are abnormal the clinic will call you  DECREASE Entresto to 24/26 mg 1 tablet twice daily  INCREASE Lasix to 40 mg two times daily for 2 days ONLY, then 20 mg daily  CHECK Blood pressure daily and record results and bring log with you to your next appointment  Your physician recommends that you schedule a follow-up appointment in: 3 weeks  At the [facility], you and your health needs are our priority. As part of our continuing mission to provide you with exceptional heart care, we have created designated Provider Care Teams. These Care Teams include your primary Cardiologist (physician) and Advanced Practice Providers (APPs- Physician Assistants and Nurse Practitioners) who all work together to provide you with the care you need, when you need it.   You may see any of the following providers on your designated Care Team at your next follow up: Dr Arvilla Meres Dr Marca Ancona Dr Brandon Melnick, NP Robbie Lis, [place] Mikki Santee Karle Plumber, PharmD   Please be sure to bring in all your medications bottles to every appointment.    If you have any questions or concerns before your next appointment please send [place] a message through [place] or call our office at [phone].    TO LEAVE A MESSAGE FOR THE NURSE SELECT OPTION 2, PLEASE LEAVE A MESSAGE INCLUDING: YOUR NAME DATE OF BIRTH CALL BACK NUMBER REASON FOR CALL**this is important as we prioritize the call backs  YOU WILL RECEIVE A CALL BACK THE SAME DAY AS LONG AS YOU CALL BEFORE 4:00 PM

## 2021-08-21 NOTE — Progress Notes (Signed)
ReDS Vest / Clip - 08/21/21 1500       ReDS Vest / Clip   Station Marker C    Ruler Value 29    ReDS Value Range Moderate volume overload    ReDS Actual Value 39

## 2021-08-21 NOTE — Progress Notes (Signed)
MyChart message sent to patient about hyperkalemia on today's labs and further medication instructions. Entresto discontinued from medication list and Lokelma Rx sent to pharmacy.   Prince Rome, FNP-BC

## 2021-08-23 ENCOUNTER — Ambulatory Visit: Payer: Medicare Other

## 2021-08-23 DIAGNOSIS — G4719 Other hypersomnia: Secondary | ICD-10-CM

## 2021-08-23 DIAGNOSIS — I5022 Chronic systolic (congestive) heart failure: Secondary | ICD-10-CM

## 2021-08-23 NOTE — Procedures (Signed)
   Sleep Study Report  Patient Information Study Date: 08/18/21 Patient Name: Alec Taylor Patient ID: [id number] Birth Date: Feb 19, 2046 Age: 75 Gender: Male Referring Physician:Daniel Bensimhon, MD  TEST DESCRIPTION: Home sleep apnea testing was completed using the WatchPat, a Type 1 device, utilizing peripheral arterial tonometry (PAT), chest movement, actigraphy, pulse oximetry, pulse rate, body position and snore. AHI was calculated with apnea and hypopnea using valid sleep time as the denominator. RDI includes apneas, hypopneas, and RERAs. The data acquired and the scoring of sleep and all associated events were performed in accordance with the recommended standards and specifications as outlined in the AASM Manual for the Scoring of Sleep and Associated Events 2.2.0 (2015).  FINDINGS: 1. Mild Obstructive Sleep Apnea with AHI 11.5/hr. 2. No Central Sleep Apnea with pAHIc 0/hr. 3. Oxygen desaturations as low as 80%. 4. Mild snoring was present. O2 sats were < 88% for 24.7 min. 5. Total sleep time was 6 hrs and 32 min. 6. 39.4% of total sleep time was spent in REM sleep. 7. Normal sleep onset latency at 17 min. 8. Prolonged REM sleep onset latency at 143 min. 9. Total awakenings were 11.  DIAGNOSIS: Mild Obstructive Sleep Apnea (G47.33) Nocturnal Hypoxemia  RECOMMENDATIONS: 1. Clinical correlation of these findings is necessary. The decision to treat obstructive sleep apnea (OSA) is usually based on the presence of apnea symptoms or the presence of associated medical conditions such as Hypertension, Congestive Heart Failure, Atrial Fibrillation or Obesity. The most common symptoms of OSA are snoring, gasping for breath while sleeping, daytime sleepiness and fatigue.  2. Initiating apnea therapy is recommended given the presence of symptoms and/or associated conditions. Recommend proceeding with one of the following:   a. Auto-CPAP therapy with a pressure range of 5-20cm  H2O.   b. An oral appliance (OA) that can be obtained from certain dentists with expertise in sleep medicine. These are primarily of use in non-obese patients with mild and moderate disease.   c. An ENT consultation which may be useful to look for specific causes of obstruction and possible treatment options.   d. If patient is intolerant to PAP therapy, consider referral to ENT for evaluation for hypoglossal nerve stimulator.  3. Close follow-up is necessary to ensure success with CPAP or oral appliance therapy for maximum benefit .  4. A follow-up oximetry study on CPAP is recommended to assess the adequacy of therapy and determine the need for supplemental oxygen or the potential need for Bi-level therapy. An arterial blood gas to determine the adequacy of baseline ventilation and oxygenation should also be considered.  5. Healthy sleep recommendations include: adequate nightly sleep (normal 7-9 hrs/night), avoidance of caffeine after noon and alcohol near bedtime, and maintaining a sleep environment that is cool, dark and quiet.  6. Weight loss for overweight patients is recommended. Even modest amounts of weight loss can significantly improve the severity of sleep apnea.  7. Snoring recommendations include: weight loss where appropriate, side sleeping, and avoidance of alcohol before bed.  8. Operation of motor vehicle should be avoided when sleepy.  Signature: Electronically Signed: 08/23/21 Armanda Magic, MD; [facility]; Diplomat, American Board of Sleep Medicine Report prepared by: Armanda Magic

## 2021-08-25 ENCOUNTER — Other Ambulatory Visit (HOSPITAL_COMMUNITY): Payer: Self-pay | Admitting: Family Medicine

## 2021-08-25 ENCOUNTER — Other Ambulatory Visit (HOSPITAL_COMMUNITY): Payer: Self-pay

## 2021-08-25 DIAGNOSIS — I5022 Chronic systolic (congestive) heart failure: Secondary | ICD-10-CM

## 2021-08-26 LAB — BASIC METABOLIC PANEL
BUN/Creatinine Ratio: 36 — ABNORMAL HIGH (ref 10–24)
BUN: 52 mg/dL — ABNORMAL HIGH (ref 8–27)
CO2: 25 mmol/L (ref 20–29)
Calcium: 9.4 mg/dL (ref 8.6–10.2)
Chloride: 100 mmol/L (ref 96–106)
Creatinine, Ser: 1.43 mg/dL — ABNORMAL HIGH (ref 0.76–1.27)
Glucose: 156 mg/dL — ABNORMAL HIGH (ref 65–99)
Potassium: 5.6 mmol/L — ABNORMAL HIGH (ref 3.5–5.2)
Sodium: 142 mmol/L (ref 134–144)
eGFR: 52 mL/min/{1.73_m2} — ABNORMAL LOW (ref >59)

## 2021-08-28 ENCOUNTER — Telehealth (HOSPITAL_COMMUNITY): Payer: Self-pay

## 2021-08-28 DIAGNOSIS — I5022 Chronic systolic (congestive) heart failure: Secondary | ICD-10-CM

## 2021-08-28 NOTE — Telephone Encounter (Signed)
Patient advised and verbalized understanding,,lab orders entered, patient confirms that he has not been taking an potassium supplements or spironolactone. Patient will have repeat blood work done at [facility].   Orders Placed This Encounter  Procedures   Basic metabolic panel    Standing Status:   Future    Number of Occurrences:   1    Standing Expiration Date:   08/28/2022    Order Specific Question:   Release to patient    Answer:   Immediate

## 2021-08-28 NOTE — Telephone Encounter (Signed)
-----   Message from Jacklynn Ganong, [place] sent at 08/28/2021  9:14 AM EDT ----- Potassium better but still elevated. Please make sure he did stop his spiro and KCl suppl. Needs repeat BMET tomorrow.

## 2021-08-30 ENCOUNTER — Telehealth: Payer: Self-pay | Admitting: *Deleted

## 2021-08-30 DIAGNOSIS — G4733 Obstructive sleep apnea (adult) (pediatric): Secondary | ICD-10-CM

## 2021-08-30 LAB — BASIC METABOLIC PANEL
BUN/Creatinine Ratio: 31 — ABNORMAL HIGH (ref 10–24)
BUN: 33 mg/dL — ABNORMAL HIGH (ref 8–27)
CO2: 23 mmol/L (ref 20–29)
Calcium: 9.4 mg/dL (ref 8.6–10.2)
Chloride: 102 mmol/L (ref 96–106)
Creatinine, Ser: 1.05 mg/dL (ref 0.76–1.27)
Glucose: 185 mg/dL — ABNORMAL HIGH (ref 65–99)
Potassium: 5.2 mmol/L (ref 3.5–5.2)
Sodium: 142 mmol/L (ref 134–144)
eGFR: 75 mL/min/{1.73_m2} (ref >59)

## 2021-08-30 NOTE — Telephone Encounter (Signed)
-----   Message from Quintella Reichert, MD sent at 08/23/2021  7:08 PM EDT ----- Please let patient know that they have sleep apnea and recommend treating with CPAP.  Please order an auto CPAP from 4-15cm H2O with heated humidity and mask of choice.  Order overnight pulse ox on CPAP.  Followup with me in 6 weeks.

## 2021-08-30 NOTE — Telephone Encounter (Signed)
The patient has been notified of the result and verbalized understanding.  All questions (if any) were answered. Alec Taylor, CMA 08/30/2021 6:01 PM  Upon patient request DME selection is [facility] Patient understands he will be contacted by [facility] to set up his cpap. Patient understands to call if [facility] does not contact him with new setup in a timely manner. Patient understands they will be called once confirmation has been received from [facility] that they have received their new machine to schedule 10 week follow up appointment.   [facility] notified of new cpap order  Please add to airview Patient was grateful for the call and thanked me

## 2021-08-31 NOTE — Telephone Encounter (Signed)
Wife says her husband had a cpap before and would not wear it and she says he is not gong to wear this one. She has asked for a virtual appointment with dr Mayford Knife it is scheduled for 11/07/21 at 9:40.

## 2021-09-11 ENCOUNTER — Other Ambulatory Visit: Payer: Self-pay

## 2021-09-11 ENCOUNTER — Ambulatory Visit (HOSPITAL_COMMUNITY)
Admission: RE | Admit: 2021-09-11 | Discharge: 2021-09-11 | Disposition: A | Discharge status: Home / Self Care | Payer: Medicare Other | Source: Ambulatory Visit | Attending: Adult Health | Admitting: Adult Health

## 2021-09-11 ENCOUNTER — Encounter (HOSPITAL_COMMUNITY): Payer: Self-pay

## 2021-09-11 VITALS — BP 138/74 | HR 93 | Wt 189.2 lb

## 2021-09-11 DIAGNOSIS — Z8249 Family history of ischemic heart disease and other diseases of the circulatory system: Secondary | ICD-10-CM | POA: Diagnosis not present

## 2021-09-11 DIAGNOSIS — Z8774 Personal history of (corrected) congenital malformations of heart and circulatory system: Secondary | ICD-10-CM | POA: Insufficient documentation

## 2021-09-11 DIAGNOSIS — I2510 Atherosclerotic heart disease of native coronary artery without angina pectoris: Secondary | ICD-10-CM | POA: Diagnosis not present

## 2021-09-11 DIAGNOSIS — R5383 Other fatigue: Secondary | ICD-10-CM | POA: Diagnosis not present

## 2021-09-11 DIAGNOSIS — I110 Hypertensive heart disease with heart failure: Secondary | ICD-10-CM | POA: Diagnosis not present

## 2021-09-11 DIAGNOSIS — E669 Obesity, unspecified: Secondary | ICD-10-CM | POA: Insufficient documentation

## 2021-09-11 DIAGNOSIS — Z7984 Long term (current) use of oral hypoglycemic drugs: Secondary | ICD-10-CM | POA: Insufficient documentation

## 2021-09-11 DIAGNOSIS — E119 Type 2 diabetes mellitus without complications: Secondary | ICD-10-CM | POA: Diagnosis not present

## 2021-09-11 DIAGNOSIS — Z79899 Other long term (current) drug therapy: Secondary | ICD-10-CM | POA: Diagnosis not present

## 2021-09-11 DIAGNOSIS — Z09 Encounter for follow-up examination after completed treatment for conditions other than malignant neoplasm: Secondary | ICD-10-CM | POA: Diagnosis not present

## 2021-09-11 DIAGNOSIS — Z7982 Long term (current) use of aspirin: Secondary | ICD-10-CM | POA: Insufficient documentation

## 2021-09-11 DIAGNOSIS — I5022 Chronic systolic (congestive) heart failure: Secondary | ICD-10-CM | POA: Diagnosis not present

## 2021-09-11 DIAGNOSIS — R0602 Shortness of breath: Secondary | ICD-10-CM

## 2021-09-11 DIAGNOSIS — Z9884 Bariatric surgery status: Secondary | ICD-10-CM | POA: Insufficient documentation

## 2021-09-11 DIAGNOSIS — D649 Anemia, unspecified: Secondary | ICD-10-CM | POA: Insufficient documentation

## 2021-09-11 DIAGNOSIS — I255 Ischemic cardiomyopathy: Secondary | ICD-10-CM

## 2021-09-11 DIAGNOSIS — R600 Localized edema: Secondary | ICD-10-CM | POA: Diagnosis not present

## 2021-09-11 DIAGNOSIS — Z7901 Long term (current) use of anticoagulants: Secondary | ICD-10-CM | POA: Diagnosis not present

## 2021-09-11 DIAGNOSIS — Z951 Presence of aortocoronary bypass graft: Secondary | ICD-10-CM | POA: Insufficient documentation

## 2021-09-11 DIAGNOSIS — E785 Hyperlipidemia, unspecified: Secondary | ICD-10-CM | POA: Insufficient documentation

## 2021-09-11 LAB — BASIC METABOLIC PANEL
Anion gap: 8 (ref 5–15)
BUN: 37 mg/dL — ABNORMAL HIGH (ref 8–23)
CO2: 30 mmol/L (ref 22–32)
Calcium: 8.9 mg/dL (ref 8.9–10.3)
Chloride: 99 mmol/L (ref 98–111)
Creatinine, Ser: 1.17 mg/dL (ref 0.61–1.24)
GFR, Estimated: >60 mL/min (ref >60)
Glucose, Bld: 179 mg/dL — ABNORMAL HIGH (ref 70–99)
Potassium: 5.4 mmol/L — ABNORMAL HIGH (ref 3.5–5.1)
Sodium: 137 mmol/L (ref 135–145)

## 2021-09-11 MED ORDER — FUROSEMIDE 20 MG PO TABS: 40.0000 mg | ORAL_TABLET | Freq: Every day | ORAL | 4 refills | Status: DC

## 2021-09-11 NOTE — Progress Notes (Signed)
ReDS Vest / Clip - 09/11/21 1500       ReDS Vest / Clip   Station Marker C    Ruler Value 34    ReDS Value Range Moderate volume overload    ReDS Actual Value 40

## 2021-09-11 NOTE — Patient Instructions (Signed)
EKG was done today  Labs were done today, if any labs are abnormal the clinic will call you  Your physician recommends that you return for lab work in: 7 days  Your physician recommends that you schedule a follow-up appointment in: 8-10 weeks  INCREASE Lasix to 40 mg 2 tablets daily  You have been ordered for a CPX test instructions were given to you  At the [facility], you and your health needs are our priority. As part of our continuing mission to provide you with exceptional heart care, we have created designated Provider Care Teams. These Care Teams include your primary Cardiologist (physician) and Advanced Practice Providers (APPs- Physician Assistants and Nurse Practitioners) who all work together to provide you with the care you need, when you need it.   You may see any of the following providers on your designated Care Team at your next follow up: Dr Arvilla Meres Dr Marca Ancona Dr Brandon Melnick, NP Robbie Lis, [place] Mikki Santee Karle Plumber, PharmD   Please be sure to bring in all your medications bottles to every appointment.   If you have any questions or concerns before your next appointment please send [place] a message through [place] or call our office at [phone].    TO LEAVE A MESSAGE FOR THE NURSE SELECT OPTION 2, PLEASE LEAVE A MESSAGE INCLUDING: YOUR NAME DATE OF BIRTH CALL BACK NUMBER REASON FOR CALL**this is important as we prioritize the call backs  YOU WILL RECEIVE A CALL BACK THE SAME DAY AS LONG AS YOU CALL BEFORE 4:00 PM

## 2021-09-11 NOTE — Progress Notes (Signed)
[facility] Note    Primary Care: Sharlene Dory, DO Primary [facility] Cardiologist: Dr Beverely Pace  HF Cardiologist: Dr. [name]  HPI: Alec Taylor is a 75 y.o. male with history of CAD s/p CABG 2004 at [facility], HTN, hyperlipidemia, DMII, obesity s/p gastric bypass and systolic HF.   Echocardiogram 12/2017  revealed LVEF of 45 to 50%.   Admitted 04/28/18 with increased dyspnea. CTA negative for PE. ECHO performed and showed reduced EF 35-40%. He underwent LHC . Stable coronary disease and grafts. Diuresed well. Discharge weight was 177 pounds.   Follow up with Dr. [name] 07/28/21, stable NYHA II, volume mildly elevated. Instructed to double lasix x 2 days. Sleep study arranged.  Echo 07/28/21 EF 45-50% (read as 50-55%).  Home Sleep Study 08/18/2021- Mild OSA AHI 11.5, no central sleep apnea, oxygen desaturation < 30 minutes.   He was seen 08/21/21. K elevated 5.9 --> entresto was stopped and given dose of lokelma. Repeat BMET on 08/29/21 K 5.2.   Today he returns for HF follow up. Complaining of sob with exertion, lower extremity edema, and fatigue. Says he is not moving around much at home.  Denies PND/Orthopnea. No chest pain. Not Appetite ok. No fever or chills. Weight at home up a few pounds at home.Taking all medications.  Cardiac studies: - Cath 3/21 1. Severe native 3v CAD with patent grafts to LAD and dRCA 2. EF 45-50% Ao = 115/51 (76) LV = 107/17 RA = 6 RV = 45/6 PA =  51/20 (33) PCW = 19 (v wave 28) Fick cardiac output/index = 8.0/4.0 PVR = 1.4 WU FA sat = 99% PA sat = 69%, 74% SVC sat = 72%  - Echo (8/22): EF 45-50% (read as 50-55%). - Echo (3/21):[name] LV dilated EF 40-45% RV ok. Personally reviewed - Echo (1/20): EF 40-45%  cMRI (8/14):  1. Normal left ventricular size with mild concentric hypertrophy and severely decreased systolic function (LVEF = 38%) with diffuse hypokinesis.  There is pericardial late gadolinium enhancement in the  basal and mid inferior walls.  2. Normal right ventricular size, thickness and borderline systolic function (LVEF = 45%). There are no regional wall motion abnormalities.  3.  Moderately dilated left atrium, mildly dilated right atrium.  4. Normal size of the aortic root, ascending aorta. Mildly dilated pulmonary artery measuring 31 mm.  5.  Mild aortic, mitral and trivial tricuspid regurgitation.  6.  Normal pericardium.  No pericardial effusion  - ECHO 04/29/2018  EF 35% to 40%. Diffuse hypokinesis, Mitral valve: Severely calcified annulus. There was mild regurgitation.  - LHC 04/30/2018  Prox RCA to Mid RCA lesion is 65% stenosed. Dist RCA-1 lesion is 85% stenosed. Dist RCA-2 lesion is 95% stenosed with 80% stenosed side branch in Post Atrio. SVG-rPDA graft was visualized by angiography and is normal in caliber and large. The graft exhibits no disease. Ost RPDA lesion is 45% stenosed. Ost RPDA to prox RPDA lesion is 90% stenosed - This limits flow retrograde to the RPAV-RPL. Ost LAD lesion is 55% stenosed. Ost 1st Diag (small caliber vessel) lesion is 70% stenosed. Prox LAD lesion is 100% stenosed. LIMA-LAD graft is large caliber, widely patent perfusing a small caliber distal LAD Prox Cx to Mid Cx stent is 25% stenosed. Dist Cx lesion is 65% stenosed. Ost 3rd Mrg to 3rd Mrg lesion is 70% stenosed. -Both limbs of the distal bifurcation. There is severe left ventricular systolic dysfunction. The left ventricular ejection fraction is less than 25%  by visual estimate. LV end diastolic pressure is moderately elevated. Significant aortic and mitral valve calcification. Calcified pericardium  Review of systems complete and found to be negative unless listed in HPI.   Past Medical History:  Diagnosis Date   CAD (coronary artery disease) 09/2007   s/p CABG approximately 2004-[facility] (Cath - 17 Sep 2007:90% prox LAD with diffuse 85% narrowing to distal segment, 80% prox CXA, RCA: prox 90%,  prox-mid 70%, mid 85%, distal 80%, D1 80%; Normal EF 65% --status post CABG--LIMA to LAD, SVG to PDA.     COPD (chronic obstructive pulmonary disease) (HCC)    Depression    Diabetes mellitus without complication (HCC)    Hypertension    Ischemic cardiomyopathy    Pleural effusion    S/P CABG x 2    CABG--LIMA to LAD, SVG to PDA.     Current Outpatient Medications  Medication Sig Dispense Refill   ALPRAZolam (XANAX) 0.25 MG tablet TAKE ONE-HALF TO ONE TABLET BY MOUTH ONE TIME DAILY AS NEEDED FOR ANXIETY 30 tablet 2   aspirin EC 81 MG tablet Take 81 mg by mouth at bedtime.      atorvastatin (LIPITOR) 20 MG tablet Take 1 tablet (20 mg total) by mouth daily. Please make an appointment for further refills 30 tablet 6   buPROPion (WELLBUTRIN XL) 150 MG 24 hr tablet Take 1 tablet (150 mg total) by mouth daily. 90 tablet 1   carvedilol (COREG) 6.25 MG tablet 6.25 mg in the morning and at bedtime.     Cholecalciferol (VITAMIN D3) 5000 units CAPS Take 5,000 Units by mouth daily.      donepezil (ARICEPT) 5 MG tablet Take 1 tablet (5 mg total) by mouth daily. 90 tablet 1   DULoxetine (CYMBALTA) 60 MG capsule Take 1 capsule (60 mg total) by mouth daily. 90 capsule 0   empagliflozin (JARDIANCE) 10 MG TABS tablet Take 1 tablet (10 mg total) by mouth daily. Please make an appointment for refills 90 tablet 3   finasteride (PROSCAR) 5 MG tablet Take 5 mg by mouth daily.   6   folic acid (FOLVITE) 1 MG tablet Take 1 mg by mouth daily.  2   furosemide (LASIX) 20 MG tablet Take 20 mg by mouth daily.     glimepiride (AMARYL) 2 MG tablet Take 2 mg by mouth every evening.  1   metFORMIN (GLUCOPHAGE-XR) 500 MG 24 hr tablet Take 500 mg by mouth 2 (two) times daily.  1   nitroGLYCERIN (NITROSTAT) 0.4 MG SL tablet Place 1 tablet (0.4 mg total) under the tongue every 5 (five) minutes as needed for chest pain. 100 tablet 3   ofloxacin (OCUFLOX) 0.3 % ophthalmic solution Place 1 drop into both eyes as needed (Before  procedure eye injections). Prior to procedures.     polyvinyl alcohol (LIQUIFILM TEARS) 1.4 % ophthalmic solution 1 drop as needed.     zolpidem (AMBIEN CR) 12.5 MG CR tablet TAKE ONE TABLET BY MOUTH ONE TIME DAILY AT BEDTIME AS NEEDED FOR SLEEP 30 tablet 2   No current facility-administered medications for this encounter.   Allergies  Allergen Reactions   Celecoxib Other (See Comments)    hyperkalemia   Losartan Other (See Comments)    hyperkalemia   Ramipril Cough        Social History   Socioeconomic History   Marital status: Legally Separated    Spouse name: Not on file   Number of children: Not on file  Years of education: Not on file   Highest education level: Not on file  Occupational History   Not on file  Tobacco Use   Smoking status: Never   Smokeless tobacco: Never  Vaping Use   Vaping Use: Never used  Substance and Sexual Activity   Alcohol use: Yes    Alcohol/week: 2.0 standard drinks    Types: 2 Cans of beer per week    Comment: occ   Drug use: Never   Sexual activity: Not on file  Other Topics Concern   Not on file  Social History Narrative   Not on file   Social Determinants of Health   Financial Resource Strain: Not on file  Food Insecurity: Not on file  Transportation Needs: Not on file  Physical Activity: Not on file  Stress: Not on file  Social Connections: Not on file  Intimate Partner Violence: Not on file    Family History  Problem Relation Age of Onset   Hypertension Father    BP 138/74   Pulse 93   Wt 85.8 kg (189 lb 3.2 oz)   SpO2 96%   BMI 27.15 kg/m   Wt Readings from Last 3 Encounters:  09/11/21 85.8 kg (189 lb 3.2 oz)  08/21/21 87.7 kg (193 lb 6.4 oz)  07/28/21 88.5 kg (195 lb 3.2 oz)     PHYSICAL EXAM: General:  Walked in the clinic. No resp difficulty HEENT: normal Neck: supple. JVP 24f0-11 Carotids 2+ bilat; no bruits. No lymphadenopathy or thryomegaly appreciated. Cor: PMI nondisplaced. Regular rate & rhythm.  No rubs, gallops or murmurs. Lungs: clear Abdomen: soft, nontender, nondistended. No hepatosplenomegaly. No bruits or masses. Good bowel sounds. Extremities: no cyanosis, clubbing, rash, edema Neuro: alert & orientedx3, cranial nerves grossly intact. moves all 4 extremities w/o difficulty. Affect pleasant  EKG: SR 93 bpm   ReDs:40%    ASSESSMENT & PLAN:  1. Chronic Systolic Heart Failure due to ICM - EF 1/19 45-50% - Echo 5/19 EF 35-40%. ?Viral versus PVC on top of iCM - cMRI 8/19 EF 38% pericardial late gadolinium enhancement in the basal and mid inferior walls. Suspicious for recent myocarditis. - Echo 3/21 EF 40-45% - Cath 3/21: Underwent R/L cath with stable anatomy and no targets for PCI. - Echo 07/28/21 EF 45-50% (read as 50-55%)  - Reds Clip 40%. NYHA III. Volume status mildly elevated suspect this may be due to discontinuation of entresto. Increase lasix 40 mg daily.  - Off entresto and spiro due to hyperkalemia. Persistent hyperkalemia for the last year.  - Continue carvedilol 6.25 mg daily. - Continue Jardiance 10 mg daily. - Set up CPX to try and explain dyspnea. - Check BMET    2. CAD S/P CABG  - LHC 04/30/2018 severe multivessel disease and patent LIMA-LAD SVG-PDA - Cath 3/21 stable anatomy. No PCI targets. -No chest pain - Continue statin, BB, and ASA.  3. Anemia - Secondary to gastric bypass surgery - Iron stores have been low.  - He continues to receive feraheme. Last dose was back July.  - He has recurrent iron deficiency. Followed by PCP and GI.  4. DM2 - Per PCP  - Continue Jardiance 10 mg daily.  5. PVCs - No palpitations currently.   6. Mild Sleep Apnea - Home sleep study desaturations at night an mild sleep apnea. He has a virtual call with Dr Mayford Knife.   Follow up in with Dr [name] in a few months to discuss CPX results.   Marquesha Robideau  Filbert Schilder, NP  3:18 PM

## 2021-09-14 ENCOUNTER — Telehealth (HOSPITAL_COMMUNITY): Payer: Self-pay

## 2021-09-14 DIAGNOSIS — E875 Hyperkalemia: Secondary | ICD-10-CM

## 2021-09-14 DIAGNOSIS — I5022 Chronic systolic (congestive) heart failure: Secondary | ICD-10-CM

## 2021-09-14 NOTE — Telephone Encounter (Signed)
Patient advised and verbalized understanding. Patient will have lab work done at [facility]. Lab order entered and released.   Orders Placed This Encounter  Procedures   Basic metabolic panel    Standing Status:   Future    Number of Occurrences:   1    Standing Expiration Date:   09/14/2022    Order Specific Question:   Release to patient    Answer:   Immediate

## 2021-09-14 NOTE — Telephone Encounter (Signed)
-----   Message from Sherald Hess, NP sent at 09/12/2021  3:09 PM EDT ----- Please ask him to avoid high potassium foods. Stop MVI. Check BMET in 7 days.

## 2021-09-18 ENCOUNTER — Other Ambulatory Visit: Payer: Self-pay | Admitting: Psychiatry

## 2021-09-18 ENCOUNTER — Other Ambulatory Visit: Payer: Self-pay | Admitting: Cardiology

## 2021-09-18 DIAGNOSIS — F5101 Primary insomnia: Secondary | ICD-10-CM

## 2021-09-19 LAB — BASIC METABOLIC PANEL
BUN/Creatinine Ratio: 32 — ABNORMAL HIGH (ref 10–24)
BUN: 37 mg/dL — ABNORMAL HIGH (ref 8–27)
CO2: 25 mmol/L (ref 20–29)
Calcium: 9.2 mg/dL (ref 8.6–10.2)
Chloride: 99 mmol/L (ref 96–106)
Creatinine, Ser: 1.15 mg/dL (ref 0.76–1.27)
Glucose: 133 mg/dL — ABNORMAL HIGH (ref 70–99)
Potassium: 4.7 mmol/L (ref 3.5–5.2)
Sodium: 141 mmol/L (ref 134–144)
eGFR: 67 mL/min/{1.73_m2} (ref >59)

## 2021-09-19 NOTE — Telephone Encounter (Signed)
Last filled 9/9 appt on 11/8

## 2021-10-03 ENCOUNTER — Encounter: Payer: Self-pay | Admitting: Family Medicine

## 2021-10-03 ENCOUNTER — Telehealth (INDEPENDENT_AMBULATORY_CARE_PROVIDER_SITE_OTHER): Payer: Medicare Other | Admitting: Family Medicine

## 2021-10-03 ENCOUNTER — Other Ambulatory Visit: Payer: Self-pay

## 2021-10-03 DIAGNOSIS — J0100 Acute maxillary sinusitis, unspecified: Secondary | ICD-10-CM | POA: Diagnosis not present

## 2021-10-03 MED ORDER — DOXYCYCLINE HYCLATE 100 MG PO TABS
100.0000 mg | ORAL_TABLET | Freq: Two times a day (BID) | ORAL | 0 refills | Status: DC
Start: 2021-10-03 — End: 2021-10-10

## 2021-10-03 MED ORDER — PREDNISONE 20 MG PO TABS
40.0000 mg | ORAL_TABLET | Freq: Every day | ORAL | 0 refills | Status: AC
Start: 2021-10-03 — End: 2021-10-08

## 2021-10-03 NOTE — Progress Notes (Signed)
Chief Complaint  Patient presents with   Sinusitis    Alec Taylor here for URI complaints. Due to COVID-19 pandemic, we are interacting via telephone I verified patient's ID using 2 identifiers. Patient agreed to proceed with visit via this method. Patient is at home, I am at office. Patient and I are present for visit.   Duration: 3 weeks  Associated symptoms: sinus pain, rhinorrhea, itchy watery eyes, dental pain, wheezing, shortness of breath, slight loss of taste/smell, and coughing Denies: ear fullness, ear pain, ear drainage, sore throat, myalgia, and fevers, N/V/D Treatment to date: Claritin, coricidin Sick contacts: No Tested - for covid.   Past Medical History:  Diagnosis Date   CAD (coronary artery disease) 09/2007   s/p CABG approximately 2004-[facility] (Cath - 17 Sep 2007:90% prox LAD with diffuse 85% narrowing to distal segment, 80% prox CXA, RCA: prox 90%, prox-mid 70%, mid 85%, distal 80%, D1 80%; Normal EF 65% --status post CABG--LIMA to LAD, SVG to PDA.     COPD (chronic obstructive pulmonary disease) (HCC)    Depression    Diabetes mellitus without complication (HCC)    Hypertension    Ischemic cardiomyopathy    Pleural effusion    S/P CABG x 2    CABG--LIMA to LAD, SVG to PDA.      Objective No conversational dyspnea Age appropriate judgment and insight Nml affect and mood  Acute maxillary sinusitis, recurrence not specified - Plan: predniSONE (DELTASONE) 20 MG tablet, doxycycline (VIBRA-TABS) 100 MG tablet  Continue to push fluids, practice good hand hygiene, cover mouth when coughing. F/u prn. If starting to experience fevers, shaking, or shortness of breath, seek immediate care. Total time: 11 min Pt voiced understanding and agreement to the plan.  Jilda Roche [place], DO 10/03/21 9:15 AM

## 2021-10-09 ENCOUNTER — Encounter (HOSPITAL_COMMUNITY): Payer: Self-pay

## 2021-10-09 ENCOUNTER — Observation Stay (HOSPITAL_COMMUNITY)
Admission: EM | Admit: 2021-10-09 | Discharge: 2021-10-10 | Disposition: A | Discharge status: Home / Self Care | Payer: Medicare Other | Attending: Internal Medicine | Admitting: Internal Medicine

## 2021-10-09 ENCOUNTER — Ambulatory Visit (HOSPITAL_COMMUNITY): Payer: Medicare Other

## 2021-10-09 ENCOUNTER — Emergency Department (HOSPITAL_COMMUNITY): Payer: Medicare Other

## 2021-10-09 ENCOUNTER — Encounter (HOSPITAL_COMMUNITY): Payer: Self-pay | Admitting: *Deleted

## 2021-10-09 ENCOUNTER — Other Ambulatory Visit: Payer: Self-pay

## 2021-10-09 ENCOUNTER — Ambulatory Visit (HOSPITAL_COMMUNITY)
Admission: RE | Admit: 2021-10-09 | Discharge: 2021-10-09 | Disposition: A | Discharge status: Home / Self Care | Payer: Medicare Other | Source: Ambulatory Visit | Attending: Adult Health | Admitting: Adult Health

## 2021-10-09 VITALS — BP 90/66 | HR 149 | Wt 181.6 lb

## 2021-10-09 DIAGNOSIS — Z8249 Family history of ischemic heart disease and other diseases of the circulatory system: Secondary | ICD-10-CM | POA: Insufficient documentation

## 2021-10-09 DIAGNOSIS — Z951 Presence of aortocoronary bypass graft: Secondary | ICD-10-CM | POA: Insufficient documentation

## 2021-10-09 DIAGNOSIS — E119 Type 2 diabetes mellitus without complications: Secondary | ICD-10-CM | POA: Insufficient documentation

## 2021-10-09 DIAGNOSIS — Z79899 Other long term (current) drug therapy: Secondary | ICD-10-CM | POA: Insufficient documentation

## 2021-10-09 DIAGNOSIS — Z6826 Body mass index (BMI) 26.0-26.9, adult: Secondary | ICD-10-CM | POA: Insufficient documentation

## 2021-10-09 DIAGNOSIS — I110 Hypertensive heart disease with heart failure: Secondary | ICD-10-CM | POA: Insufficient documentation

## 2021-10-09 DIAGNOSIS — I5022 Chronic systolic (congestive) heart failure: Secondary | ICD-10-CM

## 2021-10-09 DIAGNOSIS — E875 Hyperkalemia: Secondary | ICD-10-CM

## 2021-10-09 DIAGNOSIS — Z7982 Long term (current) use of aspirin: Secondary | ICD-10-CM | POA: Insufficient documentation

## 2021-10-09 DIAGNOSIS — R000 Tachycardia, unspecified: Secondary | ICD-10-CM

## 2021-10-09 DIAGNOSIS — I083 Combined rheumatic disorders of mitral, aortic and tricuspid valves: Secondary | ICD-10-CM | POA: Insufficient documentation

## 2021-10-09 DIAGNOSIS — I4719 Other supraventricular tachycardia: Secondary | ICD-10-CM

## 2021-10-09 DIAGNOSIS — Z7984 Long term (current) use of oral hypoglycemic drugs: Secondary | ICD-10-CM | POA: Insufficient documentation

## 2021-10-09 DIAGNOSIS — I214 Non-ST elevation (NSTEMI) myocardial infarction: Secondary | ICD-10-CM | POA: Diagnosis present

## 2021-10-09 DIAGNOSIS — J069 Acute upper respiratory infection, unspecified: Secondary | ICD-10-CM | POA: Insufficient documentation

## 2021-10-09 DIAGNOSIS — I2510 Atherosclerotic heart disease of native coronary artery without angina pectoris: Principal | ICD-10-CM | POA: Diagnosis present

## 2021-10-09 DIAGNOSIS — E785 Hyperlipidemia, unspecified: Secondary | ICD-10-CM | POA: Insufficient documentation

## 2021-10-09 DIAGNOSIS — I255 Ischemic cardiomyopathy: Secondary | ICD-10-CM

## 2021-10-09 DIAGNOSIS — R42 Dizziness and giddiness: Secondary | ICD-10-CM | POA: Insufficient documentation

## 2021-10-09 DIAGNOSIS — Z20822 Contact with and (suspected) exposure to covid-19: Secondary | ICD-10-CM | POA: Insufficient documentation

## 2021-10-09 DIAGNOSIS — D508 Other iron deficiency anemias: Secondary | ICD-10-CM

## 2021-10-09 DIAGNOSIS — R079 Chest pain, unspecified: Secondary | ICD-10-CM

## 2021-10-09 DIAGNOSIS — J449 Chronic obstructive pulmonary disease, unspecified: Secondary | ICD-10-CM | POA: Insufficient documentation

## 2021-10-09 DIAGNOSIS — D509 Iron deficiency anemia, unspecified: Secondary | ICD-10-CM | POA: Insufficient documentation

## 2021-10-09 DIAGNOSIS — I471 Supraventricular tachycardia: Secondary | ICD-10-CM | POA: Insufficient documentation

## 2021-10-09 DIAGNOSIS — Z888 Allergy status to other drugs, medicaments and biological substances status: Secondary | ICD-10-CM | POA: Insufficient documentation

## 2021-10-09 DIAGNOSIS — G4733 Obstructive sleep apnea (adult) (pediatric): Secondary | ICD-10-CM | POA: Insufficient documentation

## 2021-10-09 DIAGNOSIS — Z8774 Personal history of (corrected) congenital malformations of heart and circulatory system: Secondary | ICD-10-CM | POA: Insufficient documentation

## 2021-10-09 DIAGNOSIS — Z9884 Bariatric surgery status: Secondary | ICD-10-CM | POA: Insufficient documentation

## 2021-10-09 DIAGNOSIS — E669 Obesity, unspecified: Secondary | ICD-10-CM | POA: Insufficient documentation

## 2021-10-09 LAB — BASIC METABOLIC PANEL
Anion gap: 10 (ref 5–15)
Anion gap: 11 (ref 5–15)
Anion gap: 9 (ref 5–15)
BUN: 38 mg/dL — ABNORMAL HIGH (ref 8–23)
BUN: 38 mg/dL — ABNORMAL HIGH (ref 8–23)
BUN: 37 mg/dL — ABNORMAL HIGH (ref 8–23)
CO2: 30 mmol/L (ref 22–32)
CO2: 29 mmol/L (ref 22–32)
CO2: 28 mmol/L (ref 22–32)
Calcium: 9.5 mg/dL (ref 8.9–10.3)
Calcium: 9 mg/dL (ref 8.9–10.3)
Calcium: 8.9 mg/dL (ref 8.9–10.3)
Chloride: 98 mmol/L (ref 98–111)
Chloride: 97 mmol/L — ABNORMAL LOW (ref 98–111)
Chloride: 100 mmol/L (ref 98–111)
Creatinine, Ser: 1.13 mg/dL (ref 0.61–1.24)
Creatinine, Ser: 1.14 mg/dL (ref 0.61–1.24)
Creatinine, Ser: 1.06 mg/dL (ref 0.61–1.24)
GFR, Estimated: >60 mL/min (ref >60)
GFR, Estimated: >60 mL/min (ref >60)
GFR, Estimated: >60 mL/min (ref >60)
Glucose, Bld: 197 mg/dL — ABNORMAL HIGH (ref 70–99)
Glucose, Bld: 201 mg/dL — ABNORMAL HIGH (ref 70–99)
Glucose, Bld: 171 mg/dL — ABNORMAL HIGH (ref 70–99)
Potassium: 5.4 mmol/L — ABNORMAL HIGH (ref 3.5–5.1)
Potassium: 4.4 mmol/L (ref 3.5–5.1)
Potassium: 4.1 mmol/L (ref 3.5–5.1)
Sodium: 138 mmol/L (ref 135–145)
Sodium: 137 mmol/L (ref 135–145)
Sodium: 137 mmol/L (ref 135–145)

## 2021-10-09 LAB — CBC
HCT: 44.9 % (ref 39.0–52.0)
HCT: 42.5 % (ref 39.0–52.0)
Hemoglobin: 14 g/dL (ref 13.0–17.0)
Hemoglobin: 13.1 g/dL (ref 13.0–17.0)
MCH: 28.2 pg (ref 26.0–34.0)
MCH: 28.1 pg (ref 26.0–34.0)
MCHC: 31.2 g/dL (ref 30.0–36.0)
MCHC: 30.8 g/dL (ref 30.0–36.0)
MCV: 90.5 fL (ref 80.0–100.0)
MCV: 91 fL (ref 80.0–100.0)
Platelets: 344 10*3/uL (ref 150–400)
Platelets: 299 10*3/uL (ref 150–400)
RBC: 4.96 MIL/uL (ref 4.22–5.81)
RBC: 4.67 MIL/uL (ref 4.22–5.81)
RDW: 13.2 % (ref 11.5–15.5)
RDW: 13.1 % (ref 11.5–15.5)
WBC: 10.2 10*3/uL (ref 4.0–10.5)
WBC: 9.1 10*3/uL (ref 4.0–10.5)
nRBC: 0 % (ref 0.0–0.2)
nRBC: 0 % (ref 0.0–0.2)

## 2021-10-09 LAB — TROPONIN I (HIGH SENSITIVITY)
Troponin I (High Sensitivity): 1100 ng/L (ref <18)
Troponin I (High Sensitivity): 1139 ng/L (ref <18)
Troponin I (High Sensitivity): 1219 ng/L (ref <18)

## 2021-10-09 LAB — BRAIN NATRIURETIC PEPTIDE: B Natriuretic Peptide: 580.7 pg/mL — ABNORMAL HIGH (ref 0.0–100.0)

## 2021-10-09 LAB — TSH: TSH: 3.793 u[IU]/mL (ref 0.350–4.500)

## 2021-10-09 MED ORDER — SODIUM CHLORIDE 0.9 % IV SOLN: 250.0000 mL | INTRAVENOUS | Status: DC | PRN

## 2021-10-09 MED ORDER — SODIUM CHLORIDE 0.9 % IV SOLN: INTRAVENOUS | Status: DC

## 2021-10-09 MED ORDER — SODIUM CHLORIDE 0.9% FLUSH
3.0000 mL | Freq: Two times a day (BID) | INTRAVENOUS | Status: DC
  Administered 2021-10-09: 3 mL via INTRAVENOUS

## 2021-10-09 MED ORDER — HEPARIN (PORCINE) 25000 UT/250ML-% IV SOLN
1250.0000 [IU]/h | INTRAVENOUS | Status: DC
  Administered 2021-10-09: 1000 [IU]/h via INTRAVENOUS
  Administered 2021-10-10: 1250 [IU]/h via INTRAVENOUS
  Filled 2021-10-09 (×2): qty 250

## 2021-10-09 MED ORDER — ASPIRIN 81 MG PO CHEW
81.0000 mg | CHEWABLE_TABLET | ORAL | Status: AC
  Administered 2021-10-10: 81 mg via ORAL
  Filled 2021-10-09: qty 1

## 2021-10-09 MED ORDER — SODIUM CHLORIDE 0.9% FLUSH: 3.0000 mL | INTRAVENOUS | Status: DC | PRN

## 2021-10-09 MED ORDER — HEPARIN BOLUS VIA INFUSION
4000.0000 [IU] | Freq: Once | INTRAVENOUS | Status: AC
  Administered 2021-10-09: 4000 [IU] via INTRAVENOUS
  Filled 2021-10-09: qty 4000

## 2021-10-09 MED ORDER — SODIUM ZIRCONIUM CYCLOSILICATE 5 G PO PACK: 5.0000 g | PACK | Freq: Once | ORAL | Status: DC

## 2021-10-09 NOTE — Progress Notes (Addendum)
ANTICOAGULATION CONSULT NOTE - Initial Consult  Pharmacy Consult for Heparin Indication: chest pain/ACS  Allergies  Allergen Reactions   Celecoxib Other (See Comments)    hyperkalemia   Losartan Other (See Comments)    hyperkalemia   Ramipril Cough         Patient Measurements: Height: 5\' 10"  (177.8 cm) Weight: 82.4 kg (181 lb 10.5 oz) IBW/kg (Calculated) : 73 Heparin Dosing Weight: 82.4 kg  Vital Signs: Temp: 98.2 F (36.8 C) (10/31 1659) Temp Source: Oral (10/31 1659) BP: 123/64 (10/31 1649) Pulse Rate: 90 (10/31 1649)  Labs: Recent Labs    10/09/21 1352 10/09/21 1609  HGB 14.0 13.1  HCT 44.9 42.5  PLT 344 299  CREATININE 1.13 1.14  TROPONINIHS 1,100* 1,139*    Estimated Creatinine Clearance: 59.6 mL/min (by C-G formula based on SCr of 1.14 mg/dL).   Medical History: Past Medical History:  Diagnosis Date   CAD (coronary artery disease) 09/2007   s/p CABG approximately 2004-[facility] (Cath - 17 Sep 2007:90% prox LAD with diffuse 85% narrowing to distal segment, 80% prox CXA, RCA: prox 90%, prox-mid 70%, mid 85%, distal 80%, D1 80%; Normal EF 65% --status post CABG--LIMA to LAD, SVG to PDA.     COPD (chronic obstructive pulmonary disease) (HCC)    Depression    Diabetes mellitus without complication (HCC)    Hypertension    Ischemic cardiomyopathy    Pleural effusion    S/P CABG x 2    CABG--LIMA to LAD, SVG to PDA.       Assessment: 75 yo male admitted 10/09/2021 for NSTEMI. Pharmacy consulted to dose heparin. Patient is not on anticoagulation prior to admission.   Goal of Therapy:  Heparin level 0.3-0.7 units/ml Monitor platelets by anticoagulation protocol: Yes   Plan:  Heparin 4000 units x1 bolus  Heparin 1000 units/hr  Check 8 hr heparin level Monitor heparin level, CBC and s/s of bleeding daily  10/11/2021, PharmD, BCPS Clinical Pharmacist 10/09/2021 5:50 PM

## 2021-10-09 NOTE — H&P (Addendum)
Advanced Heart Failure Team History and Physical Note   PCP:  Shelda Pal, DO  PCP-Cardiology: Sherren Mocha, MD    [facility]: Dr. Haroldine Laws   Reason for Admission: NSTEMI    HPI:    Alec Taylor is a 75 y.o. male with history of CAD s/p CABG 2004 at [facility], HTN, hyperlipidemia, DMII, obesity s/p gastric bypass and systolic HF.    Echocardiogram 12/2017  revealed LVEF of 45 to 50%.    Admitted 04/28/18 with increased dyspnea. CTA negative for PE. ECHO performed and showed reduced EF 35-40%. He underwent LHC . Stable coronary disease and grafts. Diuresed well. Discharge weight was 177 pounds.    Follow up with Dr. Haroldine Laws 07/28/21, stable NYHA II, volume mildly elevated. Instructed to double lasix x 2 days. Sleep study arranged.   Echo 07/28/21 EF 45-50% (read as 50-55%).   Home Sleep Study 08/18/2021- Mild OSA AHI 11.5, no central sleep apnea, oxygen desaturation < 30 minutes.    He was seen 08/21/21. K elevated 5.9 --> entresto was stopped and given dose of lokelma. Repeat BMET on 08/29/21 K 5.2.    On 10/03/21 had video visit with PCP. Treated for acute sinusitis. Placed on prednisone and doxcycline. Completed  course 10/08/21.   On Friday of last week, he had chest pain radiating to left arm and back. Says he took tylenol and felt better. No chest pain for the last 2 days. Complaining of dizziness and feeling spaced out. Denies PND/Orthopnea.  No BRBPR. Appetite ok. No fever or chills.  Taking all medications.    Today he presented for CPX test and EKG showed Sinus Tach 145 bpm. Unable to break with carotid massage. SBP 90s. ReDs Clip 31%. CPX was cancelled. Referred to ED for further evaluation and found to have elevated Hs trop 1,100. SCr 1.13. K 4.4. CBC normal,  WBC 10.2, Hgb 14. CXR shows no new infiltrates or signs of pulmonary edema. TSH normal.   When I arrived to ED exam room, he was back in NSR w/ PACs, HR 80s. BP improved 123/64. No active CP.     Review of  Systems: [y] = yes, [ ]  = no   General: Weight gain [ ] ; Weight loss [ ] ; Anorexia [ ] ; Fatigue [ ] ; Fever [ ] ; Chills [ ] ; Weakness [ ]   Cardiac: Chest pain/pressure [Y ]; Resting SOB [ ] ; Exertional SOB [ ] ; Orthopnea [ ] ; Pedal Edema [ ] ; Palpitations [ Y]; Syncope [ ] ; Presyncope [Y ]; Paroxysmal nocturnal dyspnea[ ]   Pulmonary: Cough [ ] ; Wheezing[ ] ; Hemoptysis[ ] ; Sputum [ ] ; Snoring [ ]   GI: Vomiting[ ] ; Dysphagia[ ] ; Melena[ ] ; Hematochezia [ ] ; Heartburn[ ] ; Abdominal pain [ ] ; Constipation [ ] ; Diarrhea [ ] ; BRBPR [ ]   GU: Hematuria[ ] ; Dysuria [ ] ; Nocturia[ ]   Vascular: Pain in legs with walking [ ] ; Pain in feet with lying flat [ ] ; Non-healing sores [ ] ; Stroke [ ] ; TIA [ ] ; Slurred speech [ ] ;  Neuro: Headaches[ ] ; Vertigo[ ] ; Seizures[ ] ; Paresthesias[ ] ;Blurred vision [ ] ; Diplopia [ ] ; Vision changes [ ]   Ortho/Skin: Arthritis [ ] ; Joint pain [ ] ; Muscle pain [ ] ; Joint swelling [ ] ; Back Pain [ ] ; Rash [ ]   Psych: Depression[ ] ; Anxiety[ ]   Heme: Bleeding problems [ ] ; Clotting disorders [ ] ; Anemia [ ]   Endocrine: Diabetes [ ] ; Thyroid dysfunction[ ]    Home Medications Prior to Admission medications   Medication Sig  Start Date End Date Taking? Authorizing Provider  ALPRAZolam Duanne Moron) 0.25 MG tablet TAKE ONE-HALF TO ONE TABLET BY MOUTH ONE TIME DAILY AS NEEDED FOR ANXIETY 06/19/21   Thayer Headings, PMHNP  aspirin EC 81 MG tablet Take 81 mg by mouth at bedtime.     [provider]  atorvastatin (LIPITOR) 20 MG tablet Take 1 tablet (20 mg total) by mouth daily. Please make an appointment for further refills 05/15/21   Zeniya Lapidus, Shaune Pascal, MD  buPROPion (WELLBUTRIN XL) 150 MG 24 hr tablet Take 1 tablet (150 mg total) by mouth daily. 07/17/21   Thayer Headings, PMHNP  carvedilol (COREG) 6.25 MG tablet 6.25 mg in the morning and at bedtime.    [provider]  Cholecalciferol (VITAMIN D3) 5000 units CAPS Take 5,000 Units by mouth daily.  12/19/17   [provider]  donepezil (ARICEPT) 5 MG tablet Take 1 tablet (5 mg total) by mouth daily. 07/17/21 10/15/21  Thayer Headings, PMHNP  doxycycline (VIBRA-TABS) 100 MG tablet Take 1 tablet (100 mg total) by mouth 2 (two) times daily for 7 days. 10/03/21 10/10/21  Shelda Pal, DO  DULoxetine (CYMBALTA) 60 MG capsule Take 1 capsule (60 mg total) by mouth daily. 07/17/21   Thayer Headings, PMHNP  empagliflozin (JARDIANCE) 10 MG TABS tablet Take 1 tablet (10 mg total) by mouth daily. Please make an appointment for refills 06/14/21   Shannan Garfinkel, Shaune Pascal, MD  finasteride (PROSCAR) 5 MG tablet Take 5 mg by mouth daily.  04/20/18   [provider]  folic acid (FOLVITE) 1 MG tablet Take 1 mg by mouth daily. 04/20/18   [provider]  furosemide (LASIX) 20 MG tablet Take 2 tablets (40 mg total) by mouth daily. 09/11/21   Clegg, Amy D, NP  glimepiride (AMARYL) 2 MG tablet Take 2 mg by mouth every evening. 04/20/18   [provider]  metFORMIN (GLUCOPHAGE-XR) 500 MG 24 hr tablet Take 500 mg by mouth 2 (two) times daily. 04/20/18   [provider]  nitroGLYCERIN (NITROSTAT) 0.4 MG SL tablet Place 1 tablet (0.4 mg total) under the tongue every 5 (five) minutes as needed for chest pain. 05/09/21 05/09/22  Clegg, Amy D, NP  ofloxacin (OCUFLOX) 0.3 % ophthalmic solution Place 1 drop into both eyes as needed (Before procedure eye injections). Prior to procedures.    [provider]  polyvinyl alcohol (LIQUIFILM TEARS) 1.4 % ophthalmic solution 1 drop as needed.    [provider]  zolpidem (AMBIEN CR) 12.5 MG CR tablet TAKE ONE TABLET BY MOUTH EVERY NIGHT AT BEDTIME AS NEEDED FOR SLEEP 09/20/21   Thayer Headings, PMHNP    Past Medical History: Past Medical History:  Diagnosis Date   CAD (coronary artery disease) 09/2007   s/p CABG approximately 2004-[facility] (Cath - 17 Sep 2007:90% prox LAD with diffuse 85% narrowing to distal segment, 80% prox CXA, RCA: prox 90%,  prox-mid 70%, mid 85%, distal 80%, D1 80%; Normal EF 65% --status post CABG--LIMA to LAD, SVG to PDA.     COPD (chronic obstructive pulmonary disease) (HCC)    Depression    Diabetes mellitus without complication (HCC)    Hypertension    Ischemic cardiomyopathy    Pleural effusion    S/P CABG x 2    CABG--LIMA to LAD, SVG to PDA.      Past Surgical History: Past Surgical History:  Procedure Laterality Date   APPENDECTOMY     CHOLECYSTECTOMY     CORONARY ARTERY  BYPASS GRAFT     GASTRIC BYPASS     LEFT HEART CATH AND CORS/GRAFTS ANGIOGRAPHY N/A 04/30/2018   Procedure: LEFT HEART CATH AND CORS/GRAFTS ANGIOGRAPHY;  Surgeon: Leonie Man, MD;  Location: [place] [facility];  Service: Cardiovascular;  Laterality: N/A;   RIGHT/LEFT HEART CATH AND CORONARY/GRAFT ANGIOGRAPHY N/A 02/18/2020   Procedure: RIGHT/LEFT HEART CATH AND CORONARY/GRAFT ANGIOGRAPHY;  Surgeon: Jolaine Artist, MD;  Location: [place] [facility];  Service: Cardiovascular;  Laterality: N/A;   TOE AMPUTATION      Family History:  Family History  Problem Relation Age of Onset   Hypertension Father     Social History: Social History   Socioeconomic History   Marital status: Legally Separated    Spouse name: Not on file   Number of children: Not on file   Years of education: Not on file   Highest education level: Not on file  Occupational History   Not on file  Tobacco Use   Smoking status: Never   Smokeless tobacco: Never  Vaping Use   Vaping Use: Never used  Substance and Sexual Activity   Alcohol use: Yes    Alcohol/week: 2.0 standard drinks    Types: 2 Cans of beer per week    Comment: occ   Drug use: Never   Sexual activity: Not on file  Other Topics Concern   Not on file  Social History Narrative   Not on file   Social Determinants of Health   Financial Resource Strain: Not on file  Food Insecurity: Not on file  Transportation Needs: Not on file  Physical Activity: Not on file  Stress:  Not on file  Social Connections: Not on file    Allergies:  Allergies  Allergen Reactions   Celecoxib Other (See Comments)    hyperkalemia   Losartan Other (See Comments)    hyperkalemia   Ramipril Cough         Objective:    Vital Signs:   Temp:  [98.4 F (36.9 C)] 98.4 F (36.9 C) (10/31 1524) Pulse Rate:  [145] 145 (10/31 1524) Resp:  [16] 16 (10/31 1524) BP: (110)/(76) 110/76 (10/31 1524) SpO2:  [99 %] 99 % (10/31 1524)   There were no vitals filed for this visit.   Physical Exam     General:  Well appearing. No respiratory difficulty HEENT: Normal Neck: Supple. no JVD. Carotids 2+ bilat; no bruits. No lymphadenopathy or thyromegaly appreciated. Cor: PMI nondisplaced. Irregular rhythm (PACs). No rubs, gallops or murmurs. Lungs: Clear Abdomen: Soft, nontender, nondistended. No hepatosplenomegaly. No bruits or masses. Good bowel sounds. Extremities: No cyanosis, clubbing, rash, edema Neuro: Alert & oriented x 3, cranial nerves grossly intact. moves all 4 extremities w/o difficulty. Affect pleasant.   Telemetry   Now NSR w/ frequent PACs, 80s   EKG   Atrial Tach vs Atrial Flutter 145 bpm   Labs     Basic Metabolic Panel: Recent Labs  Lab 10/09/21 1352  NA 138  K 5.4*  CL 98  CO2 30  GLUCOSE 197*  BUN 38*  CREATININE 1.13  CALCIUM 9.5    Liver Function Tests: No results for input(s): AST, ALT, ALKPHOS, BILITOT, PROT, ALBUMIN in the last 168 hours. No results for input(s): LIPASE, AMYLASE in the last 168 hours. No results for input(s): AMMONIA in the last 168 hours.  CBC: Recent Labs  Lab 10/09/21 1352 10/09/21 1609  WBC 10.2 9.1  HGB 14.0 13.1  HCT 44.9 42.5  MCV 90.5 91.0  PLT 344 299    Cardiac Enzymes: No results for input(s): CKTOTAL, CKMB, CKMBINDEX, TROPONINI in the last 168 hours.  BNP: BNP (last 3 results) Recent Labs    07/28/21 1146 08/21/21 1629  BNP 425.6* 547.2*    ProBNP (last 3 results) No results for  input(s): PROBNP in the last 8760 hours.   CBG: No results for input(s): GLUCAP in the last 168 hours.  Coagulation Studies: No results for input(s): LABPROT, INR in the last 72 hours.  Imaging: DG Chest 2 View  Result Date: 10/09/2021 CLINICAL DATA:  Palpitations EXAM: CHEST - 2 VIEW COMPARISON:  09/02/2018 FINDINGS: Transverse diameter of heart is increased. There is evidence of previous coronary bypass surgery. Dense calcification is seen in the region of mitral annulus. Lung fields are clear of any infiltrates or pulmonary edema. There is no pleural effusion or pneumothorax. IMPRESSION: There are no new infiltrates or signs of pulmonary edema. Electronically Signed   By: Ernie Avena M.D.   On: 10/09/2021 16:18    Assessment/Plan   Tachycardia - Atrial Tach vs AFL, initial rates 140s  - spontaneous conversion to NSR w/ PACs, HR now in 80s  - TSH normal, CBC WNL - K 4.4 - Monitor on Tele - Continue home Coreg, 6.25 mg bid   2. NSTEMI w/ H/o CAD - h/o CABG in 2004 in [place]  - LHC 04/30/2018 severe multivessel disease and patent LIMA-LAD SVG-PDA - Cath 3/21 stable anatomy. No PCI targets. - Had chest pain over the weekend.  - HS trop 1100. ? Demand ischemia from tachycardia vs ACS. Cycle x 3  - cover w/ IV heparin for now, plan repeat LHC tomorrow  - ASA + statin + ? blocker   3. Chronic Systolic Heart Failure  - Ischemic CM  - EF 1/19 45-50% - Echo 5/19 EF 35-40%. ?Viral versus PVC on top of iCM - cMRI 8/19 EF 38% pericardial late gadolinium enhancement in the basal and mid inferior walls. Suspicious for recent myocarditis. - Echo 3/21 EF 40-45% - Cath 3/21: Underwent R/L cath with stable anatomy and no targets for PCI. - Echo 07/28/21 EF 45-50% (read as 50-55%)  NYHA III. Volume status low. Reds Clip 31%. CXR no edema - Hold lasix and jardiance.    - Off entresto and spiro due to hyperkalemia. Persistent hyperkalemia for the last year.  - Continue carvedilol 6.25 mg  daily. - Repeat echo     Robbie Lis, PA-C 10/09/2021, 4:26 PM  Advanced Heart Failure Team Pager [phone] (M-F; 7a - 5p)  Please contact [facility] for night-coverage after hours (4p -7a ) and weekends on [email or web address]  Patient seen and examined with the above-signed Advanced Practice Provider and/or Housestaff. I personally reviewed laboratory data, imaging studies and relevant notes. I independently examined the patient and formulated the important aspects of the plan. I have edited the note to reflect any of my changes or salient points. I have personally discussed the plan with the patient and/or family.  75 y/o male with h/o CAD s/p CABG and systolic HF with EF 45-50%. Developed shoulder and left arm pain several days ago which lasted for about 5 hours and then resolved. Came to clinic for CPX test and found to be tachycardic with HRs 140s. ECG with AT vs AFT at 145 of unknown duration. Mover to ER and broke spontaneously. Hstrop 1200. ECG without acute ischemic changes. Currently denies CP or shoulder/arm pain.   General:  Elderly male .  No resp difficulty HEENT: normal Neck: supple. no JVD. Carotids 2+ bilat; no bruits. No lymphadenopathy or thryomegaly appreciated. Cor: PMI nondisplaced. Regular rate & rhythm. No rubs, gallops or murmurs. Lungs: clear Abdomen: soft, nontender, nondistended. No hepatosplenomegaly. No bruits or masses. Good bowel sounds. Extremities: no cyanosis, clubbing, rash, edema Neuro: alert & orientedx3, cranial nerves grossly intact. moves all 4 extremities w/o difficulty. Affect pleasant  Unclear if he had an ischemic event several days ago leading to SVT and troponin elevation or if SVT was the primary event. Would admit. Cycle enzymes. Start heparin. Plan cath tomorrow. If cath stable may need to consider anti-arrhythmic agent +/- outpatient monitor.  D/w him and his family.  Glori Bickers, MD  11:13 PM

## 2021-10-09 NOTE — ED Provider Notes (Addendum)
[facility] EMERGENCY DEPARTMENT Provider Note   CSN: [id number] Arrival date & time: 10/09/21  1449     History Chief Complaint  Patient presents with   Tachycardia    Alec Taylor is a 75 y.o. male.  HPI Patient presents from heart failure clinic.  Found to be in a tachycardia.  Thought to be atrial tachycardia versus atrial fib.  However upon arrival has converted to a slower rate and appears to be back in sinus rhythm.  Has had URI symptoms for last few days.  Has been on some cough medicines.  Cardiology is already here to admit patient.  Does have some chest tightness.    Past Medical History:  Diagnosis Date   CAD (coronary artery disease) 09/2007   s/p CABG approximately 2004-[facility] (Cath - 17 Sep 2007:90% prox LAD with diffuse 85% narrowing to distal segment, 80% prox CXA, RCA: prox 90%, prox-mid 70%, mid 85%, distal 80%, D1 80%; Normal EF 65% --status post CABG--LIMA to LAD, SVG to PDA.     COPD (chronic obstructive pulmonary disease) (HCC)    Depression    Diabetes mellitus without complication (HCC)    Hypertension    Ischemic cardiomyopathy    Pleural effusion    S/P CABG x 2    CABG--LIMA to LAD, SVG to PDA.      Patient Active Problem List   Diagnosis Date Noted   NSTEMI (non-ST elevated myocardial infarction) (HCC) 10/09/2021   Atrial tachycardia (HCC) 10/09/2021   Hyperkalemia 10/09/2021   BPPV (benign paroxysmal positional vertigo) 03/17/2021   Microcytic anemia 02/22/2020   Generalized anxiety disorder 09/24/2018   Major depressive disorder, single episode, unspecified 09/24/2018   Abrasion of great toe, left 07/03/2018   Chronic systolic heart failure (HCC) 06/13/2018   Hyperlipidemia LDL goal <70 05/07/2018   Essential (primary) hypertension 05/07/2018   Ischemic cardiomyopathy 05/01/2018   Pleural effusion 04/29/2018   Demand ischemia (HCC) 04/29/2018   Prolonged QT interval 04/29/2018   Diabetes mellitus type 2 in nonobese  (HCC) 04/29/2018   Anxiety and depression 04/29/2018   CAD (coronary artery disease) 04/29/2018   Acute systolic CHF (congestive heart failure) (HCC) 04/28/2018   Normocytic anemia 04/21/2018   Multiple pulmonary nodules 12/25/2017   Callus of foot 10/23/2017   Metatarsalgia of both feet 10/23/2017   Hx of CABG 04/28/2017   Plantar fat pad atrophy of left foot 12/11/2016   Hallux rigidus of left foot 09/11/2016   Diabetic toe ulcer (HCC) 05/16/2016   Contracture of left Achilles tendon 01/18/2016   Hyperlipidemia LDL goal <100 05/12/2015   Iron deficiency anemia 07/07/2014   Major depressive disorder, recurrent episode (HCC) 07/07/2014   Microalbuminuria due to type 2 diabetes mellitus (HCC) 07/07/2014   Osteoarthritis 07/07/2014   Peripheral vascular disease (HCC) 07/07/2014   Polyneuropathy in diabetes (HCC) 07/07/2014   Preproliferative diabetic retinopathy (HCC) 07/07/2014   Testicular hypofunction 07/07/2014   Status post amputation of foot (HCC) 07/07/2014   Type 2 diabetes mellitus without complication (HCC) 07/07/2014   Esophageal reflux 07/06/2014   Fatty (change of) liver, not elsewhere classified 07/06/2014   History of colon polyps 07/06/2014   History of gastric bypass 07/06/2014   Insomnia 07/06/2014   Other acquired hammer toe 07/06/2014   Coronary atherosclerosis 07/06/2014   Essential hypertension 07/06/2014    Past Surgical History:  Procedure Laterality Date   APPENDECTOMY     CHOLECYSTECTOMY     CORONARY ARTERY BYPASS GRAFT     GASTRIC  BYPASS     LEFT HEART CATH AND CORS/GRAFTS ANGIOGRAPHY N/A 04/30/2018   Procedure: LEFT HEART CATH AND CORS/GRAFTS ANGIOGRAPHY;  Surgeon: Marykay Lex, MD;  Location: [facility] INVASIVE CV LAB;  Service: Cardiovascular;  Laterality: N/A;   RIGHT/LEFT HEART CATH AND CORONARY/GRAFT ANGIOGRAPHY N/A 02/18/2020   Procedure: RIGHT/LEFT HEART CATH AND CORONARY/GRAFT ANGIOGRAPHY;  Surgeon: Dolores Patty, MD;  Location: [facility]  INVASIVE CV LAB;  Service: Cardiovascular;  Laterality: N/A;   TOE AMPUTATION         Family History  Problem Relation Age of Onset   Hypertension Father     Social History   Tobacco Use   Smoking status: Never   Smokeless tobacco: Never  Vaping Use   Vaping Use: Never used  Substance Use Topics   Alcohol use: Yes    Alcohol/week: 2.0 standard drinks    Types: 2 Cans of beer per week    Comment: occ   Drug use: Never    Home Medications Prior to Admission medications   Medication Sig Start Date End Date Taking? Authorizing Provider  ALPRAZolam (XANAX) 0.25 MG tablet TAKE ONE-HALF TO ONE TABLET BY MOUTH ONE TIME DAILY AS NEEDED FOR ANXIETY Patient taking differently: Take 0.25 mg by mouth daily as needed for anxiety. 06/19/21  Yes Corie Chiquito, PMHNP  aspirin EC 81 MG tablet Take 81 mg by mouth at bedtime.    Yes [provider]  atorvastatin (LIPITOR) 20 MG tablet Take 1 tablet (20 mg total) by mouth daily. Please make an appointment for further refills Patient taking differently: Take 20 mg by mouth at bedtime. 05/15/21  Yes Bensimhon, Bevelyn Buckles, MD  buPROPion (WELLBUTRIN XL) 150 MG 24 hr tablet Take 1 tablet (150 mg total) by mouth daily. Patient taking differently: Take 150 mg by mouth in the morning and at bedtime. 07/17/21  Yes Corie Chiquito, PMHNP  carvedilol (COREG) 6.25 MG tablet Take 6.25 mg by mouth in the morning and at bedtime.   Yes [provider]  Cholecalciferol (VITAMIN D3) 5000 units CAPS Take 5,000 Units by mouth daily.  12/19/17  Yes [provider]  donepezil (ARICEPT) 5 MG tablet Take 1 tablet (5 mg total) by mouth daily. 07/17/21 10/15/21 Yes Corie Chiquito, PMHNP  DULoxetine (CYMBALTA) 60 MG capsule Take 1 capsule (60 mg total) by mouth daily. 07/17/21  Yes Corie Chiquito, PMHNP  empagliflozin (JARDIANCE) 10 MG TABS tablet Take 1 tablet (10 mg total) by mouth daily. Please make an appointment for refills Patient taking  differently: Take 10 mg by mouth daily. 06/14/21  Yes Bensimhon, Bevelyn Buckles, MD  finasteride (PROSCAR) 5 MG tablet Take 5 mg by mouth at bedtime. 04/20/18  Yes [provider]  folic acid (FOLVITE) 1 MG tablet Take 1 mg by mouth daily. 04/20/18  Yes [provider]  furosemide (LASIX) 20 MG tablet Take 2 tablets (40 mg total) by mouth daily. 09/11/21  Yes Clegg, Amy D, NP  glimepiride (AMARYL) 2 MG tablet Take 2 mg by mouth at bedtime. 04/20/18  Yes [provider]  metFORMIN (GLUCOPHAGE-XR) 500 MG 24 hr tablet Take 500 mg by mouth 2 (two) times daily. 04/20/18  Yes [provider]  minoxidil (LONITEN) 2.5 MG tablet Take 1.25 mg by mouth at bedtime. 09/03/21  Yes [provider]  nitroGLYCERIN (NITROSTAT) 0.4 MG SL tablet Place 1 tablet (0.4 mg total) under the tongue every 5 (five) minutes as needed for chest pain. 05/09/21 05/09/22 Yes Clegg, Amy D, NP  ofloxacin (OCUFLOX) 0.3 % ophthalmic solution Place 1 drop into both eyes daily as needed (Before procedure eye injections).   Yes [provider]  polyvinyl alcohol (LIQUIFILM TEARS) 1.4 % ophthalmic solution Place 1 drop into the left eye daily as needed for dry eyes.   Yes [provider]  zolpidem (AMBIEN CR) 12.5 MG CR tablet TAKE ONE TABLET BY MOUTH EVERY NIGHT AT BEDTIME AS NEEDED FOR SLEEP Patient taking differently: Take 12.5 mg by mouth at bedtime as needed for sleep. 09/20/21  Yes Corie Chiquito, PMHNP  doxycycline (VIBRA-TABS) 100 MG tablet Take 1 tablet (100 mg total) by mouth 2 (two) times daily for 7 days. Patient not taking: No sig reported 10/03/21 10/10/21  Sharlene Dory, DO    Allergies    Celecoxib, Losartan, and Ramipril  Review of Systems   Review of Systems  Constitutional:  Negative for appetite change.  HENT:  Negative for congestion.   Respiratory:  Positive for shortness of breath.   Cardiovascular:  Positive for palpitations.  Gastrointestinal:   Negative for abdominal pain.  Genitourinary:  Negative for flank pain.  Musculoskeletal:  Negative for back pain.  Skin:  Negative for rash.  Neurological:  Negative for weakness.  Psychiatric/Behavioral:  Negative for confusion.    Physical Exam Updated Vital Signs BP 91/65   Pulse 66   Temp 98.2 F (36.8 C) (Oral)   Resp 19   Ht 5\' 10"  (1.778 m)   Wt 82.4 kg   SpO2 91%   BMI 26.07 kg/m   Physical Exam Vitals and nursing note reviewed.  HENT:     Head: Atraumatic.  Eyes:     Pupils: Pupils are equal, round, and reactive to light.  Cardiovascular:     Rate and Rhythm: Regular rhythm.  Abdominal:     Tenderness: There is no abdominal tenderness.  Musculoskeletal:        General: No tenderness.     Cervical back: Neck supple.  Skin:    General: Skin is warm.     Capillary Refill: Capillary refill takes less than 2 seconds.  Neurological:     Mental Status: He is alert and oriented to person, place, and time.  Psychiatric:        Mood and Affect: Mood normal.    ED Results / Procedures / Treatments   Labs (all labs ordered are listed, but only abnormal results are displayed) Labs Reviewed  BASIC METABOLIC PANEL - Abnormal; Notable for the following components:      Result Value   Chloride 97 (*)    Glucose, Bld 201 (*)    BUN 38 (*)    All other components within normal limits  BASIC METABOLIC PANEL - Abnormal; Notable for the following components:   Glucose, Bld 171 (*)    BUN 37 (*)    All other components within normal limits  TROPONIN I (HIGH SENSITIVITY) - Abnormal; Notable for the following components:   Troponin I (High Sensitivity) 1,139 (*)    All other components within normal limits  TROPONIN I (HIGH SENSITIVITY) - Abnormal; Notable for the following components:   Troponin I (High Sensitivity) 1,219 (*)    All other components within normal limits  CBC  HEPARIN LEVEL (UNFRACTIONATED)  CBC    EKG EKG Interpretation  Date/Time:  Monday  October 09 2021 16:50:56 EDT Ventricular Rate:  82 PR Interval:  136 QRS Duration: 147 QT Interval:  402 QTC Calculation: 470 R Axis:   129  Text Interpretation: Sinus rhythm Supraventricular bigeminy Consider left ventricular hypertrophy Abnormal T, consider ischemia, lateral leads Confirmed by Benjiman Core [phone]) on 10/09/2021 5:03:29 PM  Radiology DG Chest 2 View  Result Date: 10/09/2021 CLINICAL DATA:  Palpitations EXAM: CHEST - 2 VIEW COMPARISON:  09/02/2018 FINDINGS: Transverse diameter of heart is increased. There is evidence of previous coronary bypass surgery. Dense calcification is seen in the region of mitral annulus. Lung fields are clear of any infiltrates or pulmonary edema. There is no pleural effusion or pneumothorax. IMPRESSION: There are no new infiltrates or signs of pulmonary edema. Electronically Signed   By: Ernie Avena M.D.   On: 10/09/2021 16:18    Procedures Procedures   Medications Ordered in ED Medications  heparin ADULT infusion 100 units/mL (25000 units/213mL) (1,000 Units/hr Intravenous New Bag/Given 10/09/21 1827)  sodium chloride flush (NS) 0.9 % injection 3 mL (3 mLs Intravenous Given 10/09/21 2104)  sodium chloride flush (NS) 0.9 % injection 3 mL (has no administration in time range)  0.9 %  sodium chloride infusion (has no administration in time range)  aspirin chewable tablet 81 mg (has no administration in time range)  0.9 %  sodium chloride infusion (has no administration in time range)  heparin bolus via infusion 4,000 Units (4,000 Units Intravenous Bolus from Bag 10/09/21 1829)    ED Course  I have reviewed the triage vital signs and the nursing notes.  Pertinent labs & imaging results that were available during my care of the patient were reviewed by me and considered in my medical decision making (see chart for details).    MDM Rules/Calculators/A&P                          Patient sent in from heart failure clinic with  tachycardia.  Resolved upon arrival.  Likely A. fib versus SVT.  However troponin is elevated at thousand.  Has had some chest pain.  Heart failure clinic will admit.  Final Clinical Impression(s) / ED Diagnoses Final diagnoses:  Tachycardia  NSTEMI (non-ST elevated myocardial infarction) [facility])    Rx / DC Orders ED Discharge Orders     None        Benjiman Core, MD 10/10/21 Salley Hews    Benjiman Core, MD 10/13/21 [phone]

## 2021-10-09 NOTE — Progress Notes (Addendum)
Patient arrived for CPX with weight 189.2 lb, BP 104/64 and HR 145, noted tachycardic upon initial EKG setup for exercise testing. NP, Amy Filbert Schilder was notified and CPX was cancelled. Patient was transitioned to an APP clinic visits and patient's wife was notified per his request.    Reggy Eye, MS, ACSM, [facility] Clinical Exercise Physiologist/ Health and Wellness Coach

## 2021-10-09 NOTE — Progress Notes (Addendum)
Advanced Heart Failure Clinic Note    Primary Care: Shelda Pal, DO Primary [facility] Cardiologist: Dr Atilano Median  HF Cardiologist: Dr. Haroldine Laws  HPI: Alec Taylor is a 75 y.o. male with history of CAD s/p CABG 2004 at [address] [facility], HTN, hyperlipidemia, DMII, obesity s/p gastric bypass and systolic HF.   Echocardiogram 12/2017  revealed LVEF of 45 to 50%.   Admitted 04/28/18 with increased dyspnea. CTA negative for PE. ECHO performed and showed reduced EF 35-40%. He underwent LHC . Stable coronary disease and grafts. Diuresed well. Discharge weight was 177 pounds.   Follow up with Dr. Haroldine Laws 07/28/21, stable NYHA II, volume mildly elevated. Instructed to double lasix x 2 days. Sleep study arranged.  Echo 07/28/21 EF 45-50% (read as 50-55%).  Home Sleep Study 08/18/2021- Mild OSA AHI 11.5, no central sleep apnea, oxygen desaturation < 30 minutes.   He was seen 08/21/21. K elevated 5.9 --> entresto was stopped and given dose of lokelma. Repeat BMET on 08/29/21 K 5.2.   On 10/03/21 had video visit with PCP. Treated for acute sinusitis. Placed on prednisone and doxcycline. Completed  course 10/08/21.   Today he presented for CPX test today and EKG showed Sinus Tach ? Atrial Tach 145 bpm. CPX was cancelled.   On Friday had chest pain radiating to left arm and back that lasted 3 hours. Says he took tylenol and felt better. No chest pain for the last 2 days. Complaining of dizziness and feeling spaced out. Denies PND/Orthopnea.  No BRBPR. Appetite ok. No fever or chills.  Taking all medications.   Cardiac studies: - Cath 3/21 1. Severe native 3v CAD with patent grafts to LAD and dRCA 2. EF 45-50% Ao = 115/51 (76) LV = 107/17 RA = 6 RV = 45/6 PA =  51/20 (33) PCW = 19 (v wave 28) Fick cardiac output/index = 8.0/4.0 PVR = 1.4 WU FA sat = 99% PA sat = 69%, 74% SVC sat = 72%  - Echo (8/22): EF 45-50% (read as 50-55%). - Echo (3/21):[name] LV dilated EF 40-45% RV ok. Personally reviewed -  Echo (1/20): EF 40-45%  cMRI (8/14):  1. Normal left ventricular size with mild concentric hypertrophy and severely decreased systolic function (LVEF = 38%) with diffuse hypokinesis.  There is pericardial late gadolinium enhancement in the basal and mid inferior walls.  2. Normal right ventricular size, thickness and borderline systolic function (LVEF = 45%). There are no regional wall motion abnormalities.  3.  Moderately dilated left atrium, mildly dilated right atrium.  4. Normal size of the aortic root, ascending aorta. Mildly dilated pulmonary artery measuring 31 mm.  5.  Mild aortic, mitral and trivial tricuspid regurgitation.  6.  Normal pericardium.  No pericardial effusion  - ECHO 04/29/2018  EF 35% to 40%. Diffuse hypokinesis, Mitral valve: Severely calcified annulus. There was mild regurgitation.  - LHC 04/30/2018  Prox RCA to Mid RCA lesion is 65% stenosed. Dist RCA-1 lesion is 85% stenosed. Dist RCA-2 lesion is 95% stenosed with 80% stenosed side branch in Post Atrio. SVG-rPDA graft was visualized by angiography and is normal in caliber and large. The graft exhibits no disease. Ost RPDA lesion is 45% stenosed. Ost RPDA to prox RPDA lesion is 90% stenosed - This limits flow retrograde to the RPAV-RPL. Ost LAD lesion is 55% stenosed. Ost 1st Diag (small caliber vessel) lesion is 70% stenosed. Prox LAD lesion is 100% stenosed. LIMA-LAD graft is large caliber, widely patent perfusing a small caliber distal LAD  Prox Cx to Mid Cx stent is 25% stenosed. Dist Cx lesion is 65% stenosed. Ost 3rd Mrg to 3rd Mrg lesion is 70% stenosed. -Both limbs of the distal bifurcation. There is severe left ventricular systolic dysfunction. The left ventricular ejection fraction is less than 25% by visual estimate. LV end diastolic pressure is moderately elevated. Significant aortic and mitral valve calcification. Calcified pericardium  Review of systems complete and found to be negative unless  listed in HPI.   Past Medical History:  Diagnosis Date   CAD (coronary artery disease) 09/2007   s/p CABG approximately 2004-[facility] (Cath - 17 Sep 2007:90% prox LAD with diffuse 85% narrowing to distal segment, 80% prox CXA, RCA: prox 90%, prox-mid 70%, mid 85%, distal 80%, D1 80%; Normal EF 65% --status post CABG--LIMA to LAD, SVG to PDA.     COPD (chronic obstructive pulmonary disease) (HCC)    Depression    Diabetes mellitus without complication (HCC)    Hypertension    Ischemic cardiomyopathy    Pleural effusion    S/P CABG x 2    CABG--LIMA to LAD, SVG to PDA.     Current Outpatient Medications  Medication Sig Dispense Refill   ALPRAZolam (XANAX) 0.25 MG tablet TAKE ONE-HALF TO ONE TABLET BY MOUTH ONE TIME DAILY AS NEEDED FOR ANXIETY 30 tablet 2   aspirin EC 81 MG tablet Take 81 mg by mouth at bedtime.      atorvastatin (LIPITOR) 20 MG tablet Take 1 tablet (20 mg total) by mouth daily. Please make an appointment for further refills 30 tablet 6   buPROPion (WELLBUTRIN XL) 150 MG 24 hr tablet Take 1 tablet (150 mg total) by mouth daily. 90 tablet 1   carvedilol (COREG) 6.25 MG tablet 6.25 mg in the morning and at bedtime.     Cholecalciferol (VITAMIN D3) 5000 units CAPS Take 5,000 Units by mouth daily.      donepezil (ARICEPT) 5 MG tablet Take 1 tablet (5 mg total) by mouth daily. 90 tablet 1   doxycycline (VIBRA-TABS) 100 MG tablet Take 1 tablet (100 mg total) by mouth 2 (two) times daily for 7 days. 14 tablet 0   DULoxetine (CYMBALTA) 60 MG capsule Take 1 capsule (60 mg total) by mouth daily. 90 capsule 0   empagliflozin (JARDIANCE) 10 MG TABS tablet Take 1 tablet (10 mg total) by mouth daily. Please make an appointment for refills 90 tablet 3   finasteride (PROSCAR) 5 MG tablet Take 5 mg by mouth daily.   6   folic acid (FOLVITE) 1 MG tablet Take 1 mg by mouth daily.  2   furosemide (LASIX) 20 MG tablet Take 2 tablets (40 mg total) by mouth daily. 60 tablet 4   glimepiride (AMARYL) 2  MG tablet Take 2 mg by mouth every evening.  1   metFORMIN (GLUCOPHAGE-XR) 500 MG 24 hr tablet Take 500 mg by mouth 2 (two) times daily.  1   nitroGLYCERIN (NITROSTAT) 0.4 MG SL tablet Place 1 tablet (0.4 mg total) under the tongue every 5 (five) minutes as needed for chest pain. 100 tablet 3   ofloxacin (OCUFLOX) 0.3 % ophthalmic solution Place 1 drop into both eyes as needed (Before procedure eye injections). Prior to procedures.     polyvinyl alcohol (LIQUIFILM TEARS) 1.4 % ophthalmic solution 1 drop as needed.     zolpidem (AMBIEN CR) 12.5 MG CR tablet TAKE ONE TABLET BY MOUTH EVERY NIGHT AT BEDTIME AS NEEDED FOR SLEEP 30 tablet 2  No current facility-administered medications for this encounter.   Allergies  Allergen Reactions   Celecoxib Other (See Comments)    hyperkalemia   Losartan Other (See Comments)    hyperkalemia   Ramipril Cough        Social History   Socioeconomic History   Marital status: Legally Separated    Spouse name: Not on file   Number of children: Not on file   Years of education: Not on file   Highest education level: Not on file  Occupational History   Not on file  Tobacco Use   Smoking status: Never   Smokeless tobacco: Never  Vaping Use   Vaping Use: Never used  Substance and Sexual Activity   Alcohol use: Yes    Alcohol/week: 2.0 standard drinks    Types: 2 Cans of beer per week    Comment: occ   Drug use: Never   Sexual activity: Not on file  Other Topics Concern   Not on file  Social History Narrative   Not on file   Social Determinants of Health   Financial Resource Strain: Not on file  Food Insecurity: Not on file  Transportation Needs: Not on file  Physical Activity: Not on file  Stress: Not on file  Social Connections: Not on file  Intimate Partner Violence: Not on file    Family History  Problem Relation Age of Onset   Hypertension Father    BP 90/66   Pulse (!) 149   Wt 82.4 kg (181 lb 9.6 oz)   SpO2 97%   BMI  26.06 kg/m  90/60  Wt Readings from Last 3 Encounters:  10/09/21 82.4 kg (181 lb 9.6 oz)  09/11/21 85.8 kg (189 lb 3.2 oz)  08/21/21 87.7 kg (193 lb 6.4 oz)    Reds CLip 31%.   PHYSICAL EXAM: General:  Walked in the clinic. No resp difficulty HEENT: normal Neck: supple. JVP flat. Carotids 2+ bilat; no bruits. No lymphadenopathy or thryomegaly appreciated. Cor: PMI nondisplaced.  Tachy Regular rate & rhythm. No rubs, gallops or murmurs. Lungs: clear Abdomen: soft, nontender, nondistended. No hepatosplenomegaly. No bruits or masses. Good bowel sounds. Extremities: no cyanosis, clubbing, rash, edema Neuro: alert & orientedx3, cranial nerves grossly intact. moves all 4 extremities w/o difficulty. Affect pleasant  EKG: Atrial Tach versus A flutter 145 bpm    ASSESSMENT & PLAN:  1. Chronic Systolic Heart Failure due to ICM - EF 1/19 45-50% - Echo 5/19 EF 35-40%. ?Viral versus PVC on top of iCM - cMRI 8/19 EF 38% pericardial late gadolinium enhancement in the basal and mid inferior walls. Suspicious for recent myocarditis. - Echo 3/21 EF 40-45% - Cath 3/21: Underwent R/L cath with stable anatomy and no targets for PCI. - Echo 07/28/21 EF 45-50% (read as 50-55%)  NYHA III. Volume status low. Reds Clip 31%.  - Hold lasix and jardiance.    - Off entresto and spiro due to hyperkalemia. Persistent hyperkalemia for the last year.  - Continue carvedilol 6.25 mg daily.  2. CAD S/P CABG  - LHC 04/30/2018 severe multivessel disease and patent LIMA-LAD SVG-PDA - Cath 3/21 stable anatomy. No PCI targets. - Had chest pain over the weekend. Check HS Trop.  - Continue statin, BB, and ASA.  3. Anemia - Secondary to gastric bypass surgery - Iron stores have been low.  - He continues to receive feraheme. Last dose was back July.  - He has recurrent iron deficiency. Followed by PCP and GI. -  Check CBC   4. DM2 - Per PCP  - Continue Jardiance 10 mg daily.  5. PVCs - No palpitations  currently.   6. Mild Sleep Apnea - Home sleep study desaturations at night an mild sleep apnea. He has a virtual call with Dr Radford Pax.   7. Tachycardia -Unable to break with carotid massage.  - Appears to be Atrial Tach versus A flutter.  - Sent to the ED.   8. URI  10/03/21 Recently treated with doxycyline and prednisone.   -Check TSH, MB, HS Trop, CBC, HS Trop now.   Send to ER   DR Bensimhon evaluated and recommends ER with possible cardioversion for A Tach versus A flutter RVR   Darrick Grinder, NP  2:16 PM

## 2021-10-09 NOTE — ED Triage Notes (Signed)
Pt sent from heart failure clinic for eval of elevated heart rate when he presented for a stress test. Pt reports feeling "spacey" but denies cp or shob.

## 2021-10-09 NOTE — Progress Notes (Signed)
EKG CRITICAL VALUE     12 lead EKG performed.  Critical value noted.  Amy Clegg, NP,notified.   Alto Denver, CCT 10/09/2021 1:52 PM

## 2021-10-09 NOTE — Progress Notes (Signed)
ReDS Vest / Clip - 10/09/21 1400       ReDS Vest / Clip   Station Marker C    Ruler Value 32.5    ReDS Value Range Low volume    ReDS Actual Value 31

## 2021-10-10 ENCOUNTER — Encounter (HOSPITAL_COMMUNITY): Payer: Self-pay

## 2021-10-10 ENCOUNTER — Encounter (HOSPITAL_COMMUNITY): Payer: Self-pay | Admitting: Internal Medicine

## 2021-10-10 ENCOUNTER — Other Ambulatory Visit (HOSPITAL_COMMUNITY): Payer: Self-pay | Admitting: Cardiology

## 2021-10-10 ENCOUNTER — Observation Stay (HOSPITAL_BASED_OUTPATIENT_CLINIC_OR_DEPARTMENT_OTHER): Payer: Medicare Other

## 2021-10-10 ENCOUNTER — Observation Stay (HOSPITAL_COMMUNITY)
Admit: 2021-10-10 | Discharge: 2021-10-10 | Disposition: A | Discharge status: Home / Self Care | Payer: Medicare Other | Attending: Student | Admitting: Student

## 2021-10-10 ENCOUNTER — Encounter (HOSPITAL_COMMUNITY)
Admission: EM | Disposition: A | Discharge status: Home / Self Care | Payer: Self-pay | Source: Home / Self Care | Attending: Emergency Medicine

## 2021-10-10 DIAGNOSIS — R079 Chest pain, unspecified: Secondary | ICD-10-CM

## 2021-10-10 DIAGNOSIS — I471 Supraventricular tachycardia: Secondary | ICD-10-CM

## 2021-10-10 DIAGNOSIS — I214 Non-ST elevation (NSTEMI) myocardial infarction: Secondary | ICD-10-CM | POA: Diagnosis not present

## 2021-10-10 DIAGNOSIS — I2510 Atherosclerotic heart disease of native coronary artery without angina pectoris: Principal | ICD-10-CM

## 2021-10-10 HISTORY — PX: LEFT HEART CATH AND CORS/GRAFTS ANGIOGRAPHY: CATH118250

## 2021-10-10 LAB — CBC
HCT: 39.3 % (ref 39.0–52.0)
HCT: 40.5 % (ref 39.0–52.0)
Hemoglobin: 12.4 g/dL — ABNORMAL LOW (ref 13.0–17.0)
Hemoglobin: 12.6 g/dL — ABNORMAL LOW (ref 13.0–17.0)
MCH: 28.4 pg (ref 26.0–34.0)
MCH: 27.9 pg (ref 26.0–34.0)
MCHC: 31.6 g/dL (ref 30.0–36.0)
MCHC: 31.1 g/dL (ref 30.0–36.0)
MCV: 90.1 fL (ref 80.0–100.0)
MCV: 89.8 fL (ref 80.0–100.0)
Platelets: 268 10*3/uL (ref 150–400)
Platelets: 291 10*3/uL (ref 150–400)
RBC: 4.36 MIL/uL (ref 4.22–5.81)
RBC: 4.51 MIL/uL (ref 4.22–5.81)
RDW: 13.1 % (ref 11.5–15.5)
RDW: 12.9 % (ref 11.5–15.5)
WBC: 8.9 10*3/uL (ref 4.0–10.5)
WBC: 7.9 10*3/uL (ref 4.0–10.5)
nRBC: 0 % (ref 0.0–0.2)
nRBC: 0 % (ref 0.0–0.2)

## 2021-10-10 LAB — BASIC METABOLIC PANEL
Anion gap: 9 (ref 5–15)
BUN: 29 mg/dL — ABNORMAL HIGH (ref 8–23)
CO2: 29 mmol/L (ref 22–32)
Calcium: 9 mg/dL (ref 8.9–10.3)
Chloride: 101 mmol/L (ref 98–111)
Creatinine, Ser: 1.02 mg/dL (ref 0.61–1.24)
GFR, Estimated: >60 mL/min (ref >60)
Glucose, Bld: 137 mg/dL — ABNORMAL HIGH (ref 70–99)
Potassium: 4.1 mmol/L (ref 3.5–5.1)
Sodium: 139 mmol/L (ref 135–145)

## 2021-10-10 LAB — ECHOCARDIOGRAM COMPLETE
AV Mean grad: 4 mmHg
AV Peak grad: 7.8 mmHg
Ao pk vel: 1.4 m/s
Area-P 1/2: 4.33 cm2
Calc EF: 33.8 %
Height: 70 in
S' Lateral: 4.4 cm
Single Plane A2C EF: 27 %
Single Plane A4C EF: 36.5 %
Weight: 2906.54 oz

## 2021-10-10 LAB — RESP PANEL BY RT-PCR (FLU A&B, COVID) ARPGX2
Influenza A by PCR: NEGATIVE
Influenza B by PCR: NEGATIVE
SARS Coronavirus 2 by RT PCR: NEGATIVE

## 2021-10-10 LAB — GLUCOSE, CAPILLARY: Glucose-Capillary: 137 mg/dL — ABNORMAL HIGH (ref 70–99)

## 2021-10-10 LAB — CARDIAC CATHETERIZATION: Cath EF Quantitative: 50 %

## 2021-10-10 LAB — HEPARIN LEVEL (UNFRACTIONATED)
Heparin Unfractionated: 0.1 IU/mL — ABNORMAL LOW (ref 0.30–0.70)
Heparin Unfractionated: 0.3 IU/mL (ref 0.30–0.70)

## 2021-10-10 SURGERY — LEFT HEART CATH AND CORS/GRAFTS ANGIOGRAPHY
Anesthesia: LOCAL

## 2021-10-10 MED ORDER — SODIUM CHLORIDE 0.9% FLUSH: 3.0000 mL | Freq: Two times a day (BID) | INTRAVENOUS | Status: DC

## 2021-10-10 MED ORDER — MIDAZOLAM HCL 2 MG/2ML IJ SOLN
INTRAMUSCULAR | Status: DC | PRN
  Administered 2021-10-10: 1 mg via INTRAVENOUS

## 2021-10-10 MED ORDER — ASPIRIN EC 81 MG PO TBEC: 81.0000 mg | DELAYED_RELEASE_TABLET | Freq: Every day | ORAL | Status: DC

## 2021-10-10 MED ORDER — ACETAMINOPHEN 325 MG PO TABS: 650.0000 mg | ORAL_TABLET | ORAL | Status: DC | PRN

## 2021-10-10 MED ORDER — VERAPAMIL HCL 2.5 MG/ML IV SOLN
INTRAVENOUS | Status: AC
  Filled 2021-10-10: qty 2

## 2021-10-10 MED ORDER — LIDOCAINE HCL (PF) 1 % IJ SOLN
INTRAMUSCULAR | Status: AC
  Filled 2021-10-10: qty 30

## 2021-10-10 MED ORDER — INFLUENZA VAC A&B SA ADJ QUAD 0.5 ML IM PRSY: 0.5000 mL | PREFILLED_SYRINGE | INTRAMUSCULAR | Status: DC

## 2021-10-10 MED ORDER — HEPARIN (PORCINE) IN NACL 1000-0.9 UT/500ML-% IV SOLN
INTRAVENOUS | Status: DC | PRN
  Administered 2021-10-10 (×2): 500 mL

## 2021-10-10 MED ORDER — HEPARIN SODIUM (PORCINE) 1000 UNIT/ML IJ SOLN
INTRAMUSCULAR | Status: DC | PRN
  Administered 2021-10-10: 4000 [IU] via INTRAVENOUS

## 2021-10-10 MED ORDER — PERFLUTREN LIPID MICROSPHERE
1.0000 mL | INTRAVENOUS | Status: AC | PRN
  Administered 2021-10-10: 2 mL via INTRAVENOUS
  Filled 2021-10-10: qty 10

## 2021-10-10 MED ORDER — LIDOCAINE HCL (PF) 1 % IJ SOLN
INTRAMUSCULAR | Status: DC | PRN
  Administered 2021-10-10: 2 mL

## 2021-10-10 MED ORDER — SODIUM CHLORIDE 0.9 % IV SOLN: 250.0000 mL | INTRAVENOUS | Status: DC | PRN

## 2021-10-10 MED ORDER — HEPARIN SODIUM (PORCINE) 1000 UNIT/ML IJ SOLN
INTRAMUSCULAR | Status: AC
  Filled 2021-10-10: qty 1

## 2021-10-10 MED ORDER — FINASTERIDE 5 MG PO TABS
5.0000 mg | ORAL_TABLET | Freq: Every day | ORAL | Status: DC
  Administered 2021-10-10: 5 mg via ORAL
  Filled 2021-10-10: qty 1

## 2021-10-10 MED ORDER — DULOXETINE HCL 60 MG PO CPEP
60.0000 mg | ORAL_CAPSULE | Freq: Every day | ORAL | Status: DC
  Administered 2021-10-10: 60 mg via ORAL
  Filled 2021-10-10: qty 1

## 2021-10-10 MED ORDER — METFORMIN HCL ER 500 MG PO TB24: 500.0000 mg | ORAL_TABLET | Freq: Two times a day (BID) | ORAL | Status: DC

## 2021-10-10 MED ORDER — METFORMIN HCL ER 500 MG PO TB24: 500.0000 mg | ORAL_TABLET | Freq: Two times a day (BID) | ORAL | 1 refills | Status: AC

## 2021-10-10 MED ORDER — IOHEXOL 350 MG/ML SOLN
INTRAVENOUS | Status: DC | PRN
  Administered 2021-10-10: 80 mL

## 2021-10-10 MED ORDER — SODIUM CHLORIDE 0.9 % IV SOLN: INTRAVENOUS | Status: AC

## 2021-10-10 MED ORDER — CARVEDILOL 12.5 MG PO TABS: 12.5000 mg | ORAL_TABLET | Freq: Two times a day (BID) | ORAL | 5 refills | Status: DC

## 2021-10-10 MED ORDER — ATORVASTATIN CALCIUM 10 MG PO TABS
20.0000 mg | ORAL_TABLET | Freq: Every day | ORAL | Status: DC
  Administered 2021-10-10: 20 mg via ORAL
  Filled 2021-10-10: qty 2

## 2021-10-10 MED ORDER — LABETALOL HCL 5 MG/ML IV SOLN: 10.0000 mg | INTRAVENOUS | Status: DC | PRN

## 2021-10-10 MED ORDER — DOXYCYCLINE HYCLATE 100 MG PO TABS: 100.0000 mg | ORAL_TABLET | Freq: Two times a day (BID) | ORAL | Status: DC

## 2021-10-10 MED ORDER — FENTANYL CITRATE (PF) 100 MCG/2ML IJ SOLN
INTRAMUSCULAR | Status: DC | PRN
  Administered 2021-10-10: 25 ug via INTRAVENOUS

## 2021-10-10 MED ORDER — BUPROPION HCL ER (XL) 150 MG PO TB24
150.0000 mg | ORAL_TABLET | Freq: Every day | ORAL | Status: DC
  Administered 2021-10-10: 150 mg via ORAL
  Filled 2021-10-10: qty 1

## 2021-10-10 MED ORDER — FENTANYL CITRATE (PF) 100 MCG/2ML IJ SOLN
INTRAMUSCULAR | Status: AC
  Filled 2021-10-10: qty 2

## 2021-10-10 MED ORDER — SODIUM CHLORIDE 0.9% FLUSH: 3.0000 mL | INTRAVENOUS | Status: DC | PRN

## 2021-10-10 MED ORDER — MIDAZOLAM HCL 2 MG/2ML IJ SOLN
INTRAMUSCULAR | Status: AC
  Filled 2021-10-10: qty 2

## 2021-10-10 MED ORDER — NITROGLYCERIN 0.4 MG SL SUBL: 0.4000 mg | SUBLINGUAL_TABLET | SUBLINGUAL | Status: DC | PRN

## 2021-10-10 MED ORDER — HEPARIN (PORCINE) IN NACL 1000-0.9 UT/500ML-% IV SOLN
INTRAVENOUS | Status: AC
  Filled 2021-10-10: qty 1000

## 2021-10-10 MED ORDER — HEPARIN BOLUS VIA INFUSION
2500.0000 [IU] | Freq: Once | INTRAVENOUS | Status: AC
  Administered 2021-10-10: 2500 [IU] via INTRAVENOUS
  Filled 2021-10-10: qty 2500

## 2021-10-10 MED ORDER — GLIMEPIRIDE 2 MG PO TABS
2.0000 mg | ORAL_TABLET | Freq: Every evening | ORAL | Status: DC
  Filled 2021-10-10: qty 1

## 2021-10-10 MED ORDER — HYDRALAZINE HCL 20 MG/ML IJ SOLN: 10.0000 mg | INTRAMUSCULAR | Status: DC | PRN

## 2021-10-10 MED ORDER — ALPRAZOLAM 0.25 MG PO TABS: 0.2500 mg | ORAL_TABLET | Freq: Three times a day (TID) | ORAL | Status: DC | PRN

## 2021-10-10 MED ORDER — CARVEDILOL 6.25 MG PO TABS
6.2500 mg | ORAL_TABLET | Freq: Two times a day (BID) | ORAL | Status: DC
  Administered 2021-10-10: 6.25 mg via ORAL
  Filled 2021-10-10: qty 1

## 2021-10-10 SURGICAL SUPPLY — 12 items
CATH INFINITI 5 FR JL3.5 (CATHETERS) ×2 IMPLANT
CATH INFINITI 5FR MULTPACK ANG (CATHETERS) ×2 IMPLANT
DEVICE RAD COMP TR BAND LRG (VASCULAR PRODUCTS) IMPLANT
DEVICE RAD TR BAND REGULAR (VASCULAR PRODUCTS) ×2 IMPLANT
GLIDESHEATH SLEND SS 6F .021 (SHEATH) ×2 IMPLANT
GUIDEWIRE INQWIRE 1.5J.035X260 (WIRE) ×1 IMPLANT
INQWIRE 1.5J .035X260CM (WIRE) ×2
KIT HEART LEFT (KITS) ×2 IMPLANT
PACK CARDIAC CATHETERIZATION (CUSTOM PROCEDURE TRAY) ×2 IMPLANT
TRANSDUCER W/STOPCOCK (MISCELLANEOUS) ×2 IMPLANT
TUBING CIL FLEX 10 FLL-RA (TUBING) ×2 IMPLANT
WIRE HI TORQ VERSACORE-J 145CM (WIRE) ×2 IMPLANT

## 2021-10-10 NOTE — Progress Notes (Unsigned)
Zio 14 day monitor ordered. Will apply to pt at discharge.

## 2021-10-10 NOTE — H&P (View-Only) (Signed)
Advanced Heart Failure Rounding Note  PCP-Cardiologist: [name], MD  [facility]: Dr. [name]   Subjective:    HS trop 1,100>>1,139>>1,219. On IV heparin. No further CP. Denies dyspnea   Rhythm stable overnight. Remains in NSR w/ PACs. HR 80s. SBPs 130s.   Echo in process. Awaiting LHC.   K 4.1    Objective:   Weight Range: 82.4 kg Body mass index is 26.07 kg/m.   Vital Signs:   Temp:  [98.2 F (36.8 C)-98.4 F (36.9 C)] 98.2 F (36.8 C) (10/31 1659) Pulse Rate:  [66-145] 80 (11/01 0930) Resp:  [14-24] 21 (11/01 0930) BP: (91-139)/(45-80) 118/64 (11/01 0930) SpO2:  [87 %-99 %] 92 % (11/01 0930) Weight:  [82.4 kg] 82.4 kg (10/31 1651)    Weight change: Filed Weights   10/09/21 1651  Weight: 82.4 kg    Intake/Output:   Intake/Output Summary (Last 24 hours) at 10/10/2021 1026 Last data filed at 10/10/2021 0720 Gross per 24 hour  Intake 197.96 ml  Output --  Net 197.96 ml      Physical Exam    General:  Well appearing. No resp difficulty HEENT: Normal Neck: Supple. JVP 7 cm. Carotids 2+ bilat; no bruits. No lymphadenopathy or thyromegaly appreciated. Cor: PMI nondisplaced. Regular rate & rhythm. No rubs, gallops or murmurs. Lungs: Clear Abdomen: Soft, nontender, nondistended. No hepatosplenomegaly. No bruits or masses. Good bowel sounds. Extremities: No cyanosis, clubbing, rash, edema Neuro: Alert & orientedx3, cranial nerves grossly intact. moves all 4 extremities w/o difficulty. Affect pleasant   Telemetry   NSR w/ PACs 80s, personally reviewed   EKG    No new EKG to review today   Labs    CBC Recent Labs    10/09/21 1609 10/10/21 0230  WBC 9.1 8.9  HGB 13.1 12.4*  HCT 42.5 39.3  MCV 91.0 90.1  PLT 299 268   Basic Metabolic Panel Recent Labs    35/57/32 1609 10/09/21 1745  NA 137 137  K 4.4 4.1  CL 97* 100  CO2 29 28  GLUCOSE 201* 171*  BUN 38* 37*  CREATININE 1.14 1.06  CALCIUM 9.0 8.9   Liver Function Tests No  results for input(s): AST, ALT, ALKPHOS, BILITOT, PROT, ALBUMIN in the last 72 hours. No results for input(s): LIPASE, AMYLASE in the last 72 hours. Cardiac Enzymes No results for input(s): CKTOTAL, CKMB, CKMBINDEX, TROPONINI in the last 72 hours.  BNP: BNP (last 3 results) Recent Labs    07/28/21 1146 08/21/21 1629 10/09/21 1352  BNP 425.6* 547.2* 580.7*    ProBNP (last 3 results) No results for input(s): PROBNP in the last 8760 hours.   D-Dimer No results for input(s): DDIMER in the last 72 hours. Hemoglobin A1C No results for input(s): HGBA1C in the last 72 hours. Fasting Lipid Panel No results for input(s): CHOL, HDL, LDLCALC, TRIG, CHOLHDL, LDLDIRECT in the last 72 hours. Thyroid Function Tests Recent Labs    10/09/21 1352  TSH 3.793    Other results:   Imaging    DG Chest 2 View  Result Date: 10/09/2021 CLINICAL DATA:  Palpitations EXAM: CHEST - 2 VIEW COMPARISON:  09/02/2018 FINDINGS: Transverse diameter of heart is increased. There is evidence of previous coronary bypass surgery. Dense calcification is seen in the region of mitral annulus. Lung fields are clear of any infiltrates or pulmonary edema. There is no pleural effusion or pneumothorax. IMPRESSION: There are no new infiltrates or signs of pulmonary edema. Electronically Signed   By: Rhae Hammock  Rathinasamy M.D.   On: 10/09/2021 16:18     Medications:     Scheduled Medications:  sodium chloride flush  3 mL Intravenous Q12H    Infusions:  sodium chloride     sodium chloride 10 mL/hr at 10/10/21 0617   heparin 1,250 Units/hr (10/10/21 0850)    PRN Medications: sodium chloride, perflutren lipid microspheres (DEFINITY) IV suspension, sodium chloride flush    Assessment/Plan   1. SVT - Atrial Tach vs AFL, initial rates 140s  - spontaneous conversion to NSR w/ PACs, HR now in 80s . Maintaining NSR. No recurrence overnight - TSH normal, CBC WNL - K 4.1 - Continue home Coreg, 6.25 mg bid  -  Plan LHC today to r/o ischemic cause  - Will need 14 day Zio at d/c    2. NSTEMI w/ H/o CAD - h/o CABG in 2004 in [place]  - LHC 04/30/2018 severe multivessel disease and patent LIMA-LAD SVG-PDA - Cath 3/21 stable anatomy. No PCI targets. - Had chest pain over the weekend. Currently CP free  - HS trop 1100>>1.139>>1,219. Suspect demand ischemia from tachycardia but need to r/o ACS - c/w IV heparin for now, plan repeat LHC today  - ASA + statin + ? blocker     3. Chronic Systolic Heart Failure  - Ischemic CM  - EF 1/19 45-50% - Echo 5/19 EF 35-40%. ?Viral versus PVC on top of iCM - cMRI 8/19 EF 38% pericardial late gadolinium enhancement in the basal and mid inferior walls. Suspicious for recent myocarditis. - Echo 3/21 EF 40-45% - Cath 3/21: Underwent R/L cath with stable anatomy and no targets for PCI. - Echo 07/28/21 EF 45-50% (read as 50-55%)  NYHA III. Volume status low. Reds Clip 31% on admit. CXR no edema - Hold lasix and jardiance.    - Off entresto and spiro due to hyperkalemia. Persistent hyperkalemia for the last year.  - Continue carvedilol 6.25 mg daily. - Repeat echo pending    Length of Stay: 0  Brittainy Simmons, PA-C  10/10/2021, 10:26 AM  Advanced Heart Failure Team Pager [phone] (M-F; 7a - 5p)  Please contact [facility] for night-coverage after hours (5p -7a ) and weekends on [email or web address]   Patient seen and examined with the above-signed Advanced Practice Provider and/or Housestaff. I personally reviewed laboratory data, imaging studies and relevant notes. I independently examined the patient and formulated the important aspects of the plan. I have edited the note to reflect any of my changes or salient points. I have personally discussed the plan with the patient and/or family.  Feels ok. Denies CP or SOB.  Rhythm stable. Hstrop flat at 1,100-1,200  Echo done this am. Reviewed personally. EF 50-55% mild lateral wall HK  General:  Well appearing. No resp  difficulty HEENT: normal Neck: supple. no JVD. Carotids 2+ bilat; no bruits. No lymphadenopathy or thryomegaly appreciated. Cor: PMI nondisplaced. Regular rate & rhythm. No rubs, gallops or murmurs. Lungs: clear Abdomen: soft, nontender, nondistended. No hepatosplenomegaly. No bruits or masses. Good bowel sounds. Extremities: no cyanosis, clubbing, rash, edema Neuro: alert & orientedx3, cranial nerves grossly intact. moves all 4 extremities w/o difficulty. Affect pleasant  Based on echo and flat troponins suspect most likely demand ischemia in setting of SVT (? AFL vs AVNRT). Will plan cath today. Likely will need outpatient monitor at d/c.   Glori Bickers, MD  10:54 AM

## 2021-10-10 NOTE — Progress Notes (Signed)
ANTICOAGULATION CONSULT NOTE  Pharmacy Consult for Heparin Indication: chest pain/ACS  Allergies  Allergen Reactions   Celecoxib Other (See Comments)    hyperkalemia   Losartan Other (See Comments)    hyperkalemia   Ramipril Cough         Patient Measurements: Height: 5\' 10"  (177.8 cm) Weight: 82.4 kg (181 lb 10.5 oz) IBW/kg (Calculated) : 73 Heparin Dosing Weight: 82.4 kg  Vital Signs: Temp: 98.2 F (36.8 C) (10/31 1659) Temp Source: Oral (10/31 1659) BP: 119/68 (11/01 0230) Pulse Rate: 88 (11/01 0230)  Labs: Recent Labs    10/09/21 1352 10/09/21 1609 10/09/21 1745 10/10/21 0230  HGB 14.0 13.1  --  12.4*  HCT 44.9 42.5  --  39.3  PLT 344 299  --  268  HEPARINUNFRC  --   --   --  0.10*  CREATININE 1.13 1.14 1.06  --   TROPONINIHS 1,100* 1,139* 1,219*  --      Estimated Creatinine Clearance: 64.1 mL/min (by C-G formula based on SCr of 1.06 mg/dL).   Assessment: 75 yo male admitted 10/09/2021 for NSTEMI. Pharmacy consulted to dose heparin. Patient is not on anticoagulation prior to admission.   Heparin level subtherapeutic (0.1) on gtt at 1000 units/hr. No issues with line or bleeding reported per RN.  Goal of Therapy:  Heparin level 0.3-0.7 units/ml Monitor platelets by anticoagulation protocol: Yes   Plan:  Rebolus heparin 2500 units IV Increase heparin to 1250 units/hr  Check 8 hr heparin level  10/11/2021, PharmD, BCPS Please see amion for complete clinical pharmacist phone list 10/10/2021 4:15 AM

## 2021-10-10 NOTE — Discharge Instructions (Addendum)
Hold Metformin for 2 days after your cardiac catheterization. May resume on 10/13/21

## 2021-10-10 NOTE — Interval H&P Note (Signed)
History and Physical Interval Note:  10/10/2021 3:58 PM  Alec Taylor  has presented today for surgery, with the diagnosis of nstemi.  The various methods of treatment have been discussed with the patient and family. After consideration of risks, benefits and other options for treatment, the patient has consented to  Procedure(s): LEFT HEART CATH AND CORS/GRAFTS ANGIOGRAPHY (N/A) and possible coronary angioplasty as a surgical intervention.  The patient's history has been reviewed, patient examined, no change in status, stable for surgery.  I have reviewed the patient's chart and labs.  Questions were answered to the patient's satisfaction.     [name]

## 2021-10-10 NOTE — ED Notes (Signed)
Patient SPO2 dropping to mid 80's while asleep, 2L [place] applied with improvement. Provider made aware

## 2021-10-10 NOTE — ED Notes (Signed)
Pt resting with eyes closed. SpO2 reading 87% on RA; pt removed [place] that previous RN applied. Reapplied [place] to pt and explained to pt the reason for applying oxygen.

## 2021-10-10 NOTE — Progress Notes (Addendum)
Advanced Heart Failure Rounding Note  PCP-Cardiologist: [name], MD  [facility]: Dr. [name]   Subjective:    HS trop 1,100>>1,139>>1,219. On IV heparin. No further CP. Denies dyspnea   Rhythm stable overnight. Remains in NSR w/ PACs. HR 80s. SBPs 130s.   Echo in process. Awaiting LHC.   K 4.1    Objective:   Weight Range: 82.4 kg Body mass index is 26.07 kg/m.   Vital Signs:   Temp:  [98.2 F (36.8 C)-98.4 F (36.9 C)] 98.2 F (36.8 C) (10/31 1659) Pulse Rate:  [66-145] 80 (11/01 0930) Resp:  [14-24] 21 (11/01 0930) BP: (91-139)/(45-80) 118/64 (11/01 0930) SpO2:  [87 %-99 %] 92 % (11/01 0930) Weight:  [82.4 kg] 82.4 kg (10/31 1651)    Weight change: Filed Weights   10/09/21 1651  Weight: 82.4 kg    Intake/Output:   Intake/Output Summary (Last 24 hours) at 10/10/2021 1026 Last data filed at 10/10/2021 0720 Gross per 24 hour  Intake 197.96 ml  Output --  Net 197.96 ml      Physical Exam    General:  Well appearing. No resp difficulty HEENT: Normal Neck: Supple. JVP 7 cm. Carotids 2+ bilat; no bruits. No lymphadenopathy or thyromegaly appreciated. Cor: PMI nondisplaced. Regular rate & rhythm. No rubs, gallops or murmurs. Lungs: Clear Abdomen: Soft, nontender, nondistended. No hepatosplenomegaly. No bruits or masses. Good bowel sounds. Extremities: No cyanosis, clubbing, rash, edema Neuro: Alert & orientedx3, cranial nerves grossly intact. moves all 4 extremities w/o difficulty. Affect pleasant   Telemetry   NSR w/ PACs 80s, personally reviewed   EKG    No new EKG to review today   Labs    CBC Recent Labs    10/09/21 1609 10/10/21 0230  WBC 9.1 8.9  HGB 13.1 12.4*  HCT 42.5 39.3  MCV 91.0 90.1  PLT 299 268   Basic Metabolic Panel Recent Labs    35/57/32 1609 10/09/21 1745  NA 137 137  K 4.4 4.1  CL 97* 100  CO2 29 28  GLUCOSE 201* 171*  BUN 38* 37*  CREATININE 1.14 1.06  CALCIUM 9.0 8.9   Liver Function Tests No  results for input(s): AST, ALT, ALKPHOS, BILITOT, PROT, ALBUMIN in the last 72 hours. No results for input(s): LIPASE, AMYLASE in the last 72 hours. Cardiac Enzymes No results for input(s): CKTOTAL, CKMB, CKMBINDEX, TROPONINI in the last 72 hours.  BNP: BNP (last 3 results) Recent Labs    07/28/21 1146 08/21/21 1629 10/09/21 1352  BNP 425.6* 547.2* 580.7*    ProBNP (last 3 results) No results for input(s): PROBNP in the last 8760 hours.   D-Dimer No results for input(s): DDIMER in the last 72 hours. Hemoglobin A1C No results for input(s): HGBA1C in the last 72 hours. Fasting Lipid Panel No results for input(s): CHOL, HDL, LDLCALC, TRIG, CHOLHDL, LDLDIRECT in the last 72 hours. Thyroid Function Tests Recent Labs    10/09/21 1352  TSH 3.793    Other results:   Imaging    DG Chest 2 View  Result Date: 10/09/2021 CLINICAL DATA:  Palpitations EXAM: CHEST - 2 VIEW COMPARISON:  09/02/2018 FINDINGS: Transverse diameter of heart is increased. There is evidence of previous coronary bypass surgery. Dense calcification is seen in the region of mitral annulus. Lung fields are clear of any infiltrates or pulmonary edema. There is no pleural effusion or pneumothorax. IMPRESSION: There are no new infiltrates or signs of pulmonary edema. Electronically Signed   By: Rhae Hammock  Rathinasamy M.D.   On: 10/09/2021 16:18     Medications:     Scheduled Medications:  sodium chloride flush  3 mL Intravenous Q12H    Infusions:  sodium chloride     sodium chloride 10 mL/hr at 10/10/21 0617   heparin 1,250 Units/hr (10/10/21 0850)    PRN Medications: sodium chloride, perflutren lipid microspheres (DEFINITY) IV suspension, sodium chloride flush    Assessment/Plan   1. SVT - Atrial Tach vs AFL, initial rates 140s  - spontaneous conversion to NSR w/ PACs, HR now in 80s . Maintaining NSR. No recurrence overnight - TSH normal, CBC WNL - K 4.1 - Continue home Coreg, 6.25 mg bid  -  Plan LHC today to r/o ischemic cause  - Will need 14 day Zio at d/c    2. NSTEMI w/ H/o CAD - h/o CABG in 2004 in [place]  - LHC 04/30/2018 severe multivessel disease and patent LIMA-LAD SVG-PDA - Cath 3/21 stable anatomy. No PCI targets. - Had chest pain over the weekend. Currently CP free  - HS trop 1100>>1.139>>1,219. Suspect demand ischemia from tachycardia but need to r/o ACS - c/w IV heparin for now, plan repeat LHC today  - ASA + statin + ? blocker     3. Chronic Systolic Heart Failure  - Ischemic CM  - EF 1/19 45-50% - Echo 5/19 EF 35-40%. ?Viral versus PVC on top of iCM - cMRI 8/19 EF 38% pericardial late gadolinium enhancement in the basal and mid inferior walls. Suspicious for recent myocarditis. - Echo 3/21 EF 40-45% - Cath 3/21: Underwent R/L cath with stable anatomy and no targets for PCI. - Echo 07/28/21 EF 45-50% (read as 50-55%)  NYHA III. Volume status low. Reds Clip 31% on admit. CXR no edema - Hold lasix and jardiance.    - Off entresto and spiro due to hyperkalemia. Persistent hyperkalemia for the last year.  - Continue carvedilol 6.25 mg daily. - Repeat echo pending    Length of Stay: 0  Brittainy Simmons, PA-C  10/10/2021, 10:26 AM  Advanced Heart Failure Team Pager [phone] (M-F; 7a - 5p)  Please contact [facility] for night-coverage after hours (5p -7a ) and weekends on [email or web address]   Patient seen and examined with the above-signed Advanced Practice Provider and/or Housestaff. I personally reviewed laboratory data, imaging studies and relevant notes. I independently examined the patient and formulated the important aspects of the plan. I have edited the note to reflect any of my changes or salient points. I have personally discussed the plan with the patient and/or family.  Feels ok. Denies CP or SOB.  Rhythm stable. Hstrop flat at 1,100-1,200  Echo done this am. Reviewed personally. EF 50-55% mild lateral wall HK  General:  Well appearing. No resp  difficulty HEENT: normal Neck: supple. no JVD. Carotids 2+ bilat; no bruits. No lymphadenopathy or thryomegaly appreciated. Cor: PMI nondisplaced. Regular rate & rhythm. No rubs, gallops or murmurs. Lungs: clear Abdomen: soft, nontender, nondistended. No hepatosplenomegaly. No bruits or masses. Good bowel sounds. Extremities: no cyanosis, clubbing, rash, edema Neuro: alert & orientedx3, cranial nerves grossly intact. moves all 4 extremities w/o difficulty. Affect pleasant  Based on echo and flat troponins suspect most likely demand ischemia in setting of SVT (? AFL vs AVNRT). Will plan cath today. Likely will need outpatient monitor at d/c.   Glori Bickers, MD  10:54 AM

## 2021-10-10 NOTE — ED Notes (Signed)
US at bedside

## 2021-10-10 NOTE — Discharge Summary (Signed)
Advanced Heart Failure Team  Discharge Summary   Patient ID: Alec Taylor MRN: [id number], DOB/AGE: 1946/04/23 75 y.o. Admit date: 10/09/2021 D/C date:     10/10/2021   Primary Discharge Diagnoses:  NSTEMI/ Demand Ischemia  SVT CAD  Hospital Course:   Alec Taylor is a 75 y.o. male with history of CAD s/p CABG 2004 at [facility], HTN, hyperlipidemia, DMII, obesity s/p gastric bypass and systolic HF.    Echocardiogram 12/2017  revealed LVEF of 45 to 50%.    Admitted 04/28/18 with increased dyspnea. CTA negative for PE. ECHO performed and showed reduced EF 35-40%. He underwent LHC . Stable coronary disease and grafts. Diuresed well. Discharge weight was 177 pounds.    Echo 07/28/21 EF 45-50% (read as 50-55%).   Home Sleep Study 08/18/2021- Mild OSA AHI 11.5, no central sleep apnea, oxygen desaturation < 30 minutes.    9/22,  Entresto stopped due to hyperkalemia   On 10/03/21 had video visit with PCP. Treated for acute sinusitis. Placed on prednisone and doxcycline. Completed course 10/08/21.    On Friday of last week, he had chest pain radiating to left arm and back. Says he took tylenol and felt better w/o further pain but complaining of dizziness and feeling spaced out.    Presented for CPX test on 10/31 and EKG showed SVT 145 bpm. Unable to break with carotid massage. SBP 90s. ReDs Clip 31%. CPX was cancelled. Referred to ED for further evaluation and found to have elevated Hs trop 1,100. SCr 1.13. K 4.4. CBC normal,  WBC 10.2, Hgb 14. CXR showed no new infiltrates or signs of pulmonary edema. TSH normal.   While in the ED, he spontaneously converted to NSR w/ PAC, HR 80s. Placed on IV heparin for elevated troponin and admitted for observation and LHC.   HS trop 1,100>>1,139>>1,219. Rhythm remained stable overnight in NSR w/ PACs. Echo showed normal LVEF, 55%, normal RV and mild distal lateral, distal anterior hypokinesis. No significant change from previous echo from Aug 2022.  LHC  showed overall stable coronary anatomy. Does have high-grade lesion in dLCX which may be source of ischemia but this is unchanged from previous. Will treat medically for now. Coreg increased to 12.5 mg bid to help w/ rate control. Continued on ASA and statin.   14 day zio arranged and placed prior to d/c.   Last seen and examined by Dr. [name] and felt stable for d/c home. Post hospital f/u arranged in the [facility].   Cardiac Studies  2D Echo 10/10/21: LVEF, 55%, normal RV and mild distal lateral, distal anterior hypokinesis. No significant change from previous  [facility] 10/10/21      Dist LM to Ost LAD lesion is 75% stenosed.   Prox LAD lesion is 100% stenosed.   Prox Cx to Mid Cx lesion is 25% stenosed.   Prox RCA to Mid RCA lesion is 65% stenosed.   Dist RCA-1 lesion is 85% stenosed.   Dist RCA-2 lesion is 95% stenosed with 80% stenosed side branch in RPAV.   Ost 1st Diag lesion is 70% stenosed.   Ost RPDA lesion is 45% stenosed.   Ost RPDA to RPDA lesion is 90% stenosed.   Ost 3rd Mrg to 3rd Mrg lesion is 100% stenosed.   Dist Cx lesion is 95% stenosed.   SVG and is normal in caliber and large.   LIMA and is large.   The graft exhibits no disease.   The graft exhibits no disease.  Overall stable coronary anatomy. Does have high-grade lesion in dLCX which may be source of ischemia but this is unchanged from previous. Will treat medically for now. Increase b-blocker.     Discharge Weight Range: 176 lb  Discharge Vitals: Blood pressure 119/62, pulse 81, temperature 98.3 F (36.8 C), temperature source Oral, resp. rate (!) 22, height 5\' 10"  (1.778 m), weight 80 kg, SpO2 97 %.  Labs: Lab Results  Component Value Date   WBC 7.9 10/10/2021   HGB 12.6 (L) 10/10/2021   HCT 40.5 10/10/2021   MCV 89.8 10/10/2021   PLT 291 10/10/2021    Recent Labs  Lab 10/10/21 1309  NA 139  K 4.1  CL 101  CO2 29  BUN 29*  CREATININE 1.02  CALCIUM 9.0  GLUCOSE 137*   Lab Results   Component Value Date   CHOL 117 07/14/2019   HDL 39.20 07/14/2019   LDLCALC 50 07/14/2019   TRIG 137.0 07/14/2019   BNP (last 3 results) Recent Labs    07/28/21 1146 08/21/21 1629 10/09/21 1352  BNP 425.6* 547.2* 580.7*    ProBNP (last 3 results) No results for input(s): PROBNP in the last 8760 hours.   Diagnostic Studies/Procedures   DG Chest 2 View  Result Date: 10/09/2021 CLINICAL DATA:  Palpitations EXAM: CHEST - 2 VIEW COMPARISON:  09/02/2018 FINDINGS: Transverse diameter of heart is increased. There is evidence of previous coronary bypass surgery. Dense calcification is seen in the region of mitral annulus. Lung fields are clear of any infiltrates or pulmonary edema. There is no pleural effusion or pneumothorax. IMPRESSION: There are no new infiltrates or signs of pulmonary edema. Electronically Signed   By: Ernie Avena M.D.   On: 10/09/2021 16:18   CARDIAC CATHETERIZATION  Result Date: 10/10/2021   Dist LM to Ost LAD lesion is 75% stenosed.   Prox LAD lesion is 100% stenosed.   Prox Cx to Mid Cx lesion is 25% stenosed.   Prox RCA to Mid RCA lesion is 65% stenosed.   Dist RCA-1 lesion is 85% stenosed.   Dist RCA-2 lesion is 95% stenosed with 80% stenosed side branch in RPAV.   Ost 1st Diag lesion is 70% stenosed.   Ost RPDA lesion is 45% stenosed.   Ost RPDA to RPDA lesion is 90% stenosed.   Ost 3rd Mrg to 3rd Mrg lesion is 100% stenosed.   Dist Cx lesion is 95% stenosed.   SVG and is normal in caliber and large.   LIMA and is large.   The graft exhibits no disease.   The graft exhibits no disease. Overall stable coronary anatomy. Does have high-grade lesion in dLCX which may be source of ischemia but this is unchanged from previous. Will treat medically for now. Increase b-blocker. Arvilla Meres, MD 4:56 PM  ECHOCARDIOGRAM COMPLETE  Result Date: 10/10/2021    ECHOCARDIOGRAM REPORT   Patient Name:   Alec Taylor Date of Exam: 10/10/2021 Medical Rec #:  [id number]      Height:       70.0 in Accession #:    [id number]    Weight:       181.7 lb Date of Birth:  05/19/1946      BSA:          2.004 m Patient Age:    75 years      BP:           128/75 mmHg Patient Gender: M  HR:           93 bpm. Exam Location:  Inpatient Procedure: 2D Echo, Cardiac Doppler and Color Doppler Indications:    R07.9* Chest pain, unspecified  History:        Patient has prior history of Echocardiogram examinations, most                 recent 07/28/2021. CAD and Previous Myocardial Infarction, Prior                 CABG; Arrythmias:Tachycardia.  Sonographer:    Vanetta Shawl Referring Phys: 97 Lashunda Greis M Jamonta Goerner IMPRESSIONS  1. Apical windows are foreshortened making accurate assessment of LVEF difficult. Mild distal lateral, distal anterior hypokinesis. No significant change from previous echo from Aug 2022. Left ventricular ejection fraction, by estimation, is 55%%. The left ventricle has normal function. There is mild left ventricular hypertrophy. Indeterminate diastolic filling due to E-A fusion. Elevated left atrial pressure.  2. Right ventricular systolic function is low normal. The right ventricular size is normal.  3. Left atrial size was mildly dilated.  4. The mitral valve is normal in structure. Mild mitral valve regurgitation. Moderate mitral annular calcification.  5. The aortic valve is tricuspid. Aortic valve regurgitation is not visualized. Mild aortic valve sclerosis is present, with no evidence of aortic valve stenosis.  6. The inferior vena cava is normal in size with greater than 50% respiratory variability, suggesting right atrial pressure of 3 mmHg. FINDINGS  Left Ventricle: Apical windows are foreshortened making accurate assessment of LVEF difficult. Mild distal lateral, distal anterior hypokinesis. No significant change from previous echo from Aug 2022. Left ventricular ejection fraction, by estimation, is 55%%. The left ventricle has normal function. The left  ventricular internal cavity size was normal in size. There is mild left ventricular hypertrophy. Indeterminate diastolic filling due to E-A fusion. Elevated left atrial pressure. Right Ventricle: The right ventricular size is normal. Right vetricular wall thickness was not assessed. Right ventricular systolic function is low normal. Left Atrium: Left atrial size was mildly dilated. Right Atrium: Right atrial size was normal in size. Pericardium: There is no evidence of pericardial effusion. Mitral Valve: The mitral valve is normal in structure. Moderate mitral annular calcification. Mild mitral valve regurgitation. MV peak gradient, 6.8 mmHg. The mean mitral valve gradient is 2.5 mmHg. Tricuspid Valve: The tricuspid valve is normal in structure. Tricuspid valve regurgitation is trivial. Aortic Valve: The aortic valve is tricuspid. Aortic valve regurgitation is not visualized. Mild aortic valve sclerosis is present, with no evidence of aortic valve stenosis. Aortic valve mean gradient measures 4.0 mmHg. Aortic valve peak gradient measures 7.8 mmHg. Pulmonic Valve: The pulmonic valve was normal in structure. Pulmonic valve regurgitation is trivial. Aorta: The aortic root and ascending aorta are structurally normal, with no evidence of dilitation. Venous: The inferior vena cava is normal in size with greater than 50% respiratory variability, suggesting right atrial pressure of 3 mmHg. IAS/Shunts: No atrial level shunt detected by color flow Doppler.  LEFT VENTRICLE PLAX 2D LVIDd:         5.00 cm      Diastology LVIDs:         4.40 cm      LV e' medial:    3.37 cm/s LV PW:         1.30 cm      LV E/e' medial:  44.2 LV IVS:        1.30 cm  LV e' lateral:   6.05 cm/s                             LV E/e' lateral: 24.6  LV Volumes (MOD) LV vol d, MOD A2C: 196.0 ml LV vol d, MOD A4C: 176.5 ml LV vol s, MOD A2C: 143.0 ml LV vol s, MOD A4C: 112.0 ml LV SV MOD A2C:     53.0 ml LV SV MOD A4C:     176.5 ml LV SV MOD BP:       64.3 ml RIGHT VENTRICLE            IVC RV Basal diam:  3.80 cm    IVC diam: 1.50 cm RV S prime:     5.77 cm/s LEFT ATRIUM             Index        RIGHT ATRIUM           Index LA diam:        5.70 cm 2.84 cm/m   RA Area:     17.60 cm LA Vol (A2C):   78.2 ml 39.03 ml/m  RA Volume:   43.80 ml  21.86 ml/m LA Vol (A4C):   70.1 ml 34.99 ml/m LA Biplane Vol: 75.3 ml 37.58 ml/m  AORTIC VALVE                   PULMONIC VALVE AV Vmax:           140.00 cm/s PV Vmax:       0.80 m/s AV Vmean:          99.900 cm/s PV Peak grad:  2.6 mmHg AV VTI:            0.288 m AV Peak Grad:      7.8 mmHg AV Mean Grad:      4.0 mmHg LVOT Vmax:         90.20 cm/s LVOT Vmean:        60.300 cm/s LVOT VTI:          0.165 m LVOT/AV VTI ratio: 0.57  AORTA Ao Root diam: 3.35 cm Ao Asc diam:  3.10 cm MITRAL VALVE                TRICUSPID VALVE MV Area (PHT): 4.33 cm     TR Peak grad:   40.4 mmHg MV Peak grad:  6.8 mmHg     TR Vmax:        318.00 cm/s MV Mean grad:  2.5 mmHg MV Vmax:       1.30 m/s     SHUNTS MV Vmean:      72.5 cm/s    Systemic VTI: 0.16 m MV Decel Time: 175 msec MV E velocity: 149.00 cm/s Dietrich Pates MD Electronically signed by Dietrich Pates MD Signature Date/Time: 10/10/2021/1:49:49 PM    Final     Discharge Medications   Allergies as of 10/10/2021       Reactions   Celecoxib Other (See Comments)   hyperkalemia   Losartan Other (See Comments)   hyperkalemia   Ramipril Cough           Medication List     STOP taking these medications    doxycycline 100 MG tablet Commonly known as: VIBRA-TABS       TAKE these medications    ALPRAZolam 0.25 MG tablet Commonly known as: XANAX TAKE ONE-HALF  TO ONE TABLET BY MOUTH ONE TIME DAILY AS NEEDED FOR ANXIETY What changed:  how much to take how to take this when to take this reasons to take this additional instructions   aspirin EC 81 MG tablet Take 81 mg by mouth at bedtime.   atorvastatin 20 MG tablet Commonly known as: LIPITOR Take 1 tablet (20 mg  total) by mouth daily. Please make an appointment for further refills What changed:  when to take this additional instructions   buPROPion 150 MG 24 hr tablet Commonly known as: WELLBUTRIN XL Take 1 tablet (150 mg total) by mouth daily. What changed: when to take this   carvedilol 12.5 MG tablet Commonly known as: COREG Take 1 tablet (12.5 mg total) by mouth 2 (two) times daily with a meal. What changed:  medication strength how much to take when to take this   donepezil 5 MG tablet Commonly known as: ARICEPT Take 1 tablet (5 mg total) by mouth daily.   DULoxetine 60 MG capsule Commonly known as: CYMBALTA Take 1 capsule (60 mg total) by mouth daily.   empagliflozin 10 MG Tabs tablet Commonly known as: Jardiance Take 1 tablet (10 mg total) by mouth daily. Please make an appointment for refills What changed: additional instructions   finasteride 5 MG tablet Commonly known as: PROSCAR Take 5 mg by mouth at bedtime.   folic acid 1 MG tablet Commonly known as: FOLVITE Take 1 mg by mouth daily.   furosemide 20 MG tablet Commonly known as: LASIX Take 2 tablets (40 mg total) by mouth daily.   glimepiride 2 MG tablet Commonly known as: AMARYL Take 2 mg by mouth at bedtime.   metFORMIN 500 MG 24 hr tablet Commonly known as: GLUCOPHAGE-XR Take 1 tablet (500 mg total) by mouth 2 (two) times daily. Start taking on: October 13, 2021 What changed: These instructions start on October 13, 2021. If you are unsure what to do until then, ask your doctor or other care provider.   minoxidil 2.5 MG tablet Commonly known as: LONITEN Take 1.25 mg by mouth at bedtime.   nitroGLYCERIN 0.4 MG SL tablet Commonly known as: Nitrostat Place 1 tablet (0.4 mg total) under the tongue every 5 (five) minutes as needed for chest pain.   ofloxacin 0.3 % ophthalmic solution Commonly known as: OCUFLOX Place 1 drop into both eyes daily as needed (Before procedure eye injections).   polyvinyl  alcohol 1.4 % ophthalmic solution Commonly known as: LIQUIFILM TEARS Place 1 drop into the left eye daily as needed for dry eyes.   Vitamin D3 125 MCG (5000 UT) Caps Take 5,000 Units by mouth daily.   zolpidem 12.5 MG CR tablet Commonly known as: AMBIEN CR TAKE ONE TABLET BY MOUTH EVERY NIGHT AT BEDTIME AS NEEDED FOR SLEEP What changed:  how much to take how to take this when to take this reasons to take this additional instructions        Disposition   The patient will be discharged in stable condition to home.   Follow-up Information     Bensimhon, Bevelyn Buckles, MD Follow up.   Specialty: Cardiology Why: 11/29 at 3:20 PM  [facility] at [facility] information: [address] [place] [address] [phone]                   Duration of Discharge Encounter: Greater than 35 minutes   Signed, Knute Neu  10/10/2021, 5:00 PM

## 2021-10-10 NOTE — Progress Notes (Signed)
ANTICOAGULATION CONSULT NOTE  Pharmacy Consult for Heparin Indication: chest pain/ACS  Allergies  Allergen Reactions   Celecoxib Other (See Comments)    hyperkalemia   Losartan Other (See Comments)    hyperkalemia   Ramipril Cough         Patient Measurements: Height: 5\' 10"  (177.8 cm) Weight: 80 kg (176 lb 6.4 oz) IBW/kg (Calculated) : 75 Heparin Dosing Weight: 82.4 kg  Vital Signs: Temp: 98.3 F (36.8 C) (11/01 1223) Temp Source: Oral (11/01 1223) BP: 138/69 (11/01 1223) Pulse Rate: 84 (11/01 1223)  Labs: Recent Labs    10/09/21 1352 10/09/21 1609 10/09/21 1745 10/10/21 0230 10/10/21 1309  HGB 14.0 13.1  --  12.4* 12.6*  HCT 44.9 42.5  --  39.3 40.5  PLT 344 299  --  268 291  HEPARINUNFRC  --   --   --  0.10* 0.30  CREATININE 1.13 1.14 1.06  --   --   TROPONINIHS 1,100* 1,139* 1,219*  --   --      Estimated Creatinine Clearance: 64.1 mL/min (by C-G formula based on SCr of 1.06 mg/dL).   Assessment: 75 yo male admitted 10/09/2021 for NSTEMI. Pharmacy consulted to dose heparin. Patient is not on anticoagulation prior to admission.   Heparin level 0.3 therapeutic after rebolus and rate increase to 1250 units/hr. No issues with line or bleeding reported per RN.  Goal of Therapy:  Heparin level 0.3-0.7 units/ml Monitor platelets by anticoagulation protocol: Yes   Plan:  Continue heparin 1250 units/hr Monitor daily CBC, heparin level, s/sx bleeding F/u plans after [facility] 11/1  13/1, PharmD PGY1 Pharmacy Resident 10/10/2021  1:41 PM  Please check [email or web address] for unit-specific pharmacy phone numbers.

## 2021-10-11 ENCOUNTER — Encounter (HOSPITAL_COMMUNITY): Payer: Self-pay | Admitting: Internal Medicine

## 2021-10-13 ENCOUNTER — Encounter: Payer: Self-pay | Admitting: Family Medicine

## 2021-10-13 ENCOUNTER — Other Ambulatory Visit: Payer: Self-pay

## 2021-10-13 ENCOUNTER — Ambulatory Visit (INDEPENDENT_AMBULATORY_CARE_PROVIDER_SITE_OTHER): Payer: Medicare Other | Admitting: Family Medicine

## 2021-10-13 ENCOUNTER — Telehealth: Payer: Self-pay

## 2021-10-13 VITALS — BP 110/62 | HR 65 | Temp 98.1°F | Resp 16 | Wt 181.0 lb

## 2021-10-13 DIAGNOSIS — J449 Chronic obstructive pulmonary disease, unspecified: Secondary | ICD-10-CM

## 2021-10-13 DIAGNOSIS — I214 Non-ST elevation (NSTEMI) myocardial infarction: Secondary | ICD-10-CM | POA: Diagnosis not present

## 2021-10-13 DIAGNOSIS — R052 Subacute cough: Secondary | ICD-10-CM | POA: Diagnosis not present

## 2021-10-13 MED ORDER — FLUTICASONE-SALMETEROL 250-50 MCG/ACT IN AEPB: 1.0000 | INHALATION_SPRAY | Freq: Two times a day (BID) | RESPIRATORY_TRACT | 0 refills | Status: DC

## 2021-10-13 MED ORDER — ALBUTEROL SULFATE HFA 108 (90 BASE) MCG/ACT IN AERS: 1.0000 | INHALATION_SPRAY | Freq: Four times a day (QID) | RESPIRATORY_TRACT | 0 refills | Status: DC | PRN

## 2021-10-13 NOTE — Telephone Encounter (Signed)
Transition Care Management Follow-up Telephone Call Date of discharge and from where: Alec Taylor 10/10/2021 How have you been since you were released from the hospital? " Dong okay" Any questions or concerns? No  Items Reviewed: Did the pt receive and understand the discharge instructions provided? Yes  Medications obtained and verified? Yes  Other? No  Any new allergies since your discharge? No  Dietary orders reviewed? Yes Do you have support at home? Yes   Home Care and Equipment/Supplies: Were home health services ordered? no If so, what is the name of the agency?  Has the agency set up a time to come to the patient's home? not applicable Were any new equipment or medical supplies ordered?   What is the name of the medical supply agency?  Were you able to get the supplies/equipment? not applicable Do you have any questions related to the use of the equipment or supplies? No  Functional Questionnaire: (I = Independent and D = Dependent) ADLs: I  Bathing/Dressing- I  Meal Prep- I  Eating- I  Maintaining continence- I  Transferring/Ambulation- I  Managing Meds- I  Follow up appointments reviewed:  PCP Hospital f/u appt confirmed? Yes  Scheduled to see pcp  on TODAY  Specialist Hospital f/u appt confirmed? Yes  Scheduled to see Cardiology on 11/29 Are transportation arrangements needed? No  If their condition worsens, is the pt aware to call PCP or go to the Emergency Dept.? Yes Was the patient provided with contact information for the PCP's office or ED? Yes Was to pt encouraged to call back with questions or concerns? Yes  Rowe Pavy, RN, BSN, CEN [facility] [employer] [phone]

## 2021-10-13 NOTE — Patient Instructions (Addendum)
Send me a message if anything changes please.   Use Mucinex 600 mg twice daily. Air humidifiers can be helpful as well.   Rinse your mouth out after using the inhaler.   Let us know if you need anything.

## 2021-10-13 NOTE — Progress Notes (Signed)
Chief Complaint  Patient presents with   Follow-up    Hosp f/u   Cough    Alec Taylor is 75 y.o. and is here for a cough.  Duration: 1  mo Productive? Yes Associated symptoms: nasal congestion, dyspnea, wheezing Denies: fever, rhinorrhea, sore throat, facial pain, hemoptysis Hx of GERD? No ACEi? No He did have a chest x-ray on 10/31 was unremarkable. He finished a course of prednisone and doxycycline that was not helpful. He was discharged on 11/1 after sustaining a heart attack.  Past Medical History:  Diagnosis Date   CAD (coronary artery disease) 09/2007   s/p CABG approximately 2004-[facility] (Cath - 17 Sep 2007:90% prox LAD with diffuse 85% narrowing to distal segment, 80% prox CXA, RCA: prox 90%, prox-mid 70%, mid 85%, distal 80%, D1 80%; Normal EF 65% --status post CABG--LIMA to LAD, SVG to PDA.     COPD (chronic obstructive pulmonary disease) (HCC)    Depression    Diabetes mellitus without complication (HCC)    Hypertension    Ischemic cardiomyopathy    Pleural effusion    S/P CABG x 2    CABG--LIMA to LAD, SVG to PDA.     Family History  Problem Relation Age of Onset   Hypertension Father    Allergies as of 10/13/2021       Reactions   Celecoxib Other (See Comments)   hyperkalemia   Losartan Other (See Comments)   hyperkalemia   Ramipril Cough           Medication List        Accurate as of October 13, 2021  5:08 PM. If you have any questions, ask your nurse or doctor.          albuterol 108 (90 Base) MCG/ACT inhaler Commonly known as: VENTOLIN HFA Inhale 1-2 puffs into the lungs every 6 (six) hours as needed for wheezing or shortness of breath. Started by: Sharlene Dory, DO   ALPRAZolam 0.25 MG tablet Commonly known as: XANAX TAKE ONE-HALF TO ONE TABLET BY MOUTH ONE TIME DAILY AS NEEDED FOR ANXIETY What changed:  how much to take how to take this when to take this reasons to take this additional instructions   aspirin EC 81 MG  tablet Take 81 mg by mouth at bedtime.   atorvastatin 20 MG tablet Commonly known as: LIPITOR Take 1 tablet (20 mg total) by mouth daily. Please make an appointment for further refills What changed:  when to take this additional instructions   buPROPion 150 MG 24 hr tablet Commonly known as: WELLBUTRIN XL Take 1 tablet (150 mg total) by mouth daily. What changed: when to take this   carvedilol 12.5 MG tablet Commonly known as: COREG Take 1 tablet (12.5 mg total) by mouth 2 (two) times daily with a meal.   donepezil 5 MG tablet Commonly known as: ARICEPT Take 1 tablet (5 mg total) by mouth daily.   DULoxetine 60 MG capsule Commonly known as: CYMBALTA Take 1 capsule (60 mg total) by mouth daily.   empagliflozin 10 MG Tabs tablet Commonly known as: Jardiance Take 1 tablet (10 mg total) by mouth daily. Please make an appointment for refills What changed: additional instructions   finasteride 5 MG tablet Commonly known as: PROSCAR Take 5 mg by mouth at bedtime.   fluticasone-salmeterol 250-50 MCG/ACT Aepb Commonly known as: Advair Diskus Inhale 1 puff into the lungs in the morning and at bedtime. Started by: Sharlene Dory, DO   folic  acid 1 MG tablet Commonly known as: FOLVITE Take 1 mg by mouth daily.   furosemide 20 MG tablet Commonly known as: LASIX Take 2 tablets (40 mg total) by mouth daily.   glimepiride 2 MG tablet Commonly known as: AMARYL Take 2 mg by mouth at bedtime.   metFORMIN 500 MG 24 hr tablet Commonly known as: GLUCOPHAGE-XR Take 1 tablet (500 mg total) by mouth 2 (two) times daily.   minoxidil 2.5 MG tablet Commonly known as: LONITEN Take 1.25 mg by mouth at bedtime.   nitroGLYCERIN 0.4 MG SL tablet Commonly known as: Nitrostat Place 1 tablet (0.4 mg total) under the tongue every 5 (five) minutes as needed for chest pain.   ofloxacin 0.3 % ophthalmic solution Commonly known as: OCUFLOX Place 1 drop into both eyes daily as needed  (Before procedure eye injections).   polyvinyl alcohol 1.4 % ophthalmic solution Commonly known as: LIQUIFILM TEARS Place 1 drop into the left eye daily as needed for dry eyes.   Vitamin D3 125 MCG (5000 UT) Caps Take 5,000 Units by mouth daily.   zolpidem 12.5 MG CR tablet Commonly known as: AMBIEN CR TAKE ONE TABLET BY MOUTH EVERY NIGHT AT BEDTIME AS NEEDED FOR SLEEP What changed:  how much to take how to take this when to take this reasons to take this additional instructions        BP 110/62   Pulse 65   Temp 98.1 F (36.7 C)   Resp 16   Wt 181 lb (82.1 kg)   SpO2 90%   BMI 25.97 kg/m  Gen: Awake, alert, appears stated age HEENT: Ears neg, nares patent without D/C, turbinates unremarkable, Pharynx pink without exudate Neck: Supple, no masses or asymmetry, no tenderness Heart: RRR, no LE edema Lungs: + Transmitted upper airway noises, CTAB otherwise, normal effort, no accessory muscle use Psych: Age appropriate judgement and insight, normal mood and affect  Subacute cough - Plan: albuterol (VENTOLIN HFA) 108 (90 Base) MCG/ACT inhaler, fluticasone-salmeterol (ADVAIR DISKUS) 250-50 MCG/ACT AEPB  Chronic obstructive pulmonary disease, unspecified COPD type (HCC)  NSTEMI (non-ST elevated myocardial infarction) (HCC)  Exacerbation of chronic issue.  He failed doxycycline and prednisone.  He has a normal chest x-ray as of 10/31 after this is yet started.  Saba as needed, Advair daily until resolution.  I also want him to start taking Mucinex 600 mg twice daily.  If no improvement will repeat chest x-ray and refer to pulmonology. F/u in 4 weeks if symptoms fail to improve. The patient voiced understanding and agreement to the plan.  Jilda Roche [place], DO 10/13/21 5:08 PM

## 2021-10-17 ENCOUNTER — Ambulatory Visit: Payer: Medicare Other | Admitting: Psychiatry

## 2021-10-19 ENCOUNTER — Telehealth: Payer: Self-pay | Admitting: Family Medicine

## 2021-10-19 DIAGNOSIS — R052 Subacute cough: Secondary | ICD-10-CM

## 2021-10-19 DIAGNOSIS — J449 Chronic obstructive pulmonary disease, unspecified: Secondary | ICD-10-CM

## 2021-10-19 NOTE — Telephone Encounter (Signed)
Please place order for 2 view chest X-ray at location of his convenience and place referral to pulm. Ty.

## 2021-10-19 NOTE — Telephone Encounter (Signed)
Orders and referral placed   Pt called and lvm to return call to notify him of cxr and referral

## 2021-10-19 NOTE — Telephone Encounter (Signed)
Patient wants Dr. Carmelia Roller to know that the medication he is on is almost out but he has not showed any improvements. He would like to know if he needs a stronger dose of if he needs to try another medication. Please advice.

## 2021-10-19 NOTE — Telephone Encounter (Signed)
Pt called back and notified of cxr

## 2021-10-19 NOTE — Telephone Encounter (Signed)
Patient states the inhalers aren't working , he is still coughing, its unchanged.   Patient wants more medication

## 2021-10-20 ENCOUNTER — Ambulatory Visit (HOSPITAL_BASED_OUTPATIENT_CLINIC_OR_DEPARTMENT_OTHER)
Admission: RE | Admit: 2021-10-20 | Discharge: 2021-10-20 | Disposition: A | Discharge status: Home / Self Care | Payer: Medicare Other | Source: Ambulatory Visit | Attending: Family Medicine | Admitting: Family Medicine

## 2021-10-20 ENCOUNTER — Other Ambulatory Visit: Payer: Self-pay | Admitting: Family Medicine

## 2021-10-20 ENCOUNTER — Other Ambulatory Visit: Payer: Self-pay

## 2021-10-20 DIAGNOSIS — R052 Subacute cough: Secondary | ICD-10-CM | POA: Diagnosis present

## 2021-10-20 DIAGNOSIS — J449 Chronic obstructive pulmonary disease, unspecified: Secondary | ICD-10-CM | POA: Diagnosis present

## 2021-10-20 MED ORDER — AZITHROMYCIN 250 MG PO TABS: ORAL_TABLET | ORAL | 0 refills | Status: DC

## 2021-10-20 MED ORDER — METHYLPREDNISOLONE 4 MG PO TBPK: ORAL_TABLET | ORAL | 0 refills | Status: DC

## 2021-10-23 ENCOUNTER — Encounter: Payer: Self-pay | Admitting: Family

## 2021-10-23 ENCOUNTER — Inpatient Hospital Stay (HOSPITAL_BASED_OUTPATIENT_CLINIC_OR_DEPARTMENT_OTHER): Payer: Medicare Other | Admitting: Family

## 2021-10-23 ENCOUNTER — Encounter (HOSPITAL_COMMUNITY): Payer: Self-pay

## 2021-10-23 ENCOUNTER — Inpatient Hospital Stay: Payer: Medicare Other | Attending: Hematology & Oncology

## 2021-10-23 ENCOUNTER — Other Ambulatory Visit: Payer: Self-pay

## 2021-10-23 VITALS — BP 132/68 | HR 84 | Temp 98.3°F | Resp 19 | Ht 70.0 in | Wt 182.0 lb

## 2021-10-23 DIAGNOSIS — D509 Iron deficiency anemia, unspecified: Secondary | ICD-10-CM | POA: Insufficient documentation

## 2021-10-23 DIAGNOSIS — D508 Other iron deficiency anemias: Secondary | ICD-10-CM

## 2021-10-23 DIAGNOSIS — Z9884 Bariatric surgery status: Secondary | ICD-10-CM | POA: Insufficient documentation

## 2021-10-23 LAB — CMP (CANCER CENTER ONLY)
ALT: 64 U/L — ABNORMAL HIGH (ref 0–44)
AST: 34 U/L (ref 15–41)
Albumin: 4.6 g/dL (ref 3.5–5.0)
Alkaline Phosphatase: 77 U/L (ref 38–126)
Anion gap: 8 (ref 5–15)
BUN: 39 mg/dL — ABNORMAL HIGH (ref 8–23)
CO2: 32 mmol/L (ref 22–32)
Calcium: 9.9 mg/dL (ref 8.9–10.3)
Chloride: 100 mmol/L (ref 98–111)
Creatinine: 1.07 mg/dL (ref 0.61–1.24)
GFR, Estimated: >60 mL/min (ref >60)
Glucose, Bld: 171 mg/dL — ABNORMAL HIGH (ref 70–99)
Potassium: 4.8 mmol/L (ref 3.5–5.1)
Sodium: 140 mmol/L (ref 135–145)
Total Bilirubin: 0.5 mg/dL (ref 0.3–1.2)
Total Protein: 7.1 g/dL (ref 6.5–8.1)

## 2021-10-23 LAB — IRON AND TIBC
Iron: 53 ug/dL (ref 42–163)
Saturation Ratios: 15 % — ABNORMAL LOW (ref 20–55)
TIBC: 359 ug/dL (ref 202–409)
UIBC: 306 ug/dL (ref 117–376)

## 2021-10-23 LAB — CBC WITH DIFFERENTIAL (CANCER CENTER ONLY)
Abs Immature Granulocytes: 0.06 10*3/uL (ref 0.00–0.07)
Basophils Absolute: 0 10*3/uL (ref 0.0–0.1)
Basophils Relative: 0 %
Eosinophils Absolute: 0.1 10*3/uL (ref 0.0–0.5)
Eosinophils Relative: 0 %
HCT: 40.3 % (ref 39.0–52.0)
Hemoglobin: 12.7 g/dL — ABNORMAL LOW (ref 13.0–17.0)
Immature Granulocytes: 1 %
Lymphocytes Relative: 13 %
Lymphs Abs: 1.5 10*3/uL (ref 0.7–4.0)
MCH: 28.3 pg (ref 26.0–34.0)
MCHC: 31.5 g/dL (ref 30.0–36.0)
MCV: 89.8 fL (ref 80.0–100.0)
Monocytes Absolute: 0.8 10*3/uL (ref 0.1–1.0)
Monocytes Relative: 7 %
Neutro Abs: 9.2 10*3/uL — ABNORMAL HIGH (ref 1.7–7.7)
Neutrophils Relative %: 79 %
Platelet Count: 291 10*3/uL (ref 150–400)
RBC: 4.49 MIL/uL (ref 4.22–5.81)
RDW: 12.9 % (ref 11.5–15.5)
WBC Count: 11.7 10*3/uL — ABNORMAL HIGH (ref 4.0–10.5)
nRBC: 0 % (ref 0.0–0.2)

## 2021-10-23 LAB — FERRITIN: Ferritin: 84 ng/mL (ref 24–336)

## 2021-10-23 NOTE — Progress Notes (Signed)
Hematology and Oncology Follow Up Visit  Alec Taylor [id number] November 13, 1946 75 y.o. 10/23/2021   Principle Diagnosis:  Iron deficiency anemia secondary to gastric bypass   Current Therapy: IV iron as indicated    Interim History:  Mr. Alec Taylor is here today for follow-up. He is doing fairly well getting over an upper respiratory infection. He is currently on Z pack and medrol dose pack. He has a cough with tan phlegm at times.  He notes some fatigue No fever, chills, n/v, rash, dizziness, chest pain, palpitations, abdominal pain or changes in bowel or bladder habits.  He was hospitalized in late October for NSTEMI and SVT. He has been wearing a cardiac monitor for the last 2 weeks.  No swelling, tenderness, numbness or tingling in his extremities at this time.  No falls or syncope to report.  His appetite comes and goes. He is doing his best to stay well hydrated. His weight is stable at 182 lbs.   ECOG Performance Status: 1 - Symptomatic but completely ambulatory  Medications:  Allergies as of 10/23/2021       Reactions   Celecoxib Other (See Comments)   hyperkalemia   Losartan Other (See Comments)   hyperkalemia   Ramipril Cough           Medication List        Accurate as of October 23, 2021 10:54 AM. If you have any questions, ask your nurse or doctor.          albuterol 108 (90 Base) MCG/ACT inhaler Commonly known as: VENTOLIN HFA Inhale 1-2 puffs into the lungs every 6 (six) hours as needed for wheezing or shortness of breath.   ALPRAZolam 0.25 MG tablet Commonly known as: XANAX TAKE ONE-HALF TO ONE TABLET BY MOUTH ONE TIME DAILY AS NEEDED FOR ANXIETY What changed:  how much to take how to take this when to take this reasons to take this additional instructions   aspirin EC 81 MG tablet Take 81 mg by mouth at bedtime.   atorvastatin 20 MG tablet Commonly known as: LIPITOR Take 1 tablet (20 mg total) by mouth daily. Please make an appointment for  further refills What changed:  when to take this additional instructions   azithromycin 250 MG tablet Commonly known as: ZITHROMAX Take 2 tabs the first day and then 1 tab daily until you run out.   buPROPion 150 MG 24 hr tablet Commonly known as: WELLBUTRIN XL Take 1 tablet (150 mg total) by mouth daily. What changed: when to take this   carvedilol 12.5 MG tablet Commonly known as: COREG Take 1 tablet (12.5 mg total) by mouth 2 (two) times daily with a meal.   donepezil 5 MG tablet Commonly known as: ARICEPT Take 1 tablet (5 mg total) by mouth daily.   DULoxetine 60 MG capsule Commonly known as: CYMBALTA Take 1 capsule (60 mg total) by mouth daily.   empagliflozin 10 MG Tabs tablet Commonly known as: Jardiance Take 1 tablet (10 mg total) by mouth daily. Please make an appointment for refills What changed: additional instructions   finasteride 5 MG tablet Commonly known as: PROSCAR Take 5 mg by mouth at bedtime.   folic acid 1 MG tablet Commonly known as: FOLVITE Take 1 mg by mouth daily.   furosemide 20 MG tablet Commonly known as: LASIX Take 2 tablets (40 mg total) by mouth daily.   glimepiride 2 MG tablet Commonly known as: AMARYL Take 2 mg by mouth at bedtime.  metFORMIN 500 MG 24 hr tablet Commonly known as: GLUCOPHAGE-XR Take 1 tablet (500 mg total) by mouth 2 (two) times daily.   methylPREDNISolone 4 MG Tbpk tablet Commonly known as: MEDROL DOSEPAK Follow instructions on package.   minoxidil 2.5 MG tablet Commonly known as: LONITEN Take 1.25 mg by mouth at bedtime.   nitroGLYCERIN 0.4 MG SL tablet Commonly known as: Nitrostat Place 1 tablet (0.4 mg total) under the tongue every 5 (five) minutes as needed for chest pain.   ofloxacin 0.3 % ophthalmic solution Commonly known as: OCUFLOX Place 1 drop into both eyes daily as needed (Before procedure eye injections).   polyvinyl alcohol 1.4 % ophthalmic solution Commonly known as: LIQUIFILM  TEARS Place 1 drop into the left eye daily as needed for dry eyes.   spironolactone 25 MG tablet Commonly known as: ALDACTONE Take 12.5 mg by mouth daily.   Vitamin D3 125 MCG (5000 UT) Caps Take 5,000 Units by mouth daily.   zolpidem 12.5 MG CR tablet Commonly known as: AMBIEN CR TAKE ONE TABLET BY MOUTH EVERY NIGHT AT BEDTIME AS NEEDED FOR SLEEP What changed:  how much to take how to take this when to take this reasons to take this additional instructions        Allergies:  Allergies  Allergen Reactions   Celecoxib Other (See Comments)    hyperkalemia   Losartan Other (See Comments)    hyperkalemia   Ramipril Cough         Past Medical History, Surgical history, Social history, and Family History were reviewed and updated.  Review of Systems: All other 10 point review of systems is negative.   Physical Exam:  vitals were not taken for this visit.   Wt Readings from Last 3 Encounters:  10/13/21 181 lb (82.1 kg)  10/10/21 176 lb 6.4 oz (80 kg)  10/09/21 181 lb 9.6 oz (82.4 kg)    Ocular: Sclerae unicteric, pupils equal, round and reactive to light Ear-nose-throat: Oropharynx clear, dentition fair Lymphatic: No cervical or supraclavicular adenopathy Lungs no rales or rhonchi, good excursion bilaterally Heart regular rate and rhythm, no murmur appreciated Abd soft, nontender, positive bowel sounds MSK no focal spinal tenderness, no joint edema Neuro: non-focal, well-oriented, appropriate affect Breasts: Deferred   Lab Results  Component Value Date   WBC 11.7 (H) 10/23/2021   HGB 12.7 (L) 10/23/2021   HCT 40.3 10/23/2021   MCV 89.8 10/23/2021   PLT 291 10/23/2021   Lab Results  Component Value Date   FERRITIN 68 07/28/2021   IRON 71 07/28/2021   TIBC 356 07/28/2021   UIBC 285 07/28/2021   IRONPCTSAT 20 07/28/2021   Lab Results  Component Value Date   RETICCTPCT 1.1 06/19/2021   RBC 4.49 10/23/2021   No results found for: KPAFRELGTCHN,  LAMBDASER, KAPLAMBRATIO No results found for: IGGSERUM, IGA, IGMSERUM No results found for: Dorene Ar, A1GS, A2GS, Colin Benton, MSPIKE, SPEI   Chemistry      Component Value Date/Time   NA 140 10/23/2021 1005   NA 141 09/18/2021 1000   K 4.8 10/23/2021 1005   CL 100 10/23/2021 1005   CO2 32 10/23/2021 1005   BUN 39 (H) 10/23/2021 1005   BUN 37 (H) 09/18/2021 1000   CREATININE 1.07 10/23/2021 1005   CREATININE 0.97 05/12/2021 1607      Component Value Date/Time   CALCIUM 9.9 10/23/2021 1005   ALKPHOS 77 10/23/2021 1005   AST 34 10/23/2021 1005   ALT 64 (  H) 10/23/2021 1005   BILITOT 0.5 10/23/2021 1005       Impression and Plan: Mr. Chiarelli is a very pleasant 75 yo caucasian gentleman with history of iron deficiency anemia secondary to malabsorption after gastric bypass.  Iron studies are pending. We will replace if needed.  Follow-up in 6 months.  He can contact our office with any questions or concerns.   Eileen Stanford, NP 11/14/202210:54 AM

## 2021-10-24 ENCOUNTER — Telehealth: Payer: Self-pay | Admitting: *Deleted

## 2021-10-24 NOTE — Telephone Encounter (Signed)
Per scheduling message Sarah - Called and lvm for callback to schedule (3) doses of IV Iron. 

## 2021-10-24 NOTE — Telephone Encounter (Signed)
Per 10/23/21 los - gave upcoming appointments - confirmed 

## 2021-10-26 ENCOUNTER — Ambulatory Visit (INDEPENDENT_AMBULATORY_CARE_PROVIDER_SITE_OTHER): Payer: Medicare Other | Admitting: Internal Medicine

## 2021-10-26 ENCOUNTER — Other Ambulatory Visit: Payer: Self-pay

## 2021-10-26 ENCOUNTER — Encounter: Payer: Self-pay | Admitting: Internal Medicine

## 2021-10-26 VITALS — BP 106/70 | HR 71 | Temp 98.6°F | Ht 70.0 in | Wt 178.8 lb

## 2021-10-26 DIAGNOSIS — J449 Chronic obstructive pulmonary disease, unspecified: Secondary | ICD-10-CM

## 2021-10-26 DIAGNOSIS — Z23 Encounter for immunization: Secondary | ICD-10-CM | POA: Diagnosis not present

## 2021-10-26 DIAGNOSIS — R0602 Shortness of breath: Secondary | ICD-10-CM

## 2021-10-26 MED ORDER — FLUTICASONE PROPIONATE 50 MCG/ACT NA SUSP
1.0000 | Freq: Every day | NASAL | 5 refills | Status: AC
Start: 2021-10-26 — End: ?

## 2021-10-26 MED ORDER — OMEPRAZOLE 20 MG PO CPDR: 40.0000 mg | DELAYED_RELEASE_CAPSULE | Freq: Every day | ORAL | 11 refills | Status: AC

## 2021-10-26 NOTE — Progress Notes (Signed)
Alec Taylor    [id number]    1946-07-28  Primary Care Physician:Wendling, Jilda Roche, DO  Referring Physician: Sharlene Dory, DO [address] [place],  [place] [address] Reason for Consultation: COPD, cough Date of Consultation: 10/26/2021  Chief complaint:   Chief Complaint  Patient presents with   Pulmonary Consult    Referred by Dr Arva Chafe. Pt c/o persistent cough for the past 8 wks. He states he has hx of PNA and has been hospitalized x 2 in the past for this. He states cough is worse after he eats. Cough is prod with clear sputum. He also c/o SOB walking sometimes.     HPI: Alec Taylor is a 75 y.o. man who presents for new patient evaluation of cough and recurrent pneumonia.  He notes post nasal drainage which is chronic and has had this turn into pneumonia requiring hospitalization. He has associated dyspnea on exertion.   Has had two rounds of steroids and antibiotics this time and still has his symptoms.  He doesn't take anything for the nasal drainage. Has taken mucinex over the counter.   Was given an albuterol inhaler which did help his breathing. But didn't help the coughing.   Had gastric bypass in mid 2000s. Had gastric sleeve surgery. He does have reflux which sometimes wakes him up at night. Avoids spicy foods.   Feels like his cough is coming form his throat rather than his lungs.    Social history:  Occupation: retired, used to be Buyer, retail.  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  Smoking history: never smoker. Passive smoke exposure in childhood. No smokeless tobacco.   Social History   Occupational History   Not on file  Tobacco Use   Smoking status: Never   Smokeless tobacco: Never  Vaping Use   Vaping Use: Never used  Substance and Sexual Activity   Alcohol use: Yes    Alcohol/week: 2.0 standard drinks    Types: 2 Cans of beer per week    Comment: occ   Drug use:  Never   Sexual activity: Not on file    Relevant family history:  Family History  Problem Relation Age of Onset   Hypertension Father    COPD Father     Past Medical History:  Diagnosis Date   CAD (coronary artery disease) 09/2007   s/p CABG approximately 2004-[facility] (Cath - 17 Sep 2007:90% prox LAD with diffuse 85% narrowing to distal segment, 80% prox CXA, RCA: prox 90%, prox-mid 70%, mid 85%, distal 80%, D1 80%; Normal EF 65% --status post CABG--LIMA to LAD, SVG to PDA.     COPD (chronic obstructive pulmonary disease) (HCC)    Depression    Diabetes mellitus without complication (HCC)    Hypertension    Ischemic cardiomyopathy    Pleural effusion    S/P CABG x 2    CABG--LIMA to LAD, SVG to PDA.      Past Surgical History:  Procedure Laterality Date   APPENDECTOMY     CHOLECYSTECTOMY     CORONARY ARTERY BYPASS GRAFT     GASTRIC BYPASS     LEFT HEART CATH AND CORS/GRAFTS ANGIOGRAPHY N/A 04/30/2018   Procedure: LEFT HEART CATH AND CORS/GRAFTS ANGIOGRAPHY;  Surgeon: Marykay Lex, MD;  Location: [facility] INVASIVE CV LAB;  Service: Cardiovascular;  Laterality: N/A;   LEFT HEART CATH AND CORS/GRAFTS ANGIOGRAPHY N/A 10/10/2021   Procedure: LEFT HEART  CATH AND CORS/GRAFTS ANGIOGRAPHY;  Surgeon: Dolores Patty, MD;  Location: [facility] INVASIVE CV LAB;  Service: Cardiovascular;  Laterality: N/A;   RIGHT/LEFT HEART CATH AND CORONARY/GRAFT ANGIOGRAPHY N/A 02/18/2020   Procedure: RIGHT/LEFT HEART CATH AND CORONARY/GRAFT ANGIOGRAPHY;  Surgeon: Dolores Patty, MD;  Location: [facility] INVASIVE CV LAB;  Service: Cardiovascular;  Laterality: N/A;   TOE AMPUTATION       Physical Exam: Blood pressure 106/70, pulse 71, temperature 98.6 F (37 C), temperature source Oral, height 5\' 10"  (1.778 m), weight 178 lb 12.8 oz (81.1 kg), SpO2 97 %. Gen:      No acute distress, hoarse voice with frequent throat clearing ENT:  no nasal polyps, mucus membranes moist Lungs:    No increased respiratory effort,  symmetric chest wall excursion, clear to auscultation bilaterally, no wheezes or crackles CV:         Regular rate and rhythm; no murmurs, rubs, or gallops.  No pedal edema Abd:      + bowel sounds; soft, non-tender; no distension MSK: no acute synovitis of DIP or PIP joints, no mechanics hands.  Skin:      Warm and dry; no rashes Neuro: normal speech, no focal facial asymmetry Psych: alert and oriented x3, normal mood and affect   Data Reviewed/Medical Decision Making:  Independent interpretation of tests: Imaging:  Review of patient's chest xray Nov 2022 images revealed no acute process, prior sternotomy from CABG. The patient's images have been independently reviewed by me.    CTPE study May 2019 shows bilateral pleural effusions  PFTs: none No flowsheet data found.  Labs:  Lab Results  Component Value Date   WBC 11.7 (H) 10/23/2021   HGB 12.7 (L) 10/23/2021   HCT 40.3 10/23/2021   MCV 89.8 10/23/2021   PLT 291 10/23/2021   Lab Results  Component Value Date   NA 140 10/23/2021   K 4.8 10/23/2021   CL 100 10/23/2021   CO2 32 10/23/2021     Immunization status:  Immunization History  Administered Date(s) Administered   Fluad Quad(high Dose 65+) 10/26/2021   Hepatitis A 11/30/2013   Hepatitis A, Adult 11/30/2013, 11/30/2013, 01/04/2014   Hepatitis B, adult 11/30/2013, 01/04/2014   Influenza, High Dose Seasonal PF 10/29/2018   Influenza, Quadrivalent, Recombinant, Inj, Pf 09/05/2017   Influenza, Seasonal, Injecte, Preservative Fre 10/22/2011   Influenza,inj,Quad PF,6+ Mos 08/21/2012, 08/17/2015, 10/04/2016, 09/05/2017   Influenza,inj,quad, With Preservative 11/08/2014   Influenza-Unspecified 10/09/2006, 12/15/2007, 10/24/2009, 09/05/2010, 08/07/2013   PFIZER(Purple Top)SARS-COV-2 Vaccination 12/31/2019, 01/21/2020   Pneumococcal Conjugate-13 11/10/2013, 11/08/2014   Pneumococcal Polysaccharide-23 06/17/2008, 11/22/2015   Pneumococcal-Unspecified 06/17/2008,  11/10/2013, 11/22/2015   Td 12/10/2002   Tdap 04/01/2008, 04/01/2008, 07/14/2019, 12/10/2020   Zoster Recombinat (Shingrix) 08/07/2013   Zoster, Live 08/07/2013     I reviewed prior external note(s) from PCP, oncology, cardiology, family medicine  I reviewed the result(s) of the labs and imaging as noted above.   I have ordered PFT   Assessment:  Shortness of breath Chronic Cough GERD Post nasal drainage   Plan/Recommendations: He has dyspnea which is unexplained by work up thus far. Consideration for this being related to his CAD, but should exclude lung disease - will obtain full set of PFTs. Do not think this is COPD based on no smoking history and minimal remote passive smoke exposure.  Chronic cough could be secondary to GERD, post nasal drainage. Will start GERD and also start flonase.  We discussed disease management and progression at length today for  challenges in treating chronic cough.  I will see him back after PFTs and trial of PPI/flonase   Return to Care: Return in about 2 months (around 12/26/2021).  Durel Salts, MD Pulmonary and Critical Care Medicine [facility] Office:[phone]  CC: Sharlene Dory*

## 2021-10-26 NOTE — Patient Instructions (Addendum)
Please schedule follow up scheduled with myself in 2 months.  If my schedule is not open yet, we will contact you with a reminder closer to that time.  Before your next visit I would like you to have: Full set of PFTs - 1 hour  Start taking flonase for your nasal congestion. Start taking omeprazole for your reflux.  We'll see how those affect your coughing.   Flonase - 1 spray on each side of your nose twice a day for first week, then 1 spray on each side.   Instructions for use: If you also use a saline nasal spray or rinse, use that first. Position the head with the chin slightly tucked. Use the right hand to spray into the left nostril and the right hand to spray into the left nostril.   Point the bottle away from the septum of your nose (cartilage that divides the two sides of your nose).  Hold the nostril closed on the opposite side from where you will spray Spray once and gently sniff to pull the medicine into the higher parts of your nose.  Don't sniff too hard as the medicine will drain down the back of your throat instead. Repeat with a second spray on the same side if prescribed. Repeat on the other side of your nose.

## 2021-10-30 ENCOUNTER — Inpatient Hospital Stay: Payer: Medicare Other

## 2021-10-30 ENCOUNTER — Other Ambulatory Visit: Payer: Self-pay

## 2021-10-30 VITALS — BP 128/65 | HR 80

## 2021-10-30 DIAGNOSIS — D509 Iron deficiency anemia, unspecified: Secondary | ICD-10-CM | POA: Diagnosis not present

## 2021-10-30 MED ORDER — SODIUM CHLORIDE 0.9 % IV SOLN: Freq: Once | INTRAVENOUS | Status: DC | PRN

## 2021-10-30 MED ORDER — SODIUM CHLORIDE 0.9 % IV SOLN
200.0000 mg | Freq: Once | INTRAVENOUS | Status: AC
  Administered 2021-10-30: 200 mg via INTRAVENOUS
  Filled 2021-10-30: qty 200

## 2021-10-30 NOTE — Patient Instructions (Signed)

## 2021-11-04 ENCOUNTER — Other Ambulatory Visit (HOSPITAL_COMMUNITY): Payer: Self-pay | Admitting: Internal Medicine

## 2021-11-04 DIAGNOSIS — I5022 Chronic systolic (congestive) heart failure: Secondary | ICD-10-CM

## 2021-11-06 ENCOUNTER — Inpatient Hospital Stay: Payer: Medicare Other

## 2021-11-06 ENCOUNTER — Other Ambulatory Visit: Payer: Self-pay

## 2021-11-06 VITALS — BP 104/49 | HR 74 | Temp 98.2°F | Resp 17

## 2021-11-06 DIAGNOSIS — D509 Iron deficiency anemia, unspecified: Secondary | ICD-10-CM | POA: Diagnosis not present

## 2021-11-06 MED ORDER — SODIUM CHLORIDE 0.9 % IV SOLN: Freq: Once | INTRAVENOUS | Status: AC

## 2021-11-06 MED ORDER — SODIUM CHLORIDE 0.9 % IV SOLN
200.0000 mg | Freq: Once | INTRAVENOUS | Status: AC
  Administered 2021-11-06: 10:00:00 200 mg via INTRAVENOUS
  Filled 2021-11-06: qty 200

## 2021-11-06 NOTE — Patient Instructions (Signed)

## 2021-11-07 ENCOUNTER — Ambulatory Visit (HOSPITAL_COMMUNITY)
Admission: RE | Admit: 2021-11-07 | Discharge: 2021-11-07 | Disposition: A | Discharge status: Home / Self Care | Payer: Medicare Other | Source: Ambulatory Visit | Attending: Internal Medicine | Admitting: Internal Medicine

## 2021-11-07 ENCOUNTER — Encounter: Payer: Self-pay | Admitting: Cardiology

## 2021-11-07 ENCOUNTER — Telehealth (INDEPENDENT_AMBULATORY_CARE_PROVIDER_SITE_OTHER): Payer: Medicare Other | Admitting: Cardiology

## 2021-11-07 VITALS — BP 114/70 | HR 75 | Wt 182.2 lb

## 2021-11-07 VITALS — Ht 70.0 in | Wt 178.0 lb

## 2021-11-07 DIAGNOSIS — E119 Type 2 diabetes mellitus without complications: Secondary | ICD-10-CM | POA: Insufficient documentation

## 2021-11-07 DIAGNOSIS — E785 Hyperlipidemia, unspecified: Secondary | ICD-10-CM | POA: Diagnosis not present

## 2021-11-07 DIAGNOSIS — I10 Essential (primary) hypertension: Secondary | ICD-10-CM | POA: Diagnosis not present

## 2021-11-07 DIAGNOSIS — I471 Supraventricular tachycardia: Secondary | ICD-10-CM

## 2021-11-07 DIAGNOSIS — G4733 Obstructive sleep apnea (adult) (pediatric): Secondary | ICD-10-CM

## 2021-11-07 DIAGNOSIS — Z79899 Other long term (current) drug therapy: Secondary | ICD-10-CM | POA: Diagnosis not present

## 2021-11-07 DIAGNOSIS — Z951 Presence of aortocoronary bypass graft: Secondary | ICD-10-CM | POA: Diagnosis not present

## 2021-11-07 DIAGNOSIS — D649 Anemia, unspecified: Secondary | ICD-10-CM | POA: Insufficient documentation

## 2021-11-07 DIAGNOSIS — Z9884 Bariatric surgery status: Secondary | ICD-10-CM | POA: Insufficient documentation

## 2021-11-07 DIAGNOSIS — I5022 Chronic systolic (congestive) heart failure: Secondary | ICD-10-CM

## 2021-11-07 DIAGNOSIS — I2510 Atherosclerotic heart disease of native coronary artery without angina pectoris: Secondary | ICD-10-CM | POA: Insufficient documentation

## 2021-11-07 DIAGNOSIS — Z7984 Long term (current) use of oral hypoglycemic drugs: Secondary | ICD-10-CM | POA: Diagnosis not present

## 2021-11-07 DIAGNOSIS — Z7982 Long term (current) use of aspirin: Secondary | ICD-10-CM | POA: Insufficient documentation

## 2021-11-07 DIAGNOSIS — I255 Ischemic cardiomyopathy: Secondary | ICD-10-CM

## 2021-11-07 DIAGNOSIS — I493 Ventricular premature depolarization: Secondary | ICD-10-CM | POA: Diagnosis not present

## 2021-11-07 DIAGNOSIS — E669 Obesity, unspecified: Secondary | ICD-10-CM | POA: Diagnosis not present

## 2021-11-07 DIAGNOSIS — I110 Hypertensive heart disease with heart failure: Secondary | ICD-10-CM | POA: Diagnosis not present

## 2021-11-07 MED ORDER — CARVEDILOL 12.5 MG PO TABS: 18.7500 mg | ORAL_TABLET | Freq: Two times a day (BID) | ORAL | 5 refills | Status: AC

## 2021-11-07 NOTE — Progress Notes (Signed)
Virtual Visit via Video Note   This visit type was conducted due to national recommendations for restrictions regarding the COVID-19 Pandemic (e.g. social distancing) in an effort to limit this patient's exposure and mitigate transmission in our community.  Due to his co-morbid illnesses, this patient is at least at moderate risk for complications without adequate follow up.  This format is felt to be most appropriate for this patient at this time.  All issues noted in this document were discussed and addressed.  A limited physical exam was performed with this format.  Please refer to the patient's chart for his consent to telehealth for [facility].   Date:  11/07/2021   ID:  Alec Taylor, DOB 02-06-1946, MRN [id number] The patient was identified using 2 identifiers.  Patient Location: Home Provider Location: Home Office   PCP:  Sharlene Dory, DO   [facility] Providers Cardiologist:  [name], MD Advanced Heart Failure:  Arvilla Meres, MD     Evaluation Performed:  Follow-Up Visit  Chief Complaint:  OSA  History of Present Illness:    Alec Taylor is a 75 y.o. male with a hx of ASCAD, DM, HTN, Ischemic DCM who was referred for evaluation of possible OSA.  He underwent home sleep study showing mild OSA with a AHI of 11.5/hr and nocturnal hypoxemia with O2 sats < 88% for .  CPAP was recommended but he wanted to discuss results prior to proceeding. He says that he was on CPAP when he weighed 300lbs and was very claustraphobic and could not tolerate the mask.    The patient does not have symptoms concerning for COVID-19 infection (fever, chills, cough, or new shortness of breath).    Past Medical History:  Diagnosis Date   CAD (coronary artery disease) 09/2007   s/p CABG approximately 2004-[facility] (Cath - 17 Sep 2007:90% prox LAD with diffuse 85% narrowing to distal segment, 80% prox CXA, RCA: prox 90%, prox-mid 70%, mid 85%, distal 80%, D1 80%; Normal EF  65% --status post CABG--LIMA to LAD, SVG to PDA.     COPD (chronic obstructive pulmonary disease) (HCC)    Depression    Diabetes mellitus without complication (HCC)    Hypertension    Ischemic cardiomyopathy    Pleural effusion    S/P CABG x 2    CABG--LIMA to LAD, SVG to PDA.     Past Surgical History:  Procedure Laterality Date   APPENDECTOMY     CHOLECYSTECTOMY     CORONARY ARTERY BYPASS GRAFT     GASTRIC BYPASS     LEFT HEART CATH AND CORS/GRAFTS ANGIOGRAPHY N/A 04/30/2018   Procedure: LEFT HEART CATH AND CORS/GRAFTS ANGIOGRAPHY;  Surgeon: Marykay Lex, MD;  Location: [facility] INVASIVE CV LAB;  Service: Cardiovascular;  Laterality: N/A;   LEFT HEART CATH AND CORS/GRAFTS ANGIOGRAPHY N/A 10/10/2021   Procedure: LEFT HEART CATH AND CORS/GRAFTS ANGIOGRAPHY;  Surgeon: Dolores Patty, MD;  Location: [facility] INVASIVE CV LAB;  Service: Cardiovascular;  Laterality: N/A;   RIGHT/LEFT HEART CATH AND CORONARY/GRAFT ANGIOGRAPHY N/A 02/18/2020   Procedure: RIGHT/LEFT HEART CATH AND CORONARY/GRAFT ANGIOGRAPHY;  Surgeon: Dolores Patty, MD;  Location: [facility] INVASIVE CV LAB;  Service: Cardiovascular;  Laterality: N/A;   TOE AMPUTATION       Current Meds  Medication Sig   ALPRAZolam (XANAX) 0.25 MG tablet TAKE ONE-HALF TO ONE TABLET BY MOUTH ONE TIME DAILY AS NEEDED FOR ANXIETY (Patient taking differently: Take 0.25 mg by mouth daily as needed for anxiety.)  aspirin EC 81 MG tablet Take 81 mg by mouth at bedtime.    atorvastatin (LIPITOR) 20 MG tablet Take 1 tablet (20 mg total) by mouth daily. Please make an appointment for further refills (Patient taking differently: Take 20 mg by mouth at bedtime.)   buPROPion (WELLBUTRIN XL) 150 MG 24 hr tablet Take 1 tablet (150 mg total) by mouth daily. (Patient taking differently: Take 150 mg by mouth in the morning and at bedtime.)   carvedilol (COREG) 12.5 MG tablet Take 1 tablet (12.5 mg total) by mouth 2 (two) times daily with a meal.   Cholecalciferol  (VITAMIN D3) 5000 units CAPS Take 5,000 Units by mouth daily.    donepezil (ARICEPT) 5 MG tablet Take 1 tablet (5 mg total) by mouth daily.   DULoxetine (CYMBALTA) 60 MG capsule Take 1 capsule (60 mg total) by mouth daily.   empagliflozin (JARDIANCE) 10 MG TABS tablet Take 1 tablet (10 mg total) by mouth daily. Please make an appointment for refills (Patient taking differently: Take 10 mg by mouth daily.)   finasteride (PROSCAR) 5 MG tablet Take 5 mg by mouth at bedtime.   fluticasone (FLONASE) 50 MCG/ACT nasal spray Place 1 spray into both nostrils daily.   folic acid (FOLVITE) 1 MG tablet Take 1 mg by mouth daily.   furosemide (LASIX) 20 MG tablet Take 2 tablets (40 mg total) by mouth daily.   glimepiride (AMARYL) 2 MG tablet Take 2 mg by mouth at bedtime.   metFORMIN (GLUCOPHAGE-XR) 500 MG 24 hr tablet Take 1 tablet (500 mg total) by mouth 2 (two) times daily.   minoxidil (LONITEN) 2.5 MG tablet Take 1.25 mg by mouth at bedtime.   nitroGLYCERIN (NITROSTAT) 0.4 MG SL tablet Place 1 tablet (0.4 mg total) under the tongue every 5 (five) minutes as needed for chest pain.   ofloxacin (OCUFLOX) 0.3 % ophthalmic solution Place 1 drop into both eyes daily as needed (Before procedure eye injections).   omeprazole (PRILOSEC) 20 MG capsule Take 2 capsules (40 mg total) by mouth daily.   polyvinyl alcohol (LIQUIFILM TEARS) 1.4 % ophthalmic solution Place 1 drop into the left eye daily as needed for dry eyes.   spironolactone (ALDACTONE) 25 MG tablet Take 12.5 mg by mouth daily.   zolpidem (AMBIEN CR) 12.5 MG CR tablet TAKE ONE TABLET BY MOUTH EVERY NIGHT AT BEDTIME AS NEEDED FOR SLEEP (Patient taking differently: Take 12.5 mg by mouth at bedtime as needed for sleep.)     Allergies:   Celecoxib, Losartan, and Ramipril   Social History   Tobacco Use   Smoking status: Never   Smokeless tobacco: Never  Vaping Use   Vaping Use: Never used  Substance Use Topics   Alcohol use: Yes    Alcohol/week: 2.0  standard drinks    Types: 2 Cans of beer per week    Comment: occ   Drug use: Never     Family Hx: The patient's family history includes COPD in his father; Hypertension in his father.  ROS:   Please see the history of present illness.     All other systems reviewed and are negative.   Prior CV studies:   The following studies were reviewed today:  Home sleep study  Labs/Other Tests and Data Reviewed:    EKG:  No ECG reviewed.  Recent Labs: 10/09/2021: B Natriuretic Peptide 580.7; TSH 3.793 10/23/2021: ALT 64; BUN 39; Creatinine 1.07; Hemoglobin 12.7; Platelet Count 291; Potassium 4.8; Sodium 140   Recent  Lipid Panel Lab Results  Component Value Date/Time   CHOL 117 07/14/2019 08:26 AM   CHOL 120 05/08/2018 09:49 AM   TRIG 137.0 07/14/2019 08:26 AM   HDL 39.20 07/14/2019 08:26 AM   HDL 39 (L) 05/08/2018 09:49 AM   CHOLHDL 3 07/14/2019 08:26 AM   LDLCALC 50 07/14/2019 08:26 AM   LDLCALC 66 05/08/2018 09:49 AM    Wt Readings from Last 3 Encounters:  11/07/21 178 lb (80.7 kg)  10/26/21 178 lb 12.8 oz (81.1 kg)  10/23/21 182 lb (82.6 kg)     Risk Assessment/Calculations:          Objective:    Vital Signs:  Ht 5\' 10"  (1.778 m)   Wt 178 lb (80.7 kg)   BMI 25.54 kg/m    VITAL SIGNS:  reviewed GEN:  no acute distress EYES:  sclerae anicteric, EOMI - Extraocular Movements Intact RESPIRATORY:  normal respiratory effort, symmetric expansion CARDIOVASCULAR:  no peripheral edema SKIN:  no rash, lesions or ulcers. MUSCULOSKELETAL:  no obvious deformities. NEURO:  alert and oriented x 3, no obvious focal deficit PSYCH:  normal affect  ASSESSMENT & PLAN:    OSA -sleep study showed mild OSA but also has nocturnal hypoxemia with nadir O2 sat 80% and O2 sats < 88% for 25 min -CPAP titration was recommended due to nocturna hypoxemia and underlying cardiac disease -I have discussed the results with the patient and the role OSA plays in cardiac disease -he is not  a candidate for the Inspire device due to AHI < 15/hr. -he is willing to try CPAP again using a nasal pillow mask -I will order an auto CPAP from 4 to 15cm H2O with heated humidity, nasal pillow mask and get an overnight pulse ox on CPAP to make sure his nocturnal hypoxemia resolves  2.  HTN -BP controlled on exam today -continue prescription drug management with Carvedilol 12.5mg  BID, spiro 12.5mg  daily with PRN refills    COVID-19 Education: The signs and symptoms of COVID-19 were discussed with the patient and how to seek care for testing (follow up with PCP or arrange E-visit).  The importance of social distancing was discussed today.  Time:   Today, I have spent 20 minutes with the patient with telehealth technology discussing the above problems.     Medication Adjustments/Labs and Tests Ordered: Current medicines are reviewed at length with the patient today.  Concerns regarding medicines are outlined above.   Tests Ordered: No orders of the defined types were placed in this encounter.   Medication Changes: No orders of the defined types were placed in this encounter.   Follow Up:  In Person in 6 week(s) after he gets his PAP device  Signed, , MD  11/07/2021 9:32 AM    [facility]

## 2021-11-07 NOTE — Progress Notes (Signed)
Advanced Heart Failure Clinic Note   Referring Physician: Dr Burt Knack Primary Care: Dr Valora Piccolo Primary [facility] Cardiologist: Dr Atilano Median   HPI: Alec Taylor is a 75 y.o. male with history of CAD s/p CABG 2004 at [facility], HTN, hyperlipidemia, DMII, obesity s/p gastric bypass and systolic HF.   Echocardiogram 12/2017  revealed LVEF of 45 to 50%.   Admitted 04/28/18 with increased dyspnea. CTA negative for PE. ECHO performed and showed reduced EF 35-40%. He underwent LHC . Stable coronary disease and grafts. Diuresed well. Discharge weight was 177 pounds.    Presented for CPX test on 10/31. Found to have to have SVT 140s. While in the ED, he spontaneously converted to NSR w/ PAC, HR 80s. HS trop 1,100>>1,139>>1,219. Placed on IV heparin for elevated troponin and admitted for observation and LHC.  Echo showed normal LVEF, 55%, normal RV and mild distal lateral, distal anterior hypokinesis. No significant change from previous echo from Aug 2022.   LHC showed overall stable coronary anatomy. Does have high-grade lesion in dLCX which may be source of ischemia but this is unchanged from previous. Treated medically.    Zio placed 11/22:   1. Sinus rhythm with bundle branch block - avg HR of 81 bpm.  2. 14 runs NSVT. The run with the fastest interval lasting 4 beats with a max rate of 231 bpm, the longest lasting 8 beats with an avg rate of 112 bpm.  3. 2266 SVT runs occurred, the run with the fastest interval lasting 5 beats with a max rate of 194 bpm, the longest lasting 59.0 secs with an avg rate of 143 bpm.  4. Isolated PACs were frequent (16.0%, [id number]), 5. Rare PVCs (<1.0%, 52).   Here for post-hospital f/u with his daughter. Feeling pretty good   Echo 07/28/21 EF 45-50% (read as 50-55%) Personally reviewed Echo 11/22 LVEF, 55%, normal RV and mild distal lateral, distal anterior hypokinesis.    Cath 10/10/21   Dist LM to Ost LAD lesion is 75% stenosed.   Prox LAD lesion is 100% stenosed.   Prox  Cx to Mid Cx lesion is 25% stenosed.   Prox RCA to Mid RCA lesion is 65% stenosed.   Dist RCA-1 lesion is 85% stenosed.   Dist RCA-2 lesion is 95% stenosed with 80% stenosed side branch in RPAV.   Ost 1st Diag lesion is 70% stenosed.   Ost RPDA lesion is 45% stenosed.   Ost RPDA to RPDA lesion is 90% stenosed.   Ost 3rd Mrg to 3rd Mrg lesion is 100% stenosed.   Dist Cx lesion is 95% stenosed.   SVG and is normal in caliber and large.   LIMA and is large.   The graft exhibits no disease.   The graft exhibits no disease.   Cardiac studies:  Cath 3/21 1. Severe native 3v CAD with patent grafts to LAD and dRCA 2. EF 45-50% Ao = 115/51 (76) LV = 107/17 RA = 6 RV = 45/6 PA =  51/20 (33) PCW = 19 (v wave 28) Fick cardiac output/index = 8.0/4.0 PVR = 1.4 WU FA sat = 99% PA sat = 69%, 74% SVC sat = 72%  Echo 3/21 . LV dilated EF 40-45% RV ok. Personally reviewed Echo 1/20 EF 40-45%   CMRI 8/14  1. Normal left ventricular size with mild concentric hypertrophy and severely decreased systolic function (LVEF = 38%) with diffuse hypokinesis.  There is pericardial late gadolinium enhancement in the basal and mid inferior walls.  2. Normal right ventricular size, thickness and borderline systolic function (LVEF = 45%). There are no regional wall motion abnormalities.  3.  Moderately dilated left atrium, mildly dilated right atrium.  4. Normal size of the aortic root, ascending aorta. Mildly dilated pulmonary artery measuring 31 mm.  5.  Mild aortic, mitral and trivial tricuspid regurgitation.  6.  Normal pericardium.  No pericardial effusion   Review of systems complete and found to be negative unless listed in HPI.   Past Medical History:  Diagnosis Date   CAD (coronary artery disease) 09/2007   s/p CABG approximately 2004-[facility] (Cath - 17 Sep 2007:90% prox LAD with diffuse 85% narrowing to distal segment, 80% prox CXA, RCA: prox 90%, prox-mid 70%, mid 85%, distal 80%, D1 80%;  Normal EF 65% --status post CABG--LIMA to LAD, SVG to PDA.     COPD (chronic obstructive pulmonary disease) (HCC)    Depression    Diabetes mellitus without complication (HCC)    Hypertension    Ischemic cardiomyopathy    Pleural effusion    S/P CABG x 2    CABG--LIMA to LAD, SVG to PDA.     Current Outpatient Medications  Medication Sig Dispense Refill   ALPRAZolam (XANAX) 0.25 MG tablet TAKE ONE-HALF TO ONE TABLET BY MOUTH ONE TIME DAILY AS NEEDED FOR ANXIETY (Patient taking differently: Take 0.25 mg by mouth daily as needed for anxiety.) 30 tablet 2   aspirin EC 81 MG tablet Take 81 mg by mouth at bedtime.      atorvastatin (LIPITOR) 20 MG tablet Take 1 tablet (20 mg total) by mouth daily. Please make an appointment for further refills (Patient taking differently: Take 20 mg by mouth at bedtime.) 30 tablet 6   buPROPion (WELLBUTRIN XL) 150 MG 24 hr tablet Take 1 tablet (150 mg total) by mouth daily. (Patient taking differently: Take 150 mg by mouth in the morning and at bedtime.) 90 tablet 1   Cholecalciferol (VITAMIN D3) 5000 units CAPS Take 5,000 Units by mouth daily.      donepezil (ARICEPT) 5 MG tablet Take 1 tablet (5 mg total) by mouth daily. 90 tablet 1   empagliflozin (JARDIANCE) 10 MG TABS tablet Take 1 tablet (10 mg total) by mouth daily. Please make an appointment for refills (Patient taking differently: Take 10 mg by mouth daily.) 90 tablet 3   finasteride (PROSCAR) 5 MG tablet Take 5 mg by mouth at bedtime.  6   fluticasone (FLONASE) 50 MCG/ACT nasal spray Place 1 spray into both nostrils daily. 16 g 5   folic acid (FOLVITE) 1 MG tablet Take 1 mg by mouth daily.  2   furosemide (LASIX) 20 MG tablet Take 2 tablets (40 mg total) by mouth daily. 60 tablet 4   glimepiride (AMARYL) 2 MG tablet Take 2 mg by mouth at bedtime.  1   metFORMIN (GLUCOPHAGE-XR) 500 MG 24 hr tablet Take 1 tablet (500 mg total) by mouth 2 (two) times daily.  1   minoxidil (LONITEN) 2.5 MG tablet Take 1.25  mg by mouth at bedtime.     nitroGLYCERIN (NITROSTAT) 0.4 MG SL tablet Place 1 tablet (0.4 mg total) under the tongue every 5 (five) minutes as needed for chest pain. 100 tablet 3   ofloxacin (OCUFLOX) 0.3 % ophthalmic solution Place 1 drop into both eyes daily as needed (Before procedure eye injections).     omeprazole (PRILOSEC) 20 MG capsule Take 2 capsules (40 mg total) by mouth daily. 30 capsule 11  polyvinyl alcohol (LIQUIFILM TEARS) 1.4 % ophthalmic solution Place 1 drop into the left eye daily as needed for dry eyes.     spironolactone (ALDACTONE) 25 MG tablet Take 12.5 mg by mouth daily.     zolpidem (AMBIEN CR) 12.5 MG CR tablet TAKE ONE TABLET BY MOUTH EVERY NIGHT AT BEDTIME AS NEEDED FOR SLEEP (Patient taking differently: Take 12.5 mg by mouth at bedtime as needed for sleep.) 30 tablet 2   carvedilol (COREG) 12.5 MG tablet Take 1.5 tablets (18.75 mg total) by mouth 2 (two) times daily with a meal. 60 tablet 5   No current facility-administered medications for this encounter.   Allergies  Allergen Reactions   Celecoxib Other (See Comments)    hyperkalemia   Losartan Other (See Comments)    hyperkalemia   Ramipril Cough        Social History   Socioeconomic History   Marital status: Married    Spouse name: Not on file   Number of children: Not on file   Years of education: Not on file   Highest education level: Not on file  Occupational History   Not on file  Tobacco Use   Smoking status: Never   Smokeless tobacco: Never  Vaping Use   Vaping Use: Never used  Substance and Sexual Activity   Alcohol use: Yes    Alcohol/week: 2.0 standard drinks    Types: 2 Cans of beer per week    Comment: occ   Drug use: Never   Sexual activity: Not on file  Other Topics Concern   Not on file  Social History Narrative   Not on file   Social Determinants of Health   Financial Resource Strain: Not on file  Food Insecurity: Not on file  Transportation Needs: Not on file   Physical Activity: Not on file  Stress: Not on file  Social Connections: Not on file  Intimate Partner Violence: Not on file    Family History  Problem Relation Age of Onset   Hypertension Father    COPD Father    Vitals:   11/07/21 1527  BP: 114/70  Pulse: 75  SpO2: 98%  Weight: 82.6 kg (182 lb 3.2 oz)      Wt Readings from Last 3 Encounters:  11/07/21 82.6 kg (182 lb 3.2 oz)  11/07/21 80.7 kg (178 lb)  10/26/21 81.1 kg (178 lb 12.8 oz)     PHYSICAL EXAM: ]General:  Well appearing. No resp difficulty HEENT: normal Neck: supple. no JVD. Carotids 2+ bilat; no bruits. No lymphadenopathy or thryomegaly appreciated. Cor: PMI nondisplaced. Regular rate & rhythm. No rubs, gallops or murmurs. Lungs: clear Abdomen: soft, nontender, nondistended. No hepatosplenomegaly. No bruits or masses. Good bowel sounds. Extremities: no cyanosis, clubbing, rash, edema Neuro: alert & orientedx3, cranial nerves grossly intact. moves all 4 extremities w/o difficulty. Affect pleasant    ASSESSMENT & PLAN:  1. Chronic Systolic Heart Failure due to ICM - EF 1/19 45-50% - ECHO 5/19 EF 35-40%. ?Viral versus PVC on top of iCM - cMRI 8/19 EF 38% pericardial late gadolinium enhancement in the basal and mid inferior walls. Suspicious for recent myocarditis. - Echo 3/21 EF 40-45% - Cath 3/21: Underwent R/L cath with stable anatomy and no targets for PCI. -  Echo 07/28/21 EF 45-50% (read as 50-55%) Personally reviewed - Stable NYHA III.  - Volume status looks good - Increase carvedilol to 18.75 mg BID - Continue Entresto 49/51 mg BID.  - Continue Jardiance 10  -  Continue spironolactone 12.5 mg daily.   2. CAD S/P CABG  - LHC 04/30/2018 severe multivessel disease and patent LIMA-LAD SVG-PDA - Cath 3/21 stable anatomy. No PCI targets - LHC 11/22 stable patent LIMA to LAD and SVG-PDA with severe native CAD distal to grafts. High grade 95% lesion in small dLCX (no change) - No significant s/s angina.  Continue medical Rx - Continue statin, BB, and ASA.  3. SVT/frequent PACs/PVCs - he has very high burden PACs/SVT on Zio 10/22 - Sleep study as below - Will increase carvedilol - Refer to Dr. Lovena Le to discuss options of ablation, AAD or medical rx  4. Anemia - secondary to gastric bypass surgery - Iron stores low 05/28/18.  - He continues to receive feraheme - he has recurrent iron deficiency. Followed by PCP and GI   5. DM2 - Follows with Dr. Alanson Aly (Endo) - Continue Jardiance  6. Snoring with with frequent PACs and daytime fatigue - Home Sleep Study 08/18/2021- Mild OSA AHI 11.5, no central sleep apnea, oxygen desaturation < 30 minutes. - Has seen Dr. Radford Pax. Will try CPAP    Glori Bickers, MD  11:24 PM

## 2021-11-07 NOTE — Patient Instructions (Signed)
Increase Carvedilol to 18.75mg  (1&1/2 TAB) Twice daily  You have been referred to Dr Ladona Ridgel. The office will call you with an appointment. Your physician recommends that you schedule a follow-up appointment in: 6 months (May 2023) ** Call in March for an appointment    If you have any questions or concerns before your next appointment please send [place] a message through Alpha or call our office at [phone].    TO LEAVE A MESSAGE FOR THE NURSE SELECT OPTION 2, PLEASE LEAVE A MESSAGE INCLUDING: YOUR NAME DATE OF BIRTH CALL BACK NUMBER REASON FOR CALL**this is important as we prioritize the call backs  YOU WILL RECEIVE A CALL BACK THE SAME DAY AS LONG AS YOU CALL BEFORE 4:00 PM  At the [facility], you and your health needs are our priority. As part of our continuing mission to provide you with exceptional heart care, we have created designated Provider Care Teams. These Care Teams include your primary Cardiologist (physician) and Advanced Practice Providers (APPs- Physician Assistants and Nurse Practitioners) who all work together to provide you with the care you need, when you need it.   You may see any of the following providers on your designated Care Team at your next follow up: Dr Arvilla Meres Dr Carron Curie, NP Robbie Lis, [place] [facility] [place], [place] Karle Plumber, PharmD   Please be sure to bring in all your medications bottles to every appointment.

## 2021-11-09 ENCOUNTER — Telehealth: Payer: Self-pay | Admitting: *Deleted

## 2021-11-09 ENCOUNTER — Other Ambulatory Visit: Payer: Self-pay | Admitting: Family Medicine

## 2021-11-09 DIAGNOSIS — R052 Subacute cough: Secondary | ICD-10-CM

## 2021-11-09 DIAGNOSIS — G4733 Obstructive sleep apnea (adult) (pediatric): Secondary | ICD-10-CM

## 2021-11-09 NOTE — Telephone Encounter (Signed)
Order placed to [facility] via community message 

## 2021-11-09 NOTE — Telephone Encounter (Signed)
-----   Message from [place], [place] sent at 11/07/2021 10:03 AM EST ----- Please order an auto CPAP from 4 to 15cm H2O with heated humidity, nasal pillow mask and get an overnight pulse ox on CPAP to make sure his nocturnal hypoxemia resolves.  Followup with me 6 weeks after he gets his device in person. Thanks!

## 2021-11-13 ENCOUNTER — Other Ambulatory Visit: Payer: Self-pay

## 2021-11-13 ENCOUNTER — Inpatient Hospital Stay: Payer: Medicare Other | Attending: Hematology & Oncology

## 2021-11-13 VITALS — BP 110/49 | HR 80 | Temp 97.6°F | Resp 20

## 2021-11-13 DIAGNOSIS — K909 Intestinal malabsorption, unspecified: Secondary | ICD-10-CM | POA: Insufficient documentation

## 2021-11-13 DIAGNOSIS — D509 Iron deficiency anemia, unspecified: Secondary | ICD-10-CM | POA: Insufficient documentation

## 2021-11-13 DIAGNOSIS — Z9884 Bariatric surgery status: Secondary | ICD-10-CM | POA: Diagnosis not present

## 2021-11-13 MED ORDER — SODIUM CHLORIDE 0.9 % IV SOLN
200.0000 mg | Freq: Once | INTRAVENOUS | Status: AC
  Administered 2021-11-13: 200 mg via INTRAVENOUS
  Filled 2021-11-13: qty 200

## 2021-11-13 MED ORDER — SODIUM CHLORIDE 0.9 % IV SOLN: Freq: Once | INTRAVENOUS | Status: AC

## 2021-11-13 NOTE — Progress Notes (Signed)
Pt refused to stay for 30 minutes post iron infusion.  Pt without complaints at time of discharge. ?

## 2021-11-25 ENCOUNTER — Other Ambulatory Visit: Payer: Self-pay | Admitting: Psychiatry

## 2021-11-25 DIAGNOSIS — F411 Generalized anxiety disorder: Secondary | ICD-10-CM

## 2021-11-29 ENCOUNTER — Encounter: Payer: Self-pay | Admitting: Internal Medicine

## 2021-11-29 ENCOUNTER — Ambulatory Visit (INDEPENDENT_AMBULATORY_CARE_PROVIDER_SITE_OTHER): Payer: Medicare Other | Admitting: Internal Medicine

## 2021-11-29 ENCOUNTER — Other Ambulatory Visit: Payer: Self-pay

## 2021-11-29 DIAGNOSIS — I491 Atrial premature depolarization: Secondary | ICD-10-CM | POA: Insufficient documentation

## 2021-11-29 DIAGNOSIS — I471 Supraventricular tachycardia: Secondary | ICD-10-CM

## 2021-11-29 NOTE — H&P (View-Only) (Signed)
° ° ° ° °HPI °Alec Taylor is referred by Dr. DB for evaluation of SVT and PVC's. He has a h/o CAD s/p CABG. He does not have angina. He presented in early November with palpitations. He was found to be in SVT at 145/min. He was treated with medical therapy and his symptoms resolved spontaneously. He notes recurrent palpitations. He has not had chest pain. He feels palpitations when he lies down at night. He wore a cardiac monitor which demonstrated SVT and PVC's and PAC's. He has not had syncope.  °Allergies  °Allergen Reactions  ° Celecoxib Other (See Comments)  °  hyperkalemia  ° Losartan Other (See Comments)  °  hyperkalemia  ° Ramipril Cough  °   °  ° ° ° °Current Outpatient Medications  °Medication Sig Dispense Refill  ° ALPRAZolam (XANAX) 0.25 MG tablet TAKE 1/2 TO 1 TABLET BY MOUTH DAILY AS NEEDED FOR ANXIETY 30 tablet 1  ° aspirin EC 81 MG tablet Take 81 mg by mouth at bedtime.     ° atorvastatin (LIPITOR) 20 MG tablet Take 1 tablet (20 mg total) by mouth daily. Please make an appointment for further refills (Patient taking differently: Take 20 mg by mouth at bedtime.) 30 tablet 6  ° buPROPion (WELLBUTRIN XL) 150 MG 24 hr tablet Take 1 tablet (150 mg total) by mouth daily. (Patient taking differently: Take 150 mg by mouth in the morning and at bedtime.) 90 tablet 1  ° carvedilol (COREG) 12.5 MG tablet Take 1.5 tablets (18.75 mg total) by mouth 2 (two) times daily with a meal. 60 tablet 5  ° Cholecalciferol (VITAMIN D3) 5000 units CAPS Take 5,000 Units by mouth daily.     ° empagliflozin (JARDIANCE) 10 MG TABS tablet Take 1 tablet (10 mg total) by mouth daily. Please make an appointment for refills (Patient taking differently: Take 10 mg by mouth daily.) 90 tablet 3  ° finasteride (PROSCAR) 5 MG tablet Take 5 mg by mouth at bedtime.  6  ° fluticasone (FLONASE) 50 MCG/ACT nasal spray Place 1 spray into both nostrils daily. 16 g 5  ° folic acid (FOLVITE) 1 MG tablet Take 1 mg by mouth daily.  2  ° furosemide  (LASIX) 20 MG tablet Take 2 tablets (40 mg total) by mouth daily. 60 tablet 4  ° glimepiride (AMARYL) 2 MG tablet Take 2 mg by mouth at bedtime.  1  ° metFORMIN (GLUCOPHAGE-XR) 500 MG 24 hr tablet Take 1 tablet (500 mg total) by mouth 2 (two) times daily.  1  ° minoxidil (LONITEN) 2.5 MG tablet Take 1.25 mg by mouth at bedtime.    ° nitroGLYCERIN (NITROSTAT) 0.4 MG SL tablet Place 1 tablet (0.4 mg total) under the tongue every 5 (five) minutes as needed for chest pain. 100 tablet 3  ° ofloxacin (OCUFLOX) 0.3 % ophthalmic solution Place 1 drop into both eyes daily as needed (Before procedure eye injections).    ° omeprazole (PRILOSEC) 20 MG capsule Take 2 capsules (40 mg total) by mouth daily. 30 capsule 11  ° polyvinyl alcohol (LIQUIFILM TEARS) 1.4 % ophthalmic solution Place 1 drop into the left eye daily as needed for dry eyes.    ° spironolactone (ALDACTONE) 25 MG tablet Take 12.5 mg by mouth daily.    ° zolpidem (AMBIEN CR) 12.5 MG CR tablet TAKE ONE TABLET BY MOUTH EVERY NIGHT AT BEDTIME AS NEEDED FOR SLEEP (Patient taking differently: Take 12.5 mg by mouth at bedtime as needed for sleep.) 30   tablet 2  ° donepezil (ARICEPT) 5 MG tablet Take 1 tablet (5 mg total) by mouth daily. 90 tablet 1  ° °No current facility-administered medications for this visit.  ° ° ° °Past Medical History:  °Diagnosis Date  ° CAD (coronary artery disease) 09/2007  ° s/p CABG approximately 2004-[facility] (Cath - 17 Sep 2007:90% prox LAD with diffuse 85% narrowing to distal segment, 80% prox CXA, RCA: prox 90%, prox-mid 70%, mid 85%, distal 80%, D1 80%; Normal EF 65% --status post CABG--LIMA to LAD, SVG to PDA.    ° COPD (chronic obstructive pulmonary disease) (HCC)   ° Depression   ° Diabetes mellitus without complication (HCC)   ° Hypertension   ° Ischemic cardiomyopathy   ° Pleural effusion   ° S/P CABG x 2   ° CABG--LIMA to LAD, SVG to PDA.    ° ° °ROS: ° ° All systems reviewed and negative except as noted in the HPI. ° ° °Past Surgical  History:  °Procedure Laterality Date  ° APPENDECTOMY    ° CHOLECYSTECTOMY    ° CORONARY ARTERY BYPASS GRAFT    ° GASTRIC BYPASS    ° LEFT HEART CATH AND CORS/GRAFTS ANGIOGRAPHY N/A 04/30/2018  ° Procedure: LEFT HEART CATH AND CORS/GRAFTS ANGIOGRAPHY;  Surgeon: Harding, David W, MD;  Location: [facility];  Service: Cardiovascular;  Laterality: N/A;  ° LEFT HEART CATH AND CORS/GRAFTS ANGIOGRAPHY N/A 10/10/2021  ° Procedure: LEFT HEART CATH AND CORS/GRAFTS ANGIOGRAPHY;  Surgeon: Bensimhon, Daniel R, MD;  Location: [facility];  Service: Cardiovascular;  Laterality: N/A;  ° RIGHT/LEFT HEART CATH AND CORONARY/GRAFT ANGIOGRAPHY N/A 02/18/2020  ° Procedure: RIGHT/LEFT HEART CATH AND CORONARY/GRAFT ANGIOGRAPHY;  Surgeon: Bensimhon, Daniel R, MD;  Location: [facility];  Service: Cardiovascular;  Laterality: N/A;  ° TOE AMPUTATION    ° ° ° °Family History  °Problem Relation Age of Onset  ° Hypertension Father   ° COPD Father   ° ° ° °Social History  ° °Socioeconomic History  ° Marital status: Married  °  Spouse name: Not on file  ° Number of children: Not on file  ° Years of education: Not on file  ° Highest education level: Not on file  °Occupational History  ° Not on file  °Tobacco Use  ° Smoking status: Never  ° Smokeless tobacco: Never  °Vaping Use  ° Vaping Use: Never used  °Substance and Sexual Activity  ° Alcohol use: Yes  °  Alcohol/week: 2.0 standard drinks  °  Types: 2 Cans of beer per week  °  Comment: occ  ° Drug use: Never  ° Sexual activity: Not on file  °Other Topics Concern  ° Not on file  °Social History Narrative  ° Not on file  ° °Social Determinants of Health  ° °Financial Resource Strain: Not on file  °Food Insecurity: Not on file  °Transportation Needs: Not on file  °Physical Activity: Not on file  °Stress: Not on file  °Social Connections: Not on file  °Intimate Partner Violence: Not on file  ° ° ° °BP 128/64    Pulse 99    Ht 5' 10" (1.778 m)    Wt 177 lb (80.3 kg)    SpO2 96%     BMI 25.40 kg/m²  ° °Physical Exam: ° °Well appearing NAD °HEENT: Unremarkable °Neck:  No JVD, no thyromegally °Lymphatics:  No adenopathy °Back:  No CVA tenderness °Lungs:  Clear °HEART:  Regular rate rhythm, no murmurs, no   rubs, no clicks Abd:  soft, positive bowel sounds, no organomegally, no rebound, no guarding Ext:  2 plus pulses, no edema, no cyanosis, no clubbing Skin:  No rashes no nodules Neuro:  CN II through XII intact, motor grossly intact  EKG - nsr  DEVICE  Normal device function.  See PaceArt for details.   Assess/Plan:  SVT - I suspect that he is having AT rather than atrial flutter. I have offered him EP study and catether ablation. He will call us if he wishes to proceed.  CAD - he denies anginal symptoms though he feels poorly when he is out of rhythm. Chronic systolic and diastolic heart failure - he has had an imrovement in his LV function. He will continue his current meds  Sharlot Gowda Mann Skaggs,MD

## 2021-11-29 NOTE — Progress Notes (Signed)
HPI Alec Taylor is referred by Dr. Dorthea Cove for evaluation of SVT and PVC's. He has a h/o CAD s/p CABG. He does not have angina. He presented in early November with palpitations. He was found to be in SVT at 145/min. He was treated with medical therapy and his symptoms resolved spontaneously. He notes recurrent palpitations. He has not had chest pain. He feels palpitations when he lies down at night. He wore a cardiac monitor which demonstrated SVT and PVC's and PAC's. He has not had syncope.  Allergies  Allergen Reactions   Celecoxib Other (See Comments)    hyperkalemia   Losartan Other (See Comments)    hyperkalemia   Ramipril Cough          Current Outpatient Medications  Medication Sig Dispense Refill   ALPRAZolam (XANAX) 0.25 MG tablet TAKE 1/2 TO 1 TABLET BY MOUTH DAILY AS NEEDED FOR ANXIETY 30 tablet 1   aspirin EC 81 MG tablet Take 81 mg by mouth at bedtime.      atorvastatin (LIPITOR) 20 MG tablet Take 1 tablet (20 mg total) by mouth daily. Please make an appointment for further refills (Patient taking differently: Take 20 mg by mouth at bedtime.) 30 tablet 6   buPROPion (WELLBUTRIN XL) 150 MG 24 hr tablet Take 1 tablet (150 mg total) by mouth daily. (Patient taking differently: Take 150 mg by mouth in the morning and at bedtime.) 90 tablet 1   carvedilol (COREG) 12.5 MG tablet Take 1.5 tablets (18.75 mg total) by mouth 2 (two) times daily with a meal. 60 tablet 5   Cholecalciferol (VITAMIN D3) 5000 units CAPS Take 5,000 Units by mouth daily.      empagliflozin (JARDIANCE) 10 MG TABS tablet Take 1 tablet (10 mg total) by mouth daily. Please make an appointment for refills (Patient taking differently: Take 10 mg by mouth daily.) 90 tablet 3   finasteride (PROSCAR) 5 MG tablet Take 5 mg by mouth at bedtime.  6   fluticasone (FLONASE) 50 MCG/ACT nasal spray Place 1 spray into both nostrils daily. 16 g 5   folic acid (FOLVITE) 1 MG tablet Take 1 mg by mouth daily.  2   furosemide  (LASIX) 20 MG tablet Take 2 tablets (40 mg total) by mouth daily. 60 tablet 4   glimepiride (AMARYL) 2 MG tablet Take 2 mg by mouth at bedtime.  1   metFORMIN (GLUCOPHAGE-XR) 500 MG 24 hr tablet Take 1 tablet (500 mg total) by mouth 2 (two) times daily.  1   minoxidil (LONITEN) 2.5 MG tablet Take 1.25 mg by mouth at bedtime.     nitroGLYCERIN (NITROSTAT) 0.4 MG SL tablet Place 1 tablet (0.4 mg total) under the tongue every 5 (five) minutes as needed for chest pain. 100 tablet 3   ofloxacin (OCUFLOX) 0.3 % ophthalmic solution Place 1 drop into both eyes daily as needed (Before procedure eye injections).     omeprazole (PRILOSEC) 20 MG capsule Take 2 capsules (40 mg total) by mouth daily. 30 capsule 11   polyvinyl alcohol (LIQUIFILM TEARS) 1.4 % ophthalmic solution Place 1 drop into the left eye daily as needed for dry eyes.     spironolactone (ALDACTONE) 25 MG tablet Take 12.5 mg by mouth daily.     zolpidem (AMBIEN CR) 12.5 MG CR tablet TAKE ONE TABLET BY MOUTH EVERY NIGHT AT BEDTIME AS NEEDED FOR SLEEP (Patient taking differently: Take 12.5 mg by mouth at bedtime as needed for sleep.) 30  tablet 2   donepezil (ARICEPT) 5 MG tablet Take 1 tablet (5 mg total) by mouth daily. 90 tablet 1   No current facility-administered medications for this visit.     Past Medical History:  Diagnosis Date   CAD (coronary artery disease) 09/2007   s/p CABG approximately 2004-[facility] (Cath - 17 Sep 2007:90% prox LAD with diffuse 85% narrowing to distal segment, 80% prox CXA, RCA: prox 90%, prox-mid 70%, mid 85%, distal 80%, D1 80%; Normal EF 65% --status post CABG--LIMA to LAD, SVG to PDA.     COPD (chronic obstructive pulmonary disease) (HCC)    Depression    Diabetes mellitus without complication (HCC)    Hypertension    Ischemic cardiomyopathy    Pleural effusion    S/P CABG x 2    CABG--LIMA to LAD, SVG to PDA.      ROS:   All systems reviewed and negative except as noted in the HPI.   Past Surgical  History:  Procedure Laterality Date   APPENDECTOMY     CHOLECYSTECTOMY     CORONARY ARTERY BYPASS GRAFT     GASTRIC BYPASS     LEFT HEART CATH AND CORS/GRAFTS ANGIOGRAPHY N/A 04/30/2018   Procedure: LEFT HEART CATH AND CORS/GRAFTS ANGIOGRAPHY;  Surgeon: Marykay Lex, MD;  Location: [facility];  Service: Cardiovascular;  Laterality: N/A;   LEFT HEART CATH AND CORS/GRAFTS ANGIOGRAPHY N/A 10/10/2021   Procedure: LEFT HEART CATH AND CORS/GRAFTS ANGIOGRAPHY;  Surgeon: Dolores Patty, MD;  Location: [facility];  Service: Cardiovascular;  Laterality: N/A;   RIGHT/LEFT HEART CATH AND CORONARY/GRAFT ANGIOGRAPHY N/A 02/18/2020   Procedure: RIGHT/LEFT HEART CATH AND CORONARY/GRAFT ANGIOGRAPHY;  Surgeon: Dolores Patty, MD;  Location: [facility];  Service: Cardiovascular;  Laterality: N/A;   TOE AMPUTATION       Family History  Problem Relation Age of Onset   Hypertension Father    COPD Father      Social History   Socioeconomic History   Marital status: Married    Spouse name: Not on file   Number of children: Not on file   Years of education: Not on file   Highest education level: Not on file  Occupational History   Not on file  Tobacco Use   Smoking status: Never   Smokeless tobacco: Never  Vaping Use   Vaping Use: Never used  Substance and Sexual Activity   Alcohol use: Yes    Alcohol/week: 2.0 standard drinks    Types: 2 Cans of beer per week    Comment: occ   Drug use: Never   Sexual activity: Not on file  Other Topics Concern   Not on file  Social History Narrative   Not on file   Social Determinants of Health   Financial Resource Strain: Not on file  Food Insecurity: Not on file  Transportation Needs: Not on file  Physical Activity: Not on file  Stress: Not on file  Social Connections: Not on file  Intimate Partner Violence: Not on file     BP 128/64    Pulse 99    Ht 5\' 10"  (1.778 m)    Wt 177 lb (80.3 kg)    SpO2 96%     BMI 25.40 kg/m   Physical Exam:  Well appearing NAD HEENT: Unremarkable Neck:  No JVD, no thyromegally Lymphatics:  No adenopathy Back:  No CVA tenderness Lungs:  Clear HEART:  Regular rate rhythm, no murmurs, no  rubs, no clicks Abd:  soft, positive bowel sounds, no organomegally, no rebound, no guarding Ext:  2 plus pulses, no edema, no cyanosis, no clubbing Skin:  No rashes no nodules Neuro:  CN II through XII intact, motor grossly intact  EKG - nsr  DEVICE  Normal device function.  See PaceArt for details.   Assess/Plan:  SVT - I suspect that he is having AT rather than atrial flutter. I have offered him EP study and catether ablation. He will call us if he wishes to proceed.  CAD - he denies anginal symptoms though he feels poorly when he is out of rhythm. Chronic systolic and diastolic heart failure - he has had an imrovement in his LV function. He will continue his current meds  Sharlot Gowda Giorgi Debruin,MD

## 2021-11-29 NOTE — Patient Instructions (Addendum)
Medication Instructions:  Your physician recommends that you continue on your current medications as directed. Please refer to the Current Medication list given to you today.  Labwork: None ordered.  Testing/Procedures: None ordered.  Follow-Up:  The following dates are available for an SVT ablation: January 16, 23 February 1, 3, 13, 14, 17, 20, 27  Any Other Special Instructions Will Be Listed Below (If Applicable).  If you need a refill on your cardiac medications before your next appointment, please call your pharmacy.   Cardiac Ablation Cardiac ablation is a procedure to destroy, or ablate, a small amount of heart tissue in very specific places. The heart has many electrical connections. Sometimes these connections are abnormal and can cause the heart to beat very fast or irregularly. Ablating some of the areas that cause problems can improve the heart's rhythm or return it to normal. Ablation may be done for people who: Have Wolff-Parkinson-White syndrome. Have fast heart rhythms (tachycardia). Have taken medicines for an abnormal heart rhythm (arrhythmia) that were not effective or caused side effects. Have a high-risk heartbeat that may be life-threatening. During the procedure, a small incision is made in the neck or the groin, and a long, thin tube (catheter) is inserted into the incision and moved to the heart. Small devices (electrodes) on the tip of the catheter will send out electrical currents. A type of X-ray (fluoroscopy) will be used to help guide the catheter and to provide images of the heart. Tell a health care provider about: Any allergies you have. All medicines you are taking, including vitamins, herbs, eye drops, creams, and over-the-counter medicines. Any problems you or family members have had with anesthetic medicines. Any blood disorders you have. Any surgeries you have had. Any medical conditions you have, such as kidney failure. Whether you are pregnant  or may be pregnant. What are the risks? Generally, this is a safe procedure. However, problems may occur, including: Infection. Bruising and bleeding at the catheter insertion site. Bleeding into the chest, especially into the sac that surrounds the heart. This is a serious complication. Stroke or blood clots. Damage to nearby structures or organs. Allergic reaction to medicines or dyes. Need for a permanent pacemaker if the normal electrical system is damaged. A pacemaker is a small computer that sends electrical signals to the heart and helps your heart beat normally. The procedure not being fully effective. This may not be recognized until months later. Repeat ablation procedures are sometimes done. What happens before the procedure? Medicines Ask your health care provider about: Changing or stopping your regular medicines. This is especially important if you are taking diabetes medicines or blood thinners. Taking medicines such as aspirin and ibuprofen. These medicines can thin your blood. Do not take these medicines unless your health care provider tells you to take them. Taking over-the-counter medicines, vitamins, herbs, and supplements. General instructions Follow instructions from your health care provider about eating or drinking restrictions. Plan to have someone take you home from the hospital or clinic. If you will be going home right after the procedure, plan to have someone with you for 24 hours. Ask your health care provider what steps will be taken to prevent infection. What happens during the procedure?  An IV will be inserted into one of your veins. You will be given a medicine to help you relax (sedative). The skin on your neck or groin will be numbed. An incision will be made in your neck or your groin. A needle will be  inserted through the incision and into a large vein in your neck or groin. A catheter will be inserted into the needle and moved to your heart. Dye  may be injected through the catheter to help your surgeon see the area of the heart that needs treatment. Electrical currents will be sent from the catheter to ablate heart tissue in desired areas. There are three types of energy that may be used to do this: Heat (radiofrequency energy). Laser energy. Extreme cold (cryoablation). When the tissue has been ablated, the catheter will be removed. Pressure will be held on the insertion area to prevent a lot of bleeding. A bandage (dressing) will be placed over the insertion area. The exact procedure may vary among health care providers and hospitals. What happens after the procedure? Your blood pressure, heart rate, breathing rate, and blood oxygen level will be monitored until you leave the hospital or clinic. Your insertion area will be monitored for bleeding. You will need to lie still for a few hours to ensure that you do not bleed from the insertion area. Do not drive for 24 hours or as long as told by your health care provider. Summary Cardiac ablation is a procedure to destroy, or ablate, a small amount of heart tissue using an electrical current. This procedure can improve the heart rhythm or return it to normal. Tell your health care provider about any medical conditions you may have and all medicines you are taking to treat them. This is a safe procedure, but problems may occur. Problems may include infection, bruising, damage to nearby organs or structures, or allergic reactions to medicines. Follow your health care provider's instructions about eating and drinking before the procedure. You may also be told to change or stop some of your medicines. After the procedure, do not drive for 24 hours or as long as told by your health care provider. This information is not intended to replace advice given to you by your health care provider. Make sure you discuss any questions you have with your health care provider. Document Revised: 10/05/2019  Document Reviewed: 10/05/2019 Elsevier Patient Education  2022 [employer].

## 2021-12-01 ENCOUNTER — Telehealth: Payer: Self-pay

## 2021-12-01 DIAGNOSIS — I471 Supraventricular tachycardia: Secondary | ICD-10-CM

## 2021-12-01 NOTE — Telephone Encounter (Signed)
Call returned to Pt to schedule SVT ablation.  Will send mychart message.

## 2021-12-05 NOTE — Telephone Encounter (Signed)
Pt scheduled for SVT ablation on December 25, 2021 at 7:30 am.  He will get lab work at [facility]  Work up complete.

## 2021-12-15 LAB — CBC WITH DIFFERENTIAL/PLATELET
Basophils Absolute: 0 10*3/uL (ref 0.0–0.2)
Basos: 1 %
EOS (ABSOLUTE): 0.3 10*3/uL (ref 0.0–0.4)
Eos: 4 %
Hematocrit: 43.7 % (ref 37.5–51.0)
Hemoglobin: 14 g/dL (ref 13.0–17.7)
Immature Grans (Abs): 0 10*3/uL (ref 0.0–0.1)
Immature Granulocytes: 1 %
Lymphocytes Absolute: 1.4 10*3/uL (ref 0.7–3.1)
Lymphs: 21 %
MCH: 27.7 pg (ref 26.6–33.0)
MCHC: 32 g/dL (ref 31.5–35.7)
MCV: 86 fL (ref 79–97)
Monocytes Absolute: 0.5 10*3/uL (ref 0.1–0.9)
Monocytes: 8 %
Neutrophils Absolute: 4.3 10*3/uL (ref 1.4–7.0)
Neutrophils: 65 %
Platelets: 266 10*3/uL (ref 150–450)
RBC: 5.06 x10E6/uL (ref 4.14–5.80)
RDW: 14.2 % (ref 11.6–15.4)
WBC: 6.6 10*3/uL (ref 3.4–10.8)

## 2021-12-15 LAB — BASIC METABOLIC PANEL
BUN/Creatinine Ratio: 35 — ABNORMAL HIGH (ref 10–24)
BUN: 36 mg/dL — ABNORMAL HIGH (ref 8–27)
CO2: 26 mmol/L (ref 20–29)
Calcium: 9.4 mg/dL (ref 8.6–10.2)
Chloride: 100 mmol/L (ref 96–106)
Creatinine, Ser: 1.02 mg/dL (ref 0.76–1.27)
Glucose: 192 mg/dL — ABNORMAL HIGH (ref 70–99)
Potassium: 5.6 mmol/L — ABNORMAL HIGH (ref 3.5–5.2)
Sodium: 140 mmol/L (ref 134–144)
eGFR: 78 mL/min/{1.73_m2} (ref >59)

## 2021-12-17 ENCOUNTER — Encounter: Payer: Self-pay | Admitting: Internal Medicine

## 2021-12-18 ENCOUNTER — Other Ambulatory Visit: Payer: Self-pay | Admitting: Psychiatry

## 2021-12-18 ENCOUNTER — Encounter: Payer: Self-pay | Admitting: Cardiology

## 2021-12-18 DIAGNOSIS — F5101 Primary insomnia: Secondary | ICD-10-CM

## 2021-12-22 NOTE — Pre-Procedure Instructions (Signed)
Instructed patient on the following items: °Arrival time 0530 °Nothing to eat or drink after midnight °No meds AM of procedure °Responsible person to drive you home and stay with you for 24 hrs ° ° °   °

## 2021-12-25 ENCOUNTER — Ambulatory Visit (HOSPITAL_COMMUNITY)
Admission: RE | Disposition: A | Discharge status: Home / Self Care | Payer: Self-pay | Source: Ambulatory Visit | Attending: Internal Medicine

## 2021-12-25 ENCOUNTER — Ambulatory Visit (HOSPITAL_COMMUNITY): Payer: Medicare Other

## 2021-12-25 ENCOUNTER — Ambulatory Visit (HOSPITAL_COMMUNITY)
Admission: RE | Admit: 2021-12-25 | Discharge: 2021-12-25 | Disposition: A | Discharge status: Home / Self Care | Payer: Medicare Other | Source: Ambulatory Visit | Attending: Internal Medicine | Admitting: Internal Medicine

## 2021-12-25 ENCOUNTER — Other Ambulatory Visit: Payer: Self-pay

## 2021-12-25 DIAGNOSIS — I5042 Chronic combined systolic (congestive) and diastolic (congestive) heart failure: Secondary | ICD-10-CM | POA: Insufficient documentation

## 2021-12-25 DIAGNOSIS — I442 Atrioventricular block, complete: Secondary | ICD-10-CM | POA: Insufficient documentation

## 2021-12-25 DIAGNOSIS — Z951 Presence of aortocoronary bypass graft: Secondary | ICD-10-CM | POA: Insufficient documentation

## 2021-12-25 DIAGNOSIS — I110 Hypertensive heart disease with heart failure: Secondary | ICD-10-CM | POA: Insufficient documentation

## 2021-12-25 DIAGNOSIS — I2510 Atherosclerotic heart disease of native coronary artery without angina pectoris: Secondary | ICD-10-CM | POA: Diagnosis not present

## 2021-12-25 DIAGNOSIS — I471 Supraventricular tachycardia: Secondary | ICD-10-CM | POA: Insufficient documentation

## 2021-12-25 DIAGNOSIS — Z950 Presence of cardiac pacemaker: Secondary | ICD-10-CM

## 2021-12-25 HISTORY — PX: SVT ABLATION: EP1225

## 2021-12-25 HISTORY — PX: PACEMAKER IMPLANT: EP1218

## 2021-12-25 LAB — GLUCOSE, CAPILLARY
Glucose-Capillary: 166 mg/dL — ABNORMAL HIGH (ref 70–99)
Glucose-Capillary: 184 mg/dL — ABNORMAL HIGH (ref 70–99)

## 2021-12-25 LAB — BASIC METABOLIC PANEL
Anion gap: 7 (ref 5–15)
BUN: 30 mg/dL — ABNORMAL HIGH (ref 8–23)
CO2: 25 mmol/L (ref 22–32)
Calcium: 8.7 mg/dL — ABNORMAL LOW (ref 8.9–10.3)
Chloride: 105 mmol/L (ref 98–111)
Creatinine, Ser: 0.93 mg/dL (ref 0.61–1.24)
GFR, Estimated: >60 mL/min (ref >60)
Glucose, Bld: 172 mg/dL — ABNORMAL HIGH (ref 70–99)
Potassium: 5 mmol/L (ref 3.5–5.1)
Sodium: 137 mmol/L (ref 135–145)

## 2021-12-25 SURGERY — SVT ABLATION

## 2021-12-25 MED ORDER — METOPROLOL TARTRATE 5 MG/5ML IV SOLN
INTRAVENOUS | Status: AC
  Filled 2021-12-25: qty 5

## 2021-12-25 MED ORDER — CEFAZOLIN SODIUM-DEXTROSE 2-3 GM-%(50ML) IV SOLR
INTRAVENOUS | Status: DC | PRN
  Administered 2021-12-25: 2 g via INTRAVENOUS

## 2021-12-25 MED ORDER — FENTANYL CITRATE (PF) 100 MCG/2ML IJ SOLN
INTRAMUSCULAR | Status: AC
  Filled 2021-12-25: qty 2

## 2021-12-25 MED ORDER — CEFAZOLIN SODIUM-DEXTROSE 2-4 GM/100ML-% IV SOLN
INTRAVENOUS | Status: AC
  Filled 2021-12-25: qty 100

## 2021-12-25 MED ORDER — HEPARIN (PORCINE) IN NACL 1000-0.9 UT/500ML-% IV SOLN
INTRAVENOUS | Status: AC
  Filled 2021-12-25: qty 500

## 2021-12-25 MED ORDER — BUPIVACAINE HCL (PF) 0.25 % IJ SOLN
INTRAMUSCULAR | Status: AC
  Filled 2021-12-25: qty 60

## 2021-12-25 MED ORDER — LIDOCAINE HCL (PF) 1 % IJ SOLN
INTRAMUSCULAR | Status: DC | PRN
  Administered 2021-12-25: 60 mL

## 2021-12-25 MED ORDER — HEPARIN (PORCINE) IN NACL 1000-0.9 UT/500ML-% IV SOLN
INTRAVENOUS | Status: DC | PRN
  Administered 2021-12-25 (×2): 500 mL

## 2021-12-25 MED ORDER — ACETAMINOPHEN 325 MG PO TABS: 325.0000 mg | ORAL_TABLET | ORAL | Status: DC | PRN

## 2021-12-25 MED ORDER — SODIUM CHLORIDE 0.9 % IV SOLN
INTRAVENOUS | Status: DC | PRN
  Administered 2021-12-25: 80 mg

## 2021-12-25 MED ORDER — FENTANYL CITRATE (PF) 100 MCG/2ML IJ SOLN
INTRAMUSCULAR | Status: DC | PRN
  Administered 2021-12-25 (×2): 12.5 ug via INTRAVENOUS
  Administered 2021-12-25: 25 ug via INTRAVENOUS
  Administered 2021-12-25 (×3): 12.5 ug via INTRAVENOUS
  Administered 2021-12-25: 25 ug via INTRAVENOUS
  Administered 2021-12-25: 12.5 ug via INTRAVENOUS

## 2021-12-25 MED ORDER — BUPIVACAINE HCL (PF) 0.25 % IJ SOLN
INTRAMUSCULAR | Status: DC | PRN
  Administered 2021-12-25: 60 mL

## 2021-12-25 MED ORDER — MIDAZOLAM HCL 5 MG/5ML IJ SOLN
INTRAMUSCULAR | Status: DC | PRN
  Administered 2021-12-25 (×7): 1 mg via INTRAVENOUS
  Administered 2021-12-25: 2 mg via INTRAVENOUS

## 2021-12-25 MED ORDER — SODIUM CHLORIDE 0.9 % IV SOLN
INTRAVENOUS | Status: AC
  Filled 2021-12-25: qty 2

## 2021-12-25 MED ORDER — MIDAZOLAM HCL 5 MG/5ML IJ SOLN
INTRAMUSCULAR | Status: AC
  Filled 2021-12-25: qty 5

## 2021-12-25 MED ORDER — LIDOCAINE HCL 1 % IJ SOLN
INTRAMUSCULAR | Status: AC
  Filled 2021-12-25: qty 40

## 2021-12-25 MED ORDER — METOPROLOL TARTRATE 5 MG/5ML IV SOLN
INTRAVENOUS | Status: DC | PRN
  Administered 2021-12-25: 5 mg
  Administered 2021-12-25: 5 mg via INTRAVENOUS

## 2021-12-25 MED ORDER — CEFAZOLIN SODIUM-DEXTROSE 1-4 GM/50ML-% IV SOLN
1.0000 g | Freq: Once | INTRAVENOUS | Status: AC
  Administered 2021-12-25: 1 g via INTRAVENOUS
  Filled 2021-12-25: qty 50

## 2021-12-25 MED ORDER — LIDOCAINE HCL 1 % IJ SOLN
INTRAMUSCULAR | Status: AC
  Filled 2021-12-25: qty 20

## 2021-12-25 MED ORDER — SODIUM CHLORIDE 0.9 % IV SOLN: INTRAVENOUS | Status: DC

## 2021-12-25 MED ORDER — SODIUM CHLORIDE 0.9 % IV SOLN
INTRAVENOUS | Status: DC | PRN
  Administered 2021-12-25: 250 mL via INTRAVENOUS

## 2021-12-25 SURGICAL SUPPLY — 22 items
CATH EZ STEER NAV 4MM D-F CUR (ABLATOR) ×1 IMPLANT
CATH HEX JOS 2-5-2 65CM 6F REP (CATHETERS) ×1 IMPLANT
CATH JOSEPH QUAD ALLRED 6F REP (CATHETERS) ×2 IMPLANT
CATH POLARIS X REPROCESSED (CATHETERS) ×1 IMPLANT
CATH RIGHTSITE C315HIS02 (CATHETERS) ×1 IMPLANT
CATH WEB BI DIR CSDF CRV REPRO (CATHETERS) ×1 IMPLANT
IPG PACE AZUR XT DR MRI W1DR01 (Pacemaker) IMPLANT
LEAD CAPSURE NOVUS 5076-52CM (Lead) ×1 IMPLANT
LEAD SELECT SECURE 3830 383069 (Lead) IMPLANT
PACE AZURE XT DR MRI W1DR01 (Pacemaker) ×2 IMPLANT
PACK EP LATEX FREE (CUSTOM PROCEDURE TRAY) ×2
PACK EP LF (CUSTOM PROCEDURE TRAY) ×1 IMPLANT
PAD DEFIB RADIO PHYSIO CONN (PAD) ×2 IMPLANT
SELECT SECURE 3830 383069 (Lead) ×2 IMPLANT
SHEATH 7FR PRELUDE SNAP 13 (SHEATH) ×2 IMPLANT
SHEATH PINNACLE 6F 10CM (SHEATH) ×2 IMPLANT
SHEATH PINNACLE 7F 10CM (SHEATH) ×2 IMPLANT
SHEATH PINNACLE 8F 10CM (SHEATH) ×1 IMPLANT
SLITTER 6232ADJ (MISCELLANEOUS) ×1 IMPLANT
TRAY PACEMAKER INSERTION (PACKS) ×1 IMPLANT
WIRE HI TORQ VERSACORE-J 145CM (WIRE) ×1 IMPLANT
WIRE MICRO SET SILHO 5FR 7 (SHEATH) ×1 IMPLANT

## 2021-12-25 NOTE — Discharge Instructions (Addendum)
Post procedure GROIN care instructions Keep procedure site clean & dry. If you notice increased pain, swelling, bleeding or pus, call/return!  Please follow activity instructions below      Supplemental Discharge Instructions for  Pacemaker/Defibrillator Patients  Tomorrow, 12/26/21, send in a device transmission  Activity No heavy lifting or vigorous activity with your left/right arm for 6 to 8 weeks.  Do not raise your left/right arm above your head for one week.  Gradually raise your affected arm as drawn below.              12/30/21                   12/31/21                    01/01/22                  01/02/22 __  NO DRIVING for   1 week  ; you may begin driving on   0/09/23  .  WOUND CARE Keep the wound area clean and dry.  Do not get this area wet , no showers for one week; you may shower on  01/02/22   . Tomorrow, 12/26/21, remove the arm sling Tomorrow, 12/26/21 remove the LARGE outer plastic bandage.  Underneath the plastic bandage there are steri strips (paper tapes), DO NOT remove these. The tape/steri-strips on your wound will fall off; do not pull them off.  No bandage is needed on the site.  DO  NOT apply any creams, oils, or ointments to the wound area. If you notice any drainage or discharge from the wound, any swelling or bruising at the site, or you develop a fever > 101? F after you are discharged home, call the office at once.  Special Instructions You are still able to use cellular telephones; use the ear opposite the side where you have your pacemaker/defibrillator.  Avoid carrying your cellular phone near your device. When traveling through airports, show security personnel your identification card to avoid being screened in the metal detectors.  Ask the security personnel to use the hand wand. Avoid arc welding equipment, MRI testing (magnetic resonance imaging), TENS units (transcutaneous nerve stimulators).  Call the office for questions about other devices. Avoid  electrical appliances that are in poor condition or are not properly grounded. Microwave ovens are safe to be near or to operate.

## 2021-12-25 NOTE — Progress Notes (Signed)
Site area: Right Internal Jugular a 7 french venous sheath was removed  Site Prior to Removal:  Level 0  Pressure Applied For 5 MINUTES    Bedrest Beginning at 1220p  Manual:   Yes.    Patient Status During Pull:  stable  Post Pull Groin Site:  Level 0  Post Pull Instructions Given:  Yes.    Post Pull Pulses Present:  Yes.    Dressing Applied:  Yes.    Comments:

## 2021-12-25 NOTE — Interval H&P Note (Signed)
History and Physical Interval Note:  12/25/2021 7:28 AM  Alec Taylor  has presented today for surgery, with the diagnosis of svt.  The various methods of treatment have been discussed with the patient and family. After consideration of risks, benefits and other options for treatment, the patient has consented to  Procedure(s): SVT ABLATION (N/A) as a surgical intervention.  The patient's history has been reviewed, patient examined, no change in status, stable for surgery.  I have reviewed the patient's chart and labs.  Questions were answered to the patient's satisfaction.     [name]

## 2021-12-25 NOTE — Progress Notes (Signed)
Site area: Right groin a 7, and 8 french venous sheaths X 2 was removed  Site Prior to Removal:  Level 0  Pressure Applied For 20 MINUTES    Bedrest Beginning at 1220pm X 6 hours  Manual:   Yes.    Patient Status During Pull:  stable   Post Pull Groin Site:  Level 0  Post Pull Instructions Given:  Yes.    Post Pull Pulses Present:  Yes.    Dressing Applied:  Yes.    Comments:

## 2021-12-26 ENCOUNTER — Telehealth: Payer: Self-pay

## 2021-12-26 ENCOUNTER — Encounter (HOSPITAL_COMMUNITY): Payer: Self-pay | Admitting: Internal Medicine

## 2021-12-26 NOTE — Telephone Encounter (Signed)
LVM for patient to call device back regarding instructions following PPM implant direct number to device clinic given

## 2021-12-26 NOTE — Telephone Encounter (Signed)
-----   Message from Sheilah Pigeon, [place] sent at 12/25/2021  1:20 PM EST -----  Same day d/c today PPM MDT  GT

## 2021-12-27 ENCOUNTER — Telehealth: Payer: Self-pay | Admitting: Internal Medicine

## 2021-12-27 MED FILL — Cefazolin Sodium-Dextrose IV Solution 2 GM/100ML-4%: INTRAVENOUS | Qty: 100 | Status: AC

## 2021-12-27 MED FILL — Gentamicin Sulfate Inj 40 MG/ML: INTRAMUSCULAR | Qty: 80 | Status: AC

## 2021-12-27 NOTE — Telephone Encounter (Signed)
Called patient for same day d/c. No answer, LMTCB.

## 2021-12-27 NOTE — Telephone Encounter (Signed)
New message    Pt wife is calling, RS wound check d/t being out of town on scheduled day. They would like to talk about the bandage that is on there and when they can take it off. Please call.

## 2021-12-27 NOTE — Telephone Encounter (Signed)
Returned phone call.  Device was implanted on 12/25/21. Advised the outer dressing can come off today. Do not remove the steri-strips in place under. Also advised not to allow water to get on steri-strips, keep them completely dry and in place until device clinic apt. Verbalized understanding. Appreciative of follow up call.

## 2021-12-29 NOTE — Telephone Encounter (Signed)
Spoke with patient regarding PPM implant discharge instructions, patient stated that he was doing well, he had taken clear bandage off, steri-strips were intact patient denied edema or drainage at site, patient voiced understanding of appointment on 01/10/21 at 11:40. Patient voiced understanding of lifting restrictions.

## 2022-01-04 ENCOUNTER — Ambulatory Visit: Payer: Medicare Other

## 2022-01-04 ENCOUNTER — Ambulatory Visit: Payer: Medicare Other | Admitting: Internal Medicine

## 2022-01-10 ENCOUNTER — Ambulatory Visit (INDEPENDENT_AMBULATORY_CARE_PROVIDER_SITE_OTHER): Payer: Medicare Other | Admitting: Student

## 2022-01-10 ENCOUNTER — Encounter: Payer: Self-pay | Admitting: Student

## 2022-01-10 ENCOUNTER — Other Ambulatory Visit: Payer: Self-pay

## 2022-01-10 VITALS — BP 126/64 | HR 89 | Ht 70.0 in | Wt 180.0 lb

## 2022-01-10 DIAGNOSIS — I442 Atrioventricular block, complete: Secondary | ICD-10-CM

## 2022-01-10 DIAGNOSIS — I471 Supraventricular tachycardia: Secondary | ICD-10-CM

## 2022-01-10 LAB — CUP PACEART INCLINIC DEVICE CHECK
Battery Remaining Longevity: 139 mo
Battery Voltage: 3.2 V
Brady Statistic AP VP Percent: 2.95 %
Brady Statistic AP VS Percent: 0 %
Brady Statistic AS VP Percent: 96.1 %
Brady Statistic AS VS Percent: 0.95 %
Brady Statistic RA Percent Paced: 2.93 %
Brady Statistic RV Percent Paced: 99.05 %
Date Time Interrogation Session: 20230201120344
Implantable Lead Implant Date: 20230116
Implantable Lead Implant Date: 20230116
Implantable Lead Location: 753859
Implantable Lead Location: 753860
Implantable Lead Model: 3830
Implantable Lead Model: 5076
Implantable Pulse Generator Implant Date: 20230116
Lead Channel Impedance Value: 304 Ohm
Lead Channel Impedance Value: 399 Ohm
Lead Channel Impedance Value: 570 Ohm
Lead Channel Impedance Value: 570 Ohm
Lead Channel Pacing Threshold Amplitude: 0.5 V
Lead Channel Pacing Threshold Amplitude: 1 V
Lead Channel Pacing Threshold Pulse Width: 0.4 ms
Lead Channel Pacing Threshold Pulse Width: 0.4 ms
Lead Channel Sensing Intrinsic Amplitude: 19.125 mV
Lead Channel Sensing Intrinsic Amplitude: 20.5 mV
Lead Channel Sensing Intrinsic Amplitude: 3.375 mV
Lead Channel Sensing Intrinsic Amplitude: 4.25 mV
Lead Channel Setting Pacing Amplitude: 3.5 V
Lead Channel Setting Pacing Amplitude: 3.5 V
Lead Channel Setting Pacing Pulse Width: 0.4 ms
Lead Channel Setting Sensing Sensitivity: 1.2 mV

## 2022-01-10 NOTE — Patient Instructions (Signed)

## 2022-01-10 NOTE — Progress Notes (Signed)
Electrophysiology Office Note Date: 01/10/2022  ID:  Alec Taylor, DOB 1946-06-24, MRN [id number]  PCP: Alec Dory, DO Primary Cardiologist: Alec Bollman, MD Electrophysiologist: Dr. Ladona Taylor  CC: Pacemaker follow-up  Alec Taylor is a 76 y.o. male seen today for [name], MD for routine electrophysiology followup.  Since discharge from hospital the patient reports doing very well.  he denies chest pain, palpitations, dyspnea, PND, orthopnea, nausea, vomiting, dizziness, syncope, edema, weight gain, or early satiety.  Device History: Medtronic Dual Chamber PPM implanted 12/25/2021 for CHB/2:1 AV block s/p SVT ablation  Past Medical History:  Diagnosis Date   CAD (coronary artery disease) 09/2007   s/p CABG approximately 2004-HP (Cath - 17 Sep 2007:90% prox LAD with diffuse 85% narrowing to distal segment, 80% prox CXA, RCA: prox 90%, prox-mid 70%, mid 85%, distal 80%, D1 80%; Normal EF 65% --status post CABG--LIMA to LAD, SVG to PDA.     COPD (chronic obstructive pulmonary disease) (HCC)    Depression    Diabetes mellitus without complication (HCC)    Hypertension    Ischemic cardiomyopathy    Pleural effusion    S/P CABG x 2    CABG--LIMA to LAD, SVG to PDA.     Past Surgical History:  Procedure Laterality Date   APPENDECTOMY     CHOLECYSTECTOMY     CORONARY ARTERY BYPASS GRAFT     GASTRIC BYPASS     LEFT HEART CATH AND CORS/GRAFTS ANGIOGRAPHY N/A 04/30/2018   Procedure: LEFT HEART CATH AND CORS/GRAFTS ANGIOGRAPHY;  Surgeon: Marykay Lex, MD;  Location: [facility] INVASIVE CV LAB;  Service: Cardiovascular;  Laterality: N/A;   LEFT HEART CATH AND CORS/GRAFTS ANGIOGRAPHY N/A 10/10/2021   Procedure: LEFT HEART CATH AND CORS/GRAFTS ANGIOGRAPHY;  Surgeon: Dolores Patty, MD;  Location: [facility] INVASIVE CV LAB;  Service: Cardiovascular;  Laterality: N/A;   PACEMAKER IMPLANT N/A 12/25/2021   Procedure: PACEMAKER IMPLANT;  Surgeon: Marinus Maw, MD;  Location: [facility]  INVASIVE CV LAB;  Service: Cardiovascular;  Laterality: N/A;   RIGHT/LEFT HEART CATH AND CORONARY/GRAFT ANGIOGRAPHY N/A 02/18/2020   Procedure: RIGHT/LEFT HEART CATH AND CORONARY/GRAFT ANGIOGRAPHY;  Surgeon: Dolores Patty, MD;  Location: [facility] INVASIVE CV LAB;  Service: Cardiovascular;  Laterality: N/A;   SVT ABLATION N/A 12/25/2021   Procedure: SVT ABLATION;  Surgeon: Marinus Maw, MD;  Location: [facility] INVASIVE CV LAB;  Service: Cardiovascular;  Laterality: N/A;   TOE AMPUTATION      Current Outpatient Medications  Medication Sig Dispense Refill   ALPRAZolam (XANAX) 0.25 MG tablet TAKE 1/2 TO 1 TABLET BY MOUTH DAILY AS NEEDED FOR ANXIETY 30 tablet 1   aspirin EC 81 MG tablet Take 81 mg by mouth at bedtime.      atorvastatin (LIPITOR) 20 MG tablet Take 1 tablet (20 mg total) by mouth daily. Please make an appointment for further refills (Patient taking differently: Take 20 mg by mouth at bedtime.) 30 tablet 6   buPROPion (WELLBUTRIN XL) 150 MG 24 hr tablet Take 1 tablet (150 mg total) by mouth daily. 90 tablet 1   carvedilol (COREG) 12.5 MG tablet Take 1.5 tablets (18.75 mg total) by mouth 2 (two) times daily with a meal. (Patient taking differently: Take 12.5 mg by mouth 2 (two) times daily with a meal.) 60 tablet 5   Cholecalciferol (VITAMIN D3) 5000 units CAPS Take 5,000 Units by mouth daily.      DULoxetine (CYMBALTA) 60 MG capsule Take 60 mg by mouth daily.  empagliflozin (JARDIANCE) 10 MG TABS tablet Take 1 tablet (10 mg total) by mouth daily. Please make an appointment for refills (Patient taking differently: Take 10 mg by mouth daily.) 90 tablet 3   finasteride (PROSCAR) 5 MG tablet Take 5 mg by mouth at bedtime.  6   fluticasone (FLONASE) 50 MCG/ACT nasal spray Place 1 spray into both nostrils daily. 16 g 5   folic acid (FOLVITE) 1 MG tablet Take 1 mg by mouth daily.  2   furosemide (LASIX) 20 MG tablet Take 2 tablets (40 mg total) by mouth daily. (Patient taking differently: Take  20 mg by mouth daily.) 60 tablet 4   glimepiride (AMARYL) 2 MG tablet Take 2 mg by mouth at bedtime.  1   metFORMIN (GLUCOPHAGE-XR) 500 MG 24 hr tablet Take 1 tablet (500 mg total) by mouth 2 (two) times daily.  1   minoxidil (LONITEN) 2.5 MG tablet Take 1.25 mg by mouth at bedtime.     nitroGLYCERIN (NITROSTAT) 0.4 MG SL tablet Place 1 tablet (0.4 mg total) under the tongue every 5 (five) minutes as needed for chest pain. 100 tablet 3   ofloxacin (OCUFLOX) 0.3 % ophthalmic solution Place 1 drop into both eyes daily as needed (Before and after eye injections).     omeprazole (PRILOSEC) 20 MG capsule Take 2 capsules (40 mg total) by mouth daily. (Patient taking differently: Take 20 mg by mouth 2 (two) times daily.) 30 capsule 11   polyvinyl alcohol (LIQUIFILM TEARS) 1.4 % ophthalmic solution Place 1 drop into the left eye daily as needed for dry eyes.     spironolactone (ALDACTONE) 25 MG tablet Take 12.5 mg by mouth daily.     zolpidem (AMBIEN CR) 12.5 MG CR tablet TAKE ONE TABLET BY MOUTH EVERY NIGHT AT BEDTIME AS NEEDED FOR SLEEP 30 tablet 0   donepezil (ARICEPT) 5 MG tablet Take 1 tablet (5 mg total) by mouth daily. 90 tablet 1   No current facility-administered medications for this visit.    Allergies:   Celecoxib, Losartan, and Ramipril   Social History: Social History   Socioeconomic History   Marital status: Married    Spouse name: Not on file   Number of children: Not on file   Years of education: Not on file   Highest education level: Not on file  Occupational History   Not on file  Tobacco Use   Smoking status: Never   Smokeless tobacco: Never  Vaping Use   Vaping Use: Never used  Substance and Sexual Activity   Alcohol use: Yes    Alcohol/week: 2.0 standard drinks    Types: 2 Cans of beer per week    Comment: occ   Drug use: Never   Sexual activity: Not on file  Other Topics Concern   Not on file  Social History Narrative   Not on file   Social Determinants of  Health   Financial Resource Strain: Not on file  Food Insecurity: Not on file  Transportation Needs: Not on file  Physical Activity: Not on file  Stress: Not on file  Social Connections: Not on file  Intimate Partner Violence: Not on file    Family History: Family History  Problem Relation Age of Onset   Hypertension Father    COPD Father      Review of Systems: All other systems reviewed and are otherwise negative except as noted above.  Physical Exam: Vitals:   01/10/22 1139  BP: 126/64  Pulse: 89  SpO2: 95%  Weight: 180 lb (81.6 kg)  Height: 5\' 10"  (1.778 m)     GEN- The patient is well appearing, alert and oriented x 3 today.   HEENT: normocephalic, atraumatic; sclera clear, conjunctiva pink; hearing intact; oropharynx clear; neck supple  Lungs- Clear to ausculation bilaterally, normal work of breathing.  No wheezes, rales, rhonchi Heart- Regular rate and rhythm, no murmurs, rubs or gallops  GI- soft, non-tender, non-distended, bowel sounds present  Extremities- no clubbing or cyanosis. No edema MS- no significant deformity or atrophy Skin- warm and dry, no rash or lesion; PPM pocket well healed Psych- euthymic mood, full affect Neuro- strength and sensation are intact  PPM Interrogation- reviewed in detail today,  See PACEART report  EKG:  EKG is not ordered today.  Recent Labs: 10/09/2021: B Natriuretic Peptide 580.7; TSH 3.793 10/23/2021: ALT 64 12/14/2021: Hemoglobin 14.0; Platelets 266 12/25/2021: BUN 30; Creatinine, Ser 0.93; Potassium 5.0; Sodium 137   Wt Readings from Last 3 Encounters:  01/10/22 180 lb (81.6 kg)  12/25/21 175 lb (79.4 kg)  11/29/21 177 lb (80.3 kg)     Other studies Reviewed: Additional studies/ records that were reviewed today include: Previous EP office notes, Previous remote checks, Most recent labwork.   Assessment and Plan:  1. CHB s/p Medtronic PPM  Normal PPM function See Pace Art report No changes  today Steri-strips removed. Wound stable.   2. SVT No further s/p ablation.   Current medicines are reviewed at length with the patient today.    Disposition:   Follow up with Dr. Ladona Ridgelaylor as scheduled for post pacemaker f/u.    Dustin FlockSigned, Margrett Kalb Andrew Hilda Wexler, PA-C  01/10/2022 12:07 PM  [facility] [address] [place] [address] [phone] (office) [phone] (fax)

## 2022-01-12 ENCOUNTER — Other Ambulatory Visit (HOSPITAL_COMMUNITY): Payer: Self-pay | Admitting: Internal Medicine

## 2022-01-12 ENCOUNTER — Other Ambulatory Visit: Payer: Self-pay | Admitting: Psychiatry

## 2022-01-15 ENCOUNTER — Other Ambulatory Visit: Payer: Self-pay | Admitting: Psychiatry

## 2022-01-15 DIAGNOSIS — F5101 Primary insomnia: Secondary | ICD-10-CM

## 2022-01-15 NOTE — Telephone Encounter (Signed)
Please call to schedule an appt. Last seen 8/8 with RTC in 3 mo.

## 2022-01-16 NOTE — Telephone Encounter (Signed)
Left message for pt to call and schedule

## 2022-01-16 NOTE — Telephone Encounter (Signed)
Last seen 8/8 due back 11/5.filled on 1/10

## 2022-01-16 NOTE — Telephone Encounter (Signed)
Received refill request for patient. Patient is overdue for follow-up. Please call to schedule follow-up appointment.  

## 2022-01-19 ENCOUNTER — Ambulatory Visit (INDEPENDENT_AMBULATORY_CARE_PROVIDER_SITE_OTHER): Payer: Medicare Other | Admitting: Internal Medicine

## 2022-01-19 ENCOUNTER — Other Ambulatory Visit: Payer: Self-pay

## 2022-01-19 ENCOUNTER — Encounter: Payer: Self-pay | Admitting: Internal Medicine

## 2022-01-19 VITALS — BP 134/70 | HR 83 | Temp 98.4°F | Ht 70.0 in | Wt 185.0 lb

## 2022-01-19 DIAGNOSIS — J984 Other disorders of lung: Secondary | ICD-10-CM | POA: Diagnosis not present

## 2022-01-19 DIAGNOSIS — R0602 Shortness of breath: Secondary | ICD-10-CM

## 2022-01-19 DIAGNOSIS — K219 Gastro-esophageal reflux disease without esophagitis: Secondary | ICD-10-CM

## 2022-01-19 LAB — PULMONARY FUNCTION TEST
DL/VA % pred: 105 %
DL/VA: 4.23 ml/min/mmHg/L
DLCO cor % pred: 60 %
DLCO cor: 15.46 ml/min/mmHg
DLCO unc % pred: 59 %
DLCO unc: 15.19 ml/min/mmHg
FEF 25-75 Post: 0.88 L/sec
FEF 25-75 Pre: 0.77 L/sec
FEF2575-%Change-Post: 14 %
FEF2575-%Pred-Post: 37 %
FEF2575-%Pred-Pre: 33 %
FEV1-%Change-Post: 2 %
FEV1-%Pred-Post: 40 %
FEV1-%Pred-Pre: 39 %
FEV1-Post: 1.26 L
FEV1-Pre: 1.23 L
FEV1FVC-%Change-Post: 4 %
FEV1FVC-%Pred-Pre: 96 %
FEV6-%Change-Post: -1 %
FEV6-%Pred-Post: 42 %
FEV6-%Pred-Pre: 43 %
FEV6-Post: 1.72 L
FEV6-Pre: 1.75 L
FEV6FVC-%Pred-Post: 106 %
FEV6FVC-%Pred-Pre: 106 %
FVC-%Change-Post: -1 %
FVC-%Pred-Post: 39 %
FVC-%Pred-Pre: 40 %
FVC-Post: 1.72 L
FVC-Pre: 1.75 L
Post FEV1/FVC ratio: 74 %
Post FEV6/FVC ratio: 100 %
Pre FEV1/FVC ratio: 71 %
Pre FEV6/FVC Ratio: 100 %
RV % pred: 143 %
RV: 3.61 L
TLC % pred: 75 %
TLC: 5.33 L

## 2022-01-19 NOTE — Patient Instructions (Addendum)
Follow up with me as needed.  I will contact you with the results of your CT scan.  Our office will schedule this for you and call you.   You can drop down your omeprazole to once a day and see if it still helps your cough.

## 2022-01-19 NOTE — Patient Instructions (Signed)
Full PFT performed today. °

## 2022-01-19 NOTE — Progress Notes (Signed)
Alec Taylor    [id number]    [birth year]  Primary Care Physician:Wendling, Jilda Roche, DO Date of Appointment: 01/19/2022 Established Patient Visit  Chief complaint:   Chief Complaint  Patient presents with   Follow-up    SOB improved      HPI: Alec Taylor is a 76 y.o. man with CAD and OSA as well as chronic cough.   Interval Updates: Here for follow up after PFTS and a trial of PPI for cough. In the interim he has had an SVT ablation and PPM placement in Jan 2023. Shortness of breath is the same, but cough is better. PFTS show restriction to ventilation and reduced DLCO.    Short of breath with daily activities, without chest tightness, wheezing, chest pain. Feels fatigued.  No improvement with SVT ablation and PPM.   I have reviewed the patient's family social and past medical history and updated as appropriate.   Past Medical History:  Diagnosis Date   CAD (coronary artery disease) 09/2007   s/p CABG approximately 2004-HP (Cath - 17 Sep 2007:90% prox LAD with diffuse 85% narrowing to distal segment, 80% prox CXA, RCA: prox 90%, prox-mid 70%, mid 85%, distal 80%, D1 80%; Normal EF 65% --status post CABG--LIMA to LAD, SVG to PDA.     COPD (chronic obstructive pulmonary disease) (HCC)    Depression    Diabetes mellitus without complication (HCC)    Hypertension    Ischemic cardiomyopathy    Pleural effusion    S/P CABG x 2    CABG--LIMA to LAD, SVG to PDA.      Past Surgical History:  Procedure Laterality Date   APPENDECTOMY     CHOLECYSTECTOMY     CORONARY ARTERY BYPASS GRAFT     GASTRIC BYPASS     LEFT HEART CATH AND CORS/GRAFTS ANGIOGRAPHY N/A 04/30/2018   Procedure: LEFT HEART CATH AND CORS/GRAFTS ANGIOGRAPHY;  Surgeon: Marykay Lex, MD;  Location: [facility] INVASIVE CV LAB;  Service: Cardiovascular;  Laterality: N/A;   LEFT HEART CATH AND CORS/GRAFTS ANGIOGRAPHY N/A 10/10/2021   Procedure: LEFT HEART CATH AND CORS/GRAFTS ANGIOGRAPHY;  Surgeon:  Dolores Patty, MD;  Location: [facility] INVASIVE CV LAB;  Service: Cardiovascular;  Laterality: N/A;   PACEMAKER IMPLANT N/A 12/25/2021   Procedure: PACEMAKER IMPLANT;  Surgeon: Marinus Maw, MD;  Location: [facility] INVASIVE CV LAB;  Service: Cardiovascular;  Laterality: N/A;   RIGHT/LEFT HEART CATH AND CORONARY/GRAFT ANGIOGRAPHY N/A 02/18/2020   Procedure: RIGHT/LEFT HEART CATH AND CORONARY/GRAFT ANGIOGRAPHY;  Surgeon: Dolores Patty, MD;  Location: [facility] INVASIVE CV LAB;  Service: Cardiovascular;  Laterality: N/A;   SVT ABLATION N/A 12/25/2021   Procedure: SVT ABLATION;  Surgeon: Marinus Maw, MD;  Location: [facility] INVASIVE CV LAB;  Service: Cardiovascular;  Laterality: N/A;   TOE AMPUTATION      Family History  Problem Relation Age of Onset   Hypertension Father    COPD Father     Social History   Occupational History   Not on file  Tobacco Use   Smoking status: Never   Smokeless tobacco: Never  Vaping Use   Vaping Use: Never used  Substance and Sexual Activity   Alcohol use: Yes    Alcohol/week: 2.0 standard drinks    Types: 2 Cans of beer per week    Comment: occ   Drug use: Never   Sexual activity: Not on file     Physical Exam: Blood pressure 134/70,  pulse 83, temperature 98.4 F (36.9 C), temperature source Oral, height 5\' 10"  (1.778 m), weight 185 lb (83.9 kg), SpO2 96 %.  Gen:      No acute distress ENT:  no nasal polyps, mucus membranes moist Lungs:    No increased respiratory effort, symmetric chest wall excursion, clear to auscultation bilaterally, no wheezes or crackles CV:         Regular rate and rhythm; no murmurs, rubs, or gallops.  No pedal edema   Data Reviewed: Imaging: I have personally reviewed the chest xray Jan 2023 - no evidence of ILD here or on the CT scan from 2019  PFTs:  PFT Results Latest Ref Rng & Units 01/19/2022  FVC-Pre L 1.75  FVC-Predicted Pre % 40  FVC-Post L 1.72  FVC-Predicted Post % 39  Pre FEV1/FVC % % 71  Post FEV1/FCV % %  74  FEV1-Pre L 1.23  FEV1-Predicted Pre % 39  FEV1-Post L 1.26  DLCO uncorrected ml/min/mmHg 15.19  DLCO UNC% % 59  DLCO corrected ml/min/mmHg 15.46  DLCO COR %Predicted % 60  DLVA Predicted % 105  TLC L 5.33  TLC % Predicted % 75  RV % Predicted % 143   I have personally reviewed the patient's PFTs and restriction to ventilation with reduced diffusion capacity. No airflow limitation  Labs: Lab Results  Component Value Date   WBC 6.6 12/14/2021   HGB 14.0 12/14/2021   HCT 43.7 12/14/2021   MCV 86 12/14/2021   PLT 266 12/14/2021   Lab Results  Component Value Date   NA 137 12/25/2021   K 5.0 12/25/2021   CL 105 12/25/2021   CO2 25 12/25/2021     Immunization status: Immunization History  Administered Date(s) Administered   Fluad Quad(high Dose 65+) 10/26/2021   Hepatitis A 11/30/2013   Hepatitis A, Adult 11/30/2013, 11/30/2013, 01/04/2014   Hepatitis A, Ped/Adol-2 Dose 11/30/2013   Hepatitis B, adult 11/30/2013, 01/04/2014   Influenza, High Dose Seasonal PF 10/29/2018   Influenza, Quadrivalent, Recombinant, Inj, Pf 09/05/2017   Influenza, Seasonal, Injecte, Preservative Fre 10/22/2011   Influenza,inj,Quad PF,6+ Mos 08/21/2012, 08/17/2015, 10/04/2016, 09/05/2017   Influenza,inj,quad, With Preservative 11/08/2014   Influenza-Unspecified 10/09/2006, 12/15/2007, 10/24/2009, 09/05/2010, 08/07/2013   PFIZER(Purple Top)SARS-COV-2 Vaccination 12/31/2019, 01/21/2020   Pneumococcal Conjugate-13 11/10/2013, 11/08/2014   Pneumococcal Polysaccharide-23 06/17/2008, 11/22/2015   Pneumococcal-Unspecified 06/17/2008, 11/10/2013, 11/22/2015   Td 12/10/2002   Tdap 04/01/2008, 04/01/2008, 07/14/2019, 12/10/2020   Zoster Recombinat (Shingrix) 08/07/2013   Zoster, Live 08/07/2013   Zoster, Unspecified 08/07/2013    External Records Personally Reviewed: hospital stay, cardiology appt  Assessment:  Shortness of breath PFTs - restriction to ventilation with reduced DLCO Chronic  cough related to GERD  Plan/Recommendations: Still has shortness of breath. Given pattern on PFTs, will obtain high res CT Chest to evaluate for intrinsic ILD. I would be very surprised if he had this given his reassuring exam and recent chest xray.  I wonder if he has chronotropic incompetence from being on beta blocker ( although its a low dose of coreg.) I'll review the results of his CT Chest - if normal would consider CPET? If abnormal will have him come back in to discuss next steps.  Chronic aspiration is a consideration given his GERD. Continue omeprazole for GERD but can drop down to once a day.    Return to Care: Return if symptoms worsen or fail to improve.   Durel SaltsNikita Diannah Rindfleisch, MD Pulmonary and Critical Care Medicine [facility] Office:[phone]

## 2022-01-19 NOTE — Progress Notes (Signed)
Full PFT performed today. °

## 2022-01-24 ENCOUNTER — Other Ambulatory Visit: Payer: Medicare Other

## 2022-01-25 ENCOUNTER — Ambulatory Visit: Payer: Medicare Other | Admitting: Internal Medicine

## 2022-01-29 ENCOUNTER — Ambulatory Visit (HOSPITAL_BASED_OUTPATIENT_CLINIC_OR_DEPARTMENT_OTHER)
Admission: RE | Admit: 2022-01-29 | Discharge: 2022-01-29 | Disposition: A | Discharge status: Home / Self Care | Payer: Medicare Other | Source: Ambulatory Visit | Attending: Internal Medicine | Admitting: Internal Medicine

## 2022-01-29 ENCOUNTER — Other Ambulatory Visit: Payer: Self-pay

## 2022-01-29 DIAGNOSIS — J984 Other disorders of lung: Secondary | ICD-10-CM | POA: Diagnosis not present

## 2022-01-29 DIAGNOSIS — R0602 Shortness of breath: Secondary | ICD-10-CM | POA: Diagnosis present

## 2022-01-30 ENCOUNTER — Other Ambulatory Visit (HOSPITAL_COMMUNITY): Payer: Self-pay | Admitting: Internal Medicine

## 2022-01-30 ENCOUNTER — Other Ambulatory Visit: Payer: Self-pay | Admitting: Psychiatry

## 2022-01-30 ENCOUNTER — Ambulatory Visit (INDEPENDENT_AMBULATORY_CARE_PROVIDER_SITE_OTHER): Payer: Medicare Other

## 2022-01-30 VITALS — BP 112/68 | HR 81 | Temp 98.5°F | Resp 16 | Ht 70.0 in | Wt 180.6 lb

## 2022-01-30 DIAGNOSIS — Z0000 Encounter for general adult medical examination without abnormal findings: Secondary | ICD-10-CM

## 2022-01-30 DIAGNOSIS — Z1211 Encounter for screening for malignant neoplasm of colon: Secondary | ICD-10-CM | POA: Diagnosis not present

## 2022-01-30 NOTE — Patient Instructions (Signed)
Mr. Alec Taylor , Thank you for taking time to come for your Medicare Wellness Visit. I appreciate your ongoing commitment to your health goals. Please review the following plan we discussed and let me know if I can assist you in the future.   Screening recommendations/referrals: Colonoscopy: Referral placed today. Recommended yearly ophthalmology/optometry visit for glaucoma screening and checkup Recommended yearly dental visit for hygiene and checkup  Vaccinations: Influenza vaccine: Up to date Pneumococcal vaccine: Up to date Tdap vaccine: Up to date Shingles vaccine: May obtain vaccine at your local pharmacy. Covid-19: Booster available at the phrmacy  Advanced directives: Please bring a copy of Living Will and/or Healthcare Power of Attorney for your chart.   Conditions/risks identified: See problem list  Next appointment: Follow up in one year for your annual wellness visit.   Preventive Care 74 Years and Older, Male Preventive care refers to lifestyle choices and visits with your health care provider that can promote health and wellness. What does preventive care include? A yearly physical exam. This is also called an annual well check. Dental exams once or twice a year. Routine eye exams. Ask your health care provider how often you should have your eyes checked. Personal lifestyle choices, including: Daily care of your teeth and gums. Regular physical activity. Eating a healthy diet. Avoiding tobacco and drug use. Limiting alcohol use. Practicing safe sex. Taking low doses of aspirin every day. Taking vitamin and mineral supplements as recommended by your health care provider. What happens during an annual well check? The services and screenings done by your health care provider during your annual well check will depend on your age, overall health, lifestyle risk factors, and family history of disease. Counseling  Your health care provider may ask you questions about  your: Alcohol use. Tobacco use. Drug use. Emotional well-being. Home and relationship well-being. Sexual activity. Eating habits. History of falls. Memory and ability to understand (cognition). Work and work Statistician. Screening  You may have the following tests or measurements: Height, weight, and BMI. Blood pressure. Lipid and cholesterol levels. These may be checked every 5 years, or more frequently if you are over 62 years old. Skin check. Lung cancer screening. You may have this screening every year starting at age 62 if you have a 30-pack-year history of smoking and currently smoke or have quit within the past 15 years. Fecal occult blood test (FOBT) of the stool. You may have this test every year starting at age 37. Flexible sigmoidoscopy or colonoscopy. You may have a sigmoidoscopy every 5 years or a colonoscopy every 10 years starting at age 6. Prostate cancer screening. Recommendations will vary depending on your family history and other risks. Hepatitis C blood test. Hepatitis B blood test. Sexually transmitted disease (STD) testing. Diabetes screening. This is done by checking your blood sugar (glucose) after you have not eaten for a while (fasting). You may have this done every 1-3 years. Abdominal aortic aneurysm (AAA) screening. You may need this if you are a current or former smoker. Osteoporosis. You may be screened starting at age 21 if you are at high risk. Talk with your health care provider about your test results, treatment options, and if necessary, the need for more tests. Vaccines  Your health care provider may recommend certain vaccines, such as: Influenza vaccine. This is recommended every year. Tetanus, diphtheria, and acellular pertussis (Tdap, Td) vaccine. You may need a Td booster every 10 years. Zoster vaccine. You may need this after age 61. Pneumococcal 13-valent  conjugate (PCV13) vaccine. One dose is recommended after age 80. Pneumococcal  polysaccharide (PPSV23) vaccine. One dose is recommended after age 68. Talk to your health care provider about which screenings and vaccines you need and how often you need them. This information is not intended to replace advice given to you by your health care provider. Make sure you discuss any questions you have with your health care provider. Document Released: 12/23/2015 Document Revised: 08/15/2016 Document Reviewed: 09/27/2015 Elsevier Interactive Patient Education  2017 [place] Prevention in the Home Falls can cause injuries. They can happen to people of all ages. There are many things you can do to make your home safe and to help prevent falls. What can I do on the outside of my home? Regularly fix the edges of walkways and driveways and fix any cracks. Remove anything that might make you trip as you walk through a door, such as a raised step or threshold. Trim any bushes or trees on the path to your home. Use bright outdoor lighting. Clear any walking paths of anything that might make someone trip, such as rocks or tools. Regularly check to see if handrails are loose or broken. Make sure that both sides of any steps have handrails. Any raised decks and porches should have guardrails on the edges. Have any leaves, snow, or ice cleared regularly. Use sand or salt on walking paths during winter. Clean up any spills in your garage right away. This includes oil or grease spills. What can I do in the bathroom? Use night lights. Install grab bars by the toilet and in the tub and shower. Do not use towel bars as grab bars. Use non-skid mats or decals in the tub or shower. If you need to sit down in the shower, use a plastic, non-slip stool. Keep the floor dry. Clean up any water that spills on the floor as soon as it happens. Remove soap buildup in the tub or shower regularly. Attach bath mats securely with double-sided non-slip rug tape. Do not have throw rugs and other  things on the floor that can make you trip. What can I do in the bedroom? Use night lights. Make sure that you have a light by your bed that is easy to reach. Do not use any sheets or blankets that are too big for your bed. They should not hang down onto the floor. Have a firm chair that has side arms. You can use this for support while you get dressed. Do not have throw rugs and other things on the floor that can make you trip. What can I do in the kitchen? Clean up any spills right away. Avoid walking on wet floors. Keep items that you use a lot in easy-to-reach places. If you need to reach something above you, use a strong step stool that has a grab bar. Keep electrical cords out of the way. Do not use floor polish or wax that makes floors slippery. If you must use wax, use non-skid floor wax. Do not have throw rugs and other things on the floor that can make you trip. What can I do with my stairs? Do not leave any items on the stairs. Make sure that there are handrails on both sides of the stairs and use them. Fix handrails that are broken or loose. Make sure that handrails are as long as the stairways. Check any carpeting to make sure that it is firmly attached to the stairs. Fix any carpet that  is loose or worn. Avoid having throw rugs at the top or bottom of the stairs. If you do have throw rugs, attach them to the floor with carpet tape. Make sure that you have a light switch at the top of the stairs and the bottom of the stairs. If you do not have them, ask someone to add them for you. What else can I do to help prevent falls? Wear shoes that: Do not have high heels. Have rubber bottoms. Are comfortable and fit you well. Are closed at the toe. Do not wear sandals. If you use a stepladder: Make sure that it is fully opened. Do not climb a closed stepladder. Make sure that both sides of the stepladder are locked into place. Ask someone to hold it for you, if possible. Clearly  mark and make sure that you can see: Any grab bars or handrails. First and last steps. Where the edge of each step is. Use tools that help you move around (mobility aids) if they are needed. These include: Canes. Walkers. Scooters. Crutches. Turn on the lights when you go into a dark area. Replace any light bulbs as soon as they burn out. Set up your furniture so you have a clear path. Avoid moving your furniture around. If any of your floors are uneven, fix them. If there are any pets around you, be aware of where they are. Review your medicines with your doctor. Some medicines can make you feel dizzy. This can increase your chance of falling. Ask your doctor what other things that you can do to help prevent falls. This information is not intended to replace advice given to you by your health care provider. Make sure you discuss any questions you have with your health care provider. Document Released: 09/22/2009 Document Revised: 05/03/2016 Document Reviewed: 12/31/2014 Elsevier Interactive Patient Education  2017 [employer].

## 2022-01-30 NOTE — Telephone Encounter (Signed)
Appt Thursday

## 2022-01-30 NOTE — Progress Notes (Signed)
Subjective:   Alec Taylor is a 76 y.o. male who presents for an Initial Medicare Annual Wellness Visit.  Review of Systems     Cardiac Risk Factors include: advanced age (>63men, >51 women);hypertension;male gender;diabetes mellitus;sedentary lifestyle     Objective:    Today's Vitals   01/30/22 0950  BP: 112/68  Pulse: 81  Resp: 16  Temp: 98.5 F (36.9 C)  TempSrc: Oral  SpO2: 94%  Weight: 180 lb 9.6 oz (81.9 kg)  Height: 5\' 10"  (1.778 m)   Body mass index is 25.91 kg/m.  Advanced Directives 01/30/2022 12/25/2021 10/23/2021 10/09/2021 06/19/2021 03/21/2021 02/16/2021  Does Patient Have a Medical Advance Directive? Yes Yes Yes No Yes Yes Yes  Type of Paramedic of [place];Living will [place];Living will Living will;Healthcare Power of [place];Living will - [place];Living will  Does patient want to make changes to medical advance directive? - No - Patient declined No - Patient declined - No - Patient declined No - Patient declined -  Copy of [place] in Chart? No - copy requested - No - copy requested - No - copy requested - No - copy requested  Would patient like information on creating a medical advance directive? - - No - Patient declined No - Patient declined - - No - Patient declined    Current Medications (verified) Outpatient Encounter Medications as of 01/30/2022  Medication Sig   ALPRAZolam (XANAX) 0.25 MG tablet TAKE 1/2 TO 1 TABLET BY MOUTH DAILY AS NEEDED FOR ANXIETY   aspirin EC 81 MG tablet Take 81 mg by mouth at bedtime.    atorvastatin (LIPITOR) 20 MG tablet TAKE 1 TABLET BY MOUTH DAILY   buPROPion (WELLBUTRIN XL) 150 MG 24 hr tablet Take 1 tablet (150 mg total) by mouth daily.   carvedilol (COREG) 12.5 MG tablet Take 1.5 tablets (18.75 mg total) by mouth 2 (two) times daily with a meal. (Patient taking differently: Take 12.5 mg by mouth 2  (two) times daily with a meal.)   Cholecalciferol (VITAMIN D3) 5000 units CAPS Take 5,000 Units by mouth daily.    donepezil (ARICEPT) 5 MG tablet TAKE ONE TABLET BY MOUTH DAILY   DULoxetine (CYMBALTA) 60 MG capsule Take 60 mg by mouth daily.   empagliflozin (JARDIANCE) 10 MG TABS tablet Take 1 tablet (10 mg total) by mouth daily. Please make an appointment for refills (Patient taking differently: Take 10 mg by mouth daily.)   finasteride (PROSCAR) 5 MG tablet Take 5 mg by mouth at bedtime.   fluticasone (FLONASE) 50 MCG/ACT nasal spray Place 1 spray into both nostrils daily.   folic acid (FOLVITE) 1 MG tablet Take 1 mg by mouth daily.   furosemide (LASIX) 20 MG tablet Take 2 tablets (40 mg total) by mouth daily. (Patient taking differently: Take 20 mg by mouth daily.)   glimepiride (AMARYL) 2 MG tablet Take 2 mg by mouth at bedtime.   metFORMIN (GLUCOPHAGE-XR) 500 MG 24 hr tablet Take 1 tablet (500 mg total) by mouth 2 (two) times daily.   minoxidil (LONITEN) 2.5 MG tablet Take 1.25 mg by mouth at bedtime.   nitroGLYCERIN (NITROSTAT) 0.4 MG SL tablet Place 1 tablet (0.4 mg total) under the tongue every 5 (five) minutes as needed for chest pain.   ofloxacin (OCUFLOX) 0.3 % ophthalmic solution Place 1 drop into both eyes daily as needed (Before and after eye injections).   omeprazole (  PRILOSEC) 20 MG capsule Take 2 capsules (40 mg total) by mouth daily. (Patient taking differently: Take 20 mg by mouth 2 (two) times daily.)   polyvinyl alcohol (LIQUIFILM TEARS) 1.4 % ophthalmic solution Place 1 drop into the left eye daily as needed for dry eyes.   spironolactone (ALDACTONE) 25 MG tablet TAKE 1/2 TABLET BY MOUTH DAILY   zolpidem (AMBIEN CR) 12.5 MG CR tablet TAKE ONE TABLET BY MOUTH EVERY NIGHT AT BEDTIME AS NEEDED FOR SLEEP   [DISCONTINUED] atorvastatin (LIPITOR) 20 MG tablet Take 1 tablet (20 mg total) by mouth daily. Please make an appointment for further refills (Patient taking differently: Take  20 mg by mouth at bedtime.)   No facility-administered encounter medications on file as of 01/30/2022.    Allergies (verified) Celecoxib, Losartan, and Ramipril   History: Past Medical History:  Diagnosis Date   CAD (coronary artery disease) 09/2007   s/p CABG approximately 2004-[facility] (Cath - 17 Sep 2007:90% prox LAD with diffuse 85% narrowing to distal segment, 80% prox CXA, RCA: prox 90%, prox-mid 70%, mid 85%, distal 80%, D1 80%; Normal EF 65% --status post CABG--LIMA to LAD, SVG to PDA.     COPD (chronic obstructive pulmonary disease) (HCC)    Depression    Diabetes mellitus without complication (HCC)    Hypertension    Ischemic cardiomyopathy    Pleural effusion    S/P CABG x 2    CABG--LIMA to LAD, SVG to PDA.     Past Surgical History:  Procedure Laterality Date   APPENDECTOMY     CHOLECYSTECTOMY     CORONARY ARTERY BYPASS GRAFT     GASTRIC BYPASS     LEFT HEART CATH AND CORS/GRAFTS ANGIOGRAPHY N/A 04/30/2018   Procedure: LEFT HEART CATH AND CORS/GRAFTS ANGIOGRAPHY;  Surgeon: Leonie Man, MD;  Location: [place];  Service: Cardiovascular;  Laterality: N/A;   LEFT HEART CATH AND CORS/GRAFTS ANGIOGRAPHY N/A 10/10/2021   Procedure: LEFT HEART CATH AND CORS/GRAFTS ANGIOGRAPHY;  Surgeon: Jolaine Artist, MD;  Location: [place] [facility];  Service: Cardiovascular;  Laterality: N/A;   PACEMAKER IMPLANT N/A 12/25/2021   Procedure: PACEMAKER IMPLANT;  Surgeon: Evans Lance, MD;  Location: [place] [facility];  Service: Cardiovascular;  Laterality: N/A;   RIGHT/LEFT HEART CATH AND CORONARY/GRAFT ANGIOGRAPHY N/A 02/18/2020   Procedure: RIGHT/LEFT HEART CATH AND CORONARY/GRAFT ANGIOGRAPHY;  Surgeon: Jolaine Artist, MD;  Location: [place] [facility];  Service: Cardiovascular;  Laterality: N/A;   SVT ABLATION N/A 12/25/2021   Procedure: SVT ABLATION;  Surgeon: Evans Lance, MD;  Location: [place] [facility];  Service: Cardiovascular;  Laterality: N/A;   TOE  AMPUTATION     Family History  Problem Relation Age of Onset   Hypertension Father    COPD Father    Social History   Socioeconomic History   Marital status: Married    Spouse name: Not on file   Number of children: Not on file   Years of education: Not on file   Highest education level: Not on file  Occupational History   Not on file  Tobacco Use   Smoking status: Never   Smokeless tobacco: Never  Vaping Use   Vaping Use: Never used  Substance and Sexual Activity   Alcohol use: Yes    Alcohol/week: 2.0 standard drinks    Types: 2 Cans of beer per week    Comment: occ   Drug use: Never   Sexual activity: Not on  file  Other Topics Concern   Not on file  Social History Narrative   Not on file   Social Determinants of Health   Financial Resource Strain: Low Risk    Difficulty of Paying Living Expenses: Not hard at all  Food Insecurity: No Food Insecurity   Worried About Charity fundraiser in the Last Year: Never true   [place] in the Last Year: Never true  Transportation Needs: No Transportation Needs   Lack of Transportation (Medical): No   Lack of Transportation (Non-Medical): No  Physical Activity: Inactive   Days of Exercise per Week: 0 days   Minutes of Exercise per Session: 0 min  Stress: Stress Concern Present   Feeling of Stress : To some extent  Social Connections: Moderately Isolated   Frequency of Communication with Friends and Family: More than three times a week   Frequency of Social Gatherings with Friends and Family: Twice a week   Attends Religious Services: Never   Marine scientist or Organizations: No   Attends Music therapist: Never   Marital Status: Married    Tobacco Counseling Counseling given: Not Answered   Clinical Intake:  Pre-visit preparation completed: Yes        BMI - recorded: 25.91 Nutritional Status: BMI 25 -29 Overweight Nutritional Risks: Unintentional weight loss Diabetes: Yes CBG  done?: No Did pt. bring in CBG monitor from home?: No  How often do you need to have someone help you when you read instructions, pamphlets, or other written materials from your doctor or pharmacy?: 1 - Never  Diabetes:  Is the patient diabetic?  Yes  If diabetic, was a CBG obtained today?  No  Did the patient bring in their glucometer from home?  No  How often do you monitor your CBG's? never.   Financial Strains and Diabetes Management:  Are you having any financial strains with the device, your supplies or your medication? No .  Does the patient want to be seen by Chronic Care Management for management of their diabetes?  No  Would the patient like to be referred to a Nutritionist or for Diabetic Management?  No   Diabetic Exams:  Diabetic Eye Exam: . Overdue for diabetic eye exam. Pt has been advised about the importance in completing this exam.   Diabetic Foot Exam: t has been advised about the importance in completing this exam. To be completed by PCP   Interpreter Needed?: No  Information entered by :: Caroleen Hamman LPN   Activities of Daily Living In your present state of health, do you have any difficulty performing the following activities: 01/30/2022 10/10/2021  Hearing? N N  Vision? N N  Difficulty concentrating or making decisions? Y N  Walking or climbing stairs? Y N  Comment stairs -  Dressing or bathing? N N  Doing errands, shopping? N N  Preparing Food and eating ? N -  Using the Toilet? N -  In the past six months, have you accidently leaked urine? N -  Do you have problems with loss of bowel control? N -  Managing your Medications? N -  Managing your Finances? N -  Housekeeping or managing your Housekeeping? N -  Some recent data might be hidden    Patient Care Team: Shelda Pal, [name] Sherren Mocha, [name] Bensimhon, Shaune Pascal, MD as PCP - Advanced Heart Failure  (Cardiology) Lovena Le,  Champ Mungo, MD as PCP - Electrophysiology (Cardiology) Bensimhon, Shaune Pascal, MD as Consulting Physician (Cardiology)  Indicate any recent Medical Services you may have received from other than [facility] providers in the past year (date may be approximate).     Assessment:   This is a routine wellness examination for Alec Taylor.  Hearing/Vision screen Hearing Screening - Comments:: No issues Vision Screening - Comments:: Last eye exam-within the last 3 months. Dr. Meda Coffee  Dietary issues and exercise activities discussed: Current Exercise Habits: The patient does not participate in regular exercise at present, Exercise limited by: respiratory conditions(s);cardiac condition(s)   Goals Addressed             This Visit's Progress    Patient Stated       Be able to play golf without getting out of breath       Depression Screen PHQ 2/9 Scores 01/30/2022 10/13/2021  PHQ - 2 Score 1 0  PHQ- 9 Score - 4    Fall Risk Fall Risk  01/30/2022  Falls in the past year? 0  Number falls in past yr: 0  Injury with Fall? 0  Follow up Falls prevention discussed    FALL RISK PREVENTION PERTAINING TO THE HOME:  Any stairs in or around the home? Yes  If so, are there any without handrails? No  Home free of loose throw rugs in walkways, pet beds, electrical cords, etc? Yes  Adequate lighting in your home to reduce risk of falls? Yes   ASSISTIVE DEVICES UTILIZED TO PREVENT FALLS:  Life alert? No  Use of a cane, walker or w/c? No  Grab bars in the bathroom? Yes  Shower chair or bench in shower? Yes  Elevated toilet seat or a handicapped toilet? No   TIMED UP AND GO:  Was the test performed? Yes .  Length of time to ambulate 10 feet: 12 sec.   Gait slow and steady without use of assistive device  Cognitive Function:Normal cognitive status assessed by direct observation by this Nurse Health Advisor. No abnormalities found.          Immunizations Immunization History   Administered Date(s) Administered   Fluad Quad(high Dose 65+) 10/26/2021   Hepatitis A 11/30/2013   Hepatitis A, Adult 11/30/2013, 11/30/2013, 01/04/2014   Hepatitis A, Ped/Adol-2 Dose 11/30/2013   Hepatitis B, adult 11/30/2013, 01/04/2014   Influenza, High Dose Seasonal PF 10/29/2018   Influenza, Quadrivalent, Recombinant, Inj, Pf 09/05/2017   Influenza, Seasonal, Injecte, Preservative Fre 10/22/2011   Influenza,inj,Quad PF,6+ Mos 08/21/2012, 08/17/2015, 10/04/2016, 09/05/2017   Influenza,inj,quad, With Preservative 11/08/2014   Influenza-Unspecified 10/09/2006, 12/15/2007, 10/24/2009, 09/05/2010, 08/07/2013   PFIZER(Purple Top)SARS-COV-2 Vaccination 12/31/2019, 01/21/2020   Pneumococcal Conjugate-13 11/10/2013, 11/08/2014   Pneumococcal Polysaccharide-23 06/17/2008, 11/22/2015   Pneumococcal-Unspecified 06/17/2008, 11/10/2013, 11/22/2015   Td 12/10/2002   Tdap 04/01/2008, 04/01/2008, 07/14/2019, 12/10/2020   Zoster Recombinat (Shingrix) 08/07/2013   Zoster, Live 08/07/2013   Zoster, Unspecified 08/07/2013    TDAP status: Up to date  Flu Vaccine status: Up to date  Pneumococcal vaccine status: Up to date  Covid-19 vaccine status: Information provided on how to obtain vaccines.   Qualifies for Shingles Vaccine? Yes   Zostavax completed Yes   Shingrix Completed?: No.    Education has been provided regarding the importance of this vaccine. Patient has been advised to call insurance company to determine out of pocket expense if they have not yet received this vaccine. Advised may also receive vaccine at local pharmacy or Health Dept. Verbalized  acceptance and understanding.  Screening Tests Health Maintenance  Topic Date Due   Zoster Vaccines- Shingrix (2 of 2) 10/02/2013   COVID-19 Vaccine (3 - Booster for Pfizer series) 03/17/2020   OPHTHALMOLOGY EXAM  05/13/2020   HEMOGLOBIN A1C  07/12/2020   COLONOSCOPY (Pts 45-53yrs Insurance coverage will need to be confirmed)   01/07/2030   TETANUS/TDAP  12/10/2030   Pneumonia Vaccine 4+ Years old  Completed   INFLUENZA VACCINE  Completed   Hepatitis C Screening  Completed   HPV VACCINES  Aged Out   FOOT EXAM  Discontinued    Health Maintenance  Health Maintenance Due  Topic Date Due   Zoster Vaccines- Shingrix (2 of 2) 10/02/2013   COVID-19 Vaccine (3 - Booster for Pfizer series) 03/17/2020   OPHTHALMOLOGY EXAM  05/13/2020   HEMOGLOBIN A1C  07/12/2020    Colorectal cancer screening: Type of screening: Colonoscopy. Completed 01/08/2020. Repeat every 10 years  Lung Cancer Screening: (Low Dose CT Chest recommended if Age 47-80 years, 30 pack-year currently smoking OR have quit w/in 15years.) does not qualify.     Additional Screening:  Hepatitis C Screening: Completed 07/14/2019  Vision Screening: Recommended annual ophthalmology exams for early detection of glaucoma and other disorders of the eye. Is the patient up to date with their annual eye exam?  Yes  Who is the provider or what is the name of the office in which the patient attends annual eye exams? Dr. Meda Coffee   Dental Screening: Recommended annual dental exams for proper oral hygiene  Community Resource Referral / Chronic Care Management: CRR required this visit?  No   CCM required this visit?  No      Plan:     I have personally reviewed and noted the following in the patients chart:   Medical and social history Use of alcohol, tobacco or illicit drugs  Current medications and supplements including opioid prescriptions. Patient is not currently taking opioid prescriptions. Functional ability and status Nutritional status Physical activity Advanced directives List of other physicians Hospitalizations, surgeries, and ER visits in previous 12 months Vitals Screenings to include cognitive, depression, and falls Referrals and appointments  In addition, I have reviewed and discussed with patient certain preventive protocols,  quality metrics, and best practice recommendations. A written personalized care plan for preventive services as well as general preventive health recommendations were provided to patient.   Patient would like to access on my-chart.   Marta Antu, LPN   [id number]  Nurse Health Advisor  Nurse Notes: None

## 2022-02-01 ENCOUNTER — Encounter: Payer: Self-pay | Admitting: Psychiatry

## 2022-02-01 ENCOUNTER — Other Ambulatory Visit: Payer: Self-pay

## 2022-02-01 ENCOUNTER — Ambulatory Visit (INDEPENDENT_AMBULATORY_CARE_PROVIDER_SITE_OTHER): Payer: Medicare Other | Admitting: Psychiatry

## 2022-02-01 VITALS — Wt 180.0 lb

## 2022-02-01 DIAGNOSIS — F411 Generalized anxiety disorder: Secondary | ICD-10-CM | POA: Diagnosis not present

## 2022-02-01 DIAGNOSIS — F5101 Primary insomnia: Secondary | ICD-10-CM

## 2022-02-01 DIAGNOSIS — F3341 Major depressive disorder, recurrent, in partial remission: Secondary | ICD-10-CM

## 2022-02-01 MED ORDER — DULOXETINE HCL 60 MG PO CPEP: 60.0000 mg | ORAL_CAPSULE | Freq: Every day | ORAL | 0 refills | Status: AC

## 2022-02-01 MED ORDER — BUPROPION HCL ER (XL) 150 MG PO TB24: 150.0000 mg | ORAL_TABLET | Freq: Every day | ORAL | 0 refills | Status: AC

## 2022-02-01 MED ORDER — ZOLPIDEM TARTRATE ER 12.5 MG PO TBCR: EXTENDED_RELEASE_TABLET | ORAL | 1 refills | Status: AC

## 2022-02-01 MED ORDER — TRAZODONE HCL 100 MG PO TABS: ORAL_TABLET | ORAL | 1 refills | Status: AC

## 2022-02-01 NOTE — Progress Notes (Signed)
Alec Taylor [id number] Sep 16, 1946 77 y.o.  Subjective:   Patient ID:  Alec Taylor is a 76 y.o. (DOB Aug 13, 1946) male.  Chief Complaint:  Chief Complaint  Patient presents with   Insomnia   Follow-up    Depression, anxiety    HPI Alec Taylor presents to the office today for follow-up of depression, anxiety, and insomnia.   He continues to have difficulty with sleep and is taking Ambien. He reports that he has been taking Xanax about 45 minutes before Ambien and reports that this helps him sleep until about 4 am.   He reports that his mood has been low, particularly last week. He reports that daughter's health improving has lifted his mood. He reports low energy and motivation. He reports that his appetite has been low. He has lost about 15 lbs in the last year. He reports that he is eating smaller amounts throughout the day. He has not been cooking as much since his wife has changed her diet. He reports concentration is good. He reports some enjoyment in things (golf, travel, family, etc). He reports suicidal thoughts "a couple of times" in response to stressors. Denies SI.   He had a cardiac ablation and unexpectedly had to have a pacemaker. He reports that he has not had any sudden increases in BP.   He reports that his endocrinologist has started him on testosterone replacement a few days ago.   His daughter's cancer recurred and she had to have chemotherapy and radiation. She recently received a positive report.   Wife and daughter are traveling to [place] soon.   Past Psychiatric Medication Trials: Cymbalta Aricept Wellbutrin XL BuSpar- Dizziness Ambien Ambien CR-More effective than IR Xanax Klonopin  Mini-Mental    Flowsheet Row Office Visit from 07/17/2021 in [facility]  Total Score (max 30 points ) 30      PHQ2-9    Flowsheet Row Clinical Support from 01/30/2022 in [facility] [place] Visit  from 10/13/2021 in [facility]  PHQ-2 Total Score 1 0  PHQ-9 Total Score -- 4      Flowsheet Row ED to Hosp-Admission (Discharged) from 10/09/2021 in [facility]  C-SSRS RISK CATEGORY No Risk        Review of Systems:  Review of Systems  Respiratory:         Shortness of breath on exertion  Cardiovascular:        Had pacemaker put in a few weeks ago.  Musculoskeletal:  Negative for gait problem.  Neurological:  Negative for tremors.  Psychiatric/Behavioral:         Please refer to HPI   Medications: I have reviewed the patient's current medications.  Current Outpatient Medications  Medication Sig Dispense Refill   ALPRAZolam (XANAX) 0.25 MG tablet TAKE 1/2 TO 1 TABLET BY MOUTH DAILY AS NEEDED FOR ANXIETY 30 tablet 1   aspirin EC 81 MG tablet Take 81 mg by mouth at bedtime.      atorvastatin (LIPITOR) 20 MG tablet TAKE 1 TABLET BY MOUTH DAILY 90 tablet 3   carvedilol (COREG) 12.5 MG tablet Take 1.5 tablets (18.75 mg total) by mouth 2 (two) times daily with a meal. (Patient taking differently: Take 12.5 mg by mouth 2 (two) times daily with a meal.) 60 tablet 5   Cholecalciferol (VITAMIN D3) 5000 units CAPS Take 5,000 Units by mouth daily.      empagliflozin (  JARDIANCE) 10 MG TABS tablet Take 1 tablet (10 mg total) by mouth daily. Please make an appointment for refills (Patient taking differently: Take 10 mg by mouth daily.) 90 tablet 3   finasteride (PROSCAR) 5 MG tablet Take 5 mg by mouth at bedtime.  6   fluticasone (FLONASE) 50 MCG/ACT nasal spray Place 1 spray into both nostrils daily. 16 g 5   folic acid (FOLVITE) 1 MG tablet Take 1 mg by mouth daily.  2   furosemide (LASIX) 20 MG tablet Take 2 tablets (40 mg total) by mouth daily. (Patient taking differently: Take 20 mg by mouth daily.) 60 tablet 4   glimepiride (AMARYL) 2 MG tablet Take 2 mg by mouth at bedtime.  1   metFORMIN (GLUCOPHAGE-XR) 500 MG 24 hr tablet  Take 1 tablet (500 mg total) by mouth 2 (two) times daily.  1   minoxidil (LONITEN) 2.5 MG tablet Take 1.25 mg by mouth at bedtime.     Multiple Vitamin (MULTIVITAMIN) tablet Take 1 tablet by mouth daily.     ofloxacin (OCUFLOX) 0.3 % ophthalmic solution Place 1 drop into both eyes daily as needed (Before and after eye injections).     omeprazole (PRILOSEC) 20 MG capsule Take 2 capsules (40 mg total) by mouth daily. (Patient taking differently: Take 20 mg by mouth 2 (two) times daily.) 30 capsule 11   polyvinyl alcohol (LIQUIFILM TEARS) 1.4 % ophthalmic solution Place 1 drop into the left eye daily as needed for dry eyes.     spironolactone (ALDACTONE) 25 MG tablet TAKE 1/2 TABLET BY MOUTH DAILY 45 tablet 3   traZODone (DESYREL) 100 MG tablet Take 1/2-1 tablet po QHS prn insomnia 30 tablet 1   buPROPion (WELLBUTRIN XL) 150 MG 24 hr tablet Take 1 tablet (150 mg total) by mouth daily. 90 tablet 0   donepezil (ARICEPT) 5 MG tablet TAKE ONE TABLET BY MOUTH DAILY (Patient not taking: Reported on 02/01/2022) 90 tablet 1   DULoxetine (CYMBALTA) 60 MG capsule Take 1 capsule (60 mg total) by mouth daily. 90 capsule 0   nitroGLYCERIN (NITROSTAT) 0.4 MG SL tablet Place 1 tablet (0.4 mg total) under the tongue every 5 (five) minutes as needed for chest pain. 100 tablet 3   [START ON 03/13/2022] zolpidem (AMBIEN CR) 12.5 MG CR tablet TAKE ONE TABLET BY MOUTH EVERY NIGHT AT BEDTIME AS NEEDED FOR SLEEP 30 tablet 1   No current facility-administered medications for this visit.    Medication Side Effects: None  Allergies:  Allergies  Allergen Reactions   Celecoxib Other (See Comments)    hyperkalemia   Losartan Other (See Comments)    hyperkalemia   Ramipril Cough         Past Medical History:  Diagnosis Date   CAD (coronary artery disease) 09/2007   s/p CABG approximately 2004-[facility] (Cath - 17 Sep 2007:90% prox LAD with diffuse 85% narrowing to distal segment, 80% prox CXA, RCA: prox 90%, prox-mid 70%,  mid 85%, distal 80%, D1 80%; Normal EF 65% --status post CABG--LIMA to LAD, SVG to PDA.     COPD (chronic obstructive pulmonary disease) (HCC)    Depression    Diabetes mellitus without complication (HCC)    Hypertension    Ischemic cardiomyopathy    Pleural effusion    S/P CABG x 2    CABG--LIMA to LAD, SVG to PDA.      Past Medical History, Surgical history, Social history, and Family history were reviewed and updated as appropriate.  Please see review of systems for further details on the patient's review from today.   Objective:   Physical Exam:  Wt 180 lb (81.6 kg)    BMI 25.83 kg/m   Physical Exam Constitutional:      General: He is not in acute distress. Musculoskeletal:        General: No deformity.  Neurological:     Mental Status: He is alert and oriented to person, place, and time.     Coordination: Coordination normal.  Psychiatric:        Attention and Perception: Attention and perception normal. He does not perceive auditory or visual hallucinations.        Mood and Affect: Mood is depressed. Mood is not anxious. Affect is not labile, blunt, angry or inappropriate.        Speech: Speech normal.        Behavior: Behavior normal.        Thought Content: Thought content normal. Thought content is not paranoid or delusional. Thought content does not include homicidal or suicidal ideation. Thought content does not include homicidal or suicidal plan.        Cognition and Memory: Cognition and memory normal.        Judgment: Judgment normal.     Comments: Insight intact    Lab Review:     Component Value Date/Time   NA 137 12/25/2021 0545   NA 140 12/14/2021 0822   K 5.0 12/25/2021 0545   CL 105 12/25/2021 0545   CO2 25 12/25/2021 0545   GLUCOSE 172 (H) 12/25/2021 0545   BUN 30 (H) 12/25/2021 0545   BUN 36 (H) 12/14/2021 0822   CREATININE 0.93 12/25/2021 0545   CREATININE 1.07 10/23/2021 1005   CREATININE 0.97 05/12/2021 1607   CALCIUM 8.7 (L) 12/25/2021  0545   PROT 7.1 10/23/2021 1005   ALBUMIN 4.6 10/23/2021 1005   AST 34 10/23/2021 1005   ALT 64 (H) 10/23/2021 1005   ALKPHOS 77 10/23/2021 1005   BILITOT 0.5 10/23/2021 1005   GFRNONAA >60 12/25/2021 0545   GFRNONAA >60 10/23/2021 1005   GFRAA >60 08/16/2020 1019       Component Value Date/Time   WBC 6.6 12/14/2021 0822   WBC 11.7 (H) 10/23/2021 1005   WBC 7.9 10/10/2021 1309   RBC 5.06 12/14/2021 0822   RBC 4.49 10/23/2021 1005   HGB 14.0 12/14/2021 0822   HCT 43.7 12/14/2021 0822   PLT 266 12/14/2021 0822   MCV 86 12/14/2021 0822   MCH 27.7 12/14/2021 0822   MCH 28.3 10/23/2021 1005   MCHC 32.0 12/14/2021 0822   MCHC 31.5 10/23/2021 1005   RDW 14.2 12/14/2021 0822   LYMPHSABS 1.4 12/14/2021 0822   MONOABS 0.8 10/23/2021 1005   EOSABS 0.3 12/14/2021 0822   BASOSABS 0.0 12/14/2021 0822    No results found for: POCLITH, LITHIUM   No results found for: PHENYTOIN, PHENOBARB, VALPROATE, CBMZ   .res Assessment: Plan:    Pt seen for 30 minutes and time spent reviewing medical history since last visit. Discussed his chief complaint of poor sleep. Recommended not combining Alprazolam with Ambien CR due to potential risks of central nervous system depression. He reports that Ambien CR has been most helpful for his insomnia and prefers to continue Ambien CR. Discussed option of augmentation with Trazodone.  Discussed potential benefits, risks, and side effects of Trazodone. Pt agrees to trial of Trazodone. Will start Trazodone 100 mg 1/2-1 tablet at bedtime as needed  for insomnia.  Continue Wellbutrin XL 150 mg po qd for depression.  Continue Cymbalta 60 mg po q am for depression and anxiety.  Continue Ambien CR 12.5 mg po QHS for insomnia.  Continue Alprazolam 0.25 mg 1/2-1 tab po qd prn severe anxiety. Pt to follow-up in 4-6 weeks or sooner if clinically indicated.  Patient advised to contact office with any questions, adverse effects, or acute worsening in signs and  symptoms.   Alec Taylor was seen today for insomnia and follow-up.  Diagnoses and all orders for this visit:  Generalized anxiety disorder -     DULoxetine (CYMBALTA) 60 MG capsule; Take 1 capsule (60 mg total) by mouth daily.  Recurrent major depressive disorder, in partial remission (HCC) -     buPROPion (WELLBUTRIN XL) 150 MG 24 hr tablet; Take 1 tablet (150 mg total) by mouth daily. -     DULoxetine (CYMBALTA) 60 MG capsule; Take 1 capsule (60 mg total) by mouth daily.  Primary insomnia -     zolpidem (AMBIEN CR) 12.5 MG CR tablet; TAKE ONE TABLET BY MOUTH EVERY NIGHT AT BEDTIME AS NEEDED FOR SLEEP -     traZODone (DESYREL) 100 MG tablet; Take 1/2-1 tablet po QHS prn insomnia     Please see After Visit Summary for patient specific instructions.  Future Appointments  Date Time Provider [place]  03/13/2022  9:30 AM Thayer Headings, PMHNP [facility] None  03/26/2022  7:05 AM [facility] DEVICE REMOTES [facility]  03/30/2022  3:00 PM Evans Lance, MD [facility]  04/23/2022  9:30 AM [facility] LAB [facility] None  04/23/2022 10:00 AM Celso Amy, NP [facility] None  06/25/2022  7:05 AM [facility] DEVICE REMOTES [facility]  09/24/2022  7:05 AM [facility] DEVICE REMOTES [facility]  12/24/2022  7:05 AM [facility] DEVICE REMOTES [facility]  03/25/2023  7:05 AM [facility] DEVICE REMOTES [facility]  06/24/2023  7:05 AM [facility] DEVICE REMOTES [facility]    No orders of the defined types were placed in this encounter.   -------------------------------

## 2022-02-07 ENCOUNTER — Encounter: Payer: Self-pay | Admitting: Family

## 2022-02-07 ENCOUNTER — Other Ambulatory Visit (HOSPITAL_COMMUNITY): Payer: Self-pay | Admitting: Adult Health

## 2022-02-09 ENCOUNTER — Encounter: Payer: Self-pay | Admitting: Family Medicine

## 2022-02-09 ENCOUNTER — Ambulatory Visit (INDEPENDENT_AMBULATORY_CARE_PROVIDER_SITE_OTHER): Payer: Medicare Other | Admitting: Family Medicine

## 2022-02-09 VITALS — BP 120/64 | HR 95 | Temp 97.7°F | Resp 16 | Ht 70.0 in | Wt 182.2 lb

## 2022-02-09 DIAGNOSIS — S81801A Unspecified open wound, right lower leg, initial encounter: Secondary | ICD-10-CM | POA: Diagnosis not present

## 2022-02-09 DIAGNOSIS — S81802A Unspecified open wound, left lower leg, initial encounter: Secondary | ICD-10-CM | POA: Diagnosis not present

## 2022-02-09 MED ORDER — DOXYCYCLINE HYCLATE 100 MG PO TABS: 100.0000 mg | ORAL_TABLET | Freq: Two times a day (BID) | ORAL | 0 refills | Status: AC

## 2022-02-09 NOTE — Progress Notes (Signed)
Chief Complaint  ?Patient presents with  ? office Visit  ?  Here for office visit to talk about paperwork  ? ? ?Alec Taylor is a 76 y.o. male here for a skin complaint. ? ?Duration: 5 days ?Location: legs, feet ?Pruritic? No ?Painful? Yes ?Drainage? No ?Myoclonus caused him to kick metal  ?Other associated symptoms: scabbing ?Therapies tried thus far: TAO ?Last A1c was 6.1 2 weeks ago.  ? ?Past Medical History:  ?Diagnosis Date  ? CAD (coronary artery disease) 09/2007  ? s/p CABG approximately 2004-[facility] (Cath - 17 Sep 2007:90% prox LAD with diffuse 85% narrowing to distal segment, 80% prox CXA, RCA: prox 90%, prox-mid 70%, mid 85%, distal 80%, D1 80%; Normal EF 65% --status post CABG--LIMA to LAD, SVG to PDA.    ? COPD (chronic obstructive pulmonary disease) (HCC)   ? Depression   ? Diabetes mellitus without complication (HCC)   ? Hypertension   ? Ischemic cardiomyopathy   ? Pleural effusion   ? S/P CABG x 2   ? CABG--LIMA to LAD, SVG to PDA.    ? ? ?BP 120/64 (BP Location: Right Arm, Patient Position: Sitting, Cuff Size: Normal)   Pulse 95   Temp 97.7 ?F (36.5 ?C) (Oral)   Resp 16   Ht 5\' 10"  (1.778 m)   Wt 182 lb 3.2 oz (82.6 kg)   SpO2 (!) 88%   BMI 26.14 kg/m?  ?Gen: awake, alert, appearing stated age ?Lungs: No accessory muscle use ?Skin: See below. Serous drainage noted, no ttp, excessive warmth, fluctuance, foul odor ?MSK: Missing 1/2/4 digits on L foot ?Psych: Age appropriate judgment and insight ? ? ?B/l LE's ? ?Wound of right lower extremity, initial encounter ? ?Wound of left lower extremity, initial encounter ? ?BID TAO. I sent in doxy should he start having s/s's of infection, which he does not today. Send message in 2 weeks if no better, will refer to wound team. ?Filled form out for orthotics.  ?F/u prn. ?The patient voiced understanding and agreement to the plan. ? ?Sharlene Dory, DO ?02/09/22 ?9:22 AM ? ?

## 2022-02-09 NOTE — Patient Instructions (Signed)
When you do wash it, use only soap and water. Do not vigorously scrub. Apply triple antibiotic ointment (like Neosporin) twice daily. Keep the area clean and dry.  ? ?Things to look out for: increasing pain not relieved by ibuprofen/acetaminophen, fevers, spreading redness, drainage of pus, or foul odor. ? ?If no improvement over the next 2 weeks, send me a message please.  ? ?Take the antibiotic if you start having fevers, pain, increasing/spreading redness, drainage, warmth. ? ?Ice/cold pack over area for 10-15 min twice daily. ? ?OK to take Tylenol 1000 mg (2 extra strength tabs) or 975 mg (3 regular strength tabs) every 6 hours as needed. ? ?Let us know if you need anything. ? ?

## 2022-03-03 ENCOUNTER — Encounter (HOSPITAL_BASED_OUTPATIENT_CLINIC_OR_DEPARTMENT_OTHER): Payer: Self-pay

## 2022-03-03 ENCOUNTER — Emergency Department (HOSPITAL_BASED_OUTPATIENT_CLINIC_OR_DEPARTMENT_OTHER): Payer: Medicare Other

## 2022-03-03 ENCOUNTER — Emergency Department (HOSPITAL_BASED_OUTPATIENT_CLINIC_OR_DEPARTMENT_OTHER)
Admission: EM | Admit: 2022-03-03 | Discharge: 2022-03-03 | Disposition: A | Discharge status: Home / Self Care | Payer: Medicare Other | Attending: Emergency Medicine | Admitting: Emergency Medicine

## 2022-03-03 ENCOUNTER — Other Ambulatory Visit: Payer: Self-pay

## 2022-03-03 DIAGNOSIS — L0390 Cellulitis, unspecified: Secondary | ICD-10-CM

## 2022-03-03 DIAGNOSIS — I509 Heart failure, unspecified: Secondary | ICD-10-CM | POA: Insufficient documentation

## 2022-03-03 DIAGNOSIS — R0602 Shortness of breath: Secondary | ICD-10-CM | POA: Insufficient documentation

## 2022-03-03 DIAGNOSIS — Z79899 Other long term (current) drug therapy: Secondary | ICD-10-CM | POA: Diagnosis not present

## 2022-03-03 DIAGNOSIS — Z7984 Long term (current) use of oral hypoglycemic drugs: Secondary | ICD-10-CM | POA: Diagnosis not present

## 2022-03-03 DIAGNOSIS — Z7982 Long term (current) use of aspirin: Secondary | ICD-10-CM | POA: Diagnosis not present

## 2022-03-03 DIAGNOSIS — M7989 Other specified soft tissue disorders: Secondary | ICD-10-CM | POA: Diagnosis present

## 2022-03-03 DIAGNOSIS — L539 Erythematous condition, unspecified: Secondary | ICD-10-CM | POA: Insufficient documentation

## 2022-03-03 DIAGNOSIS — R6883 Chills (without fever): Secondary | ICD-10-CM | POA: Insufficient documentation

## 2022-03-03 DIAGNOSIS — R52 Pain, unspecified: Secondary | ICD-10-CM | POA: Insufficient documentation

## 2022-03-03 LAB — CBC WITH DIFFERENTIAL/PLATELET
Abs Immature Granulocytes: 0.01 10*3/uL (ref 0.00–0.07)
Basophils Absolute: 0 10*3/uL (ref 0.0–0.1)
Basophils Relative: 1 %
Eosinophils Absolute: 0.2 10*3/uL (ref 0.0–0.5)
Eosinophils Relative: 3 %
HCT: 36.8 % — ABNORMAL LOW (ref 39.0–52.0)
Hemoglobin: 10.9 g/dL — ABNORMAL LOW (ref 13.0–17.0)
Immature Granulocytes: 0 %
Lymphocytes Relative: 18 %
Lymphs Abs: 1 10*3/uL (ref 0.7–4.0)
MCH: 26.2 pg (ref 26.0–34.0)
MCHC: 29.6 g/dL — ABNORMAL LOW (ref 30.0–36.0)
MCV: 88.5 fL (ref 80.0–100.0)
Monocytes Absolute: 0.5 10*3/uL (ref 0.1–1.0)
Monocytes Relative: 9 %
Neutro Abs: 3.7 10*3/uL (ref 1.7–7.7)
Neutrophils Relative %: 69 %
Platelets: 218 10*3/uL (ref 150–400)
RBC: 4.16 MIL/uL — ABNORMAL LOW (ref 4.22–5.81)
RDW: 15.1 % (ref 11.5–15.5)
WBC: 5.4 10*3/uL (ref 4.0–10.5)
nRBC: 0 % (ref 0.0–0.2)

## 2022-03-03 LAB — COMPREHENSIVE METABOLIC PANEL
ALT: 31 U/L (ref 0–44)
AST: 21 U/L (ref 15–41)
Albumin: 3.8 g/dL (ref 3.5–5.0)
Alkaline Phosphatase: 80 U/L (ref 38–126)
Anion gap: 7 (ref 5–15)
BUN: 27 mg/dL — ABNORMAL HIGH (ref 8–23)
CO2: 30 mmol/L (ref 22–32)
Calcium: 9.1 mg/dL (ref 8.9–10.3)
Chloride: 101 mmol/L (ref 98–111)
Creatinine, Ser: 1 mg/dL (ref 0.61–1.24)
GFR, Estimated: >60 mL/min (ref >60)
Glucose, Bld: 230 mg/dL — ABNORMAL HIGH (ref 70–99)
Potassium: 4.4 mmol/L (ref 3.5–5.1)
Sodium: 138 mmol/L (ref 135–145)
Total Bilirubin: 0.7 mg/dL (ref 0.3–1.2)
Total Protein: 6.8 g/dL (ref 6.5–8.1)

## 2022-03-03 LAB — URINALYSIS, ROUTINE W REFLEX MICROSCOPIC
Bilirubin Urine: NEGATIVE
Glucose, UA: >1000 mg/dL — AB
Hgb urine dipstick: NEGATIVE
Ketones, ur: NEGATIVE mg/dL
Leukocytes,Ua: NEGATIVE
Nitrite: NEGATIVE
Specific Gravity, Urine: 1.016 (ref 1.005–1.030)
pH: 5 (ref 5.0–8.0)

## 2022-03-03 LAB — LACTIC ACID, PLASMA: Lactic Acid, Venous: 1.1 mmol/L (ref 0.5–1.9)

## 2022-03-03 LAB — CBG MONITORING, ED: Glucose-Capillary: 236 mg/dL — ABNORMAL HIGH (ref 70–99)

## 2022-03-03 LAB — BRAIN NATRIURETIC PEPTIDE: B Natriuretic Peptide: 836.5 pg/mL — ABNORMAL HIGH (ref 0.0–100.0)

## 2022-03-03 MED ORDER — SODIUM CHLORIDE 0.9 % IV SOLN: INTRAVENOUS | Status: DC

## 2022-03-03 MED ORDER — CEPHALEXIN 500 MG PO CAPS: 1000.0000 mg | ORAL_CAPSULE | Freq: Two times a day (BID) | ORAL | 0 refills | Status: AC

## 2022-03-03 MED ORDER — DOXYCYCLINE HYCLATE 100 MG PO CAPS: 100.0000 mg | ORAL_CAPSULE | Freq: Two times a day (BID) | ORAL | 0 refills | Status: AC

## 2022-03-03 MED ORDER — FUROSEMIDE 10 MG/ML IJ SOLN
60.0000 mg | Freq: Once | INTRAMUSCULAR | Status: AC
  Administered 2022-03-03: 60 mg via INTRAVENOUS
  Filled 2022-03-03: qty 6

## 2022-03-03 NOTE — ED Triage Notes (Signed)
Onset three weeks of bilateral leg pain.  Wounds to both shins states hit shins on coffee table.  Drainage noted from right shin.  Ambulatory to room. Patient states also has some SOB ?

## 2022-03-03 NOTE — ED Provider Notes (Signed)
?[facility] EMERGENC[facility] DEPT ?Provider Note ? ? ?CSN: [id number] ?Arrival date & time: 03/03/22  0703 ? ?  ? ?History ? ?Chief Complaint  ?Patient presents with  ? Leg Pain  ? ? ?Imaad D Brander is a 76 y.o. male. ? ?76 year old male presents with swelling to his right lower extremity.  Patient states that he has been treating a wound which she sustained on a piece of furniture at home for several days with oral antibiotics.  Did develop some body aches and chills.  Chronically short of breath and states that it is not really different.  Notes when he stands up it gets worse.  No reported fever.  No vomiting or diarrhea.  Denies any chest pain or chest pressure. ? ? ?  ? ?Home Medications ?Prior to Admission medications   ?Medication Sig Start Date End Date Taking? Authorizing Provider  ?ALPRAZolam (XANAX) 0.25 MG tablet TAKE 1/2 TO 1 TABLET BY MOUTH DAILY AS NEEDED FOR ANXIETY 11/27/21   Corie Chiquito, PMHNP  ?aspirin EC 81 MG tablet Take 81 mg by mouth at bedtime.     [provider]  ?atorvastatin (LIPITOR) 20 MG tablet TAKE 1 TABLET BY MOUTH DAILY 01/30/22   Bensimhon, Bevelyn Buckles, MD  ?buPROPion (WELLBUTRIN XL) 150 MG 24 hr tablet Take 1 tablet (150 mg total) by mouth daily. 02/01/22   Corie Chiquito, PMHNP  ?carvedilol (COREG) 12.5 MG tablet Take 1.5 tablets (18.75 mg total) by mouth 2 (two) times daily with a meal. ?Patient taking differently: Take 12.5 mg by mouth 2 (two) times daily with a meal. 11/07/21   Bensimhon, Bevelyn Buckles, MD  ?Cholecalciferol (VITAMIN D3) 5000 units CAPS Take 5,000 Units by mouth daily.  12/19/17   [provider]  ?donepezil (ARICEPT) 5 MG tablet TAKE ONE TABLET BY MOUTH DAILY 01/15/22   Corie Chiquito, PMHNP  ?DULoxetine (CYMBALTA) 60 MG capsule Take 1 capsule (60 mg total) by mouth daily. 02/01/22   Corie Chiquito, PMHNP  ?empagliflozin (JARDIANCE) 10 MG TABS tablet Take 1 tablet (10 mg total) by mouth daily. Please make an appointment for refills ?Patient  taking differently: Take 10 mg by mouth daily. 06/14/21   Bensimhon, Bevelyn Buckles, MD  ?finasteride (PROSCAR) 5 MG tablet Take 5 mg by mouth at bedtime. 04/20/18   [provider]  ?fluticasone (FLONASE) 50 MCG/ACT nasal spray Place 1 spray into both nostrils daily. 10/26/21   Charlott Holler, MD  ?folic acid (FOLVITE) 1 MG tablet Take 1 mg by mouth daily. 04/20/18   [provider]  ?furosemide (LASIX) 20 MG tablet TAKE 2 TABLETS BY MOUTH DAILY 02/07/22   Bensimhon, Bevelyn Buckles, MD  ?glimepiride (AMARYL) 2 MG tablet Take 2 mg by mouth at bedtime. 04/20/18   [provider]  ?metFORMIN (GLUCOPHAGE-XR) 500 MG 24 hr tablet Take 1 tablet (500 mg total) by mouth 2 (two) times daily. 10/13/21   Allayne Butcher, PA-C  ?minoxidil (LONITEN) 2.5 MG tablet Take 1.25 mg by mouth at bedtime. 09/03/21   [provider]  ?Multiple Vitamin (MULTIVITAMIN) tablet Take 1 tablet by mouth daily.    [provider]  ?nitroGLYCERIN (NITROSTAT) 0.4 MG SL tablet Place 1 tablet (0.4 mg total) under the tongue every 5 (five) minutes as needed for chest pain. 05/09/21 05/09/22  Clegg, Amy D, NP  ?ofloxacin (OCUFLOX) 0.3 % ophthalmic solution Place 1 drop into both eyes daily as needed (Before and after eye injections).    [provider]  ?omeprazole (PRILOSEC)  20 MG capsule Take 2 capsules (40 mg total) by mouth daily. ?Patient taking differently: Take 20 mg by mouth 2 (two) times daily. 10/26/21   Charlott Holler, MD  ?polyvinyl alcohol (LIQUIFILM TEARS) 1.4 % ophthalmic solution Place 1 drop into the left eye daily as needed for dry eyes.    [provider]  ?spironolactone (ALDACTONE) 25 MG tablet TAKE 1/2 TABLET BY MOUTH DAILY 01/12/22   Bensimhon, Bevelyn Buckles, MD  ?traZODone (DESYREL) 100 MG tablet Take 1/2-1 tablet po QHS prn insomnia 02/01/22   Corie Chiquito, PMHNP  ?zolpidem (AMBIEN CR) 12.5 MG CR tablet TAKE ONE TABLET BY MOUTH EVERY NIGHT AT BEDTIME AS NEEDED FOR SLEEP 03/13/22   Corie Chiquito, PMHNP  ?   ? ?Allergies    ?Celecoxib, Losartan, and Ramipril   ? ?Review of Systems   ?Review of Systems  ?All other systems reviewed and are negative. ? ?Physical Exam ?Updated Vital Signs ?BP 97/69 (BP Location: Right Arm)   Pulse 98   Temp 98.7 ?F (37.1 ?C) (Oral)   Resp (!) 22   Ht 1.778 m ( )   Wt 79.4 kg   SpO2 (!) 89%   BMI 25.11 kg/m?  ?Physical Exam ?Vitals and nursing note reviewed.  ?Constitutional:   ?   General: He is not in acute distress. ?   Appearance: Normal appearance. He is well-developed. He is not toxic-appearing.  ?HENT:  ?   Head: Normocephalic and atraumatic.  ?Eyes:  ?   General: Lids are normal.  ?   Conjunctiva/sclera: Conjunctivae normal.  ?   Pupils: Pupils are equal, round, and reactive to light.  ?Neck:  ?   Thyroid: No thyroid mass.  ?   Trachea: No tracheal deviation.  ?Cardiovascular:  ?   Rate and Rhythm: Normal rate and regular rhythm.  ?   Heart sounds: Normal heart sounds. No murmur heard. ?  No gallop.  ?Pulmonary:  ?   Effort: Pulmonary effort is normal. No respiratory distress.  ?   Breath sounds: No stridor. Examination of the right-lower field reveals decreased breath sounds and rhonchi. Decreased breath sounds and rhonchi present. No wheezing or rales.  ?Abdominal:  ?   General: There is no distension.  ?   Palpations: Abdomen is soft.  ?   Tenderness: There is no abdominal tenderness. There is no rebound.  ?Musculoskeletal:     ?   General: No tenderness. Normal range of motion.  ?   Cervical back: Normal range of motion and neck supple.  ?     Legs: ? ?Skin: ?   General: Skin is warm and dry.  ?   Findings: No abrasion or rash.  ?Neurological:  ?   Mental Status: He is alert and oriented to person, place, and time. Mental status is at baseline.  ?   GCS: GCS eye subscore is 4. GCS verbal subscore is 5. GCS motor subscore is 6.  ?   Cranial Nerves: No cranial nerve deficit.  ?   Sensory: No sensory deficit.  ?   Motor: Motor function is intact.   ?Psychiatric:     ?   Attention and Perception: Attention normal.     ?   Speech: Speech normal.     ?   Behavior: Behavior normal.  ? ? ?ED Results / Procedures / Treatments   ?Labs ?(all labs ordered are listed, but only abnormal results are displayed) ?Labs Reviewed  ?CBG MONITORING, ED - Abnormal; Notable  for the following components:  ?    Result Value  ? Glucose-Capillary 236 (*)   ? All other components within normal limits  ?CULTURE, BLOOD (ROUTINE X 2)  ?CULTURE, BLOOD (ROUTINE X 2)  ?LACTIC ACID, PLASMA  ?CBC WITH DIFFERENTIAL/PLATELET  ?COMPREHENSIVE METABOLIC PANEL  ?BRAIN NATRIURETIC PEPTIDE  ?URINALYSIS, ROUTINE W REFLEX MICROSCOPIC  ? ? ?EKG ?EKG Interpretation ? ?Date/Time:  Saturday March 03 2022 07:16:21 EDT ?Ventricular Rate:  91 ?PR Interval:  215 ?QRS Duration: 120 ?QT Interval:  393 ?QTC Calculation: 484 ?R Axis:   115 ?Text Interpretation: Sinus or ectopic atrial rhythm Prolonged PR interval RBBB and LPFB Consider left ventricular hypertrophy Abnormal T, consider ischemia, lateral leads Baseline wander in lead(s) V5 No significant change since last tracing Confirmed by Lorre Nick ([id number]) on 03/03/2022 7:30:39 AM ? ?Radiology ?No results found. ? ?Procedures ?Procedures  ? ? ?Medications Ordered in ED ?Medications  ?0.9 %  sodium chloride infusion (has no administration in time range)  ? ? ?ED Course/ Medical Decision Making/ A&P ?  ?                        ?Medical Decision Making ?Amount and/or Complexity of Data Reviewed ?Labs: ordered. ?Radiology: ordered. ? ?Risk ?Prescription drug management. ? ? ?Patient is EKG interpreted by me shows no signs of acute coronary ischemia.  Unchanged from prior studies.  Patient's chest x-ray shows possible bronchitis as well as possible developing pneumonia.  Patient states that he is had no fever or chills.  His shortness of breath has been slightly increased from baseline.  Patient has a history of CHF and is on diuretics and his BNP is elevated  here at this time.  Patient had erythema consistent with cellulitis of his right lower extremity.  No concern for necrotizing fasciitis.  Did have unilateral swelling of the right leg greater than the left and conce

## 2022-03-03 NOTE — Discharge Instructions (Signed)
Increase your Lasix to 40 mg in the morning and keep the same dose of 20 mg at night.  Call the heart failure clinic on Monday to schedule a follow-up visit.  Go to [facility] if you feel worse ?

## 2022-03-05 ENCOUNTER — Telehealth (HOSPITAL_COMMUNITY): Payer: Self-pay | Admitting: *Deleted

## 2022-03-05 ENCOUNTER — Telehealth: Payer: Self-pay | Admitting: Family Medicine

## 2022-03-05 NOTE — Telephone Encounter (Signed)
Received VM that pt went to the emergency room Saturday due to "infection in legs" (cellulitis) and was told he needed to be admitted due to fluid in lungs. Pt declined admission and wanted to be seen in office by Dr.Bensimhom. I called pt to inform him Dr.Bensimhon is [place] the [place]. Pt did not answer/left detailed vm.  ?

## 2022-03-05 NOTE — Telephone Encounter (Signed)
Pt states last visit on 3/3 with Dr. Carmelia RollerWendling, a form as well as office notes was to be faxed to [facility]  (F) [phone] for pt to get a pair of orthotics for his feet. Pt states the clinic called him and stated they have not received anything, pt needs info sent asap, thanks ?

## 2022-03-05 NOTE — Telephone Encounter (Signed)
Office notes faxed and left message on pts machine to let him know that we sent them. ?

## 2022-03-07 ENCOUNTER — Telehealth (HOSPITAL_COMMUNITY): Payer: Self-pay | Admitting: *Deleted

## 2022-03-07 ENCOUNTER — Encounter: Payer: Self-pay | Admitting: Family Medicine

## 2022-03-07 ENCOUNTER — Ambulatory Visit (INDEPENDENT_AMBULATORY_CARE_PROVIDER_SITE_OTHER): Payer: Medicare Other | Admitting: Family Medicine

## 2022-03-07 VITALS — BP 120/68 | HR 100 | Temp 98.0°F | Ht 70.0 in | Wt 180.5 lb

## 2022-03-07 DIAGNOSIS — S81801D Unspecified open wound, right lower leg, subsequent encounter: Secondary | ICD-10-CM | POA: Diagnosis not present

## 2022-03-07 DIAGNOSIS — E119 Type 2 diabetes mellitus without complications: Secondary | ICD-10-CM

## 2022-03-07 NOTE — Telephone Encounter (Signed)
Pts wife left vm stating she called on 3/27 and had not received a return call she wanted pt seen because he was having issues with his lungs. I personally called pt on 3/27 and left a detailed vm that Dr.Bensimhon was out of the [place]. I called today to get more information. No answer left vm for return call.  ?

## 2022-03-07 NOTE — Patient Instructions (Addendum)
Finish the antibiotics. ? ?If you do not hear anything about your referral in the next 1-2 weeks, call our office and ask for an update. ? ?Let us know if you need anything. ?

## 2022-03-07 NOTE — Progress Notes (Signed)
Chief Complaint  ?Patient presents with  ? Leg Injury  ? ? ?Alec Taylor is a 76 y.o. male here for a skin complaint.  He is here with his daughter. ? ?Duration: 4 weeks ?Location: [place] ?Pruritic? No ?Painful? Yes ?Drainage? No ?He was seen 3 weeks ago, given the antibiotic which did help and then it started to worsen again, went to the emergency department on 3/25 and was treated with doxycycline and Keflex. ?Other associated symptoms: Wound on right lower leg getting bigger, his dog may be licking him, he has a history of diabetic neuropathy has poor sensation in his lower extremities ? ?Past Medical History:  ?Diagnosis Date  ? CAD (coronary artery disease) 09/2007  ? s/p CABG approximately 2004-[facility] (Cath - 17 Sep 2007:90% prox LAD with diffuse 85% narrowing to distal segment, 80% prox CXA, RCA: prox 90%, prox-mid 70%, mid 85%, distal 80%, D1 80%; Normal EF 65% --status post CABG--LIMA to LAD, SVG to PDA.    ? COPD (chronic obstructive pulmonary disease) (HCC)   ? Depression   ? Diabetes mellitus without complication (HCC)   ? Hypertension   ? Ischemic cardiomyopathy   ? Pleural effusion   ? S/P CABG x 2   ? CABG--LIMA to LAD, SVG to PDA.    ? ? ?BP 120/68   Pulse 100   Temp 98 ?F (36.7 ?C) (Oral)   Ht 5\' 10"  (1.778 m)   Wt 180 lb 8 oz (81.9 kg)   SpO2 97%   BMI 25.90 kg/m?  ?Gen: awake, alert, appearing stated age ?Lungs: No accessory muscle use ?Skin: See below.  There is no excessive warmth or fluctuance over the right lower wound. ?There are calluses noted on the plantar forefoot bilaterally without cracks or wounds; there is a dried excoriation on the dorsum of the right second digit; there is no athlete's foot appreciated bilaterally ?MSK: Surgically absent first and second digit on the left foot ?Neuro: Sensation is not intact to pinprick bilaterally stretching all the way up to the knees; sensation to light touch is intact slightly/reduced through the mid foot bilaterally and only mildly reduced  sensation to touch proximal to the forefoot ?Psych: Age appropriate judgment and insight ? ? ? ? ?Non-healing wound of lower extremity, right, subsequent encounter - Plan: AMB referral to wound care center ? ?Diabetes mellitus type 2 in nonobese [facility](HCC) ? ?Keep the area clean and dry.  Try to keep the dog from looking this.  Refer to the wound care team as this has been over 3 weeks and this wound is getting worse. ?Formal foot exam done today. ?F/u prn or as originally scheduled. ?The patient and his daughter voiced understanding and agreement to the plan. ? ?Sharlene Doryicholas Paul Vasti Yagi, DO ?03/07/22 ?12:04 PM ? ?

## 2022-03-08 LAB — CULTURE, BLOOD (ROUTINE X 2)
Culture: NO GROWTH
Culture: NO GROWTH
Special Requests: ADEQUATE
Special Requests: ADEQUATE

## 2022-03-12 ENCOUNTER — Encounter: Payer: Self-pay | Admitting: Family Medicine

## 2022-03-12 ENCOUNTER — Telehealth: Payer: Self-pay

## 2022-03-12 ENCOUNTER — Ambulatory Visit (INDEPENDENT_AMBULATORY_CARE_PROVIDER_SITE_OTHER): Payer: Medicare Other | Admitting: Family Medicine

## 2022-03-12 ENCOUNTER — Telehealth: Payer: Self-pay | Admitting: Gastroenterology

## 2022-03-12 VITALS — BP 110/68 | HR 81 | Temp 97.8°F | Resp 16 | Ht 70.0 in | Wt 182.8 lb

## 2022-03-12 DIAGNOSIS — S52502A Unspecified fracture of the lower end of left radius, initial encounter for closed fracture: Secondary | ICD-10-CM | POA: Diagnosis not present

## 2022-03-12 MED ORDER — OXYCODONE-ACETAMINOPHEN 5-325 MG PO TABS
1.0000 | ORAL_TABLET | ORAL | 0 refills | Status: AC | PRN
Start: 2022-03-12 — End: 2022-03-17

## 2022-03-12 NOTE — Patient Instructions (Signed)
Wrist Fracture Treated With Immobilization ?A wrist fracture is a break or crack in one of the bones of the wrist. The wrist is made up of eight small bones at the palm of the hand (carpal bones) and two long bones that make up the forearm (radius and ulna). ?If the joint is stable and the bones are still in their normal position (nondisplaced), the injury may be treated with immobilization. This involves the use of a cast, splint, or sling to hold the wrist in place. Immobilization ensures that the bones continue to stay in the correct position while the wrist is healing. ?What are the causes? ?This condition may be caused by: ?A direct force to the wrist. ?Trauma, such as a car crash or falling on an outstretched hand. ?What increases the risk? ?The following factors may make you more likely to develop this condition: ?Doing contact sports or high-risk sports such as skiing, biking, and ice skating. ?Taking steroid medicines. ?Smoking. ?Being male. ?Being Caucasian. ?Drinking more than three alcoholic beverages a day. ?Having a condition that weakens the bones (osteoporosis). ?Being older. ?Having a history of previous fractures. ?What are the signs or symptoms? ?Symptoms of this condition include: ?Pain. ?Swelling. ?Bruising. ?Not being able to move the wrist normally. ?The wrist may also hang in an odd position or appear deformed. ?How is this diagnosed? ?This condition may be diagnosed based on a physical exam and X-rays. You may also have a CT scan or MRI. ?How is this treated? ?Treatment for this condition involves wearing a cast, splint, or sling until the injured area is stable enough for you to begin range-of-motion exercises. You may also be prescribed pain medicine. ?Follow these instructions at home: ?If you have a cast: ?Do not stick anything inside the cast to scratch your skin. Doing that increases your risk of infection. ?Check the skin around the cast every day. Tell your health care provider  about any concerns. ?You may put lotion on dry skin around the edges of the cast. Do not put lotion on the skin underneath the cast. ?Keep the cast clean and dry. ?If you have a splint or sling: ?Wear the splint or sling as told by your health care provider. Remove it only as told by your health care provider. ?Loosen the splint or sling if your fingers tingle, become numb, or turn cold and blue. ?Keep the splint or sling clean and dry. ?Bathing ?Do not take baths, swim, or use a hot tub until your health care provider approves. Ask your health care provider if you may take showers. You may only be allowed to take sponge baths. ?If your cast, splint, or sling is not waterproof: ?Do not let it get wet. ?Cover it with a watertight covering when you take a bath or a shower. ?If you have a sling, remove it for bathing only if your health care provider tells you it is safe to do that. ?Managing pain, stiffness, and swelling ? ?If directed, put ice on the injured area. To do this: ?If you have a removable splint or sling, remove it as told by your health care provider. ?Put ice in a plastic bag. ?Place a towel between your skin and the bag or between your cast and the bag. ?Leave the ice on for 20 minutes, 2-3 times a day. ?Remove the ice if your skin turns bright red. This is very important. If you cannot feel pain, heat, or cold, you have a greater risk of damage to the  area. ?Move your fingers often to reduce stiffness and swelling. ?Raise (elevate) the injured area above the level of your heart while you are sitting or lying down. ?Activity ?Return to your normal activities as told by your health care provider. Ask your health care provider what activities are safe for you. ?Do not lift with or put weight on your injured wrist until your health care provider approves. ?Ask your health care provider when it is safe to drive if you have a cast, splint, or sling on your wrist. ?Do range-of-motion exercises only as told  by your health care provider or physical therapist. ?Medicines ?Take over-the-counter and prescription medicines only as told by your health care provider. ?Ask your health care provider if the medicine prescribed to you: ?Requires you to avoid driving or using machinery. ?Can cause constipation. You may need to take these actions to prevent or treat constipation: ?Drink enough fluid to keep your urine pale yellow. ?Take over-the-counter or prescription medicines. ?Eat foods that are high in fiber, such as beans, whole grains, and fresh fruits and vegetables. ?Limit foods that are high in fat and processed sugars, such as fried or sweet foods. ?General instructions ?Do not put pressure on any part of the cast or splint until it is fully hardened. This may take several hours. ?Do not use any products that contain nicotine or tobacco, such as cigarettes, e-cigarettes, and chewing tobacco. These can delay bone healing. If you need help quitting, ask your health care provider. ?Keep all follow-up visits. This is important. ?Contact a health care provider if: ?Your cast, splint, or sling is damaged or loose. ?You have any new pain, swelling, or bruising. ?Your pain, swelling, and bruising do not improve. ?You have a fever. ?You have chills. ?Get help right away if: ?Your skin or fingers on your injured arm turn blue or gray. ?Your arm feels cold or numb. ?You have severe pain in your injured wrist. ?Summary ?A wrist fracture is a break or crack in one of the bones of the wrist. ?If the joint is stable and the bones are still in their normal position, the injury may be treated by wearing a cast, splint, or sling. ?If you have a cast, check the skin around the cast every day. Tell your health care provider about any concerns. ?If you have a splint or sling, wear it as told by your health care provider. Remove it only as told by your health care provider. ?This information is not intended to replace advice given to you by  your health care provider. Make sure you discuss any questions you have with your health care provider. ?Document Revised: 03/08/2020 Document Reviewed: 03/08/2020 ?Elsevier Patient Education ? [employer]. ? ?

## 2022-03-12 NOTE — Telephone Encounter (Signed)
See after hours call  ?

## 2022-03-12 NOTE — Telephone Encounter (Signed)
Do we have records of any sort? Again, no problem to refer, since I recently saw him, but need a dx. Also, if he even were to be seen by our office, that would be ideal if he refuses ortho UC.  ?

## 2022-03-12 NOTE — Telephone Encounter (Signed)
Patient is being referred to our office for a colonoscopy. He is requesting to see Dr. Lavon Paganini. Records placed on her desk for review. ?

## 2022-03-12 NOTE — Telephone Encounter (Signed)
CALLED LEFT MESSAGE TO CALL BACK 

## 2022-03-12 NOTE — Progress Notes (Addendum)
? ?Subjective:  ? ?By signing my name below, I, Cassell Clement, attest that this documentation has been prepared under the direction and in the presence of Seabron Spates DO, 03/12/2022  ? ? Patient ID: Alec Taylor, male    DOB: 1946-05-10, 76 y.o.   MRN: [id number] ? ?Chief Complaint  ?Patient presents with  ? Fall  ?   ?  ? Follow-up  ? ? ?HPI ?Patient is in today for an office visit. He is with his spouse.   ? ?He is requesting a referral to an orthopedic specialist. He fell on 03/10/2022 in [place]. On the same day he went to the ED and was diagnosed with a fracture in his radial left wrist. He was given a cast for his fracture.  ? ?Patient complains of tingling and numbness in his left thumb. He believes symptoms could be due to either the tightness or sharpness of his cast, specifically the area located between his left thumb and finger. He does not have a file at home to file down the sharp portion of the cast.  ? ?Past Medical History:  ?Diagnosis Date  ? CAD (coronary artery disease) 09/2007  ? s/p CABG approximately 2004-[facility] (Cath - 17 Sep 2007:90% prox LAD with diffuse 85% narrowing to distal segment, 80% prox CXA, RCA: prox 90%, prox-mid 70%, mid 85%, distal 80%, D1 80%; Normal EF 65% --status post CABG--LIMA to LAD, SVG to PDA.    ? COPD (chronic obstructive pulmonary disease) (HCC)   ? Depression   ? Diabetes mellitus without complication (HCC)   ? Hypertension   ? Ischemic cardiomyopathy   ? Pleural effusion   ? S/P CABG x 2   ? CABG--LIMA to LAD, SVG to PDA.    ? ? ?Past Surgical History:  ?Procedure Laterality Date  ? APPENDECTOMY    ? CHOLECYSTECTOMY    ? CORONARY ARTERY BYPASS GRAFT    ? GASTRIC BYPASS    ? LEFT HEART CATH AND CORS/GRAFTS ANGIOGRAPHY N/A 04/30/2018  ? Procedure: LEFT HEART CATH AND CORS/GRAFTS ANGIOGRAPHY;  Surgeon: Marykay Lex, MD;  Location: [facility] INVASIVE CV LAB;  Service: Cardiovascular;  Laterality: N/A;  ? LEFT HEART CATH AND CORS/GRAFTS ANGIOGRAPHY N/A  10/10/2021  ? Procedure: LEFT HEART CATH AND CORS/GRAFTS ANGIOGRAPHY;  Surgeon: Dolores Patty, MD;  Location: [facility] INVASIVE CV LAB;  Service: Cardiovascular;  Laterality: N/A;  ? PACEMAKER IMPLANT N/A 12/25/2021  ? Procedure: PACEMAKER IMPLANT;  Surgeon: Marinus Maw, MD;  Location: [facility] INVASIVE CV LAB;  Service: Cardiovascular;  Laterality: N/A;  ? RIGHT/LEFT HEART CATH AND CORONARY/GRAFT ANGIOGRAPHY N/A 02/18/2020  ? Procedure: RIGHT/LEFT HEART CATH AND CORONARY/GRAFT ANGIOGRAPHY;  Surgeon: Dolores Patty, MD;  Location: [facility] INVASIVE CV LAB;  Service: Cardiovascular;  Laterality: N/A;  ? SVT ABLATION N/A 12/25/2021  ? Procedure: SVT ABLATION;  Surgeon: Marinus Maw, MD;  Location: [facility] INVASIVE CV LAB;  Service: Cardiovascular;  Laterality: N/A;  ? TOE AMPUTATION    ? ? ?Family History  ?Problem Relation Age of Onset  ? Hypertension Father   ? COPD Father   ? ? ?Social History  ? ?Socioeconomic History  ? Marital status: Married  ?  Spouse name: Not on file  ? Number of children: Not on file  ? Years of education: Not on file  ? Highest education level: Not on file  ?Occupational History  ? Not on file  ?Tobacco Use  ? Smoking status: Never  ? Smokeless tobacco: Never  ?  Vaping Use  ? Vaping Use: Never used  ?Substance and Sexual Activity  ? Alcohol use: Yes  ?  Alcohol/week: 2.0 standard drinks  ?  Types: 2 Cans of beer per week  ?  Comment: occ  ? Drug use: Never  ? Sexual activity: Not on file  ?Other Topics Concern  ? Not on file  ?Social History Narrative  ? Not on file  ? ?Social Determinants of Health  ? ?Financial Resource Strain: Low Risk   ? Difficulty of Paying Living Expenses: Not hard at all  ?Food Insecurity: No Food Insecurity  ? Worried About Programme researcher, broadcasting/film/video in the Last Year: Never true  ? Ran Out of Food in the Last Year: Never true  ?Transportation Needs: No Transportation Needs  ? Lack of Transportation (Medical): No  ? Lack of Transportation (Non-Medical): No  ?Physical Activity:  Inactive  ? Days of Exercise per Week: 0 days  ? Minutes of Exercise per Session: 0 min  ?Stress: Stress Concern Present  ? Feeling of Stress : To some extent  ?Social Connections: Moderately Isolated  ? Frequency of Communication with Friends and Family: More than three times a week  ? Frequency of Social Gatherings with Friends and Family: Twice a week  ? Attends Religious Services: Never  ? Active Member of Clubs or Organizations: No  ? Attends Banker Meetings: Never  ? Marital Status: Married  ?Intimate Partner Violence: Not At Risk  ? Fear of Current or Ex-Partner: No  ? Emotionally Abused: No  ? Physically Abused: No  ? Sexually Abused: No  ? ? ?Outpatient Medications Prior to Visit  ?Medication Sig Dispense Refill  ? ALPRAZolam (XANAX) 0.25 MG tablet TAKE 1/2 TO 1 TABLET BY MOUTH DAILY AS NEEDED FOR ANXIETY 30 tablet 1  ? aspirin EC 81 MG tablet Take 81 mg by mouth at bedtime.     ? atorvastatin (LIPITOR) 20 MG tablet TAKE 1 TABLET BY MOUTH DAILY 90 tablet 3  ? buPROPion (WELLBUTRIN XL) 150 MG 24 hr tablet Take 1 tablet (150 mg total) by mouth daily. 90 tablet 0  ? carvedilol (COREG) 12.5 MG tablet Take 1.5 tablets (18.75 mg total) by mouth 2 (two) times daily with a meal. (Patient taking differently: Take 12.5 mg by mouth 2 (two) times daily with a meal.) 60 tablet 5  ? cephALEXin (KEFLEX) 500 MG capsule Take 2 capsules (1,000 mg total) by mouth 2 (two) times daily. 14 capsule 0  ? Cholecalciferol (VITAMIN D3) 5000 units CAPS Take 5,000 Units by mouth daily.     ? donepezil (ARICEPT) 5 MG tablet TAKE ONE TABLET BY MOUTH DAILY 90 tablet 1  ? doxycycline (VIBRAMYCIN) 100 MG capsule Take 1 capsule (100 mg total) by mouth 2 (two) times daily. 20 capsule 0  ? DULoxetine (CYMBALTA) 60 MG capsule Take 1 capsule (60 mg total) by mouth daily. 90 capsule 0  ? empagliflozin (JARDIANCE) 10 MG TABS tablet Take 1 tablet (10 mg total) by mouth daily. Please make an appointment for refills (Patient taking  differently: Take 10 mg by mouth daily.) 90 tablet 3  ? finasteride (PROSCAR) 5 MG tablet Take 5 mg by mouth at bedtime.  6  ? fluticasone (FLONASE) 50 MCG/ACT nasal spray Place 1 spray into both nostrils daily. 16 g 5  ? folic acid (FOLVITE) 1 MG tablet Take 1 mg by mouth daily.  2  ? furosemide (LASIX) 20 MG tablet TAKE 2 TABLETS BY MOUTH DAILY  180 tablet 3  ? glimepiride (AMARYL) 2 MG tablet Take 2 mg by mouth at bedtime.  1  ? metFORMIN (GLUCOPHAGE-XR) 500 MG 24 hr tablet Take 1 tablet (500 mg total) by mouth 2 (two) times daily.  1  ? minoxidil (LONITEN) 2.5 MG tablet Take 1.25 mg by mouth at bedtime.    ? Multiple Vitamin (MULTIVITAMIN) tablet Take 1 tablet by mouth daily.    ? nitroGLYCERIN (NITROSTAT) 0.4 MG SL tablet Place 1 tablet (0.4 mg total) under the tongue every 5 (five) minutes as needed for chest pain. 100 tablet 3  ? ofloxacin (OCUFLOX) 0.3 % ophthalmic solution Place 1 drop into both eyes daily as needed (Before and after eye injections).    ? omeprazole (PRILOSEC) 20 MG capsule Take 2 capsules (40 mg total) by mouth daily. (Patient taking differently: Take 20 mg by mouth 2 (two) times daily.) 30 capsule 11  ? polyvinyl alcohol (LIQUIFILM TEARS) 1.4 % ophthalmic solution Place 1 drop into the left eye daily as needed for dry eyes.    ? spironolactone (ALDACTONE) 25 MG tablet TAKE 1/2 TABLET BY MOUTH DAILY 45 tablet 3  ? traZODone (DESYREL) 100 MG tablet Take 1/2-1 tablet po QHS prn insomnia 30 tablet 1  ? zolpidem (AMBIEN CR) 12.5 MG CR tablet TAKE ONE TABLET BY MOUTH EVERY NIGHT AT BEDTIME AS NEEDED FOR SLEEP 30 tablet 1  ? ?No facility-administered medications prior to visit.  ? ? ?Allergies  ?Allergen Reactions  ? Celecoxib Other (See Comments)  ?  hyperkalemia  ? Losartan Other (See Comments)  ?  hyperkalemia  ? Ramipril Cough  ?   ?  ? ? ?Review of Systems  ?Constitutional:  Negative for fever and malaise/fatigue.  ?HENT:  Negative for congestion.   ?Eyes:  Negative for blurred vision.   ?Respiratory:  Negative for cough and shortness of breath.   ?Cardiovascular:  Negative for chest pain, palpitations and leg swelling.  ?Gastrointestinal:  Negative for vomiting.  ?Musculoskeletal:  Negative for back pai

## 2022-03-12 NOTE — Telephone Encounter (Signed)
Caller Name Billal Minge ?Caller Phone Number [phone] ?Patient Name Alec Taylor ?Patient DOB 08/10/1946 ?Call Type Message Only Information Provided ?Reason for Call Request for General Office Information ?Initial Comment Caller states husband broke his wrist and needs a referral. Went to urgent care over the ?weekend. The cast is causing numbness in thumb. ?Additional Comment Provided office hours. ?Disp. Time Disposition Final User ?03/12/2022 7:57:10 AM General Information Provided Yes Providence Crosby ?Call Closed By: Providence Crosby ?Transaction Date/Time: 03/12/2022 7:54:50 AM (ET) ?

## 2022-03-12 NOTE — Telephone Encounter (Signed)
Alec Taylor called back and would like for the referral to be placed. Pt states he does not want to go to ortho urgent care.   ?

## 2022-03-13 ENCOUNTER — Ambulatory Visit: Payer: Medicare Other | Admitting: Psychiatry

## 2022-03-13 NOTE — Telephone Encounter (Signed)
Left message for patient to call back to schedule new patient appointment

## 2022-03-13 NOTE — Telephone Encounter (Signed)
Called left message to call back 

## 2022-03-13 NOTE — Telephone Encounter (Signed)
He is on chronic antiplatelet therapy with Plavix, please schedule next available appointment with app or me to discuss surveillance colonoscopy.  He has history of adenomatous colon polyps.  Thank you ?

## 2022-03-14 ENCOUNTER — Encounter (HOSPITAL_BASED_OUTPATIENT_CLINIC_OR_DEPARTMENT_OTHER): Payer: Medicare Other | Attending: General Surgery | Admitting: General Surgery

## 2022-03-14 ENCOUNTER — Telehealth: Payer: Self-pay | Admitting: Family Medicine

## 2022-03-14 DIAGNOSIS — L97819 Non-pressure chronic ulcer of other part of right lower leg with unspecified severity: Secondary | ICD-10-CM | POA: Insufficient documentation

## 2022-03-14 DIAGNOSIS — I5021 Acute systolic (congestive) heart failure: Secondary | ICD-10-CM | POA: Insufficient documentation

## 2022-03-14 DIAGNOSIS — I2510 Atherosclerotic heart disease of native coronary artery without angina pectoris: Secondary | ICD-10-CM | POA: Insufficient documentation

## 2022-03-14 DIAGNOSIS — E11622 Type 2 diabetes mellitus with other skin ulcer: Secondary | ICD-10-CM | POA: Insufficient documentation

## 2022-03-14 DIAGNOSIS — J449 Chronic obstructive pulmonary disease, unspecified: Secondary | ICD-10-CM | POA: Insufficient documentation

## 2022-03-14 DIAGNOSIS — Z951 Presence of aortocoronary bypass graft: Secondary | ICD-10-CM | POA: Insufficient documentation

## 2022-03-14 DIAGNOSIS — M069 Rheumatoid arthritis, unspecified: Secondary | ICD-10-CM | POA: Diagnosis not present

## 2022-03-14 DIAGNOSIS — I110 Hypertensive heart disease with heart failure: Secondary | ICD-10-CM | POA: Diagnosis not present

## 2022-03-14 DIAGNOSIS — I214 Non-ST elevation (NSTEMI) myocardial infarction: Secondary | ICD-10-CM | POA: Diagnosis not present

## 2022-03-15 ENCOUNTER — Telehealth: Payer: Self-pay | Admitting: Family Medicine

## 2022-03-15 NOTE — Telephone Encounter (Signed)
[employer] stated they need Dr. Nani Ravens to sign pt's death certificate. Stated this can be done electronically.  ?

## 2022-03-19 ENCOUNTER — Telehealth (HOSPITAL_COMMUNITY): Payer: Self-pay | Admitting: *Deleted

## 2022-03-19 NOTE — Telephone Encounter (Signed)
Called [facility] at [phone] (spoke to [employer] Bryant). ?The patient did pass away at 7:24 PM. ?Called EMS Renee Ramushris Crowder at [phone]--left msg. To call back. ? ?

## 2022-03-19 NOTE — Telephone Encounter (Signed)
Called left message to call back 

## 2022-03-19 NOTE — Telephone Encounter (Signed)
After collecting more info, it is unclear what the precise cause of death was. Will put PE in death certificate so family may grieve and get some closure, but in absence of autopsy, there is no certain way to have correct diagnosis in this situation.  ?

## 2022-03-19 NOTE — Telephone Encounter (Signed)
Spoke to his wife and she stated that he was having breathing problems before surgery, but surgery was done. ?Also she stated still having problems after surgery, but was sent home. ?His wife stated he died at home about an hour after getting home. She of course called 911/did CPR and he was gone. ? ?

## 2022-03-19 NOTE — Telephone Encounter (Signed)
His wife said they are not doing an autopsy ?

## 2022-03-19 NOTE — Telephone Encounter (Signed)
Alec Taylor returned the call and stated the patient had had surgery the day of death and his wife stated he was having SOB. ?PCP can called back at [phone] ? ?

## 2022-03-19 NOTE — Telephone Encounter (Signed)
Pts wife called to report pt passed away 04/08/23 unexpectedly from a heart attack. She asked for a call from [facility] to discuss the end of his life so that she can find peace in his passing. ? ?Rafeeq Bach call back [phone]  ?

## 2022-03-19 NOTE — Telephone Encounter (Signed)
Called the patients wife Alec Taylor at [phone] left a message to call back. ?

## 2022-03-21 ENCOUNTER — Encounter (HOSPITAL_BASED_OUTPATIENT_CLINIC_OR_DEPARTMENT_OTHER): Payer: Medicare Other | Admitting: General Surgery

## 2022-03-29 ENCOUNTER — Telehealth: Payer: Self-pay | Admitting: Internal Medicine

## 2022-03-29 NOTE — Telephone Encounter (Signed)
Patient's wife called and said she is still getting alerts from the company and MyChart even though the patients chart has been marked as deceased. She would just like the notifications to stop  ?

## 2022-03-30 ENCOUNTER — Encounter: Payer: Medicare Other | Admitting: Internal Medicine

## 2022-03-31 ENCOUNTER — Other Ambulatory Visit: Payer: Self-pay | Admitting: Psychiatry

## 2022-03-31 DIAGNOSIS — F5101 Primary insomnia: Secondary | ICD-10-CM

## 2022-04-09 NOTE — Telephone Encounter (Signed)
Called and spoke to his wife and the patient was seen by a provider in our office on Monday 03/12/22 and having surgery today (04-10-2022). ?

## 2022-04-09 NOTE — Telephone Encounter (Signed)
Have spoken to his wife this morning already. ?

## 2022-04-09 NOTE — Progress Notes (Signed)
JADAN, CUBBAGE D. ([id number]) ?Visit Report for 04-Apr-2022 ?Abuse Risk Screen Details ?Patient Name: Date of Service: ?[place], [name] Apr 04, 2022 8:00 A M ?Medical Record Number: [id number] ?Patient Account Number: [id number] ?Date of Birth/Sex: Treating RN: ?December 01, 1946 (76 y.o. Alec Taylor ?Primary Care Alec Taylor: Alec Taylor Other Clinician: ?Referring Alec Taylor: ?Treating Alec Taylor/Extender: Alec Taylor ?Alec Taylor ?Weeks in Treatment: 0 ?Abuse Risk Screen Items ?Answer ?ABUSE RISK SCREEN: ?Has anyone close to you tried to hurt or harm you recentlyo No ?Do you feel uncomfortable with anyone in your familyo No ?Has anyone forced you do things that you didnt want to doo No ?Electronic Signature(s) ?Signed: 04-Apr-2022 5:44:01 PM By: Alec Pulling RN, BSN ?Entered By: Alec Taylor on 2022/04/04 08:30:12 ?-------------------------------------------------------------------------------- ?Activities of Daily Living Details ?Patient Name: Date of Service: ?Alec [place], [name] 2022/04/04 8:00 A M ?Medical Record Number: [id number] ?Patient Account Number: [id number] ?Date of Birth/Sex: Treating RN: ?[birth year] (76 y.o. Alec Taylor ?Primary Care Alec Taylor: Alec Taylor Other Clinician: ?Referring Alec Taylor: ?Treating Alec Taylor/Extender: Alec Taylor ?Alec Taylor ?Weeks in Treatment: 0 ?Activities of Daily Living Items ?Answer ?Activities of Daily Living (Please select one for each item) ?Drive Automobile Completely Able ?T Medications ?ake Completely Able ?Use T elephone Completely Able ?Care for Appearance Completely Able ?Use T oilet Completely Able ?Bath / Shower Completely Able ?Dress Self Completely Able ?Feed Self Completely Able ?Walk Completely Able ?Get In / Out Bed Completely Able ?Housework Completely Able ?Prepare Meals Completely Able ?Handle Money Completely Able ?Shop for Self Completely Able ?Electronic Signature(s) ?Signed: 04/04/22 5:44:01 PM By: Alec Pulling RN,  BSN ?Entered By: Alec Taylor on 2022/04/04 08:30:47 ?-------------------------------------------------------------------------------- ?Education Screening Details ?Patient Name: ?Date of Service: ?[place], [name] 04-Apr-2022 8:00 A M ?Medical Record Number: [id number] ?Patient Account Number: [id number] ?Date of Birth/Sex: ?Treating RN: ?1946/08/18 (77 y.o. Alec Taylor ?Primary Care Alec Taylor: Alec Taylor ?Other Clinician: ?Referring Alec Taylor: ?Treating Alec Taylor/Extender: Alec Taylor ?Alec Taylor ?Weeks in Treatment: 0 ?Primary Learner Assessed: Patient ?Learning Preferences/Education Level/Primary Language ?Learning Preference: Explanation, Demonstration, Printed Material ?Highest Education Level: College or Above ?Preferred Language: English ?Cognitive Barrier ?Language Barrier: No ?Translator Needed: No ?Memory Deficit: No ?Emotional Barrier: No ?Cultural/Religious Beliefs Affecting Medical Care: No ?Physical Barrier ?Impaired Vision: No ?Impaired Hearing: No ?Decreased Hand dexterity: Yes ?Limitations: broken left wrist ?Knowledge/Comprehension ?Knowledge Level: High ?Comprehension Level: High ?Ability to understand written instructions: High ?Ability to understand verbal instructions: High ?Motivation ?Anxiety Level: Calm ?Cooperation: Cooperative ?Education Importance: Acknowledges Need ?Interest in Health Problems: Asks Questions ?Perception: Coherent ?Willingness to Engage in Self-Management High ?Activities: ?Readiness to Engage in Self-Management High ?Activities: ?Electronic Signature(s) ?Signed: 04/04/2022 5:44:01 PM By: Alec Pulling RN, BSN ?Entered By: Alec Taylor on 2022-04-04 08:32:17 ?-------------------------------------------------------------------------------- ?Fall Risk Assessment Details ?Patient Name: ?Date of Service: ?[place], [name] 2022/04/04 8:00 A M ?Medical Record Number: [id number] ?Patient Account Number: [id number] ?Date of Birth/Sex: ?Treating RN: ?[birth year]  (76 y.o. Alec Taylor ?Primary Care Alec Taylor: Alec Taylor ?Other Clinician: ?Referring Alec Taylor: ?Treating Alec Taylor/Extender: Alec Taylor ?Alec Taylor ?Weeks in Treatment: 0 ?Fall Risk Assessment Items ?Have you had 2 or more falls in the last 12 monthso 0 Yes ?Have you had any fall that resulted in injury in the last 12 monthso 0 Yes ?FALLS RISK SCREEN ?History of falling - immediate or within 3 months 0 No ?Secondary diagnosis (Do you have 2 or more medical diagnoseso) 0 No ?Ambulatory aid ?None/bed rest/wheelchair/nurse 0 Yes ?Crutches/cane/walker 0 No ?Furniture 0 No ?Intravenous therapy Access/Saline/Heparin Lock 0 No ?Gait/Transferring ?Normal/ bed rest/ wheelchair 0  Yes ?Weak (short steps with or without shuffle, stooped but able to lift head while walking, may seek 0 No ?support from furniture) ?Impaired (short steps with shuffle, may have difficulty arising from chair, head down, impaired 0 No ?balance) ?Mental Status ?Oriented to own ability 0 Yes ?Electronic Signature(s) ?Signed: 03/26/2022 5:44:01 PM By: Alec PullingPalmer, Carrie RN, BSN ?Entered By: Alec PullingPalmer, Taylor on Apr 01, 2022 08:33:14 ?-------------------------------------------------------------------------------- ?Foot Assessment Details ?Patient Name: ?Date of Service: ?[place], [name] 03/27/2022 8:00 A M ?Medical Record Number: [id number] ?Patient Account Number: [id number] ?Date of Birth/Sex: ?Treating RN: ?04/23/1946 [age] y.o. Alec Taylor) Alec Taylor ?Primary Care Alec Taylor: Alec ChafeWendling, Alec ?Other Clinician: ?Referring Alec Taylor: ?Treating Alec Taylor/Extender: Alec Guessannon, Alec ?Alec ChafeWendling, Alec ?Weeks in Treatment: 0 ?Foot Assessment Items ?Site Locations ?+ = Sensation present, - = Sensation absent, C = Callus, U = Ulcer ?R = Redness, W = Warmth, M = Maceration, PU = Pre-ulcerative lesion ?F = Fissure, S = Swelling, D = Dryness ?Assessment ?Right: Left: ?Other Deformity: No No ?Prior Foot Ulcer: No No ?Prior Amputation: No No ?Charcot Joint:  No No ?Ambulatory Status: ?Gait: ?Electronic Signature(s) ?Signed: 04/05/2022 5:44:01 PM By: Alec PullingPalmer, Carrie RN, BSN ?Entered By: Alec PullingPalmer, Taylor on Apr 01, 2022 08:34:29 ?-------------------------------------------------------------------------------- ?Nutrition Risk Screening Details ?Patient Name: ?Date of Service: ?[name] 04/04/2022 8:00 A M ?Medical Record Number: [id number] ?Patient Account Number: [id number] ?Date of Birth/Sex: ?Treating RN: ?10/01/1946 [age] y.o. Alec Taylor) Alec Taylor ?Primary Care Avrey Hyser: Alec ChafeWendling, Alec ?Other Clinician: ?Referring Kedrick Mcnamee: ?Treating Lyanne Kates/Extender: Alec Guessannon, Alec ?Alec ChafeWendling, Alec ?Weeks in Treatment: 0 ?Height (in): 70 ?Weight (lbs): 175 ?Body Mass Index (BMI): 25.1 ?Nutrition Risk Screening Items ?Score Screening ?NUTRITION RISK SCREEN: ?I have an illness or condition that made me change the kind and/or amount of food I eat 0 No ?I eat fewer than two meals per day 0 No ?I eat few fruits and vegetables, or milk products 0 No ?I have three or more drinks of beer, liquor or wine almost every day 0 No ?I have tooth or mouth problems that make it hard for me to eat 0 No ?I don't always have enough money to buy the food I need 0 No ?I eat alone most of the time 0 No ?I take three or more different prescribed or over-the-counter drugs a day 0 No ?Without wanting to, I have lost or gained 10 pounds in the last six months 0 No ?I am not always physically able to shop, cook and/or feed myself 0 No ?Nutrition Protocols ?Good Risk Protocol ?Moderate Risk Protocol ?High Risk Proctocol ?Risk Level: Good Risk ?Score: 0 ?Electronic Signature(s) ?Signed: 03/10/2022 5:44:01 PM By: Alec PullingPalmer, Carrie RN, BSN ?Entered By: Alec PullingPalmer, Taylor on Apr 01, 2022 08:33:32 ?

## 2022-04-09 NOTE — Telephone Encounter (Signed)
Pt stated she got a call from our office but did not know what it was regarding.  ?

## 2022-04-09 NOTE — Progress Notes (Addendum)
SINCLAIR, SCHIPP D. ([id number]) ?Visit Report for April 01, 2022 ?Chief Complaint Document Details ?Patient Name: Date of Service: ?[name], [place] D. April 01, 2022 8:00 A M ?Medical Record Number: [id number] ?Patient Account Number: [id number] ?Date of Birth/Sex: Treating RN: ?Jun 06, 1946 (76 y.o. Alec Taylor) Dellie Catholic ?Primary Care Provider: Riki Sheer Other Clinician: ?Referring Provider: ?Treating Provider/Extender: Fredirick Maudlin ?Riki Sheer ?Weeks in Treatment: 0 ?Information Obtained from: Patient ?Chief Complaint ?Patient seen for complaints of Non-Healing Wound. ?Electronic Signature(s) ?Signed: 04/01/22 9:29:18 AM By: Fredirick Maudlin MD FACS ?Entered By: Fredirick Maudlin on 01-Apr-2022 09:29:18 ?-------------------------------------------------------------------------------- ?Debridement Details ?Patient Name: Date of Service: ?[place], [place] D. 04/01/2022 8:00 A M ?Medical Record Number: [id number] ?Patient Account Number: [id number] ?Date of Birth/Sex: Treating RN: ?01-Apr-1946 (76 y.o. Alec Taylor ?Primary Care Provider: Riki Sheer Other Clinician: ?Referring Provider: ?Treating Provider/Extender: Fredirick Maudlin ?Riki Sheer ?Weeks in Treatment: 0 ?Debridement Performed for Assessment: Wound #1 Right,Medial Lower Leg ?Performed By: Physician Fredirick Maudlin, MD ?Debridement Type: Debridement ?Severity of Tissue Pre Debridement: Fat layer exposed ?Level of Consciousness (Pre-procedure): Awake and Alert ?Pre-procedure Verification/Time Out Yes - 09:02 ?Taken: ?Start Time: 09:02 ?Pain Control: ?Other : Benzocaine ?T Area Debrided (L x W): ?otal 6.1 (cm) x 4.7 (cm) = 28.67 (cm?) ?Tissue and other material debrided: ?Non-Viable, Eschar, Slough, Subcutaneous, Skin: Dermis , Slough ?Level: Skin/Subcutaneous Tissue ?Debridement Description: Excisional ?Instrument: Curette ?Bleeding: Minimum ?Hemostasis Achieved: Pressure ?Procedural Pain: 0 ?Post Procedural Pain: 0 ?Response to Treatment: Procedure  was tolerated well ?Level of Consciousness (Post- Awake and Alert ?procedure): ?Post Debridement Measurements of Total Wound ?Length: (cm) 6.1 ?Width: (cm) 4.7 ?Depth: (cm) 0.3 ?Volume: (cm?) 6.755 ?Character of Wound/Ulcer Post Debridement: Improved ?Severity of Tissue Post Debridement: Fat layer exposed ?Post Procedure Diagnosis ?Same as Pre-procedure ?Electronic Signature(s) ?Signed: 2022/04/01 12:42:55 PM By: Fredirick Maudlin MD FACS ?Signed: Apr 01, 2022 5:44:01 PM By: Sharyn Creamer RN, BSN ?Entered By: Sharyn Creamer on 01-Apr-2022 09:05:14 ?-------------------------------------------------------------------------------- ?HPI Details ?Patient Name: Date of Service: ?[place], [place] D. 01-Apr-2022 8:00 A M ?Medical Record Number: [id number] ?Patient Account Number: [id number] ?Date of Birth/Sex: Treating RN: ?Apr 17, 1946 (76 y.o. Alec Taylor) Dellie Catholic ?Primary Care Provider: Riki Sheer Other Clinician: ?Referring Provider: ?Treating Provider/Extender: Fredirick Maudlin ?Riki Sheer ?Weeks in Treatment: 0 ?History of Present Illness ?HPI Description: ADMISSION ?04/01/2022 ?This is a 76 year old man with a past medical history significant for type 2 diabetes mellitus, coronary artery disease, COPD, multiple toe amputations ?secondary to diabetic foot ulcers, ischemic cardiomyopathy, congestive heart failure, hypertension, and other cardiac problems. 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 He had a severe myoclonic jerk and struck both lower extremities on some piece of metal, which resulted in wounds ?down both anterior tibial surfaces. On the left, the wounds healed in a fairly normal fashion. On the right, however, he has had worsening of the portion of the ?wound near his ankle. There is some speculation that the patient's dog may be licking the site; the patient has no sensation in his lower extremities. The injury ?occurred at the end of February. He saw his primary care provider the  first week in March, who recommended triple antibiotic ointment to the site and keeping ?the area dry. A prescription for doxycycline was also made for the patient to take if he developed signs of infection. He ended up taking the doxycycline. He ?subsequently presented to the emergency department on March 25 with signs of cellulitis. There was no concern for fracture or DVT (ultrasound performed), ?although his right leg was more swollen compared to the left.  He was given a prescription for Keflex. He then saw his PCP again on the 29th, who again ?recommended keeping the area dry. At that point he was referred to the wound care center for further evaluation and management. ?As stated above, the patient is essentially insensate in his lower extremities secondary to diabetic neuropathy. He has not experienced any fevers or chills. ?There has been no purulent drainage from the wound. There is heavy eschar over most of the wound surface. He also has a small wound on his right second ?toe from the same episode. This also had a layer of thick eschar. This wound is fairly small and did not have any drainage. ?Electronic Signature(s) ?Signed: 04-07-22 9:37:06 AM By: Fredirick Maudlin MD FACS ?Entered By: Fredirick Maudlin on 07-Apr-2022 09:37:06 ?-------------------------------------------------------------------------------- ?Physical Exam Details ?Patient Name: Date of Service: ?[place], [place] D. 04/07/22 8:00 A M ?Medical Record Number: [id number] ?Patient Account Number: [id number] ?Date of Birth/Sex: Treating RN: ?May 24, 1946 (76 y.o. Alec Taylor) Dellie Catholic ?Primary Care Provider: Riki Sheer Other Clinician: ?Referring Provider: ?Treating Provider/Extender: Fredirick Maudlin ?Riki Sheer ?Weeks in Treatment: 0 ?Constitutional ?Normal for this patient.. . . . No acute distress. ?Respiratory ?Normal work of breathing on room air.[name] ?Cardiovascular ?2+ pitting edema in the right lower extremity; 1+ nonpitting edema in  the left. ?Notes ?07-Apr-2022: On the dorsal aspect of his right second toe, there is a pinpoint opening without any drainage or slough. There is no surrounding erythema or ?induration. On the ankle, there is a large wound with multiple cracks and crevices. There is slough present over the entire open wound surface. Heavy eschar ?overlies other areas. Once this was debrided, these were actually revealed to be islands of good epithelialized tissue. There is no odor or purulent drainage ?identified. ?Electronic Signature(s) ?Signed: 04-07-2022 9:39:12 AM By: Fredirick Maudlin MD FACS ?Entered By: Fredirick Maudlin on 04/07/2022 09:39:12 ?-------------------------------------------------------------------------------- ?Physician Orders Details ?Patient Name: ?Date of Service: ?[place], [place] D. 04/07/22 8:00 A M ?Medical Record Number: [id number] ?Patient Account Number: [id number] ?Date of Birth/Sex: ?Treating RN: ?08-07-1946 (76 y.o. Alec Taylor ?Primary Care Provider: Riki Sheer ?Other Clinician: ?Referring Provider: ?Treating Provider/Extender: Fredirick Maudlin ?Riki Sheer ?Weeks in Treatment: 0 ?Verbal / Phone Orders: No ?Diagnosis Coding ?ICD-10 Coding ?Code Description ?E11.622 Type 2 diabetes mellitus with other skin ulcer ?[id number] Acute systolic (congestive) heart failure ?I25.10 Atherosclerotic heart disease of native coronary artery without angina pectoris ?J44.9 Chronic obstructive pulmonary disease, unspecified ?I21.4 Non-ST elevation (NSTEMI) myocardial infarction ?I10 Essential (primary) hypertension ?L97.819 Non-pressure chronic ulcer of other part of right lower leg with unspecified severity ?L97.829 Non-pressure chronic ulcer of other part of left lower leg with unspecified severity ?Follow-up Appointments ?ppointment in 1 week. - Dr Celine AhrMechele Claude ?Return A ?Bathing/ Shower/ Hygiene ?May shower with protection but do not get wound dressing(s) wet. - Use cast protector in shower. May  purchase at [place], [facility], [place] ?Edema Control - Lymphedema / SCD / Other ?Elevate legs to the level of the heart or above for 30 minutes daily and/or when sitting, a frequency of: ?Avoid standing for long pe

## 2022-04-09 NOTE — Progress Notes (Signed)
Alec Taylor,  D. ([id number]) ?Visit Report for 03/19/2022 ?Allergy List Details ?Patient Name: Date of Service: ?Alec ValleyKING, [name] 03/26/2022 8:00 A M ?Medical Record Number: [id number] ?Patient Account Number: [id number] ?Date of Birth/Sex: Treating RN: ?09/28/1946 [age] y.o. Cline CoolsM) Palmer, Carrie ?Primary Care Onyx Schirmer: Arva ChafeWendling, Nicholas Other Clinician: ?Referring Raechelle Sarti: ?Treating Alexyia Guarino/Extender: Duanne Guessannon, Jennifer ?Arva ChafeWendling, Nicholas ?Weeks in Treatment: 0 ?Allergies ?Active Allergies ?celecoxib ?Reaction: intolerance ?losartan ?Reaction: intolerance ?ramipril ?Reaction: intolerance- coughing ?Allergy Notes ?all allergies are intolerance due to hyperkalemia ?Electronic Signature(s) ?Signed: 03/10/2022 5:44:01 PM By: Redmond PullingPalmer, Carrie RN, BSN ?Entered By: Redmond PullingPalmer, Carrie on 11/30/2022 16:37:56 ?-------------------------------------------------------------------------------- ?Arrival Information Details ?Patient Name: Date of Service: ?[name], [name] 04/08/2022 8:00 A M ?Medical Record Number: [id number] ?Patient Account Number: [id number] ?Date of Birth/Sex: Treating RN: ?08/19/1946 [age] y.o. Cline CoolsM) Palmer, Carrie ?Primary Care Alondra Vandeven: Arva ChafeWendling, Nicholas Other Clinician: ?Referring Faiza Bansal: ?Treating Aymara Sassi/Extender: Duanne Guessannon, Jennifer ?Arva ChafeWendling, Nicholas ?Weeks in Treatment: 0 ?Visit Information ?Patient Arrived: Ambulatory ?Arrival Time: 08:11 ?Accompanied By: daughter ?Transfer Assistance: None ?Patient Identification Verified: Yes ?Secondary Verification Process Completed: Yes ?Patient Requires Transmission-Based Precautions: No ?Patient Has Alerts: Yes ?Patient Alerts: Patient on Blood Thinner ?Notes ?aspirin ?Electronic Signature(s) ?Signed: 03/12/2022 5:44:01 PM By: Redmond PullingPalmer, Carrie RN, BSN ?Entered By: Redmond PullingPalmer, Carrie on 11/30/2022 08:18:33 ?-------------------------------------------------------------------------------- ?Clinic Level of Care Assessment Details ?Patient Name: Date of Service: ?[name] 04/06/2022 8:00 A  M ?Medical Record Number: [id number] ?Patient Account Number: [id number] ?Date of Birth/Sex: Treating RN: ?10/23/1946 [age] y.o. Cline CoolsM) Palmer, Carrie ?Primary Care Brisia Schuermann: Arva ChafeWendling, Nicholas Other Clinician: ?Referring Adonus Uselman: ?Treating Errik Mitchelle/Extender: Duanne Guessannon, Jennifer ?Arva ChafeWendling, Nicholas ?Weeks in Treatment: 0 ?Clinic Level of Care Assessment Items ?TOOL 1 Quantity Score ?X- 1 0 ?Use when EandM and Procedure is performed on INITIAL visit ?ASSESSMENTS - Nursing Assessment / Reassessment ?X- 1 20 ?General Physical Exam (combine w/ comprehensive assessment (listed just below) when performed on new pt. evals) ?X- 1 25 ?Comprehensive Assessment (HX, ROS, Risk Assessments, Wounds Hx, etc.) ?ASSESSMENTS - Wound and Skin Assessment / Reassessment ?[]  - 0 ?Dermatologic / Skin Assessment (not related to wound area) ?ASSESSMENTS - Ostomy and/or Continence Assessment and Care ?[]  - 0 ?Incontinence Assessment and Management ?[]  - 0 ?Ostomy Care Assessment and Management (repouching, etc.) ?PROCESS - Coordination of Care ?[]  - 0 ?Simple Patient / Family Education for ongoing care ?X- 1 20 ?Complex (extensive) Patient / Family Education for ongoing care ?X- 1 10 ?Staff obtains Consents, Records, T Results / Process Orders ?est ?[]  - 0 ?Staff telephones HHA, Nursing Homes / Clarify orders / etc ?[]  - 0 ?Routine Transfer to another Facility (non-emergent condition) ?[]  - 0 ?Routine Hospital Admission (non-emergent condition) ?X- 1 15 ?New Admissions / Manufacturing engineernsurance Authorizations / Ordering NPWT Apligraf, etc. ?, ?[]  - 0 ?Emergency Hospital Admission (emergent condition) ?PROCESS - Special Needs ?[]  - 0 ?Pediatric / Minor Patient Management ?[]  - 0 ?Isolation Patient Management ?[]  - 0 ?Hearing / Language / Visual special needs ?[]  - 0 ?Assessment of Community assistance (transportation, D/C planning, etc.) ?[]  - 0 ?Additional assistance / Altered mentation ?[]  - 0 ?Support Surface(s) Assessment (bed, cushion, seat, etc.) ?INTERVENTIONS  - Miscellaneous ?[]  - 0 ?External ear exam ?[]  - 0 ?Patient Transfer (multiple staff / Nurse, adultHoyer Lift / Similar devices) ?[]  - 0 ?Simple Staple / Suture removal (25 or less) ?[]  - 0 ?Complex Staple / Suture removal (26 or more) ?[]  - 0 ?Hypo/Hyperglycemic Management (do not check if billed separately) ?X- 1 15 ?Ankle / Brachial Index (ABI) - do not check if billed separately ?Has the  patient been seen at the hospital within the last three years: Yes ?Total Score: 105 ?Level Of Care: New/Established - Level 3 ?Electronic Signature(s) ?Signed: 04-07-2022 5:44:01 PM By: Redmond Pulling RN, BSN ?Entered By: Redmond Pulling on 07-Apr-2022 16:43:06 ?-------------------------------------------------------------------------------- ?Compression Therapy Details ?Patient Name: ?Date of Service: ?[name], [name] 2022-04-07 8:00 A M ?Medical Record Number: [id number] ?Patient Account Number: [id number] ?Date of Birth/Sex: ?Treating RN: ?08-25-1946 (76 y.o. Cline Cools ?Primary Care Lyliana Dicenso: Arva Chafe ?Other Clinician: ?Referring Maddeline Roorda: ?Treating Alvia Jablonski/Extender: Duanne Guess ?Arva Chafe ?Weeks in Treatment: 0 ?Compression Therapy Performed for Wound Assessment: Wound #1 Right,Medial Lower Leg ?Performed By: Clinician Redmond Pulling, RN ?Compression Type: Three Layer ?Post Procedure Diagnosis ?Same as Pre-procedure ?Electronic Signature(s) ?Signed: 07-Apr-2022 5:44:01 PM By: Redmond Pulling RN, BSN ?Entered By: Redmond Pulling on April 07, 2022 09:13:02 ?-------------------------------------------------------------------------------- ?Encounter Discharge Information Details ?Patient Name: ?Date of Service: ?[place], [name] 04-07-22 8:00 A M ?Medical Record Number: [id number] ?Patient Account Number: [id number] ?Date of Birth/Sex: ?Treating RN: ?[birth year] (76 y.o. Cline Cools ?Primary Care Laylynn Campanella: Arva Chafe ?Other Clinician: ?Referring Estelene Carmack: ?Treating Tadd Holtmeyer/Extender: Duanne Guess ?Arva Chafe ?Weeks in Treatment: 0 ?Encounter Discharge Information Items Post Procedure Vitals ?Discharge Condition: Stable ?Temperature (F): 98.0 ?Ambulatory Status: Ambulatory ?Pulse (bpm): 92 ?Discharge Destination: Home ?Respiratory Rate (breaths/min): 18 ?Transportation: Private Auto ?Blood Pressure (mmHg): 99/58 ?Accompanied By: daughter ?Schedule Follow-up Appointment: Yes ?Clinical Summary of Care: Patient Declined ?Electronic Signature(s) ?Signed: 2022-04-07 5:44:01 PM By: Redmond Pulling RN, BSN ?Entered By: Redmond Pulling on 04/07/2022 16:47:35 ?-------------------------------------------------------------------------------- ?Lower Extremity Assessment Details ?Patient Name: ?Date of Service: ?[place], [name] 04-07-22 8:00 A M ?Medical Record Number: [id number] ?Patient Account Number: [id number] ?Date of Birth/Sex: ?Treating RN: ?[birth year] (76 y.o. Cline Cools ?Primary Care Iyauna Sing: ?Other Clinician: ?Arva Chafe ?Referring Coalton Arch: ?Treating Harrel Ferrone/Extender: Duanne Guess ?Arva Chafe ?Weeks in Treatment: 0 ?Edema Assessment ?Assessed: [Left: No] [Right: Yes] ?Edema: [Left: N] [Right: o] ?Calf ?Left: Right: ?Point of Measurement: 34 cm From Medial Instep 37 cm ?Ankle ?Left: Right: ?Point of Measurement: 12 cm From Medial Instep 21.2 cm ?Knee To Floor ?Left: Right: ?From Medial Instep 44 cm ?Vascular Assessment ?Pulses: ?Dorsalis Pedis ?Palpable: [Right:Yes] ?Blood Pressure: ?Brachial: [Right:98] ?Ankle: ?[Right:Dorsalis Pedis: 102 1.04] ?Electronic Signature(s) ?Signed: April 07, 2022 5:44:01 PM By: Redmond Pulling RN, BSN ?Entered By: Redmond Pulling on 2022-04-07 08:59:15 ?-------------------------------------------------------------------------------- ?Multi Wound Chart Details ?Patient Name: ?Date of Service: ?[place], [name] April 07, 2022 8:00 A M ?Medical Record Number: [id number] ?Patient Account Number: [id number] ?Date of Birth/Sex: ?Treating RN: ?03-23-1946 (76 y.o. Judie Petit) Karie Schwalbe ?Primary Care Evani Shrider: Arva Chafe ?Other Clinician: ?Referring Lief Palmatier: ?Treating Ibraham Levi/Extender: Duanne Guess ?Arva Chafe ?Weeks in Treatment: 0 ?Vital Signs ?Height(in): 70 ?Puls

## 2022-04-09 DEATH — deceased

## 2022-04-23 ENCOUNTER — Ambulatory Visit: Payer: Medicare Other | Admitting: Family

## 2022-04-23 ENCOUNTER — Other Ambulatory Visit: Payer: Medicare Other
